# Patient Record
Sex: Female | Born: 1960 | Race: Black or African American | Hispanic: No | Smoking: Former smoker
Health system: Southern US, Community
[De-identification: ages and names within clinical notes are randomized; demographics above are authoritative.]

## PROBLEM LIST (undated history)

## (undated) DIAGNOSIS — I10 Essential (primary) hypertension: Secondary | ICD-10-CM

## (undated) DIAGNOSIS — E785 Hyperlipidemia, unspecified: Secondary | ICD-10-CM

## (undated) DIAGNOSIS — IMO0002 Reserved for concepts with insufficient information to code with codable children: Secondary | ICD-10-CM

## (undated) DIAGNOSIS — E041 Nontoxic single thyroid nodule: Secondary | ICD-10-CM

## (undated) DIAGNOSIS — E119 Type 2 diabetes mellitus without complications: Secondary | ICD-10-CM

## (undated) DIAGNOSIS — M329 Systemic lupus erythematosus, unspecified: Secondary | ICD-10-CM

## (undated) HISTORY — DX: Reserved for concepts with insufficient information to code with codable children: IMO0002

## (undated) HISTORY — DX: Systemic lupus erythematosus, unspecified: M32.9

## (undated) HISTORY — DX: Nontoxic single thyroid nodule: E04.1

## (undated) HISTORY — DX: Essential (primary) hypertension: I10

## (undated) HISTORY — DX: Type 2 diabetes mellitus without complications: E11.9

## (undated) HISTORY — DX: Hyperlipidemia, unspecified: E78.5

## (undated) HISTORY — PX: ABDOMINAL HYSTERECTOMY: SHX81

---

## 1997-11-03 ENCOUNTER — Other Ambulatory Visit: Admission: RE | Admit: 1997-11-03 | Discharge: 1997-11-03 | Payer: Self-pay | Admitting: Obstetrics and Gynecology

## 1998-09-01 ENCOUNTER — Ambulatory Visit (HOSPITAL_COMMUNITY): Admission: RE | Admit: 1998-09-01 | Discharge: 1998-09-01 | Payer: Self-pay | Admitting: Gastroenterology

## 1999-12-10 ENCOUNTER — Other Ambulatory Visit: Admission: RE | Admit: 1999-12-10 | Discharge: 1999-12-10 | Payer: Self-pay | Admitting: Obstetrics and Gynecology

## 1999-12-15 ENCOUNTER — Other Ambulatory Visit: Admission: RE | Admit: 1999-12-15 | Discharge: 1999-12-15 | Payer: Self-pay | Admitting: Obstetrics and Gynecology

## 1999-12-15 ENCOUNTER — Encounter (INDEPENDENT_AMBULATORY_CARE_PROVIDER_SITE_OTHER): Payer: Self-pay

## 2000-01-05 ENCOUNTER — Encounter (INDEPENDENT_AMBULATORY_CARE_PROVIDER_SITE_OTHER): Payer: Self-pay

## 2000-01-05 ENCOUNTER — Ambulatory Visit (HOSPITAL_COMMUNITY): Admission: RE | Admit: 2000-01-05 | Discharge: 2000-01-05 | Payer: Self-pay | Admitting: Obstetrics and Gynecology

## 2000-07-03 ENCOUNTER — Inpatient Hospital Stay (HOSPITAL_COMMUNITY): Admission: RE | Admit: 2000-07-03 | Discharge: 2000-07-06 | Payer: Self-pay | Admitting: Obstetrics and Gynecology

## 2000-07-03 ENCOUNTER — Encounter (INDEPENDENT_AMBULATORY_CARE_PROVIDER_SITE_OTHER): Payer: Self-pay | Admitting: Specialist

## 2000-07-05 ENCOUNTER — Encounter: Payer: Self-pay | Admitting: Obstetrics and Gynecology

## 2009-07-25 HISTORY — PX: SHOULDER SURGERY: SHX246

## 2010-03-25 DIAGNOSIS — E041 Nontoxic single thyroid nodule: Secondary | ICD-10-CM

## 2010-03-25 HISTORY — DX: Nontoxic single thyroid nodule: E04.1

## 2010-04-13 ENCOUNTER — Encounter: Admission: RE | Admit: 2010-04-13 | Discharge: 2010-04-13 | Payer: Self-pay | Admitting: Internal Medicine

## 2010-05-25 ENCOUNTER — Ambulatory Visit (HOSPITAL_COMMUNITY): Admission: RE | Admit: 2010-05-25 | Discharge: 2010-05-25 | Payer: Self-pay | Admitting: General Surgery

## 2010-10-06 LAB — DIFFERENTIAL
Basophils Absolute: 0 10*3/uL (ref 0.0–0.1)
Basophils Relative: 1 % (ref 0–1)
Eosinophils Absolute: 0.1 10*3/uL (ref 0.0–0.7)
Eosinophils Relative: 2 % (ref 0–5)
Lymphocytes Relative: 37 % (ref 12–46)
Lymphs Abs: 2.9 10*3/uL (ref 0.7–4.0)
Monocytes Absolute: 0.7 10*3/uL (ref 0.1–1.0)
Monocytes Relative: 9 % (ref 3–12)
Neutro Abs: 3.9 10*3/uL (ref 1.7–7.7)
Neutrophils Relative %: 51 % (ref 43–77)

## 2010-10-06 LAB — SURGICAL PCR SCREEN
MRSA, PCR: NEGATIVE
Staphylococcus aureus: NEGATIVE

## 2010-10-06 LAB — BASIC METABOLIC PANEL
BUN: 17 mg/dL (ref 6–23)
CO2: 33 mEq/L — ABNORMAL HIGH (ref 19–32)
Calcium: 9.6 mg/dL (ref 8.4–10.5)
Chloride: 97 mEq/L (ref 96–112)
Creatinine, Ser: 0.67 mg/dL (ref 0.4–1.2)
GFR calc Af Amer: >60 mL/min (ref >60)
GFR calc non Af Amer: >60 mL/min (ref >60)
Glucose, Bld: 109 mg/dL — ABNORMAL HIGH (ref 70–99)
Potassium: 3.4 mEq/L — ABNORMAL LOW (ref 3.5–5.1)
Sodium: 138 mEq/L (ref 135–145)

## 2010-10-06 LAB — CBC
HCT: 43.2 % (ref 36.0–46.0)
Hemoglobin: 15.1 g/dL — ABNORMAL HIGH (ref 12.0–15.0)
MCH: 35 pg — ABNORMAL HIGH (ref 26.0–34.0)
MCHC: 35 g/dL (ref 30.0–36.0)
MCV: 99.8 fL (ref 78.0–100.0)
Platelets: 331 10*3/uL (ref 150–400)
RBC: 4.32 MIL/uL (ref 3.87–5.11)
RDW: 13.1 % (ref 11.5–15.5)
WBC: 7.7 10*3/uL (ref 4.0–10.5)

## 2010-10-21 ENCOUNTER — Other Ambulatory Visit: Payer: Self-pay | Admitting: Internal Medicine

## 2010-10-21 DIAGNOSIS — E042 Nontoxic multinodular goiter: Secondary | ICD-10-CM

## 2010-12-10 NOTE — Op Note (Signed)
[facility]  Patient:    Alyssa Rhodes, Alyssa Rhodes                         MRN: [id number] Proc. Date: 01/05/00 Adm. Date:  [id number] Disc. Date: [id number] Attending:  Wandalee Ferdinand                           Operative Report  PREOPERATIVE DIAGNOSIS:       Uterine polyp versus fibroid.  Fibroid uterus. History of endometriosis.  Dysmenorrhea and menorrhagia.  POSTOPERATIVE DIAGNOSIS:      Multiple submucosal uterine fibroids.  OPERATION:                    Diagnostic/operative hysteroscopy with multiple myomectomy.  Dilatation and curettage.  SURGEON:                      Rudy Jew. Ashley Royalty, M.D.  ASSISTANT:  ANESTHESIA:                   General anesthesia.  ESTIMATED BLOOD LOSS:         50 cc.  COMPLICATIONS:                None.  PACKS AND DRAINS:             None.  DEFICIT:                      200 cc.  DESCRIPTION OF PROCEDURE:     The patient was taken to the operating room and placed in the dorsal supine position.  After adequate general endotracheal anesthesia was administered, she was placed in the lithotomy position and prepped and draped in the usual fashion for vaginal surgery.  A posterior weighted retractor was placed per vagina.  The anterior lip of the cervix was grasped with a single tooth tenaculum.  The uterus was gently sounded to approximately 8 cm. he cervix was then serially dilated to a size 29 [place] using [employer] dilators.  The  resectoscope was placed using Sorbitol as a distention medium.  The uterine cavity was thoroughly surveyed.  The tubal ostia was visualized.  The upper aspect of he uterus and fundus were without evidence of any submucosal fibroids or polyps. However, inferiorly, there were at least three fibroids noted.  One was on the posterior aspect of the lower uterine segment.  The other two were on the left nd right aspects of the lower uterine segment respectively.  Using the resectoscope at 110  watts cutting, 80 watts coagulation, waveform and the double right angle loupe, the fibroids were removed to a level flush with the remainder of the uterine cavity.  The specimens were submitted separately to pathology for histologic studies.  Hemostasis was obtained with the coagulation  waveform.  Attention was then turned to the dilatation and curettage.  Using a medium size  curet, the uterine cavity was successfully curetted.  First a four quadrant technique was used.  Then a therapeutic technique was used.  The resectoscope was once again placed.  Final attempts to maximize hemostasis ere conducted with the coagulation waveform.  Some technical difficulty was encountered with the instrument where the instrument had to be cleansed several times from water getting on the camera and on the operators side of the hysteroscope. However, these obstacles were fairly successfully overcome.  Hemostasis  was obtained and the procedure terminated.  It should also be mentioned during the course of the procedure that a lot of fluid was noted on the floor and it was finally established that approximately 200 cc fluid deficit was probably a best  estimate.  The patient was taken to the recovery room in excellent condition. DD:  01/05/00 TD:  01/08/00 Job: [id number]

## 2010-12-10 NOTE — Discharge Summary (Signed)
[facility] [place]  Patient:    Alyssa Rhodes, Alyssa Rhodes                         MRN: [id number] Adm. Date:  [id number] Disc. Date: [id number] Attending:  Wandalee Ferdinand                           Discharge Summary  DISCHARGE DIAGNOSES:          1. Fibroid uterus.                               2. Abnormal uterine bleeding refractory to                                  medical and surgical conservative management.  OPERATIONS/PROCEDURES:        Total abdominal hysterectomy.  CONSULTATIONS:                None.  DISCHARGE MEDICATIONS:        1. Tylox #20 one p.o. q.3-4h. p.r.n. pain.                               2. Ampicillin 500 mg p.o. q.i.d. x 4 days.  HISTORY AND PHYSICAL:         This is a 50 year old gravida 2, para 2 with a known fibroid uterus and refractory abnormal uterine bleeding dating back to May of 2001. The patient was admitted for hysterectomy.  For the remainder of the history and physical, please see the chart.  HOSPITAL COURSE:              The patient was admitted to [facility] [place].  Admission laboratory studies were drawn.  On July 03, 2000, she was taken to the operating room and underwent total abdominal hysterectomy.  The procedure was uncomplicated.  Postoperative course was similarly uncomplicated except for a low grade temperature of approximately 101.2 degrees F. on July 05, 2000.  Work-up was negative and the patient felt to have perhaps ___________ cellulitis.  On July 06, 2000, she was afebrile and felt stable for discharge home on p.o. ampicillin.  She was discharged home on that date afebrile and in satisfactory condition.  ACCESSORY CLINICAL FINDINGS:  Hemoglobin and hematocrit on admission were 13.2 and 38.4, respectively.  Repeat values obtained July 04, 2000, were 10.5 and 30.3, respectively.  White blood cell count on that date was 15,000. Repeat white blood cell count one day later revealed  a value of only 10,200. Serum pregnancy test was negative.  Urinalysis was essentially unremarkable. Type and Rh revealed B positive blood.  DISPOSITION:                  The patient was to return to [facility] in four to six weeks for postoperative evaluation. DD:  08/15/00 TD:  08/15/00 Job: [id number]

## 2010-12-10 NOTE — H&P (Signed)
[facility]  Patient:    Alyssa Rhodes, Alyssa Rhodes                           MRN: [id number] Adm. Date:  01/05/00 Attending:  Fayrene Fearing A. Ashley Royalty, M.D.                         History and Physical  HISTORY OF PRESENT ILLNESS:       This is a 50 year old, gravida 2, para 2, who presented Dec 10, 1999, complaining of dysmenorrhea and menorrhagia for three to four months duration.  Also, the patient complained of a six-month history of intermittent left lower quadrant discomfort.  She has a know fibroid uterus and history of endometriosis diagnosed a the time of laparoscopy December 1998.  A large adhesion was lysed at that time as well.                                    The patient returned to the office Dec 14, 1999, at which time she underwent endometrial biopsy as well as sonohistogram.  The sonohistogram revealed a large polyp arising in the lower uterine segment measuring approximately 2.8 cm in greatest diameter.  Secondly, there was a fibroid measuring less than 2 cm in greatest diameter arising from the left fundal aspect of the uterus.  The endometrial biopsy was negative.  The patient is admitted for diagnostic/operative hysteroscopy and D&C.  MEDICATIONS:                      None.  PAST MEDICAL HISTORY:             Medical: Negative.                                   Surgical:                                   1. Diagnostic/operative laparoscopy.                                      for endometriosis December 1998.                                   2. ______.  ALLERGIES:                        None.  FAMILY HISTORY:                   Noncontributory.  SOCIAL HISTORY:                   The patient smokes approximately one-half pack of cigarettes per day.  No significant alcohol usage.  REVIEW OF SYSTEMS:                Noncontributory.  PHYSICAL EXAMINATION:  GENERAL:                          Well-developed, well-nourished, pleasant black female  in no acute distress.  VITAL SIGNS:                      Stable.  SKIN:                             Warm and dry without lesions.  LYMPH:                            There are no supraclavicular, cervical or inguinal adenopathy.  HEENT:                            Normocephalic.  NECK:                             Supple without thyromegaly.  CHEST:                            Lungs are clear.  CARDIAC:                          Regular rate and rhythm without murmurs, gallops, or rubs.  BREASTS:                          Exam deferred.  ABDOMEN:                          Soft, nontender, without masses or organomegaly.  Bowel sounds are active.  MUSCULOSKELETAL:                  Full range of motion without edema, cyanosis, or CVA tenderness.  PELVIC:                           Examination deferred until examination under anesthesia.  IMPRESSION:                       1. Fibroid uterus.                                   2. History of endometriosis.                                   3. Dysmenorrhea and menorrhagia, possibly                                      secondary to #1.                                   4. Intrauterine polyp and possibly                                      separate fibroid at sonohistogram.  5. Intermittent left lower quadrant                                      discomfort, possibly secondary to #1                                      or #4.  PLAN:                             Diagnostic/operative hysteroscopy, dilatation and curettage.  Risks, benefits, alternatives were fully discussed with the patient.  The patient states she understands and accepts.  Questions invited and answered.DD:  01/05/00 TD:  01/05/00 Job: [id number]

## 2010-12-10 NOTE — H&P (Signed)
[facility]  Patient:    Alyssa Rhodes, Alyssa Rhodes                           MRN: [id number] Adm. Date:  07/03/00 Attending:  Fayrene Fearing A. Ashley Royalty, M.D.                         History and Physical  HISTORY OF PRESENT ILLNESS:   This is a 50 year old, gravida 2, para 2 with a known fibroid uterus and refractory abnormal uterine bleeding dating back to May 2001. The patient has been treated with sonohysterogram, endometrial biopsy, diagnostic/operative hysteroscopy, dilatation and curettage along with Megace therapy without relief. She desires no additional children and would like definitive therapy in the form of abdominal hysterectomy. Ultrasound was obtained Dec 14, 1999 and revealed the uterus to be approximately 9 x 7 x 6 cm with multiple small uterine fibroids.  MEDICATIONS:                  Megace.  PAST MEDICAL HISTORY:         Medical negative.  PAST SURGICAL HISTORY:        1. Diagnostic laparoscopy, 1998 with                                  endometriosis.                               2. Bilateral tubal sterilization.                               3. Cesarean section.  ALLERGIES:                    None.  FAMILY HISTORY:               Noncontributory.  SOCIAL HISTORY:               The patient smokes 10 cigarettes per day. The patient denies significant alcohol.  REVIEW OF SYSTEMS:            Noncontributory.  PHYSICAL EXAMINATION:  GENERAL:                      Well-developed, well-nourished, pleasant, black female in no acute distress.  VITAL SIGNS:                  Afebrile, Vital signs stable.  SKIN:                         Warm and dry without lesions.  ______:                      There is supraclavicular, cervical or inguinal adenopathy.  HEENT:                        Normocephalic.  NECK:                         Supple without thyromegaly.  CHEST:  Lungs are clear.  CARDIAC:                      Regular  rate and rhythm without murmur, gallop or rub.  BREASTS:                      Deferred.  ABDOMEN:                      Soft, nontender without masses or organomegaly. Bowel sounds are active.  MUSCULOSKELETAL:              Revealed ______ space without edema, cyanosis or CVA tenderness.  PELVIC:                       Deferred under examination under anesthesia.  IMPRESSION:                   1. Fibroid uterus.                               2. Abnormal uterine bleeding refractory to                                  medical and conservative surgical management.                               3. Desire for no additional children.  PLAN:                         Total abdominal hysterectomy. The risks, benefits, complications and alternatives were discussed with the patient. Possible need for unilateral or bilateral salpingo-oophorectomy was discussed and accepted. In the latter event, the patient understands she would need to take hormone replacement therapy potentially for the indefinite future and agrees to same. Questions were answered. DD:  07/03/00 TD:  07/03/00 Job: [id number]

## 2010-12-10 NOTE — Op Note (Signed)
[facility]  Patient:    Alyssa Rhodes, Alyssa Rhodes                           MRN: [id number] Proc. Date: 07/03/00 Attending:  Fayrene Fearing A. Ashley Royalty, M.D.                           Operative Report  PREOPERATIVE DIAGNOSES:       1. Fibroid uterus.                               2. Endometriosis.                               3. Abnormal uterine bleeding - refractory to                                  medical and conservative surgical management.  POSTOPERATIVE DIAGNOSES:      1. Fibroid uterus.                               2. Endometriosis.                               3. Abnormal uterine bleeding - refractory to                                  medical and conservative surgical management.                               4. Pathology pending.  OPERATION:                    Total abdominal hysterectomy.  SURGEON:                      Rudy Jew. Ashley Royalty, M.D.  ASSISTANT:                    Georgina Peer, M.D.  ANESTHESIA:                   General anesthesia.  ESTIMATED BLOOD LOSS:         100 cc.  COMPLICATIONS:                None.  PACKS AND DRAINS:             Foley catheter.  COUNTS:                       Sponge, needle and instrument counts were                               reported as correct x 2.  DESCRIPTION OF PROCEDURE:     The patient was taken to the operating room and placed in the dorsal supine position.  After adequate general anesthesia was administered, she was prepped and draped in the usual manner for abdominal  and vaginal surgery.  A Pfannenstiel incision was made through the patients old skin incision.  The subcutaneous tissues were sharply dissected down to the fascia. The fascia was incised with the knife and extended laterally with Mayo scissors.  The peritoneum was elevated and entered atraumatically with Metzenbaum scissors.  The incision was extended longitudinally.  There were some omental adhesions to the anterior abdominal  wall.  These were isolated, clamped, cut and secured with #1 chromic catgut with free ties.  The upper abdomen was explored.  The kidneys were normal bilaterally.  The liver surface was smooth.  There was no para-aortic adenopathy.  Balfour retractor was then placed.  The uterus was identified.  It was multinodular, numerous fibroids noted subserosally.  The fallopian tubes and ovaries were normal bilaterally. There was a small focus of endometriosis on the posterior left uterosacral ligament area.  The upper abdomen was packed off.  The round ligaments were then clamped, cut and secured with #1 chromic catgut.  A bladder flap was created anteriorly by incising the anterior uterine serosa and sharply and bluntly dissecting the bladder inferiorly.  The fallopian tubes, utero-ovarian ligament and utero-ovarian anastomosis was identified bilaterally, clamped, cut and secured with #1 chromic catgut, leaving the ovaries and fallopian tubes in situ.  The uterine vessels were then skeletonized.  The uterine vessels were then clamped, cut and doubly secured with #1 chromic catgut bilaterally.  Straight Heaney clamps were used to clamp on either side of the cervix paying careful attention to avoid injury to the urinary tract.  Each pedicle was clamped, cut and secured with #1 chromic catgut.  The uterosacral ligaments were isolated bilaterally, clamped, cut and secured with #1 chromic catgut. The pedicles were held.  The vagina was entered and the cervix excised with Satinsky scissors. The entire specimen of uterus and cervix was submitted in toto to pathology for histologic studies.  Next, a running locking circumferential suture of 2-0 Vicryl was placed on the vagina to provide hemostasis to the cuff.  Richardson angle sutures were placed with 0 chromic on either angle.  Hemostasis was noted. One additional suture was placed in the midline to obliterate any possibility of herniation.  Hemostasis  was noted.  The ureters were noted bilaterally to be well below the plane of dissection. Copious irrigation was accomplished.  Hemostasis was noted. The packs were removed.  Terressa Koyanagi was removed.  The fascia was then closed with 0 Vicryl in a running fashion.  The skin was closed with staples.  The patient tolerated the procedure extremely well.  At the conclusion of the procedure the urine was clear and copious. DD:  07/03/00 TD:  07/03/00 Job: [id number]

## 2011-04-21 ENCOUNTER — Other Ambulatory Visit: Payer: Self-pay | Admitting: Internal Medicine

## 2011-04-21 DIAGNOSIS — E041 Nontoxic single thyroid nodule: Secondary | ICD-10-CM

## 2011-04-27 ENCOUNTER — Other Ambulatory Visit: Payer: Self-pay

## 2011-05-04 ENCOUNTER — Other Ambulatory Visit: Payer: Self-pay

## 2011-05-04 ENCOUNTER — Ambulatory Visit
Admission: RE | Admit: 2011-05-04 | Discharge: 2011-05-04 | Disposition: A | Discharge status: Home / Self Care | Payer: BC Managed Care – PPO | Source: Ambulatory Visit | Attending: Internal Medicine | Admitting: Internal Medicine

## 2011-05-04 DIAGNOSIS — E041 Nontoxic single thyroid nodule: Secondary | ICD-10-CM

## 2011-05-09 ENCOUNTER — Other Ambulatory Visit: Payer: Self-pay | Admitting: Internal Medicine

## 2011-05-09 DIAGNOSIS — E041 Nontoxic single thyroid nodule: Secondary | ICD-10-CM

## 2011-05-18 ENCOUNTER — Other Ambulatory Visit (HOSPITAL_COMMUNITY)
Admission: RE | Admit: 2011-05-18 | Discharge: 2011-05-18 | Disposition: A | Discharge status: Home / Self Care | Payer: BC Managed Care – PPO | Source: Ambulatory Visit | Attending: Interventional Radiology | Admitting: Interventional Radiology

## 2011-05-18 ENCOUNTER — Ambulatory Visit
Admission: RE | Admit: 2011-05-18 | Discharge: 2011-05-18 | Disposition: A | Discharge status: Home / Self Care | Payer: BC Managed Care – PPO | Source: Ambulatory Visit | Attending: Internal Medicine | Admitting: Internal Medicine

## 2011-05-18 DIAGNOSIS — E041 Nontoxic single thyroid nodule: Secondary | ICD-10-CM

## 2011-05-18 DIAGNOSIS — E049 Nontoxic goiter, unspecified: Secondary | ICD-10-CM | POA: Insufficient documentation

## 2011-08-08 ENCOUNTER — Encounter: Payer: Self-pay | Admitting: Internal Medicine

## 2011-09-12 ENCOUNTER — Other Ambulatory Visit: Payer: Self-pay | Admitting: Family Medicine

## 2011-09-12 MED ORDER — BISOPROLOL-HYDROCHLOROTHIAZIDE 5-6.25 MG PO TABS: 1.0000 | ORAL_TABLET | Freq: Every day | ORAL | Status: DC

## 2011-10-05 ENCOUNTER — Ambulatory Visit: Payer: Self-pay | Admitting: Internal Medicine

## 2011-10-06 ENCOUNTER — Other Ambulatory Visit: Payer: Self-pay | Admitting: *Deleted

## 2011-10-06 MED ORDER — CLONIDINE HCL 0.1 MG PO TABS: 0.1000 mg | ORAL_TABLET | Freq: Two times a day (BID) | ORAL | Status: DC

## 2011-10-11 ENCOUNTER — Ambulatory Visit: Payer: Self-pay | Admitting: Family Medicine

## 2011-10-12 ENCOUNTER — Ambulatory Visit: Payer: Self-pay | Admitting: Family Medicine

## 2011-10-18 ENCOUNTER — Encounter: Payer: Self-pay | Admitting: Family Medicine

## 2011-10-18 ENCOUNTER — Ambulatory Visit (INDEPENDENT_AMBULATORY_CARE_PROVIDER_SITE_OTHER): Payer: BC Managed Care – PPO | Admitting: Family Medicine

## 2011-10-18 VITALS — BP 138/84 | HR 73 | Temp 97.3°F | Resp 16 | Ht 61.5 in | Wt 118.6 lb

## 2011-10-18 DIAGNOSIS — Z720 Tobacco use: Secondary | ICD-10-CM

## 2011-10-18 DIAGNOSIS — Z13220 Encounter for screening for lipoid disorders: Secondary | ICD-10-CM

## 2011-10-18 DIAGNOSIS — F17200 Nicotine dependence, unspecified, uncomplicated: Secondary | ICD-10-CM

## 2011-10-18 DIAGNOSIS — I10 Essential (primary) hypertension: Secondary | ICD-10-CM | POA: Insufficient documentation

## 2011-10-18 MED ORDER — LISINOPRIL 20 MG PO TABS: 20.0000 mg | ORAL_TABLET | Freq: Every day | ORAL | Status: DC

## 2011-10-18 MED ORDER — BISOPROLOL-HYDROCHLOROTHIAZIDE 5-6.25 MG PO TABS: 1.0000 | ORAL_TABLET | Freq: Every day | ORAL | Status: DC

## 2011-10-18 MED ORDER — CLONIDINE HCL 0.1 MG PO TABS: 0.1000 mg | ORAL_TABLET | Freq: Two times a day (BID) | ORAL | Status: DC

## 2011-10-18 NOTE — Patient Instructions (Addendum)

## 2011-10-19 ENCOUNTER — Encounter: Payer: Self-pay | Admitting: Family Medicine

## 2011-10-19 NOTE — Progress Notes (Signed)
  Subjective:    Patient ID: Alyssa Rhodes, female    DOB: July 30, 1960, 51 y.o.   MRN: [id number]  HPI This 51 y.o.AA female has well-controlled HTN and is here for follow-up and medication refills.  She denies any medication side effects, chest pain, palpitations, dizziness or lightheadedness.  She was prescribed Clonidine in November 2012 for treatment of peri-menopausal symptoms and  she notes less problems with hot flashes. She continues to smoke despite having a history of emphysema.  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 Labs done in   September 2012 (BMET, TFTs) were normal.                                                                                                                                                                                 .Review of Systems  Constitutional: Negative.   Eyes: Negative for visual disturbance.  Respiratory: Positive for shortness of breath. Negative for cough, chest tightness and wheezing.   Cardiovascular: Negative for chest pain and palpitations.  Neurological: Negative for dizziness, weakness, light-headedness and headaches.  All other systems reviewed and are negative.          Objective:   Physical Exam  Vitals reviewed. Constitutional: She is oriented to person, place, and time. She appears well-developed and well-nourished. No distress.  HENT:  Head: Normocephalic and atraumatic.  Eyes: EOM are normal. No scleral icterus.  Cardiovascular: Normal rate and regular rhythm.   Pulmonary/Chest: Effort normal. No respiratory distress.  Neurological: She is alert and oriented to person, place, and time. No cranial nerve deficit.  Psychiatric: She has a normal mood and affect. Her behavior is normal. Judgment and thought content normal.          Assessment & Plan:   1. HTN (hypertension)  Comprehensive metabolic panel Continue current medications- RFs for Generic Ziac 5-6.25 mg,  Clonidine 0.1 mg and lisinopril 20 mg (all 90- Day supply with RFs)  2. Need for lipid screening  Lipid panel  3. Tobacco user  Pt not interested in cessation at this time

## 2011-10-30 IMAGING — US US THYROID BIOPSY
1 series · 6 of 6 positions shown · non-contrast
Comparison: Thyroid ultrasound dated 05/04/2011

CLINICAL DATA: Solid right thyroid nodule measuring 1.2 cm in
diameter and containing microcalcifications.

ULTRASOUND GUIDED NEEDLE ASPIRATE BIOPSY OF THE THYROID GLAND

[Series 1: us thyroid biopsy · 0.05mm/px · 6 acquisitions, 6 frames shown]
[im 1/6]
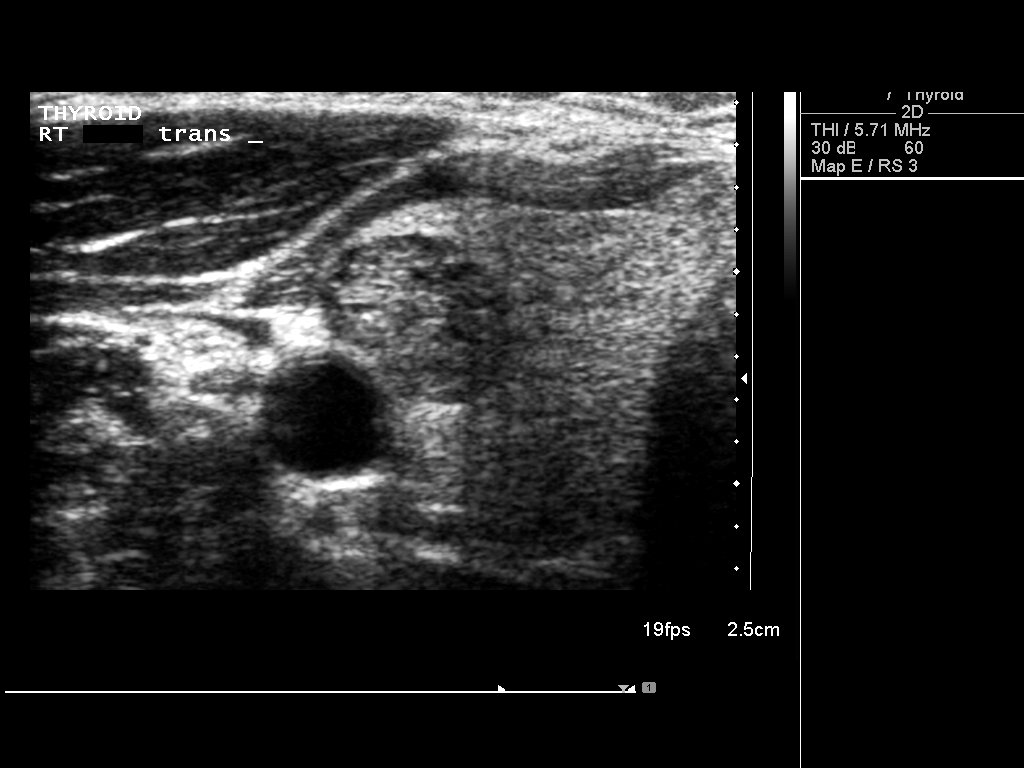
[im 2/6]
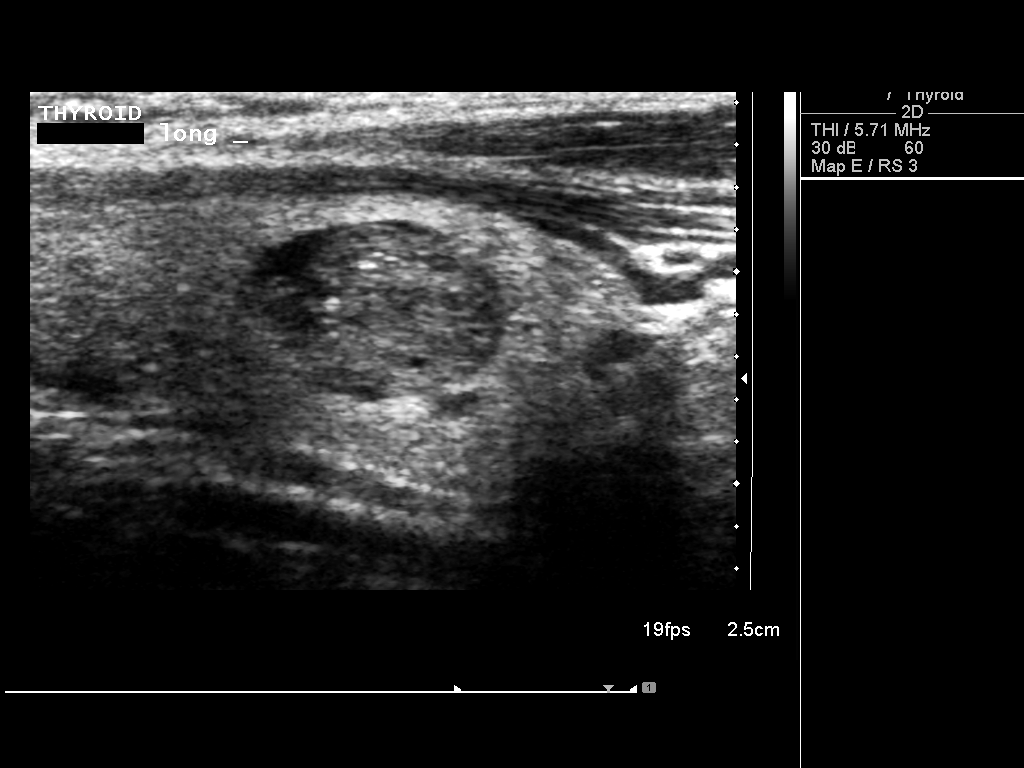
[im 3/6]
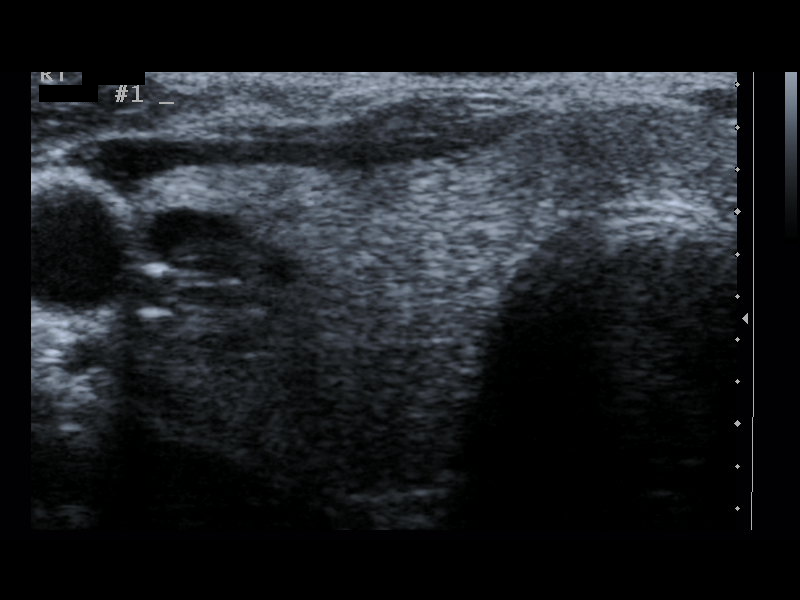
[im 4/6]
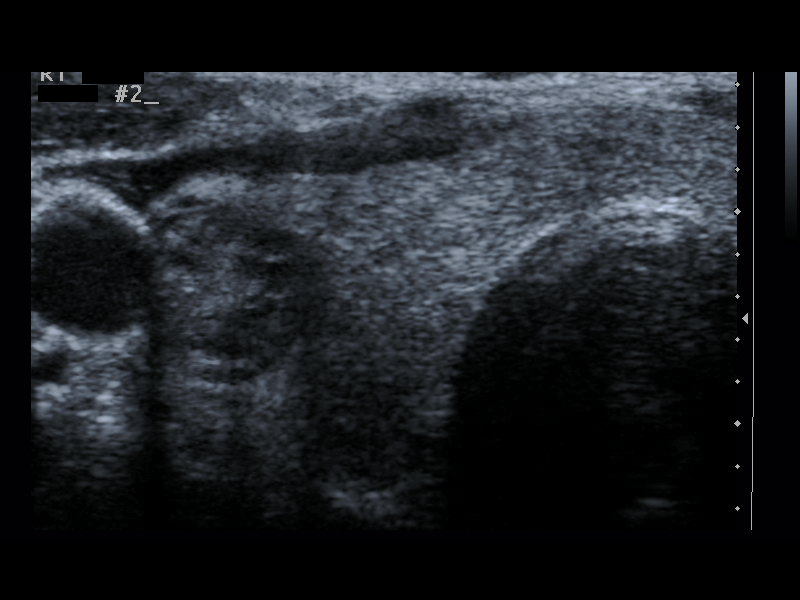
[im 5/6]
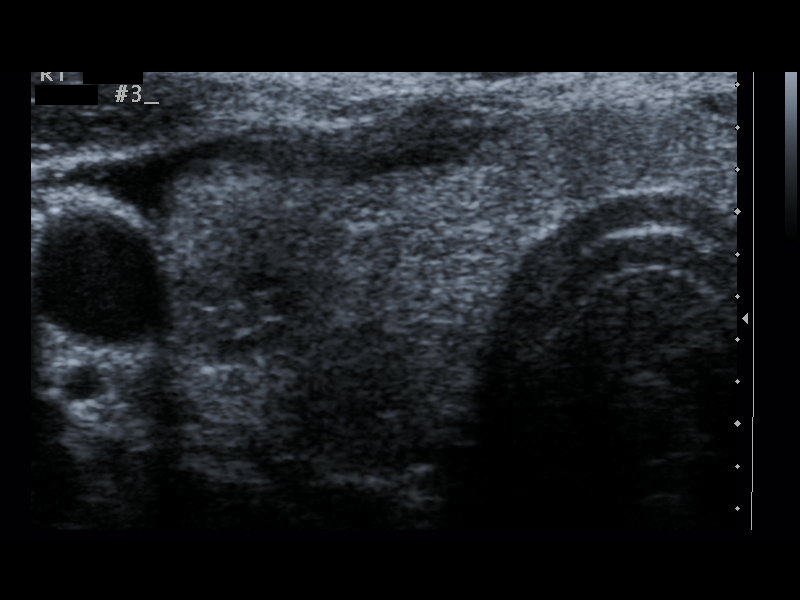
[im 6/6]
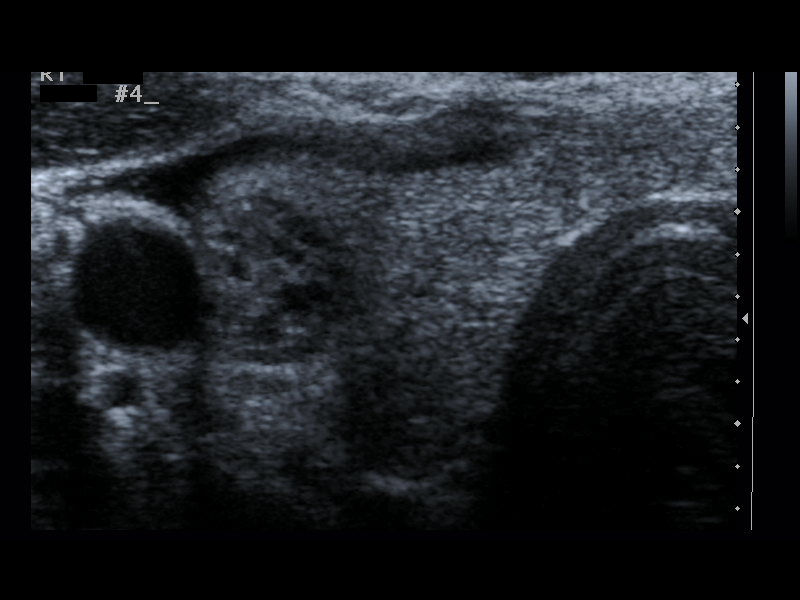

[6 of 6 positions shown; findings below may reference images not displayed]

Thyroid biopsy was thoroughly discussed with the patient and
questions were answered.  The benefits, risks, alternatives, and
complications were also discussed.  The patient understands and
wishes to proceed with the procedure.  Written consent was
obtained.

Ultrasound was performed to localize and mark an adequate site for
the biopsy.  The patient was then prepped and draped in a normal
sterile fashion.  Local anesthesia was provided with 1% lidocaine.
Using direct ultrasound guidance, 4 passes were made using 25 gauge
needles into the nodule within the right lobe of the thyroid.
Ultrasound was used to confirm needle placements on all occasions.
Specimens were sent to Pathology for analysis.

Complications:  None
FINDINGS: Needle samples were obtained directly in the focal right
thyroid nodule.
IMPRESSION: Ultrasound guided needle aspirate biopsy performed of the right
thyroid nodule.

## 2012-06-05 ENCOUNTER — Other Ambulatory Visit: Payer: Self-pay | Admitting: *Deleted

## 2012-06-05 MED ORDER — LISINOPRIL 20 MG PO TABS: 20.0000 mg | ORAL_TABLET | Freq: Every day | ORAL | Status: DC

## 2012-06-05 MED ORDER — BISOPROLOL-HYDROCHLOROTHIAZIDE 5-6.25 MG PO TABS: 1.0000 | ORAL_TABLET | Freq: Every day | ORAL | Status: DC

## 2012-09-25 ENCOUNTER — Other Ambulatory Visit: Payer: Self-pay | Admitting: Physician Assistant

## 2012-09-25 NOTE — Telephone Encounter (Signed)
Last seen March 2013, needs ov labs

## 2012-10-01 ENCOUNTER — Other Ambulatory Visit: Payer: Self-pay | Admitting: Physician Assistant

## 2012-10-31 ENCOUNTER — Other Ambulatory Visit: Payer: Self-pay | Admitting: Physician Assistant

## 2013-01-22 ENCOUNTER — Ambulatory Visit: Payer: BC Managed Care – PPO | Admitting: Family Medicine

## 2013-10-05 ENCOUNTER — Ambulatory Visit (INDEPENDENT_AMBULATORY_CARE_PROVIDER_SITE_OTHER): Payer: BC Managed Care – PPO | Admitting: Physician Assistant

## 2013-10-05 VITALS — BP 140/78 | HR 91 | Temp 98.5°F | Resp 17 | Ht 62.25 in | Wt 125.5 lb

## 2013-10-05 DIAGNOSIS — T485X5A Adverse effect of other anti-common-cold drugs, initial encounter: Secondary | ICD-10-CM

## 2013-10-05 DIAGNOSIS — J310 Chronic rhinitis: Secondary | ICD-10-CM

## 2013-10-05 DIAGNOSIS — J329 Chronic sinusitis, unspecified: Secondary | ICD-10-CM

## 2013-10-05 MED ORDER — AMOXICILLIN 875 MG PO TABS: 1750.0000 mg | ORAL_TABLET | Freq: Two times a day (BID) | ORAL | Status: DC

## 2013-10-05 MED ORDER — IPRATROPIUM BROMIDE 0.03 % NA SOLN: 2.0000 | Freq: Two times a day (BID) | NASAL | Status: DC

## 2013-10-05 NOTE — Patient Instructions (Signed)
STOP using afrin.  Use claritin or zyrtec daily.  Take mucinex 12 hour per box directions.  Sinusitis Sinusitis is redness, soreness, and swelling (inflammation) of the paranasal sinuses. Paranasal sinuses are air pockets within the bones of your face (beneath the eyes, the middle of the forehead, or above the eyes). In healthy paranasal sinuses, mucus is able to drain out, and air is able to circulate through them by way of your nose. However, when your paranasal sinuses are inflamed, mucus and air can become trapped. This can allow bacteria and other germs to grow and cause infection. Sinusitis can develop quickly and last only a short time (acute) or continue over a long period (chronic). Sinusitis that lasts for more than 12 weeks is considered chronic.  CAUSES  Causes of sinusitis include:  Allergies.  Structural abnormalities, such as displacement of the cartilage that separates your nostrils (deviated septum), which can decrease the air flow through your nose and sinuses and affect sinus drainage.  Functional abnormalities, such as when the small hairs (cilia) that line your sinuses and help remove mucus do not work properly or are not present. SYMPTOMS  Symptoms of acute and chronic sinusitis are the same. The primary symptoms are pain and pressure around the affected sinuses. Other symptoms include:  Upper toothache.  Earache.  Headache.  Bad breath.  Decreased sense of smell and taste.  A cough, which worsens when you are lying flat.  Fatigue.  Fever.  Thick drainage from your nose, which often is green and may contain pus (purulent).  Swelling and warmth over the affected sinuses. DIAGNOSIS  Your caregiver will perform a physical exam. During the exam, your caregiver may:  Look in your nose for signs of abnormal growths in your nostrils (nasal polyps).  Tap over the affected sinus to check for signs of infection.  View the inside of your sinuses (endoscopy)  with a special imaging device with a light attached (endoscope), which is inserted into your sinuses. If your caregiver suspects that you have chronic sinusitis, one or more of the following tests may be recommended:  Allergy tests.  Nasal culture A sample of mucus is taken from your nose and sent to a lab and screened for bacteria.  Nasal cytology A sample of mucus is taken from your nose and examined by your caregiver to determine if your sinusitis is related to an allergy. TREATMENT  Most cases of acute sinusitis are related to a viral infection and will resolve on their own within 10 days. Sometimes medicines are prescribed to help relieve symptoms (pain medicine, decongestants, nasal steroid sprays, or saline sprays).  However, for sinusitis related to a bacterial infection, your caregiver will prescribe antibiotic medicines. These are medicines that will help kill the bacteria causing the infection.  Rarely, sinusitis is caused by a fungal infection. In theses cases, your caregiver will prescribe antifungal medicine. For some cases of chronic sinusitis, surgery is needed. Generally, these are cases in which sinusitis recurs more than 3 times per year, despite other treatments. HOME CARE INSTRUCTIONS   Drink plenty of water. Water helps thin the mucus so your sinuses can drain more easily.  Use a humidifier.  Inhale steam 3 to 4 times a day (for example, sit in the bathroom with the shower running).  Apply a warm, moist washcloth to your face 3 to 4 times a day, or as directed by your caregiver.  Use saline nasal sprays to help moisten and clean your sinuses.  Take  over-the-counter or prescription medicines for pain, discomfort, or fever only as directed by your caregiver. SEEK IMMEDIATE MEDICAL CARE IF:  You have increasing pain or severe headaches.  You have nausea, vomiting, or drowsiness.  You have swelling around your face.  You have vision problems.  You have a stiff  neck.  You have difficulty breathing. MAKE SURE YOU:   Understand these instructions.  Will watch your condition.  Will get help right away if you are not doing well or get worse. Document Released: 07/11/2005 Document Revised: 10/03/2011 Document Reviewed: 07/26/2011 [facility] Patient Information 2014 [place], [place].

## 2013-10-05 NOTE — Progress Notes (Signed)
   Subjective:    Patient ID: Alyssa Rhodes, female    DOB: 1960-08-25, 53 y.o.   MRN: [id number]  HPI Patient presents with a 2 week history of thin and clear runny nose, nasal congestion interfering with sleeping and eating, ear pressure, eyes slightly matted in the morning with excessive tearing during the day, and sneezing.  She denies fever, chills, sweats, fatigue, eye itching, eye redness, vision change, ear pain, sinus pain, post nasal drip, cough, sore throat, shortness of breath or wheezing.  She has taken allegra or claritin, tylenol night-time, saline nose spray, and 12 hr afrin.  The Afrin provided some temporary relief.  She has been taking the afrin for about 2 weeks. Review of Systems  Constitutional: Negative for fever, chills, diaphoresis and fatigue.  HENT: Positive for congestion, rhinorrhea, sinus pressure and sneezing. Negative for ear pain, postnasal drip and sore throat.   Eyes: Positive for discharge (slight matting in the mornings with excessive tearing during the day). Negative for photophobia, pain, redness, itching and visual disturbance.  Respiratory: Negative for cough, shortness of breath and wheezing.   Musculoskeletal: Negative for myalgias.      Objective:   Physical Exam  Constitutional: She is oriented to person, place, and time. She appears well-developed and well-nourished. No distress.  HENT:  Head: Normocephalic and atraumatic.  Right Ear: Tympanic membrane normal.  Left Ear: Tympanic membrane normal.  Nose: Mucosal edema and rhinorrhea present. Right sinus exhibits no maxillary sinus tenderness and no frontal sinus tenderness. Left sinus exhibits no maxillary sinus tenderness and no frontal sinus tenderness.  Mouth/Throat: Posterior oropharyngeal erythema present. No oropharyngeal exudate.  Eyes: Conjunctivae and EOM are normal. Pupils are equal, round, and reactive to light. Right eye exhibits discharge (clear). Left eye exhibits discharge (clear). No  scleral icterus.  Clear tears  Neck: Normal range of motion. Neck supple. No thyromegaly present.  Cardiovascular: Normal rate, regular rhythm and normal heart sounds.  Exam reveals no gallop and no friction rub.   No murmur heard. Pulmonary/Chest: Effort normal and breath sounds normal. No respiratory distress (mouth breathing due to nasal congestion). She has no wheezes. She has no rhonchi. She has no rales.  Lymphadenopathy:       Head (right side): No submandibular, no tonsillar, no preauricular, no posterior auricular and no occipital adenopathy present.       Head (left side): No submandibular, no tonsillar, no preauricular, no posterior auricular and no occipital adenopathy present.    She has no cervical adenopathy.  Neurological: She is alert and oriented to person, place, and time.  Skin: Skin is warm and dry.  Psychiatric: She has a normal mood and affect. Her behavior is normal. Judgment and thought content normal.      Assessment & Plan:   1. Sinusitis Likely due to length of illness and smoking.  Will treat symptoms.  Stop nasal decongestant spray.  Counseled on smoking cessation.   - ipratropium (ATROVENT) 0.03 % nasal spray; Place 2 sprays into both nostrils 2 (two) times daily.  Dispense: 30 mL; Refill: 0 - amoxicillin (AMOXIL) 875 MG tablet; Take 2 tablets (1,750 mg total) by mouth 2 (two) times daily.  Dispense: 20 tablet; Refill: 0  2. Rhinitis medicamentosa Due to overuse of nasal decongestant spray.

## 2013-10-05 NOTE — Progress Notes (Signed)
I have examined this patient along with the student and agree.  

## 2015-08-11 ENCOUNTER — Ambulatory Visit (INDEPENDENT_AMBULATORY_CARE_PROVIDER_SITE_OTHER): Payer: BLUE CROSS/BLUE SHIELD | Admitting: Family Medicine

## 2015-08-11 ENCOUNTER — Encounter: Payer: Self-pay | Admitting: Family Medicine

## 2015-08-11 VITALS — BP 210/118 | HR 101 | Temp 98.5°F | Resp 18 | Ht 62.0 in | Wt 118.0 lb

## 2015-08-11 DIAGNOSIS — Z1211 Encounter for screening for malignant neoplasm of colon: Secondary | ICD-10-CM

## 2015-08-11 DIAGNOSIS — I10 Essential (primary) hypertension: Secondary | ICD-10-CM | POA: Diagnosis not present

## 2015-08-11 DIAGNOSIS — M25439 Effusion, unspecified wrist: Secondary | ICD-10-CM

## 2015-08-11 DIAGNOSIS — Z1231 Encounter for screening mammogram for malignant neoplasm of breast: Secondary | ICD-10-CM

## 2015-08-11 DIAGNOSIS — M25532 Pain in left wrist: Secondary | ICD-10-CM

## 2015-08-11 DIAGNOSIS — M25531 Pain in right wrist: Secondary | ICD-10-CM

## 2015-08-11 MED ORDER — LOSARTAN POTASSIUM-HCTZ 50-12.5 MG PO TABS: 1.0000 | ORAL_TABLET | Freq: Every day | ORAL | Status: DC

## 2015-08-11 MED ORDER — AMLODIPINE BESYLATE 5 MG PO TABS: 5.0000 mg | ORAL_TABLET | Freq: Every day | ORAL | Status: DC

## 2015-08-11 NOTE — Patient Instructions (Addendum)
Please stop Alleve and ibuprofen- I am concerned it will damage your kidneys and raise your blood pressure Try slip on braces for your wrists- you can get these over the counter Please check your blood pressure a couple of times a week and keep a log to bring in to your next appointment I will call you about your blood tests and we can decide on further treatment and work up of your wrist pain.   How to Take Your Blood Pressure HOW DO I GET A BLOOD PRESSURE MACHINE?  You can buy an electronic home blood pressure machine at your local pharmacy. Insurance will sometimes cover the cost if you have a prescription.  Ask your doctor what type of machine is best for you. There are different machines for your arm and your wrist.  If you decide to buy a machine to check your blood pressure on your arm, first check the size of your arm so you can buy the right size cuff. To check the size of your arm:   Use a measuring tape that shows both inches and centimeters.   Wrap the measuring tape around the upper-middle part of your arm. You may need someone to help you measure.   Write down your arm measurement in both inches and centimeters.   To measure your blood pressure correctly, it is important to have the right size cuff.   If your arm is up to 13 inches (up to 34 centimeters), get an adult cuff size.  If your arm is 13 to 17 inches (35 to 44 centimeters), get a large adult cuff size.    If your arm is 17 to 20 inches (45 to 52 centimeters), get an adult thigh cuff.  WHAT DO THE NUMBERS MEAN?   There are two numbers that make up your blood pressure. For example: 120/80.  The first number (120 in our example) is called the "systolic pressure." It is a measure of the pressure in your blood vessels when your heart is pumping blood.  The second number (80 in our example) is called the "diastolic pressure." It is a measure of the pressure in your blood vessels when your heart is resting  between beats.  Your doctor will tell you what your blood pressure should be. WHAT SHOULD I DO BEFORE I CHECK MY BLOOD PRESSURE?   Try to rest or relax for at least 30 minutes before you check your blood pressure.  Do not smoke.  Do not have any drinks with caffeine, such as:  Soda.  Coffee.  Tea.  Check your blood pressure in a quiet room.  Sit down and stretch out your arm on a table. Keep your arm at about the level of your heart. Let your arm relax.  Make sure that your legs are not crossed. HOW DO I CHECK MY BLOOD PRESSURE?  Follow the directions that came with your machine.  Make sure you remove any tight-fitting clothing from your arm or wrist. Wrap the cuff around your upper arm or wrist. You should be able to fit a finger between the cuff and your arm. If you cannot fit a finger between the cuff and your arm, it is too tight and should be removed and rewrapped.  Some units require you to manually pump up the arm cuff.  Automatic units inflate the cuff when you press a button.  Cuff deflation is automatic in both models.  After the cuff is inflated, the unit measures your blood pressure  and pulse. The readings are shown on a monitor. Hold still and breathe normally while the cuff is inflated.  Getting a reading takes less than a minute.  Some models store readings in a memory. Some provide a printout of readings. If your machine does not store your readings, keep a written record.  Take readings with you to your next visit with your doctor.   This information is not intended to replace advice given to you by your health care provider. Make sure you discuss any questions you have with your health care provider.   Document Released: 06/23/2008 Document Revised: 08/01/2014 Document Reviewed: 09/05/2013 Elsevier Interactive Patient Education [employer].

## 2015-08-11 NOTE — Progress Notes (Signed)
Subjective:    Patient ID: Alyssa Rhodes, female    DOB: 1960/11/13, 55 y.o.   MRN: [id number]  HPI This is a pleasant 55 yo female who presents today for follow up of HTN. She  Is a widow. Her husband passed away Mar 18, 2014.   She has not had any medical care since March 2015. She was seen previously by Dr. Leward Quan and was on multiple medications to control her blood pressure. She took care of her husband prior to his death and admits to neglecting her own health. She checks her blood pressure at home and has been getting readings 180-200s/100s. She denies any headaches, blurred vision, dizziness, lightheadedness, chest pain, shortness of breath or palpitations. The patient has smoked for many years and is not interested in quitting.   She has noticed swelling and pain in both of her hands for about a month. Swelling somewhat improved today. She took Alleve 2 tablets every 8 hours until she ran out of a 50 tablet bottle then she started taking ibuprofen 2 tablets every 6-8 hours. Warm water helps her with ROM and stiffness in the morning. She uses a keyboard for her work. Occasional numbness and tingling in hands.   Past Medical History  Diagnosis Date  . Thyroid nodule 03/2010    aspiration  . Hypertension    Past Surgical History  Procedure Laterality Date  . Cesarean section    . Abdominal hysterectomy    . Shoulder surgery  2011    LEFT   Family History  Problem Relation Age of Onset  . Asthma Mother     doe not know mother well, sister advised her  . Diabetes Mother   . Heart disease Father     heart attack  . Hypertension Father    Social History  Substance Use Topics  . Smoking status: Current Every Day Smoker -- 1.00 packs/day    Types: Cigarettes  . Smokeless tobacco: None  . Alcohol Use: 0.0 oz/week    0 Standard drinks or equivalent per week     Comment: daily   Review of Systems No chest pain, no SOB, no headaches, no dizziness, no swelling. No abdominal pain, no  diarrhea, no constipation.     Objective:   Physical Exam  Constitutional: She is oriented to person, place, and time. She appears well-developed and well-nourished.  HENT:  Head: Normocephalic and atraumatic.  Eyes: Conjunctivae are normal.  Cardiovascular: Normal rate, regular rhythm and normal heart sounds.   Pulmonary/Chest: Effort normal and breath sounds normal.  Musculoskeletal:  Bilateral hands with full ROM, some generalized tenderness over dorsal surfaces bilaterally. No erythema, no edema.   Neurological: She is alert and oriented to person, place, and time.  Skin: Skin is warm and dry.  Psychiatric: She has a normal mood and affect. Her behavior is normal. Judgment and thought content normal.  Vitals reviewed.  BP 210/118 mmHg  Pulse 101  Temp(Src) 98.5 F (36.9 C)  Resp 18  Ht 5\' 2"  (1.575 m)  Wt 118 lb (53.524 kg)  BMI 21.58 kg/m2 Wt Readings from Last 3 Encounters:  08/11/15 118 lb (53.524 kg)  10/05/13 125 lb 8 oz (56.926 kg)  10/18/11 118 lb 9.6 oz (53.797 kg)       Assessment & Plan:  Discussed with Dr. Everlene Farrier 1. Visit for screening mammogram - MM Digital Screening; Future  2. Special screening for malignant neoplasms, colon - patient not interested in screening at this time  3.  Essential hypertension - discussed effect of NSAIDs on her blood pressure and encouraged her to stop NSAIDs and use acetaminophen instead.  - discussed importance of getting her blood pressure under control and having regular follow up medications and good medication compliance - encouraged smoking cessation - CBC - Lipid panel - TSH - COMPLETE METABOLIC PANEL WITH GFR - amLODipine (NORVASC) 5 MG tablet; Take 1 tablet (5 mg total) by mouth daily.  Dispense: 30 tablet; Refill: 1 - losartan-hydrochlorothiazide (HYZAAR) 50-12.5 MG tablet; Take 1 tablet by mouth daily.  Dispense: 30 tablet; Refill: 1 - consider future cardiology referral to evaluate EF  4. Pain in both wrists -  Ice/heat for comfort, can use some slip on supports while working for comfort - Arthritis Panel  5. Wrist swelling, unspecified laterality - Arthritis Panel  - follow up in 2 weeks  Clarene Reamer, FNP-BC  [facility], [facility]  08/13/2015 6:24 AM

## 2015-08-12 LAB — TSH: TSH: 0.796 u[IU]/mL (ref 0.350–4.500)

## 2015-08-12 LAB — CBC
HCT: 43.9 % (ref 36.0–46.0)
Hemoglobin: 15.3 g/dL — ABNORMAL HIGH (ref 12.0–15.0)
MCH: 33.1 pg (ref 26.0–34.0)
MCHC: 34.9 g/dL (ref 30.0–36.0)
MCV: 95 fL (ref 78.0–100.0)
MPV: 9.3 fL (ref 8.6–12.4)
Platelets: 381 10*3/uL (ref 150–400)
RBC: 4.62 MIL/uL (ref 3.87–5.11)
RDW: 14.2 % (ref 11.5–15.5)
WBC: 6.1 10*3/uL (ref 4.0–10.5)

## 2015-08-13 ENCOUNTER — Encounter: Payer: Self-pay | Admitting: Family Medicine

## 2015-08-13 LAB — COMPLETE METABOLIC PANEL WITH GFR
ALT: 30 U/L — ABNORMAL HIGH (ref 6–29)
AST: 30 U/L (ref 10–35)
Albumin: 4.3 g/dL (ref 3.6–5.1)
Alkaline Phosphatase: 62 U/L (ref 33–130)
BUN: 13 mg/dL (ref 7–25)
CO2: 27 mmol/L (ref 20–31)
Calcium: 10.2 mg/dL (ref 8.6–10.4)
Chloride: 104 mmol/L (ref 98–110)
Creat: 0.45 mg/dL — ABNORMAL LOW (ref 0.50–1.05)
GFR, Est African American: >89 mL/min (ref >=60)
GFR, Est Non African American: >89 mL/min (ref >=60)
Glucose, Bld: 94 mg/dL (ref 65–99)
Potassium: 3.7 mmol/L (ref 3.5–5.3)
Sodium: 138 mmol/L (ref 135–146)
Total Bilirubin: 0.4 mg/dL (ref 0.2–1.2)
Total Protein: 7.4 g/dL (ref 6.1–8.1)

## 2015-08-13 LAB — LIPID PANEL
Cholesterol: 185 mg/dL (ref 125–200)
HDL: 39 mg/dL — ABNORMAL LOW (ref >=46)
LDL Cholesterol: 74 mg/dL (ref <130)
Total CHOL/HDL Ratio: 4.7 Ratio (ref <=5.0)
Triglycerides: 361 mg/dL — ABNORMAL HIGH (ref <150)
VLDL: 72 mg/dL — ABNORMAL HIGH (ref <30)

## 2015-08-13 LAB — ANA: Anti Nuclear Antibody(ANA): POSITIVE — AB

## 2015-08-13 LAB — RHEUMATOID FACTOR: Rhuematoid fact SerPl-aCnc: 46 IU/mL — ABNORMAL HIGH (ref <=14)

## 2015-08-13 LAB — SEDIMENTATION RATE: Sed Rate: 1 mm/hr (ref 0–30)

## 2015-08-13 LAB — URIC ACID: Uric Acid, Serum: 3.9 mg/dL (ref 2.4–7.0)

## 2015-08-13 LAB — ANTI-NUCLEAR AB-TITER (ANA TITER): ANA Titer 1: >=1:1280 {titer} — ABNORMAL HIGH

## 2015-08-14 ENCOUNTER — Other Ambulatory Visit: Payer: Self-pay | Admitting: Family Medicine

## 2015-08-14 DIAGNOSIS — M25531 Pain in right wrist: Secondary | ICD-10-CM

## 2015-08-14 DIAGNOSIS — R768 Other specified abnormal immunological findings in serum: Secondary | ICD-10-CM

## 2015-08-14 DIAGNOSIS — M25532 Pain in left wrist: Principal | ICD-10-CM

## 2015-08-25 ENCOUNTER — Encounter: Payer: Self-pay | Admitting: Family Medicine

## 2015-08-25 ENCOUNTER — Ambulatory Visit (INDEPENDENT_AMBULATORY_CARE_PROVIDER_SITE_OTHER): Payer: BLUE CROSS/BLUE SHIELD | Admitting: Family Medicine

## 2015-08-25 VITALS — BP 128/74 | HR 105 | Temp 98.3°F | Resp 16 | Ht 62.0 in | Wt 117.2 lb

## 2015-08-25 DIAGNOSIS — Z1231 Encounter for screening mammogram for malignant neoplasm of breast: Secondary | ICD-10-CM

## 2015-08-25 DIAGNOSIS — F17200 Nicotine dependence, unspecified, uncomplicated: Secondary | ICD-10-CM

## 2015-08-25 DIAGNOSIS — I10 Essential (primary) hypertension: Secondary | ICD-10-CM

## 2015-08-25 DIAGNOSIS — Z23 Encounter for immunization: Secondary | ICD-10-CM

## 2015-08-25 NOTE — Progress Notes (Signed)
   Subjective:    Patient ID: Alyssa Rhodes, female    DOB: [birth year], 55 y.o.   MRN: [id number]  HPI This is a pleasant 55 yo female who presents today for follow up of HTN and hand/wrist pain.  She has been checking her blood pressure at home with readings running 150-170s/90-100s. She has tolerated going back on her blood pressure medication without side effects. She was seen at Dr. Melissa Noon office today and reports her blood pressure was normal.   She was diagnosed with rheumatoid arthritis and was given a course of prednisone today. She will start this tomorrow. She continues to have severe hand pain and difficulty with daily activities. She will follow up with Dr. Amil Amen after finishing prednisone course.   Past Medical History  Diagnosis Date  . Thyroid nodule 03/2010    aspiration  . Hypertension    Past Surgical History  Procedure Laterality Date  . Cesarean section    . Abdominal hysterectomy    . Shoulder surgery  2011    LEFT   Family History  Problem Relation Age of Onset  . Asthma Mother     doe not know mother well, sister advised her  . Diabetes Mother   . Heart disease Father     heart attack  . Hypertension Father    Social History  Substance Use Topics  . Smoking status: Current Every Day Smoker -- 1.00 packs/day    Types: Cigarettes  . Smokeless tobacco: None  . Alcohol Use: 0.0 oz/week    0 Standard drinks or equivalent per week     Comment: daily     Review of Systems No chest pain, no SOB, no headache, no dizziness/vertigo    Objective:   Physical Exam Physical Exam  Constitutional: Oriented to person, place, and time. She appears well-developed and well-nourished.  HENT:  Head: Normocephalic and atraumatic.  Eyes: Conjunctivae are normal.  Neck: Normal range of motion. Neck supple.  Cardiovascular: Normal rate, regular rhythm and normal heart sounds.   Pulmonary/Chest: Effort normal and breath sounds normal.  Musculoskeletal: Normal  range of motion.  Neurological: Alert and oriented to person, place, and time.  Skin: Skin is warm and dry.  Psychiatric: Normal mood and affect. Behavior is normal. Judgment and thought content normal.  Vitals reviewed.   BP 128/74 mmHg  Pulse 105  Temp(Src) 98.3 F (36.8 C) (Oral)  Resp 16  Ht 5\' 2"  (1.575 m)  Wt 117 lb 3.2 oz (53.162 kg)  BMI 21.43 kg/m2  SpO2 96%     Assessment & Plan:  1. Essential hypertension - compared patient's machine with automatic and manual cuffs in the office. Patient's cuff reading consistently higher- suggested she replace  - continue current meds - recheck in 3 months  2. Visit for screening mammogram - MM DIGITAL SCREENING BILATERAL; Future  3. Need for Tdap vaccination - Tdap vaccine greater than or equal to 7yo IM  4. Tobacco use disorder - encouraged smoking cessation   Clarene Reamer, FNP-BC  [facility], [place]  08/26/2015 9:01 PM

## 2015-08-25 NOTE — Patient Instructions (Addendum)
We recommend that you schedule a mammogram for breast cancer screening. Typically, you do not need a referral to do this. Please contact a local imaging center to schedule your mammogram.  [facility] ([place]) - [phone] or [phone]    Please call and make an appointment for your mammogram if you have not heard from the [place] by Friday.   Please take your blood pressure a couple of times a week and if it is more than 150 for the top number or 100 for the bottom number, please call and let me know.  Tomorrow morning, start taking your amlodipine in the morning.

## 2015-09-03 DIAGNOSIS — Z23 Encounter for immunization: Secondary | ICD-10-CM | POA: Diagnosis not present

## 2015-10-06 ENCOUNTER — Other Ambulatory Visit: Payer: Self-pay | Admitting: Family Medicine

## 2015-10-07 ENCOUNTER — Other Ambulatory Visit: Payer: Self-pay | Admitting: Family Medicine

## 2015-10-08 ENCOUNTER — Other Ambulatory Visit: Payer: Self-pay | Admitting: Family Medicine

## 2015-11-17 ENCOUNTER — Ambulatory Visit (INDEPENDENT_AMBULATORY_CARE_PROVIDER_SITE_OTHER): Payer: BLUE CROSS/BLUE SHIELD | Admitting: Family Medicine

## 2015-11-17 ENCOUNTER — Ambulatory Visit: Payer: BLUE CROSS/BLUE SHIELD | Admitting: Family Medicine

## 2015-11-17 ENCOUNTER — Encounter: Payer: Self-pay | Admitting: Family Medicine

## 2015-11-17 VITALS — BP 147/76 | HR 85 | Temp 99.1°F | Resp 16 | Ht 62.0 in | Wt 121.8 lb

## 2015-11-17 DIAGNOSIS — F17200 Nicotine dependence, unspecified, uncomplicated: Secondary | ICD-10-CM

## 2015-11-17 DIAGNOSIS — I10 Essential (primary) hypertension: Secondary | ICD-10-CM

## 2015-11-17 MED ORDER — AMLODIPINE BESYLATE 5 MG PO TABS: 5.0000 mg | ORAL_TABLET | Freq: Every day | ORAL | Status: DC

## 2015-11-17 MED ORDER — LOSARTAN POTASSIUM-HCTZ 50-12.5 MG PO TABS: 1.0000 | ORAL_TABLET | Freq: Every day | ORAL | Status: DC

## 2015-11-17 NOTE — Progress Notes (Signed)
   Subjective:    Patient ID: Alyssa Rhodes, female    DOB: [birth year], 55 y.o.   MRN: [id number]  HPI This is a pleasant 55 yo female who presents today for follow up of HTN. She has been doing well. She is checking her blood pressure at home and it has been running normal. No medication side effects. Smokes about 10 cigarettes a day.   She is taking plaquenil and prednisone (5mg ) for her rheumatoid arthritis and has had significant improvement.   Past Medical History  Diagnosis Date  . Thyroid nodule 03/2010    aspiration  . Hypertension    Past Surgical History  Procedure Laterality Date  . Cesarean section    . Abdominal hysterectomy    . Shoulder surgery  2011    LEFT   Family History  Problem Relation Age of Onset  . Asthma Mother     doe not know mother well, sister advised her  . Diabetes Mother   . Heart disease Father     heart attack  . Hypertension Father    Social History  Substance Use Topics  . Smoking status: Current Every Day Smoker -- 1.00 packs/day    Types: Cigarettes  . Smokeless tobacco: None  . Alcohol Use: 0.0 oz/week    0 Standard drinks or equivalent per week     Comment: daily      Review of Systems No chest pain, no SOB, no edema    Objective:   Physical Exam Physical Exam  Constitutional: Oriented to person, place, and time. She appears well-developed and well-nourished.  HENT:  Head: Normocephalic and atraumatic.  Eyes: Conjunctivae are normal.  Neck: Normal range of motion. Neck supple.  Cardiovascular: Normal rate, regular rhythm and normal heart sounds.   Pulmonary/Chest: Effort normal and breath sounds normal.  Musculoskeletal: Normal range of motion.  Neurological: Alert and oriented to person, place, and time.  Skin: Skin is warm and dry.  Psychiatric: Normal mood and affect. Behavior is normal. Judgment and thought content normal.  Vitals reviewed.     BP 147/76 mmHg  Pulse 85  Temp(Src) 99.1 F (37.3 C) (Oral)   Resp 16  Ht 5\' 2"  (1.575 m)  Wt 121 lb 12.8 oz (55.248 kg)  BMI 22.27 kg/m2 Wt Readings from Last 3 Encounters:  11/17/15 121 lb 12.8 oz (55.248 kg)  08/25/15 117 lb 3.2 oz (53.162 kg)  08/11/15 118 lb (53.524 kg)       Assessment & Plan:  1. Essential hypertension - greatly improved on current meds, she will continue to monitor at home - amLODipine (NORVASC) 5 MG tablet; Take 1 tablet (5 mg total) by mouth daily.  Dispense: 90 tablet; Refill: 1 - losartan-hydrochlorothiazide (HYZAAR) 50-12.5 MG tablet; Take 1 tablet by mouth daily.  Dispense: 90 tablet; Refill: 1  2. Tobacco use disorder - she has cut down on her cigarette consumption and I encouraged her to continue to reduce and consider quitting  - she has not had mammo, provided information for her to schedule  - follow up in 6 months  Clarene Reamer, FNP-BC  [facility], [place] [facility]  11/21/2015 6:50 AM

## 2015-11-17 NOTE — Patient Instructions (Addendum)
  We recommend that you schedule a mammogram for breast cancer screening.  [facility] ([place]) - [phone] or [phone]   Please call in September for an appointment in October for a follow up visit.  IF you received an x-ray today, you will receive an invoice from [facility]. Please contact [facility] at [phone] with questions or concerns regarding your invoice.   IF you received labwork today, you will receive an invoice from [employer]. Please contact [facility] at [phone] with questions or concerns regarding your invoice.   Our billing staff will not be able to assist you with questions regarding bills from these companies.  You will be contacted with the lab results as soon as they are available. The fastest way to get your results is to activate your My Chart account. Instructions are located on the last page of this paperwork. If you have not heard from [place] regarding the results in 2 weeks, please contact this office.

## 2015-12-10 DIAGNOSIS — M1990 Unspecified osteoarthritis, unspecified site: Secondary | ICD-10-CM | POA: Diagnosis not present

## 2015-12-10 DIAGNOSIS — R768 Other specified abnormal immunological findings in serum: Secondary | ICD-10-CM | POA: Diagnosis not present

## 2015-12-10 DIAGNOSIS — Z720 Tobacco use: Secondary | ICD-10-CM | POA: Diagnosis not present

## 2016-03-11 DIAGNOSIS — M1990 Unspecified osteoarthritis, unspecified site: Secondary | ICD-10-CM | POA: Diagnosis not present

## 2016-03-11 DIAGNOSIS — R768 Other specified abnormal immunological findings in serum: Secondary | ICD-10-CM | POA: Diagnosis not present

## 2016-03-11 DIAGNOSIS — Z720 Tobacco use: Secondary | ICD-10-CM | POA: Diagnosis not present

## 2016-04-27 NOTE — Progress Notes (Signed)
Subjective:    Patient ID: Alyssa Rhodes, female    DOB: 24-Jun-1961, 55 y.o.   MRN: [id number] Chief Complaint  Patient presents with  . Medication Refill    amlodipine, PLAQUENIL,and HYZAAR    HPI  Alyssa Rhodes is a 55 yo woman who is here for a 6 mo follow-up on her chronic medical conditions. This is my first time meeting this pt.  HTN:  Checks BP at home.  On amlodipine 5mg  and losartan-hctz 50-12.5 qd.  Usually runs 130s.   Tobacco abuse: Smoking 10 cigs/d. Sig cut down from prior but she has definitely increased.  RA: On plaquenil. Seeing rheumatology - on meloxicam.  HPL: Elev trig prior - not fasting. She is doing fish oil and asa.  Thyroid nodule: s/p FNA bx 04/2011 of right solid 1.2 cm nodule with microcalcifications showed benign goiter  Past Medical History:  Diagnosis Date  . Hypertension   . Thyroid nodule 03/2010   aspiration   Past Surgical History:  Procedure Laterality Date  . ABDOMINAL HYSTERECTOMY    . CESAREAN SECTION    . SHOULDER SURGERY  2011   LEFT   Current Outpatient Prescriptions on File Prior to Visit  Medication Sig Dispense Refill  . hydroxychloroquine (PLAQUENIL) 200 MG tablet   2  . Multiple Vitamin (MULTIVITAMIN) tablet Take 1 tablet by mouth daily.     No current facility-administered medications on file prior to visit.    Allergies  Allergen Reactions  . Codeine Nausea And Vomiting   Family History  Problem Relation Age of Onset  . Asthma Mother     doe not know mother well, sister advised her  . Diabetes Mother   . Heart disease Father     heart attack  . Hypertension Father    Social History   Social History  . Marital status: Married    Spouse name: N/A  . Number of children: N/A  . Years of education: N/A   Social History Main Topics  . Smoking status: Current Every Day Smoker    Packs/day: 1.00    Types: Cigarettes  . Smokeless tobacco: None  . Alcohol use 0.0 oz/week     Comment: daily  . Drug use: No  .  Sexual activity: Not Asked   Other Topics Concern  . None   Social History Narrative  . None    Review of Systems See hpi    Objective:   Physical Exam  Constitutional: She is oriented to person, place, and time. She appears well-developed and well-nourished. No distress.  HENT:  Head: Normocephalic and atraumatic.  Right Ear: External ear normal.  Left Ear: External ear normal.  Eyes: Conjunctivae are normal. No scleral icterus.  Neck: Normal range of motion. Neck supple. No thyromegaly present.  Cardiovascular: Normal rate, regular rhythm, normal heart sounds and intact distal pulses.   Pulmonary/Chest: Effort normal and breath sounds normal. No respiratory distress.  Musculoskeletal: She exhibits no edema.  Lymphadenopathy:    She has no cervical adenopathy.  Neurological: She is alert and oriented to person, place, and time.  Skin: Skin is warm and dry. She is not diaphoretic. No erythema.  Psychiatric: She has a normal mood and affect. Her behavior is normal.      BP 124/66 (BP Location: Left Arm, Patient Position: Sitting, Cuff Size: Normal)   Pulse (!) 102   Temp 98.4 F (36.9 C) (Oral)   Resp 16   Ht 5\' 2"  (1.575 m)  Wt 115 lb (52.2 kg)   SpO2 99%   BMI 21.03 kg/m      Assessment & Plan:  Make appt for CPE 1. Essential hypertension   2. Tobacco user   3. Rheumatoid arthritis with positive rheumatoid factor, involving unspecified site ([place])   4. Hypertriglyceridemia   5. Need for prophylactic vaccination and inoculation against influenza     Orders Placed This Encounter  Procedures  . Flu Vaccine QUAD 36+ mos IM  . Comprehensive metabolic panel    Order Specific Question:   Has the patient fasted?    Answer:   Yes    Meds ordered this encounter  Medications  . aspirin EC 81 MG tablet    Sig: Take 81 mg by mouth daily.  . vitamin C (ASCORBIC ACID) 500 MG tablet    Sig: Take 500 mg by mouth daily.  . Omega-3 Fatty Acids (FISH OIL PO)    Sig:  Take by mouth daily.  . meloxicam (MOBIC) 7.5 MG tablet    Refill:  1  . amLODipine (NORVASC) 5 MG tablet    Sig: Take 1 tablet (5 mg total) by mouth daily.    Dispense:  90 tablet    Refill:  3  . losartan-hydrochlorothiazide (HYZAAR) 50-12.5 MG tablet    Sig: Take 1 tablet by mouth daily.    Dispense:  90 tablet    Refill:  3    Office visit needed for refills     Delman Cheadle, M.D.  [facility] [address] [place], [place] [address] [phone] phone [phone] fax  05/24/16 11:47 PM  Results for orders placed or performed in visit on 04/28/16  Comprehensive metabolic panel  Result Value Ref Range   Sodium 139 135 - 146 mmol/L   Potassium 3.7 3.5 - 5.3 mmol/L   Chloride 99 98 - 110 mmol/L   CO2 28 20 - 31 mmol/L   Glucose, Bld 90 65 - 99 mg/dL   BUN 20 7 - 25 mg/dL   Creat 0.87 0.50 - 1.05 mg/dL   Total Bilirubin 0.4 0.2 - 1.2 mg/dL   Alkaline Phosphatase 61 33 - 130 U/L   AST 22 10 - 35 U/L   ALT 22 6 - 29 U/L   Total Protein 6.9 6.1 - 8.1 g/dL   Albumin 4.3 3.6 - 5.1 g/dL   Calcium 9.7 8.6 - 10.4 mg/dL

## 2016-04-28 ENCOUNTER — Encounter: Payer: Self-pay | Admitting: Family Medicine

## 2016-04-28 ENCOUNTER — Ambulatory Visit (INDEPENDENT_AMBULATORY_CARE_PROVIDER_SITE_OTHER): Payer: BLUE CROSS/BLUE SHIELD | Admitting: Family Medicine

## 2016-04-28 VITALS — BP 124/66 | HR 102 | Temp 98.4°F | Resp 16 | Ht 62.0 in | Wt 115.0 lb

## 2016-04-28 DIAGNOSIS — I10 Essential (primary) hypertension: Secondary | ICD-10-CM

## 2016-04-28 DIAGNOSIS — Z720 Tobacco use: Secondary | ICD-10-CM | POA: Diagnosis not present

## 2016-04-28 DIAGNOSIS — M059 Rheumatoid arthritis with rheumatoid factor, unspecified: Secondary | ICD-10-CM | POA: Diagnosis not present

## 2016-04-28 DIAGNOSIS — Z23 Encounter for immunization: Secondary | ICD-10-CM

## 2016-04-28 DIAGNOSIS — E781 Pure hyperglyceridemia: Secondary | ICD-10-CM | POA: Diagnosis not present

## 2016-04-28 MED ORDER — AMLODIPINE BESYLATE 5 MG PO TABS: 5.0000 mg | ORAL_TABLET | Freq: Every day | ORAL | 3 refills | Status: DC

## 2016-04-28 MED ORDER — LOSARTAN POTASSIUM-HCTZ 50-12.5 MG PO TABS: 1.0000 | ORAL_TABLET | Freq: Every day | ORAL | 3 refills | Status: DC

## 2016-04-28 NOTE — Patient Instructions (Addendum)
[place] is now offering annual lung cancer screening by low-dose CT scan.  This is covered for qualifying patients and your insurance will be checked before the procedure.  Call the lung cancer screening nurse navigators at [phone] to learn more about this and get scheduled.  Consider trying circumin (the active ingredient in tumeric) to help decrease joint stiffness in inflammation. Sometimes really high doses of glucosamine-condroiton can work as well.  Next visit, come fasting.  IF you received an x-ray today, you will receive an invoice from [facility]. Please contact [facility] at [phone] with questions or concerns regarding your invoice.   IF you received labwork today, you will receive an invoice from [employer]. Please contact [facility] at [phone] with questions or concerns regarding your invoice.   Our billing staff will not be able to assist you with questions regarding bills from these companies.  You will be contacted with the lab results as soon as they are available. The fastest way to get your results is to activate your My Chart account. Instructions are located on the last page of this paperwork. If you have not heard from [place] regarding the results in 2 weeks, please contact this office.     If you notice these symptoms, call and we switch to procardia instead of the amlodipine to see if it helps the cold and discolored fingers in winter. Raynaud Phenomenon Raynaud phenomenon is a condition that affects the blood vessels (arteries) that carry blood to your fingers and toes. The arteries that supply blood to your ears or the tip of your nose might also be affected. Raynaud phenomenon causes the arteries to temporarily narrow. As a result, the flow of blood to the affected areas is temporarily decreased. This usually occurs in response to cold temperatures or stress. During an attack, the skin in the affected areas  turns white. You may also feel tingling or numbness in those areas. Attacks usually last for only a brief period, and then the blood flow to the area returns to normal. In most cases, Raynaud phenomenon does not cause serious health problems. CAUSES  For many people with this condition, the cause is not known. Raynaud phenomenon is sometimes associated with other diseases, such as scleroderma or lupus. RISK FACTORS Raynaud phenomenon can affect anyone, but it develops most often in people who are 55-55 years old. It affects more females than males. SIGNS AND SYMPTOMS Symptoms of Raynaud phenomenon may occur when you are exposed to cold temperatures or when you have emotional stress. The symptoms may last for a few minutes or up to several hours. They usually affect your fingers but may also affect your toes, ears, or the tip of your nose. Symptoms may include:  Changes in skin color. The skin in the affected areas will turn pale or white. The skin may then change from white to bluish to red as normal blood flow returns to the area.  Numbness, tingling, or pain in the affected areas. In severe cases, sores may develop in the affected areas.  DIAGNOSIS  Your health care provider will do a physical exam and take your medical history. You may be asked to put your hands in cold water to check for a reaction to cold temperature. Blood tests may be done to check for other diseases or conditions. Your health care provider may also order a test to check the movement of blood through your arteries and veins (vascular ultrasound). TREATMENT  Treatment often involves  making lifestyle changes and taking steps to control your exposure to cold temperatures. For more severe cases, medicine (calcium channel blockers) may be used to improve blood flow. Surgery is sometimes done to block the nerves that control the affected arteries, but this is rare. HOME CARE INSTRUCTIONS   Avoid exposure to cold by taking these  steps:  If possible, stay indoors during cold weather.  When you go outside during cold weather, dress in layers and wear mittens, a hat, a scarf, and warm footwear.  Wear mittens or gloves when handling ice or frozen food.  Use holders for glasses or cans containing cold drinks.  Let warm water run for a while before taking a shower or bath.  Warm up the car before driving in cold weather.  If possible, avoid stressful and emotional situations. Exercise, meditation, and yoga may help you cope with stress. Biofeedback may be useful.  Do not use any tobacco products, including cigarettes, chewing tobacco, or electronic cigarettes. If you need help quitting, ask your health care provider.  Avoid secondhand smoke.  Limit your use of caffeine. Switch to decaffeinated coffee, tea, and soda. Avoid chocolate.  Wear loose fitting socks and comfortable, roomy shoes.  Avoid vibrating tools and machinery.  Take medicines only as directed by your health care provider. SEEK MEDICAL CARE IF:   Your discomfort becomes worse despite lifestyle changes.  You develop sores on your fingers or toes that do not heal.  Your fingers or toes turn black.  You have breaks in the skin on your fingers or toes.  You have a fever.  You have pain or swelling in your joints.  You have a rash.  Your symptoms occur on only one side of your body.   This information is not intended to replace advice given to you by your health care provider. Make sure you discuss any questions you have with your health care provider.   Document Released: 07/08/2000 Document Revised: 08/01/2014 Document Reviewed: 02/13/2014 Elsevier Interactive Patient Education [employer].

## 2016-04-29 ENCOUNTER — Encounter: Payer: Self-pay | Admitting: Family Medicine

## 2016-04-29 LAB — COMPREHENSIVE METABOLIC PANEL
ALT: 22 U/L (ref 6–29)
AST: 22 U/L (ref 10–35)
Albumin: 4.3 g/dL (ref 3.6–5.1)
Alkaline Phosphatase: 61 U/L (ref 33–130)
BUN: 20 mg/dL (ref 7–25)
CO2: 28 mmol/L (ref 20–31)
Calcium: 9.7 mg/dL (ref 8.6–10.4)
Chloride: 99 mmol/L (ref 98–110)
Creat: 0.87 mg/dL (ref 0.50–1.05)
Glucose, Bld: 90 mg/dL (ref 65–99)
Potassium: 3.7 mmol/L (ref 3.5–5.3)
Sodium: 139 mmol/L (ref 135–146)
Total Bilirubin: 0.4 mg/dL (ref 0.2–1.2)
Total Protein: 6.9 g/dL (ref 6.1–8.1)

## 2016-05-25 ENCOUNTER — Encounter: Payer: Self-pay | Admitting: Family Medicine

## 2016-07-11 DIAGNOSIS — M1990 Unspecified osteoarthritis, unspecified site: Secondary | ICD-10-CM | POA: Diagnosis not present

## 2016-07-11 DIAGNOSIS — I7300 Raynaud's syndrome without gangrene: Secondary | ICD-10-CM | POA: Diagnosis not present

## 2016-07-11 DIAGNOSIS — R768 Other specified abnormal immunological findings in serum: Secondary | ICD-10-CM | POA: Diagnosis not present

## 2016-10-29 DIAGNOSIS — H5213 Myopia, bilateral: Secondary | ICD-10-CM | POA: Diagnosis not present

## 2016-11-09 DIAGNOSIS — I7300 Raynaud's syndrome without gangrene: Secondary | ICD-10-CM | POA: Diagnosis not present

## 2016-11-09 DIAGNOSIS — M1990 Unspecified osteoarthritis, unspecified site: Secondary | ICD-10-CM | POA: Diagnosis not present

## 2016-11-09 DIAGNOSIS — Z720 Tobacco use: Secondary | ICD-10-CM | POA: Diagnosis not present

## 2016-11-09 DIAGNOSIS — R768 Other specified abnormal immunological findings in serum: Secondary | ICD-10-CM | POA: Diagnosis not present

## 2016-11-24 ENCOUNTER — Encounter: Payer: Self-pay | Admitting: Physician Assistant

## 2016-11-24 ENCOUNTER — Ambulatory Visit (INDEPENDENT_AMBULATORY_CARE_PROVIDER_SITE_OTHER): Payer: BLUE CROSS/BLUE SHIELD | Admitting: Physician Assistant

## 2016-11-24 VITALS — BP 170/90 | HR 94 | Temp 98.3°F | Resp 18 | Ht 61.81 in | Wt 114.6 lb

## 2016-11-24 DIAGNOSIS — R030 Elevated blood-pressure reading, without diagnosis of hypertension: Secondary | ICD-10-CM | POA: Diagnosis not present

## 2016-11-24 DIAGNOSIS — L299 Pruritus, unspecified: Secondary | ICD-10-CM | POA: Diagnosis not present

## 2016-11-24 DIAGNOSIS — I10 Essential (primary) hypertension: Secondary | ICD-10-CM

## 2016-11-24 DIAGNOSIS — Z131 Encounter for screening for diabetes mellitus: Secondary | ICD-10-CM

## 2016-11-24 LAB — POCT URINALYSIS DIP (MANUAL ENTRY)
Bilirubin, UA: NEGATIVE
Glucose, UA: NEGATIVE mg/dL
Ketones, POC UA: NEGATIVE mg/dL
Leukocytes, UA: NEGATIVE
Nitrite, UA: NEGATIVE
Protein Ur, POC: NEGATIVE mg/dL
Spec Grav, UA: >=1.03 — AB (ref 1.010–1.025)
Urobilinogen, UA: 0.2 E.U./dL
pH, UA: 5 (ref 5.0–8.0)

## 2016-11-24 LAB — POCT GLYCOSYLATED HEMOGLOBIN (HGB A1C): Hemoglobin A1C: 5.2

## 2016-11-24 MED ORDER — AMLODIPINE BESYLATE 5 MG PO TABS: 10.0000 mg | ORAL_TABLET | Freq: Every day | ORAL | 3 refills | Status: DC

## 2016-11-24 MED ORDER — HYDROXYZINE HCL 10 MG PO TABS: 10.0000 mg | ORAL_TABLET | Freq: Three times a day (TID) | ORAL | 0 refills | Status: DC | PRN

## 2016-11-24 MED ORDER — PREDNISONE 20 MG PO TABS: ORAL_TABLET | ORAL | 0 refills | Status: AC

## 2016-11-24 NOTE — Patient Instructions (Addendum)
Take 10 mg of Norvasc daily along with all of your other medications.   I will see you back in about 10 days for follow up.  Please keep a BP diary.    We recommend that you schedule a mammogram for breast cancer screening. Typically, you do not need a referral to do this. Please contact a local imaging center to schedule your mammogram.  [facility] - [phone]  *ask for the Radiology Department [facility] ([place]) - [phone] or [phone]  [facility] - [phone] [place] [phone] [facility] - [phone]  *ask for the [facility] - [phone]  *ask for the Radiology Department [facility] - [phone]  *ask for the [facility] - [phone]    IF you received an x-ray today, you will receive an invoice from [facility]. Please contact [facility] at [phone] with questions or concerns regarding your invoice.   IF you received labwork today, you will receive an invoice from [facility]. Please contact [facility] at [phone] with questions or concerns regarding your invoice.   Our billing staff will not be able to assist you with questions regarding bills from these companies.  You will be contacted with the lab results as soon as they are available. The fastest way to get your results is to activate your My Chart account. Instructions are located on the last page of this paperwork. If you have not heard from [place] regarding the results in 2 weeks, please contact this office.

## 2016-11-24 NOTE — Progress Notes (Signed)
11/24/2016 5:21 PM   DOB: 1960-08-01 / MRN: [id number]  SUBJECTIVE:  Alyssa Rhodes is a 55 y.o. female presenting for itching about her right arm that started today. Has tried bendy and this did not help and this was two hours ago.  She denies any pain today and denies rash. She has had this in the past, roughly 6 months ago, and the problem eventually remised withh benadryl.   She has a history of positive ANA with a very high titer and is taking Hydroxychlorquine daily without missing doses.  She is managed by rheumatology.   She is allergic to codeine.   She  has a past medical history of Hypertension and Thyroid nodule (03/2010).    She  reports that she has been smoking Cigarettes.  She has been smoking about 1.00 pack per day. She has never used smokeless tobacco. She reports that she drinks alcohol. She reports that she does not use drugs. She  has no sexual activity history on file. The patient  has a past surgical history that includes Cesarean section; Abdominal hysterectomy; and Shoulder surgery (2011).  Her family history includes Asthma in her mother; Diabetes in her mother; Heart disease in her father; Hypertension in her father.  Review of Systems  Constitutional: Negative for fever.  Skin: Positive for itching. Negative for rash.    The problem list and medications were reviewed and updated by myself where necessary and exist elsewhere in the encounter.   OBJECTIVE:  BP (!) 170/90   Pulse 94   Temp 98.3 F (36.8 C) (Oral)   Resp 18   Ht 5' 1.81" (1.57 m)   Wt 114 lb 9.6 oz (52 kg)   SpO2 97%   BMI 21.09 kg/m   BP Readings from Last 3 Encounters:  11/24/16 (!) 170/90  04/28/16 124/66  11/17/15 (!) 147/76     Physical Exam  Constitutional: She is active.  Non-toxic appearance.  HENT:  Right Ear: Hearing, tympanic membrane, external ear and ear canal normal.  Left Ear: Hearing, tympanic membrane, external ear and ear canal normal.  Nose: Nose normal. Right sinus  exhibits no maxillary sinus tenderness and no frontal sinus tenderness. Left sinus exhibits no maxillary sinus tenderness and no frontal sinus tenderness.  Mouth/Throat: Uvula is midline, oropharynx is clear and moist and mucous membranes are normal. Mucous membranes are not dry. No oropharyngeal exudate, posterior oropharyngeal edema or tonsillar abscesses.    Cardiovascular: Normal rate, regular rhythm, S1 normal, S2 normal, normal heart sounds and intact distal pulses.  Exam reveals no gallop, no friction rub and no decreased pulses.   No murmur heard. Pulmonary/Chest: Effort normal. No stridor. No tachypnea. No respiratory distress. She has no wheezes. She has no rales.  Abdominal: She exhibits no distension.  Musculoskeletal: She exhibits no edema.  Lymphadenopathy:       Head (right side): No submandibular and no tonsillar adenopathy present.       Head (left side): No submandibular and no tonsillar adenopathy present.    She has no cervical adenopathy.  Neurological: She is alert.  Skin: Skin is warm and dry. She is not diaphoretic. No pallor.    Results for orders placed or performed in visit on 11/24/16 (from the past 72 hour(s))  POCT urinalysis dipstick     Status: Abnormal   Collection Time: 11/24/16  4:37 PM  Result Value Ref Range   Color, UA yellow yellow   Clarity, UA clear clear   Glucose, UA  negative negative mg/dL   Bilirubin, UA negative negative   Ketones, POC UA negative negative mg/dL   Spec Grav, UA >=1.030 (A) 1.010 - 1.025   Blood, UA trace-intact (A) negative   pH, UA 5.0 5.0 - 8.0   Protein Ur, POC negative negative mg/dL   Urobilinogen, UA 0.2 0.2 or 1.0 E.U./dL   Nitrite, UA Negative Negative   Leukocytes, UA Negative Negative  POCT glycosylated hemoglobin (Hb A1C)     Status: None   Collection Time: 11/24/16  5:15 PM  Result Value Ref Range   Hemoglobin A1C 5.2    Lab Results  Component Value Date   HGBA1C 5.2 11/24/2016    ASSESSMENT AND  PLAN:  Kiesha was seen today for itching.  Diagnoses and all orders for this visit:  Elevated blood pressure reading: BP did not improve on recheck.  Please see the final problem.  -     Recheck vitals -     POCT urinalysis dipstick  Screening for diabetes mellitus -     POCT glycosylated hemoglobin (Hb A1C)  Itching: there is no rash.  She has a history of very high ANA titer and is taking hydroxychloroquine. Her last rheum appointment was one month ago. Pred will        -     CMP14+EGFR -     TSH -     CBC  Essential hypertension: Poorly controlled and it looks like she has had problems with control in the past.  Increasing her CCB to 10 mg daily. She will come back in about 10 days.   -     amLODipine (NORVASC) 5 MG tablet; Take 2 tablets (10 mg total) by mouth daily.    The patient is advised to call or return to clinic if she does not see an improvement in symptoms, or to seek the care of the closest emergency department if she worsens with the above plan.   Philis Fendt, MHS, PA-C [facility] 11/24/2016 5:21 PM

## 2016-11-25 LAB — CMP14+EGFR
ALT: 20 IU/L (ref 0–32)
AST: 27 IU/L (ref 0–40)
Albumin/Globulin Ratio: 1.8 (ref 1.2–2.2)
Albumin: 4.6 g/dL (ref 3.5–5.5)
Alkaline Phosphatase: 75 IU/L (ref 39–117)
BUN/Creatinine Ratio: 33 — ABNORMAL HIGH (ref 9–23)
BUN: 23 mg/dL (ref 6–24)
Bilirubin Total: 0.3 mg/dL (ref 0.0–1.2)
CO2: 26 mmol/L (ref 18–29)
Calcium: 10.1 mg/dL (ref 8.7–10.2)
Chloride: 94 mmol/L — ABNORMAL LOW (ref 96–106)
Creatinine, Ser: 0.69 mg/dL (ref 0.57–1.00)
GFR calc Af Amer: 113 mL/min/{1.73_m2} (ref >59)
GFR calc non Af Amer: 98 mL/min/{1.73_m2} (ref >59)
Globulin, Total: 2.6 g/dL (ref 1.5–4.5)
Glucose: 97 mg/dL (ref 65–99)
Potassium: 3.9 mmol/L (ref 3.5–5.2)
Sodium: 139 mmol/L (ref 134–144)
Total Protein: 7.2 g/dL (ref 6.0–8.5)

## 2016-11-25 LAB — CBC
Hematocrit: 39.9 % (ref 34.0–46.6)
Hemoglobin: 13.9 g/dL (ref 11.1–15.9)
MCH: 34.2 pg — ABNORMAL HIGH (ref 26.6–33.0)
MCHC: 34.8 g/dL (ref 31.5–35.7)
MCV: 98 fL — ABNORMAL HIGH (ref 79–97)
Platelets: 391 10*3/uL — ABNORMAL HIGH (ref 150–379)
RBC: 4.07 x10E6/uL (ref 3.77–5.28)
RDW: 14.1 % (ref 12.3–15.4)
WBC: 5.8 10*3/uL (ref 3.4–10.8)

## 2016-11-25 LAB — TSH: TSH: 0.718 u[IU]/mL (ref 0.450–4.500)

## 2016-12-02 ENCOUNTER — Ambulatory Visit (INDEPENDENT_AMBULATORY_CARE_PROVIDER_SITE_OTHER): Payer: BLUE CROSS/BLUE SHIELD | Admitting: Physician Assistant

## 2016-12-02 ENCOUNTER — Encounter: Payer: Self-pay | Admitting: Physician Assistant

## 2016-12-02 VITALS — BP 128/74 | HR 90 | Temp 98.4°F | Resp 18 | Ht 61.81 in | Wt 116.2 lb

## 2016-12-02 DIAGNOSIS — I10 Essential (primary) hypertension: Secondary | ICD-10-CM | POA: Diagnosis not present

## 2016-12-02 DIAGNOSIS — Z720 Tobacco use: Secondary | ICD-10-CM

## 2016-12-02 DIAGNOSIS — L299 Pruritus, unspecified: Secondary | ICD-10-CM | POA: Diagnosis not present

## 2016-12-02 MED ORDER — AMLODIPINE BESYLATE 10 MG PO TABS: 10.0000 mg | ORAL_TABLET | Freq: Every day | ORAL | 3 refills | Status: DC

## 2016-12-02 NOTE — Patient Instructions (Signed)
     IF you received an x-ray today, you will receive an invoice from [facility]. Please contact [facility] at [phone] with questions or concerns regarding your invoice.   IF you received labwork today, you will receive an invoice from [facility]. Please contact [facility] at [phone] with questions or concerns regarding your invoice.   Our billing staff will not be able to assist you with questions regarding bills from these companies.  You will be contacted with the lab results as soon as they are available. The fastest way to get your results is to activate your My Chart account. Instructions are located on the last page of this paperwork. If you have not heard from us regarding the results in 2 weeks, please contact this office.     

## 2016-12-02 NOTE — Progress Notes (Signed)
12/02/2016 8:25 AM   DOB: 1961/01/25 / MRN: [id number]  SUBJECTIVE:  Alyssa Rhodes is a 56 y.o. female presenting for itching.  Tells me that this resolved within three days.    She continues to have elevated BP and denies chest, leg swelling, sudden vision change, HA, new shortness of breath.   She is allergic to codeine.   She  has a past medical history of Hypertension and Thyroid nodule (03/2010).    She  reports that she has been smoking Cigarettes.  She has been smoking about 1.00 pack per day. She has never used smokeless tobacco. She reports that she drinks alcohol. She reports that she does not use drugs. She  has no sexual activity history on file. The patient  has a past surgical history that includes Cesarean section; Abdominal hysterectomy; and Shoulder surgery (2011).  Her family history includes Asthma in her mother; Diabetes in her mother; Heart disease in her father; Hypertension in her father.  Review of Systems  Constitutional: Negative for chills, diaphoresis and fever.  Eyes: Negative.   Respiratory: Negative for cough, hemoptysis, sputum production, shortness of breath and wheezing.   Cardiovascular: Negative for chest pain, orthopnea and leg swelling.  Gastrointestinal: Negative for nausea.  Skin: Negative for rash.  Neurological: Negative for dizziness, sensory change, speech change, focal weakness and headaches.    The problem list and medications were reviewed and updated by myself where necessary and exist elsewhere in the encounter.   OBJECTIVE:  BP 128/74   Pulse 90   Temp 98.4 F (36.9 C) (Oral)   Resp 18   Ht 5' 1.81" (1.57 m)   Wt 116 lb 3.2 oz (52.7 kg)   SpO2 99%   BMI 21.38 kg/m   BP Readings from Last 3 Encounters:  12/02/16 128/74  11/24/16 (!) 170/90  04/28/16 124/66     Physical Exam  Constitutional: She is active.  Non-toxic appearance.  Cardiovascular: Normal rate, regular rhythm, S1 normal, S2 normal, normal heart sounds and  intact distal pulses.  Exam reveals no gallop, no friction rub and no decreased pulses.   No murmur heard. Pulmonary/Chest: Effort normal. No stridor. No tachypnea. No respiratory distress. She has no wheezes. She has no rales.  Abdominal: She exhibits no distension.  Musculoskeletal: She exhibits no edema.  Neurological: She is alert.  Skin: Skin is warm and dry. She is not diaphoretic. No pallor.    Lab Results  Component Value Date   WBC 5.8 11/24/2016   HGB 15.3 (H) 08/11/2015   HCT 39.9 11/24/2016   MCV 98 (H) 11/24/2016   PLT 391 (H) 11/24/2016    Lab Results  Component Value Date   NA 139 11/24/2016   K 3.9 11/24/2016   CL 94 (L) 11/24/2016   CO2 26 11/24/2016    Lab Results  Component Value Date   CREATININE 0.69 11/24/2016    Lab Results  Component Value Date   ALT 20 11/24/2016   AST 27 11/24/2016   ALKPHOS 75 11/24/2016   BILITOT 0.3 11/24/2016    Lab Results  Component Value Date   TSH 0.718 11/24/2016    Lab Results  Component Value Date   HGBA1C 5.2 11/24/2016      No results found for this or any previous visit (from the past 72 hour(s)).  No results found. Tells  ASSESSMENT AND PLAN:  Alyssa Rhodes was seen today for follow-up.  Diagnoses and all orders for this visit:  Essential hypertension Comments:  Controlled on Norvasc 10 mg.  Recheck in 6 months.   Tobacco user Comments: She is not ready to quit today. Will revisit in the future.   Itching Comments: resolved.   Other orders -     amLODipine (NORVASC) 10 MG tablet; Take 1 tablet (10 mg total) by mouth daily.    The patient is advised to call or return to clinic if she does not see an improvement in symptoms, or to seek the care of the closest emergency department if she worsens with the above plan.   Philis Fendt, MHS, PA-C [facility] 12/02/2016 8:25 AM

## 2017-03-21 DIAGNOSIS — I7300 Raynaud's syndrome without gangrene: Secondary | ICD-10-CM | POA: Diagnosis not present

## 2017-03-21 DIAGNOSIS — R768 Other specified abnormal immunological findings in serum: Secondary | ICD-10-CM | POA: Diagnosis not present

## 2017-03-21 DIAGNOSIS — M1990 Unspecified osteoarthritis, unspecified site: Secondary | ICD-10-CM | POA: Diagnosis not present

## 2017-06-01 ENCOUNTER — Other Ambulatory Visit: Payer: Self-pay | Admitting: Family Medicine

## 2017-06-01 DIAGNOSIS — I10 Essential (primary) hypertension: Secondary | ICD-10-CM

## 2018-02-09 ENCOUNTER — Ambulatory Visit: Payer: BLUE CROSS/BLUE SHIELD | Admitting: Physician Assistant

## 2018-02-09 ENCOUNTER — Encounter: Payer: Self-pay | Admitting: Physician Assistant

## 2018-02-09 ENCOUNTER — Other Ambulatory Visit: Payer: Self-pay

## 2018-02-09 VITALS — BP 164/86 | HR 91 | Temp 99.1°F | Resp 16 | Ht 62.0 in | Wt 117.6 lb

## 2018-02-09 DIAGNOSIS — F17210 Nicotine dependence, cigarettes, uncomplicated: Secondary | ICD-10-CM | POA: Diagnosis not present

## 2018-02-09 DIAGNOSIS — Z720 Tobacco use: Secondary | ICD-10-CM

## 2018-02-09 DIAGNOSIS — I10 Essential (primary) hypertension: Secondary | ICD-10-CM

## 2018-02-09 DIAGNOSIS — Z9189 Other specified personal risk factors, not elsewhere classified: Secondary | ICD-10-CM | POA: Diagnosis not present

## 2018-02-09 MED ORDER — LOSARTAN POTASSIUM-HCTZ 50-12.5 MG PO TABS: 1.0000 | ORAL_TABLET | Freq: Every day | ORAL | 1 refills | Status: DC

## 2018-02-09 MED ORDER — AMLODIPINE BESYLATE 5 MG PO TABS: 5.0000 mg | ORAL_TABLET | Freq: Every day | ORAL | 1 refills | Status: DC

## 2018-02-09 MED ORDER — ATORVASTATIN CALCIUM 40 MG PO TABS: 40.0000 mg | ORAL_TABLET | Freq: Every day | ORAL | 3 refills | Status: DC

## 2018-02-09 NOTE — Progress Notes (Signed)
02/09/2018 10:43 AM   DOB: 1961/02/13 / MRN: [id number]  SUBJECTIVE:  Alyssa Rhodes is a 57 y.o. female presenting for HTN. Out of meds for about 1/2 year. Feels well and denies CP, SOB, DOE, orthopnea, leg swelling. ASCVD risk elevated.  Taking ASA. No diabetes. Okay with starting a statin today.  40 pack year history of smoking.  No interest in quitting today. Does not want lung cancer screen at this time.   She is allergic to codeine.   She  has a past medical history of Hypertension and Thyroid nodule (03/2010).    She  reports that she has been smoking cigarettes.  She has been smoking about 1.00 pack per day. She has never used smokeless tobacco. She reports that she drinks about 1.8 oz of alcohol per week. She reports that she does not use drugs. She  has no sexual activity history on file. The patient  has a past surgical history that includes Cesarean section; Abdominal hysterectomy; and Shoulder surgery (2011).  Her family history includes Asthma in her mother; Diabetes in her mother; Heart disease in her father; Hypertension in her father.  Review of Systems  Constitutional: Negative for chills, diaphoresis and fever.  Eyes: Negative.   Respiratory: Negative for cough, hemoptysis, sputum production, shortness of breath and wheezing.   Cardiovascular: Negative for chest pain, orthopnea and leg swelling.  Gastrointestinal: Negative for nausea.  Skin: Negative for rash.  Neurological: Negative for dizziness, sensory change, speech change, focal weakness and headaches.    The problem list and medications were reviewed and updated by myself where necessary and exist elsewhere in the encounter.   OBJECTIVE:  BP (!) 164/86 (BP Location: Left Arm, Patient Position: Sitting, Cuff Size: Normal)   Pulse 91   Temp 99.1 F (37.3 C) (Oral)   Resp 16   Ht 5\' 2"  (1.575 m)   Wt 117 lb 9.6 oz (53.3 kg)   SpO2 95%   BMI 21.51 kg/m   Wt Readings from Last 3 Encounters:  02/09/18 117  lb 9.6 oz (53.3 kg)  12/02/16 116 lb 3.2 oz (52.7 kg)  11/24/16 114 lb 9.6 oz (52 kg)   Temp Readings from Last 3 Encounters:  02/09/18 99.1 F (37.3 C) (Oral)  12/02/16 98.4 F (36.9 C) (Oral)  11/24/16 98.3 F (36.8 C) (Oral)   BP Readings from Last 3 Encounters:  02/09/18 (!) 164/86  12/02/16 128/74  11/24/16 (!) 170/90   Pulse Readings from Last 3 Encounters:  02/09/18 91  12/02/16 90  11/24/16 94    Physical Exam  Constitutional: She is oriented to person, place, and time. She appears well-nourished. No distress.  Eyes: Pupils are equal, round, and reactive to light. EOM are normal.  Cardiovascular: Normal rate, regular rhythm, S1 normal, S2 normal, normal heart sounds and intact distal pulses. Exam reveals no gallop, no friction rub and no decreased pulses.  No murmur heard. Pulmonary/Chest: Effort normal. No stridor. No respiratory distress. She has no wheezes. She has no rales.  Abdominal: She exhibits no distension.  Musculoskeletal: She exhibits no edema.  Neurological: She is alert and oriented to person, place, and time. No cranial nerve deficit. Gait normal.  Skin: Skin is dry. She is not diaphoretic.  Psychiatric: She has a normal mood and affect.  Vitals reviewed.   Lab Results  Component Value Date   HGBA1C 5.2 11/24/2016    Lab Results  Component Value Date   WBC 5.8 11/24/2016  HGB 13.9 11/24/2016   HCT 39.9 11/24/2016   MCV 98 (H) 11/24/2016   PLT 391 (H) 11/24/2016    Lab Results  Component Value Date   CREATININE 0.69 11/24/2016   BUN 23 11/24/2016   NA 139 11/24/2016   K 3.9 11/24/2016   CL 94 (L) 11/24/2016   CO2 26 11/24/2016    Lab Results  Component Value Date   ALT 20 11/24/2016   AST 27 11/24/2016   ALKPHOS 75 11/24/2016   BILITOT 0.3 11/24/2016    Lab Results  Component Value Date   TSH 0.718 11/24/2016    Lab Results  Component Value Date   CHOL 185 08/11/2015   HDL 39 (L) 08/11/2015   LDLCALC 74 08/11/2015     TRIG 361 (H) 08/11/2015   CHOLHDL 4.7 08/11/2015     ASSESSMENT AND PLAN:  Kalandra was seen today for hypertension.  Diagnoses and all orders for this visit:  Essential hypertension -     amLODipine (NORVASC) 5 MG tablet; Take 1 tablet (5 mg total) by mouth daily. -     losartan-hydrochlorothiazide (HYZAAR) 50-12.5 MG tablet; Take 1 tablet by mouth daily.  Tobacco user Comments: No interest in quitting. Refuses CT chest.   High risk of cardiac event Comments: Starting statin.  She is taking ASA.  Orders: -     atorvastatin (LIPITOR) 40 MG tablet; Take 1 tablet (40 mg total) by mouth daily.    The patient is advised to call or return to clinic if she does not see an improvement in symptoms, or to seek the care of the closest emergency department if she worsens with the above plan.   Philis Fendt, MHS, PA-C [facility] 02/09/2018 10:43 AM

## 2018-02-09 NOTE — Patient Instructions (Signed)
     IF you received an x-ray today, you will receive an invoice from [facility]. Please contact [facility] at [phone] with questions or concerns regarding your invoice.   IF you received labwork today, you will receive an invoice from [facility]. Please contact [facility] at [phone] with questions or concerns regarding your invoice.   Our billing staff will not be able to assist you with questions regarding bills from these companies.  You will be contacted with the lab results as soon as they are available. The fastest way to get your results is to activate your My Chart account. Instructions are located on the last page of this paperwork. If you have not heard from us regarding the results in 2 weeks, please contact this office.     

## 2018-02-10 LAB — RENAL FUNCTION PANEL
Albumin: 4.5 g/dL (ref 3.5–5.5)
BUN/Creatinine Ratio: 20 (ref 9–23)
BUN: 10 mg/dL (ref 6–24)
CO2: 22 mmol/L (ref 20–29)
Calcium: 10.7 mg/dL — ABNORMAL HIGH (ref 8.7–10.2)
Chloride: 100 mmol/L (ref 96–106)
Creatinine, Ser: 0.49 mg/dL — ABNORMAL LOW (ref 0.57–1.00)
GFR calc Af Amer: 125 mL/min/{1.73_m2} (ref >59)
GFR calc non Af Amer: 109 mL/min/{1.73_m2} (ref >59)
Glucose: 114 mg/dL — ABNORMAL HIGH (ref 65–99)
Phosphorus: 3.8 mg/dL (ref 2.5–4.5)
Potassium: 3.4 mmol/L — ABNORMAL LOW (ref 3.5–5.2)
Sodium: 144 mmol/L (ref 134–144)

## 2018-02-10 LAB — LIPID PANEL
Chol/HDL Ratio: 4 ratio (ref 0.0–4.4)
Cholesterol, Total: 166 mg/dL (ref 100–199)
HDL: 42 mg/dL (ref >39)
LDL Calculated: 85 mg/dL (ref 0–99)
Triglycerides: 195 mg/dL — ABNORMAL HIGH (ref 0–149)
VLDL Cholesterol Cal: 39 mg/dL (ref 5–40)

## 2018-02-12 ENCOUNTER — Telehealth: Payer: Self-pay | Admitting: Physician Assistant

## 2018-02-12 NOTE — Telephone Encounter (Signed)
Copied from [place] [id number]. Topic: Quick Communication - See Telephone Encounter >> Feb 12, 2018 11:38 AM Hewitt Shorts wrote: Pt is calling because the pharmacy [facility] [address] is telling the pt that the losartan is on back order and not sure when it will be coming and the pt would like a  Substitute called in    Best number for pt is [phone]

## 2018-02-13 NOTE — Telephone Encounter (Signed)
LOV  02/09/18 Philis Fendt

## 2018-02-19 NOTE — Telephone Encounter (Signed)
She can take 2 norvasc (10 mg total) until her pills come in.  On the day that she receives her pills please make sure that she knows to go back to just one tab norvasc plus the losartan-HCTZ, not 2 norvasc and the combo pill.

## 2018-02-20 NOTE — Telephone Encounter (Signed)
Spoke to patient with directions on how to take medication until back order of Norvasc comes in. Patient verbalized understood.

## 2018-04-04 ENCOUNTER — Telehealth: Payer: Self-pay | Admitting: Physician Assistant

## 2018-04-04 NOTE — Telephone Encounter (Signed)
Copied from [place] [phone]. Topic: Quick Communication - Rx Refill/Question >> Apr 04, 2018  1:00 PM Bea Graff, NT wrote: Medication: amLODipine (NORVASC) 5 MG tablet     Patient states pharmacy unaware she is to take 2 tablets instead of 1 due to pharmacy being out of potassium.  Has the patient contacted their pharmacy? Yes.   (Agent: If no, request that the patient contact the pharmacy for the refill.) (Agent: If yes, when and what did the pharmacy advise?)  Preferred Pharmacy (with phone number or street name): [facility], [place] [phone] (Phone) [phone] (Fax)    Agent: Please be advised that RX refills may take up to 3 business days. We ask that you follow-up with your pharmacy.

## 2018-04-04 NOTE — Telephone Encounter (Signed)
Amlodipine 5 mg refill Last Refill:02/09/18 # 90 with 1 refill Last OV: 02/09/18 PCP: Philis Fendt Pharmacy:[facility] [phone]  Returned because former pt of Philis Fendt.   Do not see follow up with new provider.  This refill should be good until 12/19.   The dose was reduced from 10 mg to 5 mg during the 02/09/18 OV.  Not sure what the pharmacy being out of potassium is about??  Request came in this way.  Pt states pharmacy unaware she is to take 2 tablets instead of 1.  This must be related to the dose reduction referenced above.  Thanks.

## 2018-04-05 ENCOUNTER — Telehealth: Payer: Self-pay

## 2018-04-05 DIAGNOSIS — I10 Essential (primary) hypertension: Secondary | ICD-10-CM

## 2018-04-05 MED ORDER — AMLODIPINE BESYLATE 10 MG PO TABS: 10.0000 mg | ORAL_TABLET | Freq: Every day | ORAL | 1 refills | Status: DC

## 2018-04-05 NOTE — Telephone Encounter (Signed)
Pt is now taking 10mg  of Norvasc. Needs new rs sent to pharmacy. The increase was issued 12/02/16. The encounter from 02/09/18 shows that 5mg  was given. Pt states that she is taking 10mg 

## 2018-04-05 NOTE — Telephone Encounter (Signed)
    Pt is now taking 10mg  of Norvasc. Needs new rs sent to pharmacy. The increase was issued 12/02/16. The encounter from 02/09/18 shows that 5mg  was given. Pt states that she is taking 10mg

## 2018-04-05 NOTE — Telephone Encounter (Signed)
done

## 2018-10-15 ENCOUNTER — Other Ambulatory Visit: Payer: Self-pay | Admitting: Family Medicine

## 2018-10-15 DIAGNOSIS — I10 Essential (primary) hypertension: Secondary | ICD-10-CM

## 2018-11-14 ENCOUNTER — Other Ambulatory Visit: Payer: Self-pay | Admitting: Family Medicine

## 2018-11-14 DIAGNOSIS — I10 Essential (primary) hypertension: Secondary | ICD-10-CM

## 2018-11-14 NOTE — Telephone Encounter (Signed)
Requested Prescriptions  Pending Prescriptions Disp Refills  . amLODipine (NORVASC) 10 MG tablet [Pharmacy Med Name: amLODIPine Besylate 10 MG Oral Tablet] 7 tablet 0    Sig: TAKE 1 TABLET BY MOUTH ONCE DAILY **DUE  FOR  FOLLOW  UP  APPOINTMENT     Cardiovascular:  Calcium Channel Blockers Failed - 11/14/2018  7:48 AM      Failed - Last BP in normal range    BP Readings from Last 1 Encounters:  02/09/18 (!) 164/86         Failed - Valid encounter within last 6 months    Recent Outpatient Visits          9 months ago Essential hypertension   [facility], PA-C   1 year ago Essential hypertension   [facility], PA-C   1 year ago Elevated blood pressure reading   [facility], PA-C   2 years ago Essential hypertension   [facility], Laurey Arrow, MD   2 years ago Essential hypertension   [facility], [place], [place]           courtesy refill given for 7 days; appt. scheduled 11/19/18

## 2018-11-19 ENCOUNTER — Encounter: Payer: Self-pay | Admitting: Registered Nurse

## 2018-11-19 ENCOUNTER — Other Ambulatory Visit: Payer: Self-pay

## 2018-11-19 ENCOUNTER — Telehealth (INDEPENDENT_AMBULATORY_CARE_PROVIDER_SITE_OTHER): Payer: Self-pay | Admitting: Registered Nurse

## 2018-11-19 DIAGNOSIS — I10 Essential (primary) hypertension: Secondary | ICD-10-CM

## 2018-11-19 DIAGNOSIS — Z7689 Persons encountering health services in other specified circumstances: Secondary | ICD-10-CM

## 2018-11-19 DIAGNOSIS — E785 Hyperlipidemia, unspecified: Secondary | ICD-10-CM

## 2018-11-19 MED ORDER — AMLODIPINE BESYLATE 10 MG PO TABS: 10.0000 mg | ORAL_TABLET | Freq: Every day | ORAL | 0 refills | Status: DC

## 2018-11-19 NOTE — Progress Notes (Signed)
Spoke with pt and she states she needs to establish with a new PCP to mange her HTN medication and chronic conditions. She states she has no other concerns at this time. Need refills and possible labs.

## 2018-11-19 NOTE — Progress Notes (Signed)
Telemedicine Encounter- SOAP NOTE Established Patient  This telephone encounter was conducted with the patient's (or proxy's) verbal consent via audio telecommunications: yes  Patient was instructed to have this encounter in a suitably private space; and to only have persons present to whom they give permission to participate. In addition, patient identity was confirmed by use of name plus two identifiers (DOB and address).  I discussed the limitations, risks, security and privacy concerns of performing an evaluation and management service by telephone and the availability of in person appointments. I also discussed with the patient that there may be a patient responsible charge related to this service. The patient expressed understanding and agreed to proceed.  I spent a total of 18 minutes talking with the patient or their proxy.  CC: Establish care, HTN, HLD  Subjective   Alyssa Rhodes is a 58 y.o. established patient. Telephone visit today for  Pt has hx significant for HTN, elevated lipids, 20 pack year smoking history, and thyroid nodule.  Denies SOB, chest pain, palpitations, leg swelling, N/V, diarrhea, constipation, fever, headache, changes to vision, cough.  Has taken amlodipine 10mg  PO daily with good effect. Will continue on that dose. Will plan to check her BP when she presents to office for labs.  Currently taking atorvastatin 40mg  PO daily for HLD. Will continue to monitor.  Qualifies for chest CT according to USPSTF guidelines for smoking history. Denies interest in quitting, denies screening at this time.       Patient Active Problem List   Diagnosis Date Noted  . HTN (hypertension) 10/18/2011  . Tobacco user 10/18/2011    Past Medical History:  Diagnosis Date  . Hyperlipidemia   . Hypertension   . Lupus ([place])   . Thyroid nodule 03/2010   aspiration    Current Outpatient Medications  Medication Sig Dispense Refill  . amLODipine (NORVASC) 10 MG tablet  TAKE 1 TABLET BY MOUTH ONCE DAILY **DUE  FOR  FOLLOW  UP  APPOINTMENT 7 tablet 0  . aspirin EC 81 MG tablet Take 81 mg by mouth daily.    Marland Kitchen atorvastatin (LIPITOR) 40 MG tablet Take 1 tablet (40 mg total) by mouth daily. 90 tablet 3  . Multiple Vitamin (MULTIVITAMIN) tablet Take 1 tablet by mouth daily.    . Omega-3 Fatty Acids (FISH OIL PO) Take by mouth daily.    Marland Kitchen amLODipine (NORVASC) 10 MG tablet Take 1 tablet (10 mg total) by mouth daily. 120 tablet 0   No current facility-administered medications for this visit.     Allergies  Allergen Reactions  . Codeine Nausea And Vomiting    Social History   Socioeconomic History  . Marital status: Widowed    Spouse name: Not on file  . Number of children: 2  . Years of education: Not on file  . Highest education level: Not on file  Occupational History  . Not on file  Social Needs  . Financial resource strain: Not hard at all  . Food insecurity:    Worry: Never true    Inability: Never true  . Transportation needs:    Medical: No    Non-medical: No  Tobacco Use  . Smoking status: Current Every Day Smoker    Packs/day: 0.50    Years: 41.00    Pack years: 20.50    Types: Cigarettes  . Smokeless tobacco: Never Used  Substance and Sexual Activity  . Alcohol use: Yes    Alcohol/week: 3.0 standard drinks  Types: 3 Standard drinks or equivalent per week    Comment: 3/week  . Drug use: No  . Sexual activity: Not Currently  Lifestyle  . Physical activity:    Days per week: 0 days    Minutes per session: 0 min  . Stress: Only a little  Relationships  . Social connections:    Talks on phone: Never    Gets together: Never    Attends religious service: More than 4 times per year    Active member of club or organization: No    Attends meetings of clubs or organizations: Never    Relationship status: Widowed  . Intimate partner violence:    Fear of current or ex partner: No    Emotionally abused: No    Physically abused: No     Forced sexual activity: No  Other Topics Concern  . Not on file  Social History Narrative  . Not on file    Review of Systems  Constitutional: Negative for fever and malaise/fatigue.  HENT: Negative for congestion.   Eyes: Negative for blurred vision.  Respiratory: Negative for cough and shortness of breath.   Cardiovascular: Negative for chest pain, palpitations and leg swelling.  Gastrointestinal: Negative for constipation, diarrhea, nausea and vomiting.    Objective   Vitals as reported by the patient: There were no vitals filed for this visit.  Diagnoses and all orders for this visit:  Encounter to establish care  Essential hypertension -     amLODipine (NORVASC) 10 MG tablet; Take 1 tablet (10 mg total) by mouth daily. -     Hemoglobin A1c; Future -     CBC with Differential; Future  Hyperlipidemia, unspecified hyperlipidemia type -     Lipid Panel; Future -     Hemoglobin A1c; Future -     CBC with Differential; Future -     Basic Metabolic Panel; Future  PLAN  Pt to report to clinic for lab draw: Lipid Panel, Hgb A1c, CBC, and BMP due to previous abnormal results and routine monitoring.  Refill ordered for amlodipine, patient reports she has a good supply of atorvastatin at this time.   Pt to be seen within 4 months for complete physical exam.  Discussed further preventative care with patient, and while she is not interested today, we will revisit in the future. This included but was not limited to smoking cessation and chest CT.  Encouraged patient to call the clinic with any questions or concerns.   I discussed the assessment and treatment plan with the patient. The patient was provided an opportunity to ask questions and all were answered. The patient agreed with the plan and demonstrated an understanding of the instructions.   The patient was advised to call back or seek an in-person evaluation if the symptoms worsen or if the condition fails to  improve as anticipated.  I provided 25 minutes of non-face-to-face time during this encounter.  Maximiano Coss, NP  [facility] at [facility]

## 2018-11-23 ENCOUNTER — Other Ambulatory Visit: Payer: Self-pay

## 2018-11-23 ENCOUNTER — Ambulatory Visit (INDEPENDENT_AMBULATORY_CARE_PROVIDER_SITE_OTHER): Payer: BLUE CROSS/BLUE SHIELD | Admitting: Family Medicine

## 2018-11-23 DIAGNOSIS — E785 Hyperlipidemia, unspecified: Secondary | ICD-10-CM

## 2018-11-23 DIAGNOSIS — I10 Essential (primary) hypertension: Secondary | ICD-10-CM | POA: Diagnosis not present

## 2018-11-23 LAB — CBC WITH DIFFERENTIAL/PLATELET
Basophils Absolute: 0 10*3/uL (ref 0.0–0.2)
Basos: 1 %
EOS (ABSOLUTE): 0.2 10*3/uL (ref 0.0–0.4)
Eos: 3 %
Hematocrit: 40.8 % (ref 34.0–46.6)
Hemoglobin: 14.1 g/dL (ref 11.1–15.9)
Immature Grans (Abs): 0 10*3/uL (ref 0.0–0.1)
Immature Granulocytes: 0 %
Lymphocytes Absolute: 1.8 10*3/uL (ref 0.7–3.1)
Lymphs: 23 %
MCH: 32 pg (ref 26.6–33.0)
MCHC: 34.6 g/dL (ref 31.5–35.7)
MCV: 93 fL (ref 79–97)
Monocytes Absolute: 0.7 10*3/uL (ref 0.1–0.9)
Monocytes: 9 %
Neutrophils Absolute: 5 10*3/uL (ref 1.4–7.0)
Neutrophils: 64 %
Platelets: 441 10*3/uL (ref 150–450)
RBC: 4.41 x10E6/uL (ref 3.77–5.28)
RDW: 14.6 % (ref 11.7–15.4)
WBC: 7.8 10*3/uL (ref 3.4–10.8)

## 2018-11-23 LAB — HEMOGLOBIN A1C
Est. average glucose Bld gHb Est-mCnc: 128 mg/dL
Hgb A1c MFr Bld: 6.1 % — ABNORMAL HIGH (ref 4.8–5.6)

## 2018-11-23 LAB — BASIC METABOLIC PANEL
BUN/Creatinine Ratio: 25 — ABNORMAL HIGH (ref 9–23)
BUN: 14 mg/dL (ref 6–24)
CO2: 25 mmol/L (ref 20–29)
Calcium: 9.6 mg/dL (ref 8.7–10.2)
Chloride: 98 mmol/L (ref 96–106)
Creatinine, Ser: 0.57 mg/dL (ref 0.57–1.00)
GFR calc Af Amer: 119 mL/min/{1.73_m2} (ref >59)
GFR calc non Af Amer: 103 mL/min/{1.73_m2} (ref >59)
Glucose: 131 mg/dL — ABNORMAL HIGH (ref 65–99)
Potassium: 3.7 mmol/L (ref 3.5–5.2)
Sodium: 137 mmol/L (ref 134–144)

## 2018-11-23 LAB — LIPID PANEL
Chol/HDL Ratio: 2.4 ratio (ref 0.0–4.4)
Cholesterol, Total: 112 mg/dL (ref 100–199)
HDL: 47 mg/dL (ref >39)
LDL Calculated: 44 mg/dL (ref 0–99)
Triglycerides: 106 mg/dL (ref 0–149)
VLDL Cholesterol Cal: 21 mg/dL (ref 5–40)

## 2019-01-11 DIAGNOSIS — I7300 Raynaud's syndrome without gangrene: Secondary | ICD-10-CM | POA: Diagnosis not present

## 2019-01-11 DIAGNOSIS — M7989 Other specified soft tissue disorders: Secondary | ICD-10-CM | POA: Diagnosis not present

## 2019-01-11 DIAGNOSIS — M1990 Unspecified osteoarthritis, unspecified site: Secondary | ICD-10-CM | POA: Diagnosis not present

## 2019-01-11 DIAGNOSIS — R21 Rash and other nonspecific skin eruption: Secondary | ICD-10-CM | POA: Diagnosis not present

## 2019-01-28 DIAGNOSIS — R21 Rash and other nonspecific skin eruption: Secondary | ICD-10-CM | POA: Diagnosis not present

## 2019-01-28 DIAGNOSIS — M1990 Unspecified osteoarthritis, unspecified site: Secondary | ICD-10-CM | POA: Diagnosis not present

## 2019-01-28 DIAGNOSIS — I7300 Raynaud's syndrome without gangrene: Secondary | ICD-10-CM | POA: Diagnosis not present

## 2019-01-28 DIAGNOSIS — M7989 Other specified soft tissue disorders: Secondary | ICD-10-CM | POA: Diagnosis not present

## 2019-03-20 ENCOUNTER — Other Ambulatory Visit: Payer: Self-pay | Admitting: Registered Nurse

## 2019-03-20 DIAGNOSIS — I10 Essential (primary) hypertension: Secondary | ICD-10-CM

## 2019-03-20 NOTE — Telephone Encounter (Signed)
Requested medication (s) are due for refill today: yes  Requested medication (s) are on the active medication list: yes  Last refill:  11/19/2018  Future visit scheduled: no  Notes to clinic: Left a vm for patient to call and schedule a office visit.   Requested Prescriptions  Pending Prescriptions Disp Refills   amLODipine (NORVASC) 10 MG tablet [Pharmacy Med Name: AMLODIPINE BESYLATE 10MG  TABLETS] 120 tablet 0    Sig: TAKE 1 TABLET BY MOUTH DAILY     Cardiovascular:  Calcium Channel Blockers Failed - 03/20/2019  7:47 AM      Failed - Last BP in normal range    BP Readings from Last 1 Encounters:  02/09/18 (!) 164/86         Failed - Valid encounter within last 6 months    Recent Outpatient Visits          3 months ago Hyperlipidemia, unspecified hyperlipidemia type   [facility], MD   1 year ago Essential hypertension   [facility], PA-C   2 years ago Essential hypertension   [facility], PA-C   2 years ago Elevated blood pressure reading   [facility], PA-C   2 years ago Essential hypertension   [facility], Laurey Arrow, MD

## 2019-03-22 ENCOUNTER — Ambulatory Visit: Payer: BLUE CROSS/BLUE SHIELD | Admitting: Registered Nurse

## 2019-03-22 ENCOUNTER — Other Ambulatory Visit: Payer: Self-pay

## 2019-03-22 ENCOUNTER — Encounter: Payer: Self-pay | Admitting: Registered Nurse

## 2019-03-22 VITALS — BP 142/70 | HR 77 | Temp 98.8°F | Resp 16 | Ht 62.0 in | Wt 123.0 lb

## 2019-03-22 DIAGNOSIS — Z23 Encounter for immunization: Secondary | ICD-10-CM

## 2019-03-22 DIAGNOSIS — Z9189 Other specified personal risk factors, not elsewhere classified: Secondary | ICD-10-CM | POA: Diagnosis not present

## 2019-03-22 DIAGNOSIS — I10 Essential (primary) hypertension: Secondary | ICD-10-CM

## 2019-03-22 MED ORDER — ATORVASTATIN CALCIUM 40 MG PO TABS: 40.0000 mg | ORAL_TABLET | Freq: Every day | ORAL | 1 refills | Status: DC

## 2019-03-22 MED ORDER — AMLODIPINE BESYLATE 10 MG PO TABS: 10.0000 mg | ORAL_TABLET | Freq: Every day | ORAL | 1 refills | Status: DC

## 2019-03-22 NOTE — Patient Instructions (Signed)

## 2019-03-22 NOTE — Progress Notes (Signed)
Established Patient Office Visit  Subjective:  Patient ID: Alyssa Rhodes, female    DOB: 1960-12-23  Age: 58 y.o. MRN: [id number]  CC:  Chief Complaint  Patient presents with  . Medication Management    needs new pcp to manage medications and Chronic Conditions     HPI Alyssa Rhodes presents for med check  HTN: amlodipine 10mg  PO qd. Reports good effect. Denies headache, chest pain, shob, NVD, changes in vision  HLD: Has not taken atorvastatin since July. Lipid panel in May wnl - will restart statin today.  Otherwise no complaints. Feels good overall. Happy to continue current regimen.   Past Medical History:  Diagnosis Date  . Hyperlipidemia   . Hypertension   . Lupus ([place])   . Thyroid nodule 03/2010   aspiration    Past Surgical History:  Procedure Laterality Date  . ABDOMINAL HYSTERECTOMY    . CESAREAN SECTION    . SHOULDER SURGERY  2011   LEFT    Family History  Problem Relation Age of Onset  . Asthma Mother        doe not know mother well, sister advised her  . Diabetes Mother   . Heart disease Father        heart attack  . Hypertension Father     Social History   Socioeconomic History  . Marital status: Widowed    Spouse name: Not on file  . Number of children: 2  . Years of education: Not on file  . Highest education level: Not on file  Occupational History  . Not on file  Social Needs  . Financial resource strain: Not hard at all  . Food insecurity    Worry: Never true    Inability: Never true  . Transportation needs    Medical: No    Non-medical: No  Tobacco Use  . Smoking status: Current Every Day Smoker    Packs/day: 0.50    Years: 41.00    Pack years: 20.50    Types: Cigarettes  . Smokeless tobacco: Never Used  Substance and Sexual Activity  . Alcohol use: Yes    Alcohol/week: 3.0 standard drinks    Types: 3 Standard drinks or equivalent per week    Comment: 3/week  . Drug use: No  . Sexual activity: Not Currently  Lifestyle   . Physical activity    Days per week: 0 days    Minutes per session: 0 min  . Stress: Only a little  Relationships  . Social Herbalist on phone: Never    Gets together: Never    Attends religious service: More than 4 times per year    Active member of club or organization: No    Attends meetings of clubs or organizations: Never    Relationship status: Widowed  . Intimate partner violence    Fear of current or ex partner: No    Emotionally abused: No    Physically abused: No    Forced sexual activity: No  Other Topics Concern  . Not on file  Social History Narrative  . Not on file    Outpatient Medications Prior to Visit  Medication Sig Dispense Refill  . aspirin EC 81 MG tablet Take 81 mg by mouth daily.    . folic acid (FOLVITE) 1 MG tablet TAKE 1 TABLET BY MOUTH ONCE DAILY FOR 30 DAYS    . methotrexate 2.5 MG tablet TAKE 4 TABLETS BY MOUTH ONCE A WEEK    .  Multiple Vitamin (MULTIVITAMIN) tablet Take 1 tablet by mouth daily.    . Omega-3 Fatty Acids (FISH OIL PO) Take by mouth daily.    Marland Kitchen amLODipine (NORVASC) 10 MG tablet TAKE 1 TABLET BY MOUTH ONCE DAILY **DUE  FOR  FOLLOW  UP  APPOINTMENT 7 tablet 0  . amLODipine (NORVASC) 10 MG tablet Take 1 tablet (10 mg total) by mouth daily. 120 tablet 0  . atorvastatin (LIPITOR) 40 MG tablet Take 1 tablet (40 mg total) by mouth daily. 90 tablet 3   No facility-administered medications prior to visit.     Allergies  Allergen Reactions  . Codeine Nausea And Vomiting    ROS Review of Systems  Constitutional: Negative.  Negative for unexpected weight change.  HENT: Negative.   Eyes: Negative.  Negative for visual disturbance.  Respiratory: Negative.   Cardiovascular: Negative.   Gastrointestinal: Negative.  Negative for diarrhea, nausea and vomiting.  Endocrine: Negative.   Genitourinary: Negative.   Musculoskeletal: Negative.   Skin: Negative.   Allergic/Immunologic: Negative.   Neurological: Negative.  Negative  for dizziness and headaches.  Hematological: Negative.   Psychiatric/Behavioral: Negative.   All other systems reviewed and are negative.     Objective:    Physical Exam  Constitutional: She is oriented to person, place, and time. She appears well-developed and well-nourished. No distress.  Cardiovascular: Normal rate and regular rhythm.  Pulmonary/Chest: Effort normal. No respiratory distress.  Neurological: She is alert and oriented to person, place, and time.  Skin: Skin is warm and dry. No rash noted. She is not diaphoretic. No erythema. No pallor.  Psychiatric: She has a normal mood and affect. Her behavior is normal. Judgment and thought content normal.  Nursing note and vitals reviewed.   BP (!) 142/70   Pulse 77   Temp 98.8 F (37.1 C) (Oral)   Resp 16   Ht 5\' 2"  (1.575 m)   Wt 123 lb (55.8 kg)   SpO2 98%   BMI 22.50 kg/m  Wt Readings from Last 3 Encounters:  03/22/19 123 lb (55.8 kg)  02/09/18 117 lb 9.6 oz (53.3 kg)  12/02/16 116 lb 3.2 oz (52.7 kg)     Health Maintenance Due  Topic Date Due  . Hepatitis C Screening  1961-06-16  . HIV Screening  01/04/1976  . PAP SMEAR-Modifier  01/03/1982  . MAMMOGRAM  01/04/2011  . COLONOSCOPY  01/04/2011  . INFLUENZA VACCINE  02/23/2019    There are no preventive care reminders to display for this patient.  Lab Results  Component Value Date   TSH 0.718 11/24/2016   Lab Results  Component Value Date   WBC 7.8 11/23/2018   HGB 14.1 11/23/2018   HCT 40.8 11/23/2018   MCV 93 11/23/2018   PLT 441 11/23/2018   Lab Results  Component Value Date   NA 137 11/23/2018   K 3.7 11/23/2018   CO2 25 11/23/2018   GLUCOSE 131 (H) 11/23/2018   BUN 14 11/23/2018   CREATININE 0.57 11/23/2018   BILITOT 0.3 11/24/2016   ALKPHOS 75 11/24/2016   AST 27 11/24/2016   ALT 20 11/24/2016   PROT 7.2 11/24/2016   ALBUMIN 4.5 02/09/2018   CALCIUM 9.6 11/23/2018   Lab Results  Component Value Date   CHOL 112 11/23/2018    Lab Results  Component Value Date   HDL 47 11/23/2018   Lab Results  Component Value Date   LDLCALC 44 11/23/2018   Lab Results  Component Value Date  TRIG 106 11/23/2018   Lab Results  Component Value Date   CHOLHDL 2.4 11/23/2018   Lab Results  Component Value Date   HGBA1C 6.1 (H) 11/23/2018      Assessment & Plan:   Problem List Items Addressed This Visit      Cardiovascular and Mediastinum   HTN (hypertension) (Chronic)   Relevant Medications   amLODipine (NORVASC) 10 MG tablet   atorvastatin (LIPITOR) 40 MG tablet    Other Visit Diagnoses    Flu vaccine need    -  Primary   Relevant Orders   Flu Vaccine QUAD 36+ mos IM (Completed)   High risk of cardiac event       Starting statin.  She is taking ASA.    Relevant Medications   atorvastatin (LIPITOR) 40 MG tablet      Meds ordered this encounter  Medications  . amLODipine (NORVASC) 10 MG tablet    Sig: Take 1 tablet (10 mg total) by mouth daily.    Dispense:  90 tablet    Refill:  1    Order Specific Question:   Supervising Provider    Answer:   Delia Chimes A [id number]  . atorvastatin (LIPITOR) 40 MG tablet    Sig: Take 1 tablet (40 mg total) by mouth daily.    Dispense:  90 tablet    Refill:  1    Order Specific Question:   Supervising Provider    Answer:   Forrest Moron [id number]    Follow-up: Return in about 6 months (around 09/22/2019) for CPE, labs.   PLAN  Meds refilled x 6 mos  Return for CPE and labs  Flu vaccine given today  Patient encouraged to call clinic with any questions, comments, or concerns.   Maximiano Coss, NP

## 2019-04-09 DIAGNOSIS — M1990 Unspecified osteoarthritis, unspecified site: Secondary | ICD-10-CM | POA: Diagnosis not present

## 2019-04-09 DIAGNOSIS — I7300 Raynaud's syndrome without gangrene: Secondary | ICD-10-CM | POA: Diagnosis not present

## 2019-04-09 DIAGNOSIS — M7989 Other specified soft tissue disorders: Secondary | ICD-10-CM | POA: Diagnosis not present

## 2019-06-11 DIAGNOSIS — R21 Rash and other nonspecific skin eruption: Secondary | ICD-10-CM | POA: Diagnosis not present

## 2019-06-11 DIAGNOSIS — I7300 Raynaud's syndrome without gangrene: Secondary | ICD-10-CM | POA: Diagnosis not present

## 2019-06-11 DIAGNOSIS — M7989 Other specified soft tissue disorders: Secondary | ICD-10-CM | POA: Diagnosis not present

## 2019-06-11 DIAGNOSIS — M1990 Unspecified osteoarthritis, unspecified site: Secondary | ICD-10-CM | POA: Diagnosis not present

## 2019-07-19 ENCOUNTER — Other Ambulatory Visit: Payer: Self-pay | Admitting: Registered Nurse

## 2019-07-19 DIAGNOSIS — I10 Essential (primary) hypertension: Secondary | ICD-10-CM

## 2019-07-31 ENCOUNTER — Other Ambulatory Visit: Payer: Self-pay | Admitting: Registered Nurse

## 2019-07-31 DIAGNOSIS — I10 Essential (primary) hypertension: Secondary | ICD-10-CM

## 2019-09-11 DIAGNOSIS — L299 Pruritus, unspecified: Secondary | ICD-10-CM | POA: Diagnosis not present

## 2019-09-11 DIAGNOSIS — M1990 Unspecified osteoarthritis, unspecified site: Secondary | ICD-10-CM | POA: Diagnosis not present

## 2019-09-11 DIAGNOSIS — I7300 Raynaud's syndrome without gangrene: Secondary | ICD-10-CM | POA: Diagnosis not present

## 2019-09-11 DIAGNOSIS — R21 Rash and other nonspecific skin eruption: Secondary | ICD-10-CM | POA: Diagnosis not present

## 2019-09-23 ENCOUNTER — Encounter: Payer: Self-pay | Admitting: Registered Nurse

## 2019-09-23 ENCOUNTER — Ambulatory Visit (INDEPENDENT_AMBULATORY_CARE_PROVIDER_SITE_OTHER): Payer: BC Managed Care – PPO | Admitting: Registered Nurse

## 2019-09-23 ENCOUNTER — Other Ambulatory Visit: Payer: Self-pay

## 2019-09-23 VITALS — BP 136/88 | HR 106 | Temp 98.0°F | Ht 62.0 in | Wt 119.2 lb

## 2019-09-23 DIAGNOSIS — Z13228 Encounter for screening for other metabolic disorders: Secondary | ICD-10-CM | POA: Diagnosis not present

## 2019-09-23 DIAGNOSIS — Z1329 Encounter for screening for other suspected endocrine disorder: Secondary | ICD-10-CM | POA: Diagnosis not present

## 2019-09-23 DIAGNOSIS — Z130 Encounter for screening for diseases of the blood and blood-forming organs and certain disorders involving the immune mechanism: Secondary | ICD-10-CM

## 2019-09-23 DIAGNOSIS — E785 Hyperlipidemia, unspecified: Secondary | ICD-10-CM

## 2019-09-23 DIAGNOSIS — Z9189 Other specified personal risk factors, not elsewhere classified: Secondary | ICD-10-CM | POA: Diagnosis not present

## 2019-09-23 DIAGNOSIS — Z0000 Encounter for general adult medical examination without abnormal findings: Secondary | ICD-10-CM

## 2019-09-23 DIAGNOSIS — I10 Essential (primary) hypertension: Secondary | ICD-10-CM

## 2019-09-23 DIAGNOSIS — Z0001 Encounter for general adult medical examination with abnormal findings: Secondary | ICD-10-CM

## 2019-09-23 MED ORDER — ATORVASTATIN CALCIUM 40 MG PO TABS: 40.0000 mg | ORAL_TABLET | Freq: Every day | ORAL | 1 refills | Status: DC

## 2019-09-23 MED ORDER — AMLODIPINE BESYLATE 10 MG PO TABS: 10.0000 mg | ORAL_TABLET | Freq: Every day | ORAL | 1 refills | Status: DC

## 2019-09-23 NOTE — Patient Instructions (Addendum)
   If you have lab work done today you will be contacted with your lab results within the next 2 weeks.  If you have not heard from us then please contact us. The fastest way to get your results is to register for My Chart.   IF you received an x-ray today, you will receive an invoice from [facility]. Please contact [facility] at [phone] with questions or concerns regarding your invoice.   IF you received labwork today, you will receive an invoice from [facility]. Please contact [facility] at [phone] with questions or concerns regarding your invoice.   Our billing staff will not be able to assist you with questions regarding bills from these companies.  You will be contacted with the lab results as soon as they are available. The fastest way to get your results is to activate your My Chart account. Instructions are located on the last page of this paperwork. If you have not heard from us regarding the results in 2 weeks, please contact this office.      Health Maintenance, Female Adopting a healthy lifestyle and getting preventive care are important in promoting health and wellness. Ask your health care provider about:  The right schedule for you to have regular tests and exams.  Things you can do on your own to prevent diseases and keep yourself healthy. What should I know about diet, weight, and exercise? Eat a healthy diet   Eat a diet that includes plenty of vegetables, fruits, low-fat dairy products, and lean protein.  Do not eat a lot of foods that are high in solid fats, added sugars, or sodium. Maintain a healthy weight Body mass index (BMI) is used to identify weight problems. It estimates body fat based on height and weight. Your health care provider can help determine your BMI and help you achieve or maintain a healthy weight. Get regular exercise Get regular exercise. This is one of the most important things you can do for your health. Most  adults should:  Exercise for at least 150 minutes each week. The exercise should increase your heart rate and make you sweat (moderate-intensity exercise).  Do strengthening exercises at least twice a week. This is in addition to the moderate-intensity exercise.  Spend less time sitting. Even light physical activity can be beneficial. Watch cholesterol and blood lipids Have your blood tested for lipids and cholesterol at 59 years of age, then have this test every 5 years. Have your cholesterol levels checked more often if:  Your lipid or cholesterol levels are high.  You are older than 59 years of age.  You are at high risk for heart disease. What should I know about cancer screening? Depending on your health history and family history, you may need to have cancer screening at various ages. This may include screening for:  Breast cancer.  Cervical cancer.  Colorectal cancer.  Skin cancer.  Lung cancer. What should I know about heart disease, diabetes, and high blood pressure? Blood pressure and heart disease  High blood pressure causes heart disease and increases the risk of stroke. This is more likely to develop in people who have high blood pressure readings, are of African descent, or are overweight.  Have your blood pressure checked: ? Every 3-5 years if you are 18-39 years of age. ? Every year if you are 40 years old or older. Diabetes Have regular diabetes screenings. This checks your fasting blood sugar level. Have the screening done:  Once every   three years after age 40 if you are at a normal weight and have a low risk for diabetes.  More often and at a younger age if you are overweight or have a high risk for diabetes. What should I know about preventing infection? Hepatitis B If you have a higher risk for hepatitis B, you should be screened for this virus. Talk with your health care provider to find out if you are at risk for hepatitis B infection. Hepatitis  C Testing is recommended for:  Everyone born from 1945 through 1965.  Anyone with known risk factors for hepatitis C. Sexually transmitted infections (STIs)  Get screened for STIs, including gonorrhea and chlamydia, if: ? You are sexually active and are younger than 59 years of age. ? You are older than 59 years of age and your health care provider tells you that you are at risk for this type of infection. ? Your sexual activity has changed since you were last screened, and you are at increased risk for chlamydia or gonorrhea. Ask your health care provider if you are at risk.  Ask your health care provider about whether you are at high risk for HIV. Your health care provider may recommend a prescription medicine to help prevent HIV infection. If you choose to take medicine to prevent HIV, you should first get tested for HIV. You should then be tested every 3 months for as long as you are taking the medicine. Pregnancy  If you are about to stop having your period (premenopausal) and you may become pregnant, seek counseling before you get pregnant.  Take 400 to 800 micrograms (mcg) of folic acid every day if you become pregnant.  Ask for birth control (contraception) if you want to prevent pregnancy. Osteoporosis and menopause Osteoporosis is a disease in which the bones lose minerals and strength with aging. This can result in bone fractures. If you are 65 years old or older, or if you are at risk for osteoporosis and fractures, ask your health care provider if you should:  Be screened for bone loss.  Take a calcium or vitamin D supplement to lower your risk of fractures.  Be given hormone replacement therapy (HRT) to treat symptoms of menopause. Follow these instructions at home: Lifestyle  Do not use any products that contain nicotine or tobacco, such as cigarettes, e-cigarettes, and chewing tobacco. If you need help quitting, ask your health care provider.  Do not use street  drugs.  Do not share needles.  Ask your health care provider for help if you need support or information about quitting drugs. Alcohol use  Do not drink alcohol if: ? Your health care provider tells you not to drink. ? You are pregnant, may be pregnant, or are planning to become pregnant.  If you drink alcohol: ? Limit how much you use to 0-1 drink a day. ? Limit intake if you are breastfeeding.  Be aware of how much alcohol is in your drink. In the [place] one drink equals one 12 oz bottle of beer (355 mL), one 5 oz glass of wine (148 mL), or one 1 oz glass of hard liquor (44 mL). General instructions  Schedule regular health, dental, and eye exams.  Stay current with your vaccines.  Tell your health care provider if: ? You often feel depressed. ? You have ever been abused or do not feel safe at home. Summary  Adopting a healthy lifestyle and getting preventive care are important in promoting health and wellness.    wellness.  Follow your health care provider's instructions about healthy diet, exercising, and getting tested or screened for diseases.  Follow your health care provider's instructions on monitoring your cholesterol and blood pressure. This information is not intended to replace advice given to you by your health care provider. Make sure you discuss any questions you have with your health care provider. Document Revised: 07/04/2018 Document Reviewed: 07/04/2018 Elsevier Patient Education  2020 [employer] Inc.     Why follow it? Research shows. . Those who follow the Mediterranean diet have a reduced risk of heart disease  . The diet is associated with a reduced incidence of Parkinson's and Alzheimer's diseases . People following the diet may have longer life expectancies and lower rates of chronic diseases  . The Dietary Guidelines for Americans recommends the Mediterranean diet as an eating plan to promote health and prevent disease  What Is the Mediterranean Diet?   . Healthy eating plan based on typical foods and recipes of Mediterranean-style cooking . The diet is primarily a plant based diet; these foods should make up a majority of meals   Starches - Plant based foods should make up a majority of meals - They are an important sources of vitamins, minerals, energy, antioxidants, and fiber - Choose whole grains, foods high in fiber and minimally processed items  - Typical grain sources include wheat, oats, barley, corn, brown rice, bulgar, farro, millet, polenta, couscous  - Various types of beans include chickpeas, lentils, fava beans, black beans, white beans   Fruits  Veggies - Large quantities of antioxidant rich fruits & veggies; 6 or more servings  - Vegetables can be eaten raw or lightly drizzled with oil and cooked  - Vegetables common to the traditional Mediterranean Diet include: artichokes, arugula, beets, broccoli, brussel sprouts, cabbage, carrots, celery, collard greens, cucumbers, eggplant, kale, leeks, lemons, lettuce, mushrooms, okra, onions, peas, peppers, potatoes, pumpkin, radishes, rutabaga, shallots, spinach, sweet potatoes, turnips, zucchini - Fruits common to the Mediterranean Diet include: apples, apricots, avocados, cherries, clementines, dates, figs, grapefruits, grapes, melons, nectarines, oranges, peaches, pears, pomegranates, strawberries, tangerines  Fats - Replace butter and margarine with healthy oils, such as olive oil, canola oil, and tahini  - Limit nuts to no more than a handful a day  - Nuts include walnuts, almonds, pecans, pistachios, pine nuts  - Limit or avoid candied, honey roasted or heavily salted nuts - Olives are central to the Mediterranean diet - can be eaten whole or used in a variety of dishes   Meats Protein - Limiting red meat: no more than a few times a month - When eating red meat: choose lean cuts and keep the portion to the size of deck of cards - Eggs: approx. 0 to 4 times a week  - Fish and lean  poultry: at least 2 a week  - Healthy protein sources include, chicken, turkey, lean beef, lamb - Increase intake of seafood such as tuna, salmon, trout, mackerel, shrimp, scallops - Avoid or limit high fat processed meats such as sausage and bacon  Dairy - Include moderate amounts of low fat dairy products  - Focus on healthy dairy such as fat free yogurt, skim milk, low or reduced fat cheese - Limit dairy products higher in fat such as whole or 2% milk, cheese, ice cream  Alcohol - Moderate amounts of red wine is ok  - No more than 5 oz daily for women (all ages) and men older than age 65  - No   more than 10 oz of wine daily for men younger than 65  Other - Limit sweets and other desserts  - Use herbs and spices instead of salt to flavor foods  - Herbs and spices common to the traditional Mediterranean Diet include: basil, bay leaves, chives, cloves, cumin, fennel, garlic, lavender, marjoram, mint, oregano, parsley, pepper, rosemary, sage, savory, sumac, tarragon, thyme   It's not just a diet, it's a lifestyle:  . The Mediterranean diet includes lifestyle factors typical of those in the region  . Foods, drinks and meals are best eaten with others and savored . Daily physical activity is important for overall good health . This could be strenuous exercise like running and aerobics . This could also be more leisurely activities such as walking, housework, yard-work, or taking the stairs . Moderation is the key; a balanced and healthy diet accommodates most foods and drinks . Consider portion sizes and frequency of consumption of certain foods   Meal Ideas & Options:  . Breakfast:  o Whole wheat toast or whole wheat English muffins with peanut butter & hard boiled egg o Steel cut oats topped with apples & cinnamon and skim milk  o Fresh fruit: banana, strawberries, melon, berries, peaches  o Smoothies: strawberries, bananas, greek yogurt, peanut butter o Low fat greek yogurt with  blueberries and granola  o Egg white omelet with spinach and mushrooms o Breakfast couscous: whole wheat couscous, apricots, skim milk, cranberries  . Sandwiches:  o Hummus and grilled vegetables (peppers, zucchini, squash) on whole wheat bread   o Grilled chicken on whole wheat pita with lettuce, tomatoes, cucumbers or tzatziki  o Tuna salad on whole wheat bread: tuna salad made with greek yogurt, olives, red peppers, capers, green onions o Garlic rosemary lamb pita: lamb sauted with garlic, rosemary, salt & pepper; add lettuce, cucumber, greek yogurt to pita - flavor with lemon juice and black pepper  . Seafood:  o Mediterranean grilled salmon, seasoned with garlic, basil, parsley, lemon juice and black pepper o Shrimp, lemon, and spinach whole-grain pasta salad made with low fat greek yogurt  o Seared scallops with lemon orzo  o Seared tuna steaks seasoned salt, pepper, coriander topped with tomato mixture of olives, tomatoes, olive oil, minced garlic, parsley, green onions and cappers  . Meats:  o Herbed greek chicken salad with kalamata olives, cucumber, feta  o Red bell peppers stuffed with spinach, bulgur, lean ground beef (or lentils) & topped with feta   o Kebabs: skewers of chicken, tomatoes, onions, zucchini, squash  o Turkey burgers: made with red onions, mint, dill, lemon juice, feta cheese topped with roasted red peppers . Vegetarian o Cucumber salad: cucumbers, artichoke hearts, celery, red onion, feta cheese, tossed in olive oil & lemon juice  o Hummus and whole grain pita points with a greek salad (lettuce, tomato, feta, olives, cucumbers, red onion) o Lentil soup with celery, carrots made with vegetable broth, garlic, salt and pepper  o Tabouli salad: parsley, bulgur, mint, scallions, cucumbers, tomato, radishes, lemon juice, olive oil, salt and pepper.       Fat and Cholesterol Restricted Eating Plan Eating a diet that limits fat and cholesterol may help lower your risk  for heart disease and other conditions. Your body needs fat and cholesterol for basic functions, but eating too much of these things can be harmful to your health. Your health care provider may order lab tests to check your blood fat (lipid) and cholesterol levels. This helps your health   care provider understand your risk for certain conditions and whether you need to make diet changes. Work with your health care provider or dietitian to make an eating plan that is right for you. Your plan includes:  Limit your fat intake to ______% or less of your total calories a day.  Limit your saturated fat intake to ______% or less of your total calories a day.  Limit the amount of cholesterol in your diet to less than _________mg a day.  Eat ___________ g of fiber a day. What are tips for following this plan? General guidelines   If you are overweight, work with your health care provider to lose weight safely. Losing just 5-10% of your body weight can improve your overall health and help prevent diseases such as diabetes and heart disease.  Avoid: ? Foods with added sugar. ? Fried foods. ? Foods that contain partially hydrogenated oils, including stick margarine, some tub margarines, cookies, crackers, and other baked goods.  Limit alcohol intake to no more than 1 drink a day for nonpregnant women and 2 drinks a day for men. One drink equals 12 oz of beer, 5 oz of wine, or 1 oz of hard liquor. Reading food labels  Check food labels for: ? Trans fats, partially hydrogenated oils, or high amounts of saturated fat. Avoid foods that contain saturated fat and trans fat. ? The amount of cholesterol in each serving. Try to eat no more than 200 mg of cholesterol each day. ? The amount of fiber in each serving. Try to eat at least 20-30 g of fiber each day.  Choose foods with healthy fats, such as: ? Monounsaturated and polyunsaturated fats. These include olive and canola oil, flaxseeds, walnuts,  almonds, and seeds. ? Omega-3 fats. These are found in foods such as salmon, mackerel, sardines, tuna, flaxseed oil, and ground flaxseeds.  Choose grain products that have whole grains. Look for the word "whole" as the first word in the ingredient list. Cooking  Cook foods using methods other than frying. Baking, boiling, grilling, and broiling are some healthy options.  Eat more home-cooked food and less restaurant, buffet, and fast food.  Avoid cooking using saturated fats. ? Animal sources of saturated fats include meats, butter, and cream. ? Plant sources of saturated fats include palm oil, palm kernel oil, and coconut oil. Meal planning   At meals, imagine dividing your plate into fourths: ? Fill one-half of your plate with vegetables and green salads. ? Fill one-fourth of your plate with whole grains. ? Fill one-fourth of your plate with lean protein foods.  Eat fish that is high in omega-3 fats at least two times a week.  Eat more foods that contain fiber, such as whole grains, beans, apples, broccoli, carrots, peas, and barley. These foods help promote healthy cholesterol levels in the blood. Recommended foods Grains  Whole grains, such as whole wheat or whole grain breads, crackers, cereals, and pasta. Unsweetened oatmeal, bulgur, barley, quinoa, or brown rice. Corn or whole wheat flour tortillas. Vegetables  Fresh or frozen vegetables (raw, steamed, roasted, or grilled). Green salads. Fruits  All fresh, canned (in natural juice), or frozen fruits. Meats and other protein foods  Ground beef (85% or leaner), grass-fed beef, or beef trimmed of fat. Skinless chicken or turkey. Ground chicken or turkey. Pork trimmed of fat. All fish and seafood. Egg whites. Dried beans, peas, or lentils. Unsalted nuts or seeds. Unsalted canned beans. Natural nut butters without added sugar and oil. Dairy    Low-fat or nonfat dairy products, such as skim or 1% milk, 2% or reduced-fat cheeses,  low-fat and fat-free ricotta or cottage cheese, or plain low-fat and nonfat yogurt. Fats and oils  Tub margarine without trans fats. Light or reduced-fat mayonnaise and salad dressings. Avocado. Olive, canola, sesame, or safflower oils. The items listed above may not be a complete list of recommended foods or beverages. Contact your dietitian for more options. Foods to avoid Grains  White bread. White pasta. White rice. Cornbread. Bagels, pastries, and croissants. Crackers and snack foods that contain trans fat and hydrogenated oils. Vegetables  Vegetables cooked in cheese, cream, or butter sauce. Fried vegetables. Fruits  Canned fruit in heavy syrup. Fruit in cream or butter sauce. Fried fruit. Meats and other protein foods  Fatty cuts of meat. Ribs, chicken wings, bacon, sausage, bologna, salami, chitterlings, fatback, hot dogs, bratwurst, and packaged lunch meats. Liver and organ meats. Whole eggs and egg yolks. Chicken and turkey with skin. Fried meat. Dairy  Whole or 2% milk, cream, half-and-half, and cream cheese. Whole milk cheeses. Whole-fat or sweetened yogurt. Full-fat cheeses. Nondairy creamers and whipped toppings. Processed cheese, cheese spreads, and cheese curds. Beverages  Alcohol. Sugar-sweetened drinks such as sodas, lemonade, and fruit drinks. Fats and oils  Butter, stick margarine, lard, shortening, ghee, or bacon fat. Coconut, palm kernel, and palm oils. Sweets and desserts  Corn syrup, sugars, honey, and molasses. Candy. Jam and jelly. Syrup. Sweetened cereals. Cookies, pies, cakes, donuts, muffins, and ice cream. The items listed above may not be a complete list of foods and beverages to avoid. Contact your dietitian for more information. Summary  Your body needs fat and cholesterol for basic functions. However, eating too much of these things can be harmful to your health.  Work with your health care provider and dietitian to follow a diet low in fat and  cholesterol. Doing this may help lower your risk for heart disease and other conditions.  Choose healthy fats, such as monounsaturated and polyunsaturated fats, and foods high in omega-3 fatty acids.  Eat fiber-rich foods, such as whole grains, beans, peas, fruits, and vegetables.  Limit or avoid alcohol, fried foods, and foods high in saturated fats, partially hydrogenated oils, and sugar. This information is not intended to replace advice given to you by your health care provider. Make sure you discuss any questions you have with your health care provider. Document Revised: 06/23/2017 Document Reviewed: 03/28/2017 Elsevier Patient Education  2020 [employer] Inc.  American Heart Association (AHA) Exercise Recommendation  Being physically active is important to prevent heart disease and stroke, the nation's No. 1and No. 5killers. To improve overall cardiovascular health, we suggest at least 150 minutes per week of moderate exercise or 75 minutes per week of vigorous exercise (or a combination of moderate and vigorous activity). Thirty minutes a day, five times a week is an easy goal to remember. You will also experience benefits even if you divide your time into two or three segments of 10 to 15 minutes per day.  For people who would benefit from lowering their blood pressure or cholesterol, we recommend 40 minutes of aerobic exercise of moderate to vigorous intensity three to four times a week to lower the risk for heart attack and stroke.  Physical activity is anything that makes you move your body and burn calories.  This includes things like climbing stairs or playing sports. Aerobic exercises benefit your heart, and include walking, jogging, swimming or biking. Strength and stretching exercises are   best for overall stamina and flexibility.  The simplest, positive change you can make to effectively improve your heart health is to start walking. It's enjoyable, free, easy, social and great  exercise. A walking program is flexible and boasts high success rates because people can stick with it. It's easy for walking to become a regular and satisfying part of life.   For Overall Cardiovascular Health:  At least 30 minutes of moderate-intensity aerobic activity at least 5 days per week for a total of 150  OR   At least 25 minutes of vigorous aerobic activity at least 3 days per week for a total of 75 minutes; or a combination of moderate- and vigorous-intensity aerobic activity  AND   Moderate- to high-intensity muscle-strengthening activity at least 2 days per week for additional health benefits.  For Lowering Blood Pressure and Cholesterol  An average 40 minutes of moderate- to vigorous-intensity aerobic activity 3 or 4 times per week  What if I can't make it to the time goal? Something is always better than nothing! And everyone has to start somewhere. Even if you've been sedentary for years, today is the day you can begin to make healthy changes in your life. If you don't think you'll make it for 30 or 40 minutes, set a reachable goal for today. You can work up toward your overall goal by increasing your time as you get stronger. Don't let all-or-nothing thinking rob you of doing what you can every day.  Source:http://www.heart.org    

## 2019-09-23 NOTE — Progress Notes (Signed)
Established Patient Office Visit  Subjective:  Patient ID: Alyssa Rhodes, female    DOB: 29-Jun-1961  Age: 59 y.o. MRN: [id number]  CC:  Chief Complaint  Patient presents with  . Annual Exam    physical  . Medication Refill    amlodipine and atorvastatin    HPI Alyssa Rhodes presents for CPE, labs, and med refill on atorvastatin and amlodipine.  HTN: Treated with amlodipine 10mg  PO qd. Denies CV concerns at this time. BP mildly elevated on arrival, returned to normal limits rapidly.   HLD: Endorses heart healthy diet. Feels she is tolerating atorvastatin 40mg  PO qd well. Took her last pill yesterday, has now run out.  Otherwise no concerns. She is beginning to consider quitting smoking due to the high cost of cigarettes. She has smoked between 0.5-1 ppd for a number of decades. We discussed that should she definitively decide to quit, there are a number of pharmacological supports available.   Past Medical History:  Diagnosis Date  . Hyperlipidemia   . Hypertension   . Lupus ([place])   . Thyroid nodule 03/2010   aspiration    Past Surgical History:  Procedure Laterality Date  . ABDOMINAL HYSTERECTOMY    . CESAREAN SECTION    . SHOULDER SURGERY  2011   LEFT    Family History  Problem Relation Age of Onset  . Asthma Mother        doe not know mother well, sister advised her  . Diabetes Mother   . Heart disease Father        heart attack  . Hypertension Father     Social History   Socioeconomic History  . Marital status: Widowed    Spouse name: Not on file  . Number of children: 2  . Years of education: Not on file  . Highest education level: Not on file  Occupational History  . Not on file  Tobacco Use  . Smoking status: Current Every Day Smoker    Packs/day: 0.50    Years: 41.00    Pack years: 20.50    Types: Cigarettes  . Smokeless tobacco: Never Used  Substance and Sexual Activity  . Alcohol use: Yes    Alcohol/week: 3.0 standard drinks    Types: 3  Standard drinks or equivalent per week    Comment: 3/week  . Drug use: No  . Sexual activity: Not Currently  Other Topics Concern  . Not on file  Social History Narrative  . Not on file   Social Determinants of Health   Financial Resource Strain: Low Risk   . Difficulty of Paying Living Expenses: Not hard at all  Food Insecurity: No Food Insecurity  . Worried About Charity fundraiser in the Last Year: Never true  . Ran Out of Food in the Last Year: Never true  Transportation Needs: No Transportation Needs  . Lack of Transportation (Medical): No  . Lack of Transportation (Non-Medical): No  Physical Activity: Inactive  . Days of Exercise per Week: 0 days  . Minutes of Exercise per Session: 0 min  Stress: No Stress Concern Present  . Feeling of Stress : Only a little  Social Connections: Moderately Isolated  . Frequency of Communication with Friends and Family: Never  . Frequency of Social Gatherings with Friends and Family: Never  . Attends Religious Services: More than 4 times per year  . Active Member of Clubs or Organizations: No  . Attends Archivist Meetings: Never  .  Marital Status: Widowed  Intimate Partner Violence: Not At Risk  . Fear of Current or Ex-Partner: No  . Emotionally Abused: No  . Physically Abused: No  . Sexually Abused: No    Outpatient Medications Prior to Visit  Medication Sig Dispense Refill  . aspirin EC 81 MG tablet Take 81 mg by mouth daily.    . folic acid (FOLVITE) 1 MG tablet TAKE 1 TABLET BY MOUTH ONCE DAILY FOR 30 DAYS    . methotrexate 2.5 MG tablet TAKE 4 TABLETS BY MOUTH ONCE A WEEK    . Multiple Vitamin (MULTIVITAMIN) tablet Take 1 tablet by mouth daily.    . Omega-3 Fatty Acids (FISH OIL PO) Take by mouth daily.    Marland Kitchen amLODipine (NORVASC) 10 MG tablet TAKE 1 TABLET BY MOUTH DAILY 120 tablet 0  . atorvastatin (LIPITOR) 40 MG tablet Take 1 tablet (40 mg total) by mouth daily. 90 tablet 1   No facility-administered medications  prior to visit.    Allergies  Allergen Reactions  . Codeine Nausea And Vomiting    ROS Review of Systems  Constitutional: Negative.   HENT: Negative.   Eyes: Negative.   Respiratory: Negative.   Cardiovascular: Negative.   Gastrointestinal: Negative.   Endocrine: Negative.   Genitourinary: Negative.   Musculoskeletal: Negative.   Skin: Negative.   Allergic/Immunologic: Negative.   Neurological: Negative.   Hematological: Negative.   Psychiatric/Behavioral: Negative.   All other systems reviewed and are negative.     Objective:    Physical Exam  Constitutional: She is oriented to person, place, and time. She appears well-developed and well-nourished. No distress.  HENT:  Head: Normocephalic and atraumatic.  Right Ear: External ear normal.  Left Ear: External ear normal.  Nose: Nose normal.  Mouth/Throat: Oropharynx is clear and moist. No oropharyngeal exudate.  Eyes: Pupils are equal, round, and reactive to light. Conjunctivae and EOM are normal. Right eye exhibits no discharge. Left eye exhibits no discharge. No scleral icterus.  Neck: No tracheal deviation present. No thyromegaly present.  Cardiovascular: Normal rate, regular rhythm, normal heart sounds and intact distal pulses. Exam reveals no gallop and no friction rub.  No murmur heard. Pulmonary/Chest: Effort normal and breath sounds normal. No respiratory distress. She has no wheezes. She has no rales. She exhibits no tenderness.  Abdominal: Soft. Bowel sounds are normal. She exhibits no distension and no mass. There is no abdominal tenderness. There is no rebound and no guarding.  Musculoskeletal:        General: No tenderness, deformity or edema. Normal range of motion.     Cervical back: Normal range of motion and neck supple.  Lymphadenopathy:    She has no cervical adenopathy.  Neurological: She is alert and oriented to person, place, and time. No cranial nerve deficit. She exhibits normal muscle tone.  Coordination normal.  Skin: Skin is warm and dry. No rash noted. She is not diaphoretic. No erythema. No pallor.  Psychiatric: She has a normal mood and affect. Her behavior is normal. Judgment and thought content normal.  Nursing note and vitals reviewed.   BP 136/88   Pulse (!) 106   Temp 98 F (36.7 C) (Temporal)   Ht 5\' 2"  (1.575 m)   Wt 119 lb 3.2 oz (54.1 kg)   SpO2 97%   BMI 21.80 kg/m  Wt Readings from Last 3 Encounters:  09/23/19 119 lb 3.2 oz (54.1 kg)  03/22/19 123 lb (55.8 kg)  02/09/18 117 lb 9.6 oz (  53.3 kg)     Health Maintenance Due  Topic Date Due  . Hepatitis C Screening  08/[birth year]/1960  . MAMMOGRAM  01/04/2011    There are no preventive care reminders to display for this patient.  Lab Results  Component Value Date   TSH 0.718 11/24/2016   Lab Results  Component Value Date   WBC 7.8 11/23/2018   HGB 14.1 11/23/2018   HCT 40.8 11/23/2018   MCV 93 11/23/2018   PLT 441 11/23/2018   Lab Results  Component Value Date   NA 137 11/23/2018   K 3.7 11/23/2018   CO2 25 11/23/2018   GLUCOSE 131 (H) 11/23/2018   BUN 14 11/23/2018   CREATININE 0.57 11/23/2018   BILITOT 0.3 11/24/2016   ALKPHOS 75 11/24/2016   AST 27 11/24/2016   ALT 20 11/24/2016   PROT 7.2 11/24/2016   ALBUMIN 4.5 02/09/2018   CALCIUM 9.6 11/23/2018   Lab Results  Component Value Date   CHOL 112 11/23/2018   Lab Results  Component Value Date   HDL 47 11/23/2018   Lab Results  Component Value Date   LDLCALC 44 11/23/2018   Lab Results  Component Value Date   TRIG 106 11/23/2018   Lab Results  Component Value Date   CHOLHDL 2.4 11/23/2018   Lab Results  Component Value Date   HGBA1C 6.1 (H) 11/23/2018      Assessment & Plan:   Problem List Items Addressed This Visit      Cardiovascular and Mediastinum   HTN (hypertension) (Chronic)   Relevant Medications   amLODipine (NORVASC) 10 MG tablet   atorvastatin (LIPITOR) 40 MG tablet   Other Relevant Orders   CBC  with Differential   Hemoglobin A1c   Comprehensive metabolic panel    Other Visit Diagnoses    Hyperlipidemia, unspecified hyperlipidemia type    -  Primary   Relevant Medications   amLODipine (NORVASC) 10 MG tablet   atorvastatin (LIPITOR) 40 MG tablet   Other Relevant Orders   Lipid Panel   High risk of cardiac event       Starting statin.  She is taking ASA.    Relevant Medications   atorvastatin (LIPITOR) 40 MG tablet   Screening for endocrine, metabolic and immunity disorder       Relevant Orders   TSH      Meds ordered this encounter  Medications  . amLODipine (NORVASC) 10 MG tablet    Sig: Take 1 tablet (10 mg total) by mouth daily.    Dispense:  90 tablet    Refill:  1    Order Specific Question:   Supervising Provider    Answer:   Delia Chimes A [id number]  . atorvastatin (LIPITOR) 40 MG tablet    Sig: Take 1 tablet (40 mg total) by mouth daily.    Dispense:  90 tablet    Refill:  1    Order Specific Question:   Supervising Provider    Answer:   Forrest Moron [id number]    Follow-up: No follow-ups on file.   PLAN  Unremarkable physical exam  Meds refilled x 6 mo  Discussed smoking cessation briefly  Brief discussion regarding COVID-19 vaccine - she plans on getting the vaccine as soon as she is able  Patient encouraged to call clinic with any questions, comments, or concerns.  Maximiano Coss, NP

## 2019-09-24 LAB — CBC WITH DIFFERENTIAL/PLATELET
Basophils Absolute: 0.1 10*3/uL (ref 0.0–0.2)
Basos: 1 %
EOS (ABSOLUTE): 0.2 10*3/uL (ref 0.0–0.4)
Eos: 2 %
Hematocrit: 30.9 % — ABNORMAL LOW (ref 34.0–46.6)
Hemoglobin: 9.8 g/dL — ABNORMAL LOW (ref 11.1–15.9)
Immature Grans (Abs): 0.1 10*3/uL (ref 0.0–0.1)
Immature Granulocytes: 1 %
Lymphocytes Absolute: 1.7 10*3/uL (ref 0.7–3.1)
Lymphs: 15 %
MCH: 25.3 pg — ABNORMAL LOW (ref 26.6–33.0)
MCHC: 31.7 g/dL (ref 31.5–35.7)
MCV: 80 fL (ref 79–97)
Monocytes Absolute: 0.9 10*3/uL (ref 0.1–0.9)
Monocytes: 8 %
Neutrophils Absolute: 8.7 10*3/uL — ABNORMAL HIGH (ref 1.4–7.0)
Neutrophils: 73 %
Platelets: 571 10*3/uL — ABNORMAL HIGH (ref 150–450)
RBC: 3.87 x10E6/uL (ref 3.77–5.28)
RDW: 16.7 % — ABNORMAL HIGH (ref 11.7–15.4)
WBC: 11.6 10*3/uL — ABNORMAL HIGH (ref 3.4–10.8)

## 2019-09-24 LAB — LIPID PANEL
Chol/HDL Ratio: 2.5 ratio (ref 0.0–4.4)
Cholesterol, Total: 131 mg/dL (ref 100–199)
HDL: 52 mg/dL (ref >39)
LDL Chol Calc (NIH): 57 mg/dL (ref 0–99)
Triglycerides: 121 mg/dL (ref 0–149)
VLDL Cholesterol Cal: 22 mg/dL (ref 5–40)

## 2019-09-24 LAB — COMPREHENSIVE METABOLIC PANEL
ALT: 31 IU/L (ref 0–32)
AST: 23 IU/L (ref 0–40)
Albumin/Globulin Ratio: 1.4 (ref 1.2–2.2)
Albumin: 4.3 g/dL (ref 3.8–4.9)
Alkaline Phosphatase: 78 IU/L (ref 39–117)
BUN/Creatinine Ratio: 29 — ABNORMAL HIGH (ref 9–23)
BUN: 17 mg/dL (ref 6–24)
Bilirubin Total: <0.2 mg/dL (ref 0.0–1.2)
CO2: 22 mmol/L (ref 20–29)
Calcium: 9.2 mg/dL (ref 8.7–10.2)
Chloride: 98 mmol/L (ref 96–106)
Creatinine, Ser: 0.58 mg/dL (ref 0.57–1.00)
GFR calc Af Amer: 117 mL/min/{1.73_m2} (ref >59)
GFR calc non Af Amer: 102 mL/min/{1.73_m2} (ref >59)
Globulin, Total: 3 g/dL (ref 1.5–4.5)
Glucose: 185 mg/dL — ABNORMAL HIGH (ref 65–99)
Potassium: 3.6 mmol/L (ref 3.5–5.2)
Sodium: 139 mmol/L (ref 134–144)
Total Protein: 7.3 g/dL (ref 6.0–8.5)

## 2019-09-24 LAB — TSH: TSH: 0.567 u[IU]/mL (ref 0.450–4.500)

## 2019-09-24 LAB — HEMOGLOBIN A1C
Est. average glucose Bld gHb Est-mCnc: 157 mg/dL
Hgb A1c MFr Bld: 7.1 % — ABNORMAL HIGH (ref 4.8–5.6)

## 2019-09-25 ENCOUNTER — Telehealth: Payer: Self-pay | Admitting: Registered Nurse

## 2019-09-25 ENCOUNTER — Other Ambulatory Visit: Payer: Self-pay | Admitting: Registered Nurse

## 2019-09-25 DIAGNOSIS — E119 Type 2 diabetes mellitus without complications: Secondary | ICD-10-CM

## 2019-09-25 MED ORDER — METFORMIN HCL 500 MG PO TABS: 500.0000 mg | ORAL_TABLET | Freq: Two times a day (BID) | ORAL | 0 refills | Status: DC

## 2019-09-25 NOTE — Telephone Encounter (Signed)
Called patient to discuss results  CBC shows drop in RBC, hgb, hct and others - she is not experiencing any symptoms of anemia, no weight loss, no blood in stool, no shob, no coughing, no hemoptysis, no constipation. Will recheck these numbers to confirm - will consider cxr or CT based on smoking history - 22 pack years, does not qualify for routine screening per CDC guidelines  A1c shows diabetic range. Will start metformin 500mg  PO bid. Discussed diabetic friendly diet. Pt verbalizes understanding  Will plan to follow up in 3 mo  Kathrin Ruddy, NP

## 2019-09-26 NOTE — Telephone Encounter (Signed)
Called pt  LVM.

## 2019-12-09 DIAGNOSIS — I7300 Raynaud's syndrome without gangrene: Secondary | ICD-10-CM | POA: Diagnosis not present

## 2019-12-09 DIAGNOSIS — M1990 Unspecified osteoarthritis, unspecified site: Secondary | ICD-10-CM | POA: Diagnosis not present

## 2019-12-09 DIAGNOSIS — R21 Rash and other nonspecific skin eruption: Secondary | ICD-10-CM | POA: Diagnosis not present

## 2019-12-09 DIAGNOSIS — L299 Pruritus, unspecified: Secondary | ICD-10-CM | POA: Diagnosis not present

## 2019-12-13 ENCOUNTER — Telehealth: Payer: Self-pay | Admitting: Hematology and Oncology

## 2019-12-13 NOTE — Telephone Encounter (Signed)
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 Alyssa Rhodes has been cld and scheduled to see Dr. Alvy Bimler on 5/25 at 1:15pm. Pt aware to arrive 15 minutes early.

## 2019-12-17 ENCOUNTER — Inpatient Hospital Stay: Payer: BC Managed Care – PPO | Attending: Hematology and Oncology | Admitting: Hematology and Oncology

## 2019-12-17 ENCOUNTER — Other Ambulatory Visit: Payer: Self-pay

## 2019-12-17 ENCOUNTER — Encounter: Payer: Self-pay | Admitting: Hematology and Oncology

## 2019-12-17 ENCOUNTER — Inpatient Hospital Stay: Payer: BC Managed Care – PPO

## 2019-12-17 DIAGNOSIS — D539 Nutritional anemia, unspecified: Secondary | ICD-10-CM | POA: Insufficient documentation

## 2019-12-17 DIAGNOSIS — M329 Systemic lupus erythematosus, unspecified: Secondary | ICD-10-CM | POA: Diagnosis not present

## 2019-12-17 DIAGNOSIS — I10 Essential (primary) hypertension: Secondary | ICD-10-CM

## 2019-12-17 DIAGNOSIS — D509 Iron deficiency anemia, unspecified: Secondary | ICD-10-CM

## 2019-12-17 DIAGNOSIS — F17210 Nicotine dependence, cigarettes, uncomplicated: Secondary | ICD-10-CM | POA: Diagnosis not present

## 2019-12-17 DIAGNOSIS — E119 Type 2 diabetes mellitus without complications: Secondary | ICD-10-CM | POA: Insufficient documentation

## 2019-12-17 DIAGNOSIS — Z720 Tobacco use: Secondary | ICD-10-CM

## 2019-12-17 DIAGNOSIS — R5383 Other fatigue: Secondary | ICD-10-CM | POA: Insufficient documentation

## 2019-12-17 LAB — COMPREHENSIVE METABOLIC PANEL
ALT: 27 U/L (ref 0–44)
AST: 21 U/L (ref 15–41)
Albumin: 4.2 g/dL (ref 3.5–5.0)
Alkaline Phosphatase: 58 U/L (ref 38–126)
Anion gap: 10 (ref 5–15)
BUN: 13 mg/dL (ref 6–20)
CO2: 27 mmol/L (ref 22–32)
Calcium: 9.4 mg/dL (ref 8.9–10.3)
Chloride: 102 mmol/L (ref 98–111)
Creatinine, Ser: 0.64 mg/dL (ref 0.44–1.00)
GFR calc Af Amer: >60 mL/min (ref >60)
GFR calc non Af Amer: >60 mL/min (ref >60)
Glucose, Bld: 121 mg/dL — ABNORMAL HIGH (ref 70–99)
Potassium: 3.6 mmol/L (ref 3.5–5.1)
Sodium: 139 mmol/L (ref 135–145)
Total Bilirubin: 0.3 mg/dL (ref 0.3–1.2)
Total Protein: 7.8 g/dL (ref 6.5–8.1)

## 2019-12-17 LAB — CBC WITH DIFFERENTIAL/PLATELET
Abs Immature Granulocytes: 0.03 10*3/uL (ref 0.00–0.07)
Basophils Absolute: 0.1 10*3/uL (ref 0.0–0.1)
Basophils Relative: 1 %
Eosinophils Absolute: 0.3 10*3/uL (ref 0.0–0.5)
Eosinophils Relative: 3 %
HCT: 29.6 % — ABNORMAL LOW (ref 36.0–46.0)
Hemoglobin: 8.6 g/dL — ABNORMAL LOW (ref 12.0–15.0)
Immature Granulocytes: 0 %
Lymphocytes Relative: 20 %
Lymphs Abs: 1.9 10*3/uL (ref 0.7–4.0)
MCH: 22.2 pg — ABNORMAL LOW (ref 26.0–34.0)
MCHC: 29.1 g/dL — ABNORMAL LOW (ref 30.0–36.0)
MCV: 76.5 fL — ABNORMAL LOW (ref 80.0–100.0)
Monocytes Absolute: 0.9 10*3/uL (ref 0.1–1.0)
Monocytes Relative: 9 %
Neutro Abs: 6.2 10*3/uL (ref 1.7–7.7)
Neutrophils Relative %: 67 %
Platelets: 498 10*3/uL — ABNORMAL HIGH (ref 150–400)
RBC: 3.87 MIL/uL (ref 3.87–5.11)
RDW: 24 % — ABNORMAL HIGH (ref 11.5–15.5)
WBC: 9.3 10*3/uL (ref 4.0–10.5)
nRBC: 0 % (ref 0.0–0.2)

## 2019-12-17 LAB — SEDIMENTATION RATE: Sed Rate: 8 mm/hr (ref 0–22)

## 2019-12-17 LAB — VITAMIN B12: Vitamin B-12: 729 pg/mL (ref 180–914)

## 2019-12-17 NOTE — Assessment & Plan Note (Signed)
The cause of the microcytic anemia is multifactorial She is taking chronic methotrexate medications but that in general cause macrocytosis I suspect it could be a component of iron deficiency I will check vitamin B12, iron studies and others for further evaluation I will also check to see if the anemia could be caused by active lupus or not I will call her with test results If test results are unrevealing, she might need repeat GI evaluation to rule out malignancy, given her extensive history of smoking and drinking

## 2019-12-17 NOTE — Assessment & Plan Note (Signed)
We discussed the importance of smoking cessation

## 2019-12-17 NOTE — Progress Notes (Signed)
[facility] CONSULT NOTE  Patient Care Team: Maximiano Coss, NP as PCP - General (Adult Health Nurse Practitioner)  CHIEF COMPLAINTS/PURPOSE OF CONSULTATION:  Severe anemia  HISTORY OF PRESENTING ILLNESS:  Alyssa Rhodes 59 y.o. female is here because of recent findings of anemia The patient was diagnosed with lupus and has been on medications for at least 6 months with methotrexate as well as folic acid supplementation She was following up with rheumatologist and was noted to have microcytic anemia  I have the opportunity to review her blood counts in the electronic records At baseline, on Nov 24, 2016, white blood cell count was 5.8, hemoglobin 13.9 and platelet count of 391,000 On September 23, 2019, white blood cell count was 11.6, hemoglobin 9.8 and platelet count of 571,000 In the rheumatologist office, on 12/09/2019, white blood cell count was 8.9, hemoglobin 9.2, MCV of 75 and platelet count of 482,000 She denies recent chest pain on exertion, shortness of breath on minimal exertion, pre-syncopal episodes, or palpitations. She had not noticed any recent bleeding such as epistaxis, hematuria or hematochezia The patient denies over the counter NSAID ingestion. She is on antiplatelets agents. Her last colonoscopy was many years ago and she was told it was within normal limits She had no prior history or diagnosis of cancer. Her age appropriate screening programs are up-to-date. She denies any pica and eats a variety of diet. She never donated blood or received blood transfusion  She has significant smoking history, more than 40-pack-year of smoking She also drink hard liquor on a regular basis but cannot quantify exactly how many days per week and how much drinks each time  MEDICAL HISTORY:  Past Medical History:  Diagnosis Date  . Diabetes mellitus without complication ([place])   . Hyperlipidemia   . Hypertension   . Lupus ([place])   . Thyroid nodule 03/2010   aspiration     SURGICAL HISTORY: Past Surgical History:  Procedure Laterality Date  . ABDOMINAL HYSTERECTOMY    . CESAREAN SECTION    . SHOULDER SURGERY  2011   LEFT    SOCIAL HISTORY: Social History   Socioeconomic History  . Marital status: Widowed    Spouse name: Not on file  . Number of children: 2  . Years of education: Not on file  . Highest education level: Not on file  Occupational History  . Occupation: Optometrist  Tobacco Use  . Smoking status: Current Every Day Smoker    Packs/day: 1.00    Years: 41.00    Pack years: 41.00    Types: Cigarettes  . Smokeless tobacco: Never Used  Substance and Sexual Activity  . Alcohol use: Yes    Alcohol/week: 3.0 standard drinks    Types: 3 Standard drinks or equivalent per week    Comment: 3/week  . Drug use: No  . Sexual activity: Not Currently  Other Topics Concern  . Not on file  Social History Narrative  . Not on file   Social Determinants of Health   Financial Resource Strain:   . Difficulty of Paying Living Expenses:   Food Insecurity:   . Worried About Charity fundraiser in the Last Year:   . Arboriculturist in the Last Year:   Transportation Needs:   . Film/video editor (Medical):   Marland Kitchen Lack of Transportation (Non-Medical):   Physical Activity:   . Days of Exercise per Week:   . Minutes of Exercise per Session:   Stress:   .  Feeling of Stress :   Social Connections:   . Frequency of Communication with Friends and Family:   . Frequency of Social Gatherings with Friends and Family:   . Attends Religious Services:   . Active Member of Clubs or Organizations:   . Attends Archivist Meetings:   Marland Kitchen Marital Status:   Intimate Partner Violence:   . Fear of Current or Ex-Partner:   . Emotionally Abused:   Marland Kitchen Physically Abused:   . Sexually Abused:     FAMILY HISTORY: Family History  Problem Relation Age of Onset  . Asthma Mother        doe not know mother well, sister advised her  . Diabetes  Mother   . Heart disease Father        heart attack  . Hypertension Father     ALLERGIES:  is allergic to codeine.  MEDICATIONS:  Current Outpatient Medications  Medication Sig Dispense Refill  . amLODipine (NORVASC) 10 MG tablet Take 1 tablet (10 mg total) by mouth daily. 90 tablet 1  . aspirin EC 81 MG tablet Take 81 mg by mouth daily.    Marland Kitchen atorvastatin (LIPITOR) 40 MG tablet Take 1 tablet (40 mg total) by mouth daily. 90 tablet 1  . folic acid (FOLVITE) 1 MG tablet TAKE 1 TABLET BY MOUTH ONCE DAILY FOR 30 DAYS    . metFORMIN (GLUCOPHAGE) 500 MG tablet Take 1 tablet (500 mg total) by mouth 2 (two) times daily with a meal. 180 tablet 0  . methotrexate 2.5 MG tablet TAKE 4 TABLETS BY MOUTH ONCE A WEEK    . Multiple Vitamin (MULTIVITAMIN) tablet Take 1 tablet by mouth daily.    . Omega-3 Fatty Acids (FISH OIL PO) Take by mouth daily.     No current facility-administered medications for this visit.    REVIEW OF SYSTEMS:   Constitutional: Denies fevers, chills or abnormal night sweats Eyes: Denies blurriness of vision, double vision or watery eyes Ears, nose, mouth, throat, and face: Denies mucositis or sore throat Respiratory: Denies cough, dyspnea or wheezes Cardiovascular: Denies palpitation, chest discomfort or lower extremity swelling Gastrointestinal:  Denies nausea, heartburn or change in bowel habits Skin: Denies abnormal skin rashes Lymphatics: Denies new lymphadenopathy or easy bruising Neurological:Denies numbness, tingling or new weaknesses Behavioral/Psych: Mood is stable, no new changes  All other systems were reviewed with the patient and are negative.  PHYSICAL EXAMINATION: ECOG PERFORMANCE STATUS: 1 - Symptomatic but completely ambulatory  Vitals:   12/17/19 1313  BP: (!) 152/76  Pulse: (!) 105  Resp: 18  Temp: 99.4 F (37.4 C)  SpO2: 100%   Filed Weights   12/17/19 1313  Weight: 113 lb (51.3 kg)    GENERAL:alert, no distress and comfortable SKIN:  skin color, texture, turgor are normal, no rashes or significant lesions EYES: normal, conjunctiva are pink and non-injected, sclera clear OROPHARYNX:no exudate, no erythema and lips, buccal mucosa, and tongue normal  NECK: supple, thyroid normal size, non-tender, without nodularity LYMPH:  no palpable lymphadenopathy in the cervical, axillary or inguinal LUNGS: clear to auscultation and percussion with normal breathing effort HEART: regular rate & rhythm and no murmurs and no lower extremity edema ABDOMEN:abdomen soft, non-tender and normal bowel sounds Musculoskeletal:no cyanosis of digits and no clubbing  PSYCH: alert & oriented x 3 with fluent speech NEURO: no focal motor/sensory deficits Lab Results  Component Value Date   WBC 9.3 12/17/2019   HGB 8.6 (L) 12/17/2019   HCT 29.6 (L)  12/17/2019   MCV 76.5 (L) 12/17/2019   PLT 498 (H) 12/17/2019     ASSESSMENT & PLAN:  Deficiency anemia The cause of the microcytic anemia is multifactorial She is taking chronic methotrexate medications but that in general cause macrocytosis I suspect it could be a component of iron deficiency I will check vitamin B12, iron studies and others for further evaluation I will also check to see if the anemia could be caused by active lupus or not I will call her with test results If test results are unrevealing, she might need repeat GI evaluation to rule out malignancy, given her extensive history of smoking and drinking  Other fatigue This is likely caused by anemia and her medications I will check thyroid function test  Tobacco user We discussed the importance of smoking cessation  Orders Placed This Encounter  Procedures  . CBC with Differential/Platelet    Standing Status:   Future    Number of Occurrences:   1    Standing Expiration Date:   12/16/2020  . Comprehensive metabolic panel    Standing Status:   Future    Number of Occurrences:   1    Standing Expiration Date:   12/16/2020  .  Lactate dehydrogenase    Standing Status:   Future    Number of Occurrences:   1    Standing Expiration Date:   12/16/2020  . Vitamin B12    Standing Status:   Future    Number of Occurrences:   1    Standing Expiration Date:   12/16/2020  . Sedimentation rate    Standing Status:   Future    Number of Occurrences:   1    Standing Expiration Date:   12/16/2020  . Iron and TIBC    Standing Status:   Future    Number of Occurrences:   1    Standing Expiration Date:   12/16/2020  . Ferritin    Standing Status:   Future    Number of Occurrences:   1    Standing Expiration Date:   12/16/2020  . TSH    Standing Status:   Future    Number of Occurrences:   1    Standing Expiration Date:   12/16/2020    All questions were answered. The patient knows to call the clinic with any problems, questions or concerns.  The total time spent in the appointment was 55 minutes encounter with patients including review of chart and various tests results, discussions about plan of care and coordination of care plan  Heath Lark, MD 5/25/20212:30 PM       Heath Lark, MD 12/17/19 2:30 PM

## 2019-12-17 NOTE — Assessment & Plan Note (Signed)
This is likely caused by anemia and her medications I will check thyroid function test

## 2019-12-18 ENCOUNTER — Telehealth: Payer: Self-pay | Admitting: *Deleted

## 2019-12-18 ENCOUNTER — Other Ambulatory Visit: Payer: Self-pay | Admitting: Hematology and Oncology

## 2019-12-18 DIAGNOSIS — D509 Iron deficiency anemia, unspecified: Secondary | ICD-10-CM | POA: Insufficient documentation

## 2019-12-18 DIAGNOSIS — D508 Other iron deficiency anemias: Secondary | ICD-10-CM

## 2019-12-18 LAB — LACTATE DEHYDROGENASE: LDH: 136 U/L (ref 98–192)

## 2019-12-18 LAB — IRON AND TIBC
Iron: 14 ug/dL — ABNORMAL LOW (ref 41–142)
Saturation Ratios: 3 % — ABNORMAL LOW (ref 21–57)
TIBC: 488 ug/dL — ABNORMAL HIGH (ref 236–444)
UIBC: 473 ug/dL — ABNORMAL HIGH (ref 120–384)

## 2019-12-18 LAB — FERRITIN: Ferritin: 7 ng/mL — ABNORMAL LOW (ref 11–307)

## 2019-12-18 LAB — TSH: TSH: 0.524 u[IU]/mL (ref 0.308–3.960)

## 2019-12-18 NOTE — Telephone Encounter (Signed)
I sent schediling msg for IV iron and return visit

## 2019-12-18 NOTE — Telephone Encounter (Signed)
Notified of message below.States she is OK taking the IV iron.

## 2019-12-18 NOTE — Telephone Encounter (Signed)
-----   Message from Heath Lark, MD sent at 12/18/2019 10:26 AM EDT ----- Regarding: test resulkts Pls call her and let her know she has severe iron def anemia I recommend IV iron If she agrees, let me know and I will send scheduling msg She needs weekly IV iron x 2 and then see me back a month after for repeat testing

## 2019-12-19 ENCOUNTER — Telehealth: Payer: Self-pay | Admitting: Hematology and Oncology

## 2019-12-19 NOTE — Telephone Encounter (Signed)
Scheduled per 5/26 sch message. Pt aware of appts.

## 2019-12-23 ENCOUNTER — Other Ambulatory Visit: Payer: Self-pay | Admitting: Registered Nurse

## 2019-12-23 DIAGNOSIS — Z9189 Other specified personal risk factors, not elsewhere classified: Secondary | ICD-10-CM

## 2019-12-24 ENCOUNTER — Other Ambulatory Visit: Payer: Self-pay

## 2019-12-24 ENCOUNTER — Encounter: Payer: Self-pay | Admitting: Registered Nurse

## 2019-12-24 ENCOUNTER — Ambulatory Visit: Payer: BC Managed Care – PPO | Admitting: Registered Nurse

## 2019-12-24 VITALS — BP 151/82 | HR 92 | Temp 97.7°F | Ht 62.0 in | Wt 111.8 lb

## 2019-12-24 DIAGNOSIS — Z1159 Encounter for screening for other viral diseases: Secondary | ICD-10-CM | POA: Diagnosis not present

## 2019-12-24 DIAGNOSIS — E119 Type 2 diabetes mellitus without complications: Secondary | ICD-10-CM

## 2019-12-24 DIAGNOSIS — Z1231 Encounter for screening mammogram for malignant neoplasm of breast: Secondary | ICD-10-CM

## 2019-12-24 LAB — POCT GLYCOSYLATED HEMOGLOBIN (HGB A1C): Hemoglobin A1C: 6.2 % — AB (ref 4.0–5.6)

## 2019-12-24 NOTE — Patient Instructions (Signed)
° ° ° °  If you have lab work done today you will be contacted with your lab results within the next 2 weeks.  If you have not heard from us then please contact us. The fastest way to get your results is to register for My Chart. ° ° °IF you received an x-ray today, you will receive an invoice from [facility]. Please contact [facility] at [phone] with questions or concerns regarding your invoice.  ° °IF you received labwork today, you will receive an invoice from [facility]. Please contact [facility] at [phone] with questions or concerns regarding your invoice.  ° °Our billing staff will not be able to assist you with questions regarding bills from these companies. ° °You will be contacted with the lab results as soon as they are available. The fastest way to get your results is to activate your My Chart account. Instructions are located on the last page of this paperwork. If you have not heard from us regarding the results in 2 weeks, please contact this office. °  ° ° ° °

## 2019-12-24 NOTE — Addendum Note (Signed)
Addended by: Meredeth Ide on: 12/24/2019 10:02 AM   Modules accepted: Orders

## 2019-12-24 NOTE — Progress Notes (Signed)
Established Patient Office Visit  Subjective:  Patient ID: Alyssa Rhodes, female    DOB: [birth year]  Age: 59 y.o. MRN: [id number]  CC:  Chief Complaint  Patient presents with   Follow-up    x65mos    HPI [facility] presents for 18mo follow up on t2dm. Newly dx. A1c in March at 7.1. Taking metformin 500mg  PO bid - notes GI upset, but has taken regardless.  States she has been losing some weight d/t poor PO intake. This is due to poor dentition - something she is working on at this time. Encouraged boost and ensure shakes if unable to tolerate solid foods.   BP mildly elevated today. Reports good med compliance. Denies CV symptoms. Reports much stress lately. Does not want to change her current regimen. Smoked a cigarette before arriving to appt today.   No further concerns at this time.   Past Medical History:  Diagnosis Date   Diabetes mellitus without complication ([place])    Hyperlipidemia    Hypertension    Lupus ([place])    Thyroid nodule 03/2010   aspiration    Past Surgical History:  Procedure Laterality Date   ABDOMINAL HYSTERECTOMY     CESAREAN SECTION     SHOULDER SURGERY  2011   LEFT    Family History  Problem Relation Age of Onset   Asthma Mother        doe not know mother well, sister advised her   Diabetes Mother    Heart disease Father        heart attack   Hypertension Father     Social History   Socioeconomic History   Marital status: Widowed    Spouse name: Not on file   Number of children: 2   Years of education: Not on file   Highest education level: Not on file  Occupational History   Occupation: accountant  Tobacco Use   Smoking status: Current Every Day Smoker    Packs/day: 1.00    Years: 41.00    Pack years: 41.00    Types: Cigarettes   Smokeless tobacco: Never Used  Substance and Sexual Activity   Alcohol use: Yes    Alcohol/week: 3.0 standard drinks    Types: 3 Standard drinks or equivalent per week   Comment: 3/week   Drug use: No   Sexual activity: Not Currently  Other Topics Concern   Not on file  Social History Narrative   Not on file   Social Determinants of Health   Financial Resource Strain:    Difficulty of Paying Living Expenses:   Food Insecurity:    Worried About Charity fundraiser in the Last Year:    Arboriculturist in the Last Year:   Transportation Needs:    Film/video editor (Medical):    Lack of Transportation (Non-Medical):   Physical Activity:    Days of Exercise per Week:    Minutes of Exercise per Session:   Stress:    Feeling of Stress :   Social Connections:    Frequency of Communication with Friends and Family:    Frequency of Social Gatherings with Friends and Family:    Attends Religious Services:    Active Member of Clubs or Organizations:    Attends Archivist Meetings:    Marital Status:   Intimate Partner Violence:    Fear of Current or Ex-Partner:    Emotionally Abused:    Physically Abused:  Sexually Abused:     Outpatient Medications Prior to Visit  Medication Sig Dispense Refill   amLODipine (NORVASC) 10 MG tablet Take 1 tablet (10 mg total) by mouth daily. 90 tablet 1   aspirin EC 81 MG tablet Take 81 mg by mouth daily.     atorvastatin (LIPITOR) 40 MG tablet Take 1 tablet (40 mg total) by mouth daily. 90 tablet 1   folic acid (FOLVITE) 1 MG tablet TAKE 1 TABLET BY MOUTH ONCE DAILY FOR 30 DAYS     metFORMIN (GLUCOPHAGE) 500 MG tablet Take 1 tablet (500 mg total) by mouth 2 (two) times daily with a meal. 180 tablet 0   methotrexate 2.5 MG tablet TAKE 4 TABLETS BY MOUTH ONCE A WEEK     Multiple Vitamin (MULTIVITAMIN) tablet Take 1 tablet by mouth daily.     Omega-3 Fatty Acids (FISH OIL PO) Take by mouth daily.     No facility-administered medications prior to visit.    Allergies  Allergen Reactions   Codeine Nausea And Vomiting    ROS Review of Systems  Constitutional:  Negative.   HENT: Negative.   Eyes: Negative.   Respiratory: Negative.   Cardiovascular: Negative.   Gastrointestinal: Negative.   Endocrine: Negative.   Genitourinary: Negative.   Musculoskeletal: Negative.   Skin: Negative.   Allergic/Immunologic: Negative.   Neurological: Negative.   Hematological: Negative.   Psychiatric/Behavioral: Negative.   All other systems reviewed and are negative.     Objective:    Physical Exam  Constitutional: She is oriented to person, place, and time. She appears well-developed and well-nourished. No distress.  Cardiovascular: Normal rate and regular rhythm.  Pulmonary/Chest: Effort normal. No respiratory distress.  Neurological: She is alert and oriented to person, place, and time.  Skin: Skin is warm and dry. No rash noted. She is not diaphoretic. No erythema. No pallor.  Psychiatric: She has a normal mood and affect. Her behavior is normal. Judgment and thought content normal.  Nursing note and vitals reviewed.   BP (!) 152/77 (BP Location: Left Arm, Patient Position: Sitting, Cuff Size: Normal)    Pulse 92    Temp 97.7 F (36.5 C) (Temporal)    Ht 5\' 2"  (1.575 m)    Wt 111 lb 12.8 oz (50.7 kg)    SpO2 99%    BMI 20.45 kg/m  Wt Readings from Last 3 Encounters:  12/24/19 111 lb 12.8 oz (50.7 kg)  12/17/19 113 lb (51.3 kg)  09/23/19 119 lb 3.2 oz (54.1 kg)     Health Maintenance Due  Topic Date Due   Hepatitis C Screening  Never done   URINE MICROALBUMIN  Never done   COVID-19 Vaccine (1) Never done   PAP SMEAR-Modifier  Never done   MAMMOGRAM  Never done    There are no preventive care reminders to display for this patient.  Lab Results  Component Value Date   TSH 0.524 12/17/2019   Lab Results  Component Value Date   WBC 9.3 12/17/2019   HGB 8.6 (L) 12/17/2019   HCT 29.6 (L) 12/17/2019   MCV 76.5 (L) 12/17/2019   PLT 498 (H) 12/17/2019   Lab Results  Component Value Date   NA 139 12/17/2019   K 3.6 12/17/2019     CO2 27 12/17/2019   GLUCOSE 121 (H) 12/17/2019   BUN 13 12/17/2019   CREATININE 0.64 12/17/2019   BILITOT 0.3 12/17/2019   ALKPHOS 58 12/17/2019   AST 21 12/17/2019  ALT 27 12/17/2019   PROT 7.8 12/17/2019   ALBUMIN 4.2 12/17/2019   CALCIUM 9.4 12/17/2019   ANIONGAP 10 12/17/2019   Lab Results  Component Value Date   CHOL 131 09/23/2019   Lab Results  Component Value Date   HDL 52 09/23/2019   Lab Results  Component Value Date   LDLCALC 57 09/23/2019   Lab Results  Component Value Date   TRIG 121 09/23/2019   Lab Results  Component Value Date   CHOLHDL 2.5 09/23/2019   Lab Results  Component Value Date   HGBA1C 7.1 (H) 09/23/2019      Assessment & Plan:   Problem List Items Addressed This Visit    None    Visit Diagnoses    Screening mammogram, encounter for    -  Primary   Relevant Orders   MM Digital Screening   Need for hepatitis C screening test       Relevant Orders   Hepatitis C antibody   Type 2 diabetes mellitus without complication, without long-term current use of insulin (HCC)       Relevant Orders   Microalbumin, urine   POCT glycosylated hemoglobin (Hb A1C)      No orders of the defined types were placed in this encounter.   Follow-up: No follow-ups on file.   PLAN  a1c improved well to 6.2. We can stop metformin and pursue diet and lifestyle control for T2dM.  Return in 6 mo for med check  Patient encouraged to call clinic with any questions, comments, or concerns.  Maximiano Coss, NP

## 2019-12-25 LAB — HEPATITIS C ANTIBODY: Hep C Virus Ab: <0.1 s/co ratio (ref 0.0–0.9)

## 2019-12-30 ENCOUNTER — Other Ambulatory Visit: Payer: Self-pay

## 2019-12-30 ENCOUNTER — Inpatient Hospital Stay: Payer: BC Managed Care – PPO | Attending: Hematology and Oncology

## 2019-12-30 VITALS — BP 156/75 | HR 84 | Temp 99.0°F | Resp 17

## 2019-12-30 DIAGNOSIS — D509 Iron deficiency anemia, unspecified: Secondary | ICD-10-CM | POA: Insufficient documentation

## 2019-12-30 DIAGNOSIS — D508 Other iron deficiency anemias: Secondary | ICD-10-CM

## 2019-12-30 MED ORDER — SODIUM CHLORIDE 0.9 % IV SOLN
510.0000 mg | Freq: Once | INTRAVENOUS | Status: AC
  Administered 2019-12-30: 510 mg via INTRAVENOUS
  Filled 2019-12-30: qty 510

## 2019-12-30 MED ORDER — SODIUM CHLORIDE 0.9 % IV SOLN
Freq: Once | INTRAVENOUS | Status: AC
  Filled 2019-12-30: qty 250

## 2019-12-30 NOTE — Patient Instructions (Signed)

## 2020-01-06 ENCOUNTER — Other Ambulatory Visit: Payer: Self-pay

## 2020-01-06 ENCOUNTER — Inpatient Hospital Stay: Payer: BC Managed Care – PPO

## 2020-01-06 VITALS — BP 140/72 | HR 78 | Temp 99.3°F | Resp 17

## 2020-01-06 DIAGNOSIS — D509 Iron deficiency anemia, unspecified: Secondary | ICD-10-CM | POA: Diagnosis not present

## 2020-01-06 DIAGNOSIS — D508 Other iron deficiency anemias: Secondary | ICD-10-CM

## 2020-01-06 MED ORDER — SODIUM CHLORIDE 0.9 % IV SOLN
510.0000 mg | Freq: Once | INTRAVENOUS | Status: AC
  Administered 2020-01-06: 510 mg via INTRAVENOUS
  Filled 2020-01-06: qty 510

## 2020-01-06 MED ORDER — SODIUM CHLORIDE 0.9 % IV SOLN
Freq: Once | INTRAVENOUS | Status: AC
  Filled 2020-01-06: qty 250

## 2020-01-06 NOTE — Patient Instructions (Signed)

## 2020-02-17 ENCOUNTER — Inpatient Hospital Stay: Payer: BC Managed Care – PPO | Attending: Hematology and Oncology | Admitting: Hematology and Oncology

## 2020-02-17 ENCOUNTER — Other Ambulatory Visit: Payer: Self-pay

## 2020-02-17 ENCOUNTER — Inpatient Hospital Stay: Payer: BC Managed Care – PPO

## 2020-02-17 ENCOUNTER — Encounter: Payer: Self-pay | Admitting: Hematology and Oncology

## 2020-02-17 ENCOUNTER — Telehealth: Payer: Self-pay | Admitting: Hematology and Oncology

## 2020-02-17 DIAGNOSIS — F17210 Nicotine dependence, cigarettes, uncomplicated: Secondary | ICD-10-CM | POA: Insufficient documentation

## 2020-02-17 DIAGNOSIS — Z7982 Long term (current) use of aspirin: Secondary | ICD-10-CM | POA: Diagnosis not present

## 2020-02-17 DIAGNOSIS — D509 Iron deficiency anemia, unspecified: Secondary | ICD-10-CM | POA: Insufficient documentation

## 2020-02-17 DIAGNOSIS — D539 Nutritional anemia, unspecified: Secondary | ICD-10-CM

## 2020-02-17 DIAGNOSIS — D508 Other iron deficiency anemias: Secondary | ICD-10-CM

## 2020-02-17 DIAGNOSIS — Z79899 Other long term (current) drug therapy: Secondary | ICD-10-CM | POA: Diagnosis not present

## 2020-02-17 DIAGNOSIS — Z720 Tobacco use: Secondary | ICD-10-CM

## 2020-02-17 LAB — FERRITIN: Ferritin: 59 ng/mL (ref 11–307)

## 2020-02-17 LAB — IRON AND TIBC
Iron: 111 ug/dL (ref 41–142)
Saturation Ratios: 25 % (ref 21–57)
TIBC: 439 ug/dL (ref 236–444)
UIBC: 328 ug/dL (ref 120–384)

## 2020-02-17 LAB — CBC WITH DIFFERENTIAL/PLATELET
Abs Immature Granulocytes: 0.05 10*3/uL (ref 0.00–0.07)
Basophils Absolute: 0.1 10*3/uL (ref 0.0–0.1)
Basophils Relative: 1 %
Eosinophils Absolute: 0.2 10*3/uL (ref 0.0–0.5)
Eosinophils Relative: 2 %
HCT: 48.2 % — ABNORMAL HIGH (ref 36.0–46.0)
Hemoglobin: 16.3 g/dL — ABNORMAL HIGH (ref 12.0–15.0)
Immature Granulocytes: 1 %
Lymphocytes Relative: 18 %
Lymphs Abs: 1.3 10*3/uL (ref 0.7–4.0)
MCH: 31.4 pg (ref 26.0–34.0)
MCHC: 33.8 g/dL (ref 30.0–36.0)
MCV: 92.9 fL (ref 80.0–100.0)
Monocytes Absolute: 0.7 10*3/uL (ref 0.1–1.0)
Monocytes Relative: 9 %
Neutro Abs: 5.1 10*3/uL (ref 1.7–7.7)
Neutrophils Relative %: 69 %
Platelets: 382 10*3/uL (ref 150–400)
RBC: 5.19 MIL/uL — ABNORMAL HIGH (ref 3.87–5.11)
WBC: 7.3 10*3/uL (ref 4.0–10.5)
nRBC: 0 % (ref 0.0–0.2)

## 2020-02-17 NOTE — Assessment & Plan Note (Signed)
After receiving 2 doses of intravenous iron replacement therapy, her hemoglobin corrected to normal In fact, she has mild erythrocytosis, likely secondary to smoking She has frequent follow-up and monitoring with her rheumatologist For now, I plan to see her back once a year She is in agreement

## 2020-02-17 NOTE — Assessment & Plan Note (Signed)
We discussed the importance of nicotine cessation I warned her about risk of blood clot in the setting of secondary erythrocytosis She states she will try to quit on her own

## 2020-02-17 NOTE — Progress Notes (Signed)
[facility] OFFICE PROGRESS NOTE  Maximiano Coss, NP  ASSESSMENT & PLAN:  Deficiency anemia After receiving 2 doses of intravenous iron replacement therapy, her hemoglobin corrected to normal In fact, she has mild erythrocytosis, likely secondary to smoking She has frequent follow-up and monitoring with her rheumatologist For now, I plan to see her back once a year She is in agreement  Tobacco user We discussed the importance of nicotine cessation I warned her about risk of blood clot in the setting of secondary erythrocytosis She states she will try to quit on her own   No orders of the defined types were placed in this encounter.   The total time spent in the appointment was 10 minutes encounter with patients including review of chart and various tests results, discussions about plan of care and coordination of care plan   All questions were answered. The patient knows to call the clinic with any problems, questions or concerns. No barriers to learning was detected.    Heath Lark, MD 7/26/20218:57 AM  INTERVAL HISTORY: Alyssa Rhodes 59 y.o. female returns for further follow-up on iron deficiency anemia She received 2 doses of intravenous iron recently She feels better The patient denies any recent signs or symptoms of bleeding such as spontaneous epistaxis, hematuria or hematochezia.    SUMMARY OF HEMATOLOGIC HISTORY: Alyssa Rhodes is seen here because of recent findings of anemia The patient was diagnosed with lupus and has been on medications for at least 6 months with methotrexate as well as folic acid supplementation She was following up with rheumatologist and was noted to have microcytic anemia  I have the opportunity to review her blood counts in the electronic records At baseline, on Nov 24, 2016, white blood cell count was 5.8, hemoglobin 13.9 and platelet count of 391,000 On September 23, 2019, white blood cell count was 11.6, hemoglobin 9.8 and  platelet count of 571,000 In the rheumatologist office, on 12/09/2019, white blood cell count was 8.9, hemoglobin 9.2, MCV of 75 and platelet count of 482,000 She denies recent chest pain on exertion, shortness of breath on minimal exertion, pre-syncopal episodes, or palpitations. She had not noticed any recent bleeding such as epistaxis, hematuria or hematochezia The patient denies over the counter NSAID ingestion. She is on antiplatelets agents. Her last colonoscopy was many years ago and she was told it was within normal limits She had no prior history or diagnosis of cancer. Her age appropriate screening programs are up-to-date. She denies any pica and eats a variety of diet. She never donated blood or received blood transfusion  She has significant smoking history, more than 40-pack-year of smoking She also drink hard liquor on a regular basis but cannot quantify exactly how many days per week and how much drinks each time In May to June, she received 2 doses of intravenous iron for severe iron deficiency anemia  I have reviewed the past medical history, past surgical history, social history and family history with the patient and they are unchanged from previous note.  ALLERGIES:  is allergic to codeine.  MEDICATIONS:  Current Outpatient Medications  Medication Sig Dispense Refill  . amLODipine (NORVASC) 10 MG tablet Take 1 tablet (10 mg total) by mouth daily. 90 tablet 1  . aspirin EC 81 MG tablet Take 81 mg by mouth daily.    . folic acid (FOLVITE) 1 MG tablet TAKE 1 TABLET BY MOUTH ONCE DAILY FOR 30 DAYS    . methotrexate 2.5 MG  tablet TAKE 4 TABLETS BY MOUTH ONCE A WEEK    . Multiple Vitamin (MULTIVITAMIN) tablet Take 1 tablet by mouth daily.    . Omega-3 Fatty Acids (FISH OIL PO) Take by mouth daily.     No current facility-administered medications for this visit.     REVIEW OF SYSTEMS:   Constitutional: Denies fevers, chills or night sweats Eyes: Denies blurriness of  vision Ears, nose, mouth, throat, and face: Denies mucositis or sore throat Respiratory: Denies cough, dyspnea or wheezes Cardiovascular: Denies palpitation, chest discomfort or lower extremity swelling Gastrointestinal:  Denies nausea, heartburn or change in bowel habits Skin: Denies abnormal skin rashes Lymphatics: Denies new lymphadenopathy or easy bruising Neurological:Denies numbness, tingling or new weaknesses Behavioral/Psych: Mood is stable, no new changes  All other systems were reviewed with the patient and are negative.  PHYSICAL EXAMINATION: ECOG PERFORMANCE STATUS: 0 - Asymptomatic  Vitals:   02/17/20 0804  BP: (!) 165/86  Pulse: 88  Resp: 17  Temp: 98.5 F (36.9 C)  SpO2: 99%   Filed Weights   02/17/20 0804  Weight: 113 lb 8 oz (51.5 kg)    GENERAL:alert, no distress and comfortable NEURO: alert & oriented x 3 with fluent speech, no focal motor/sensory deficits  LABORATORY DATA:  I have reviewed the data as listed     Component Value Date/Time   NA 139 12/17/2019 1336   NA 139 09/23/2019 1002   K 3.6 12/17/2019 1336   CL 102 12/17/2019 1336   CO2 27 12/17/2019 1336   GLUCOSE 121 (H) 12/17/2019 1336   BUN 13 12/17/2019 1336   BUN 17 09/23/2019 1002   CREATININE 0.64 12/17/2019 1336   CREATININE 0.87 04/28/2016 1604   CALCIUM 9.4 12/17/2019 1336   PROT 7.8 12/17/2019 1336   PROT 7.3 09/23/2019 1002   ALBUMIN 4.2 12/17/2019 1336   ALBUMIN 4.3 09/23/2019 1002   AST 21 12/17/2019 1336   ALT 27 12/17/2019 1336   ALKPHOS 58 12/17/2019 1336   BILITOT 0.3 12/17/2019 1336   BILITOT <0.2 09/23/2019 1002   GFRNONAA >60 12/17/2019 1336   GFRNONAA >89 08/11/2015 1343   GFRAA >60 12/17/2019 1336   GFRAA >89 08/11/2015 1343    No results found for: SPEP, UPEP  Lab Results  Component Value Date   WBC 7.3 02/17/2020   NEUTROABS 5.1 02/17/2020   HGB 16.3 (H) 02/17/2020   HCT 48.2 (H) 02/17/2020   MCV 92.9 02/17/2020   PLT 382 02/17/2020       Chemistry      Component Value Date/Time   NA 139 12/17/2019 1336   NA 139 09/23/2019 1002   K 3.6 12/17/2019 1336   CL 102 12/17/2019 1336   CO2 27 12/17/2019 1336   BUN 13 12/17/2019 1336   BUN 17 09/23/2019 1002   CREATININE 0.64 12/17/2019 1336   CREATININE 0.87 04/28/2016 1604      Component Value Date/Time   CALCIUM 9.4 12/17/2019 1336   ALKPHOS 58 12/17/2019 1336   AST 21 12/17/2019 1336   ALT 27 12/17/2019 1336   BILITOT 0.3 12/17/2019 1336   BILITOT <0.2 09/23/2019 1002

## 2020-02-17 NOTE — Telephone Encounter (Signed)
Scheduled appointments per 7/26 provider message. Left message on patient voicemail with updated appointment date and time.

## 2020-03-11 DIAGNOSIS — L299 Pruritus, unspecified: Secondary | ICD-10-CM | POA: Diagnosis not present

## 2020-03-11 DIAGNOSIS — I7300 Raynaud's syndrome without gangrene: Secondary | ICD-10-CM | POA: Diagnosis not present

## 2020-03-11 DIAGNOSIS — M1990 Unspecified osteoarthritis, unspecified site: Secondary | ICD-10-CM | POA: Diagnosis not present

## 2020-03-11 DIAGNOSIS — R21 Rash and other nonspecific skin eruption: Secondary | ICD-10-CM | POA: Diagnosis not present

## 2020-04-02 ENCOUNTER — Other Ambulatory Visit: Payer: Self-pay | Admitting: Registered Nurse

## 2020-04-02 DIAGNOSIS — I10 Essential (primary) hypertension: Secondary | ICD-10-CM

## 2020-04-15 ENCOUNTER — Telehealth: Payer: Self-pay | Admitting: *Deleted

## 2020-04-15 NOTE — Telephone Encounter (Signed)
Schedule mobile mammo 9-27 @ [facility]

## 2020-04-16 NOTE — Telephone Encounter (Signed)
Pt called back and stated she can not come get her mammo that day. Please advise.

## 2020-06-11 DIAGNOSIS — R21 Rash and other nonspecific skin eruption: Secondary | ICD-10-CM | POA: Diagnosis not present

## 2020-06-11 DIAGNOSIS — Z23 Encounter for immunization: Secondary | ICD-10-CM | POA: Diagnosis not present

## 2020-06-11 DIAGNOSIS — L299 Pruritus, unspecified: Secondary | ICD-10-CM | POA: Diagnosis not present

## 2020-06-11 DIAGNOSIS — I7300 Raynaud's syndrome without gangrene: Secondary | ICD-10-CM | POA: Diagnosis not present

## 2020-06-11 DIAGNOSIS — M1990 Unspecified osteoarthritis, unspecified site: Secondary | ICD-10-CM | POA: Diagnosis not present

## 2020-06-24 ENCOUNTER — Ambulatory Visit: Payer: BC Managed Care – PPO | Admitting: Registered Nurse

## 2020-06-24 ENCOUNTER — Other Ambulatory Visit: Payer: Self-pay

## 2020-06-24 ENCOUNTER — Encounter: Payer: Self-pay | Admitting: Registered Nurse

## 2020-06-24 VITALS — BP 159/89 | HR 102 | Temp 97.6°F | Resp 18 | Ht 62.0 in | Wt 118.0 lb

## 2020-06-24 DIAGNOSIS — E119 Type 2 diabetes mellitus without complications: Secondary | ICD-10-CM

## 2020-06-24 DIAGNOSIS — I10 Essential (primary) hypertension: Secondary | ICD-10-CM | POA: Diagnosis not present

## 2020-06-24 LAB — POCT GLYCOSYLATED HEMOGLOBIN (HGB A1C): Hemoglobin A1C: 6.4 % — AB (ref 4.0–5.6)

## 2020-06-24 MED ORDER — AMLODIPINE BESY-BENAZEPRIL HCL 10-20 MG PO CAPS: 1.0000 | ORAL_CAPSULE | Freq: Every day | ORAL | 1 refills | Status: DC

## 2020-06-24 NOTE — Progress Notes (Signed)
Established Patient Office Visit  Subjective:  Patient ID: Alyssa Rhodes, female    DOB: Sep 29, 1960  Age: 59 y.o. MRN: [id number]  CC:  Chief Complaint  Patient presents with  . Follow-up    Patient is here for her 6 month follow up on Hypertension and Diabetes. Patient has no other concerns.    HPI Alyssa Rhodes presents for recheck on t2dm and htn  Hypertension: Patient Currently taking: amlodipine 10mg  PO qd Good effect. No AEs. Denies CV symptoms including: chest pain, shob, doe, headache, visual changes, fatigue, claudication, and dependent edema.   Previous readings and labs: BP Readings from Last 3 Encounters:  06/24/20 (!) 159/89  02/17/20 (!) 165/86  01/06/20 140/72   Lab Results  Component Value Date   CREATININE 0.64 12/17/2019    T2dm:  Compliant with medications and lifestyle changes No new concerns No signs of complications  Otherwise patient feeling well overall.  Past Medical History:  Diagnosis Date  . Diabetes mellitus without complication ([place])   . Hyperlipidemia   . Hypertension   . Lupus ([place])   . Thyroid nodule 03/2010   aspiration    Past Surgical History:  Procedure Laterality Date  . ABDOMINAL HYSTERECTOMY    . CESAREAN SECTION    . SHOULDER SURGERY  2011   LEFT    Family History  Problem Relation Age of Onset  . Asthma Mother        doe not know mother well, sister advised her  . Diabetes Mother   . Heart disease Father        heart attack  . Hypertension Father     Social History   Socioeconomic History  . Marital status: Widowed    Spouse name: Not on file  . Number of children: 2  . Years of education: Not on file  . Highest education level: Not on file  Occupational History  . Occupation: Optometrist  Tobacco Use  . Smoking status: Current Every Day Smoker    Packs/day: 1.00    Years: 41.00    Pack years: 41.00    Types: Cigarettes  . Smokeless tobacco: Never Used  Vaping Use  . Vaping Use: Never used   Substance and Sexual Activity  . Alcohol use: Yes    Alcohol/week: 3.0 standard drinks    Types: 3 Standard drinks or equivalent per week    Comment: 3/week  . Drug use: No  . Sexual activity: Not Currently  Other Topics Concern  . Not on file  Social History Narrative  . Not on file   Social Determinants of Health   Financial Resource Strain:   . Difficulty of Paying Living Expenses: Not on file  Food Insecurity:   . Worried About Charity fundraiser in the Last Year: Not on file  . Ran Out of Food in the Last Year: Not on file  Transportation Needs:   . Lack of Transportation (Medical): Not on file  . Lack of Transportation (Non-Medical): Not on file  Physical Activity:   . Days of Exercise per Week: Not on file  . Minutes of Exercise per Session: Not on file  Stress:   . Feeling of Stress : Not on file  Social Connections:   . Frequency of Communication with Friends and Family: Not on file  . Frequency of Social Gatherings with Friends and Family: Not on file  . Attends Religious Services: Not on file  . Active Member of Clubs or Organizations:  Not on file  . Attends Archivist Meetings: Not on file  . Marital Status: Not on file  Intimate Partner Violence:   . Fear of Current or Ex-Partner: Not on file  . Emotionally Abused: Not on file  . Physically Abused: Not on file  . Sexually Abused: Not on file    Outpatient Medications Prior to Visit  Medication Sig Dispense Refill  . folic acid (FOLVITE) 1 MG tablet TAKE 1 TABLET BY MOUTH ONCE DAILY FOR 30 DAYS    . methotrexate 2.5 MG tablet TAKE 4 TABLETS BY MOUTH ONCE A WEEK    . Multiple Vitamin (MULTIVITAMIN) tablet Take 1 tablet by mouth daily.    . Omega-3 Fatty Acids (FISH OIL PO) Take by mouth daily.    Marland Kitchen amLODipine (NORVASC) 10 MG tablet Take 1 tablet by mouth once daily 90 tablet 0  . aspirin EC 81 MG tablet Take 81 mg by mouth daily. (Patient not taking: Reported on 06/24/2020)     No  facility-administered medications prior to visit.    Allergies  Allergen Reactions  . Codeine Nausea And Vomiting    ROS Review of Systems  Constitutional: Negative.   HENT: Negative.   Eyes: Negative.   Respiratory: Negative.   Cardiovascular: Negative.   Gastrointestinal: Negative.   Genitourinary: Negative.   Musculoskeletal: Negative.   Skin: Negative.   Neurological: Negative.   Psychiatric/Behavioral: Negative.       Objective:    Physical Exam Vitals and nursing note reviewed.  Constitutional:      General: She is not in acute distress.    Appearance: Normal appearance. She is normal weight. She is not ill-appearing, toxic-appearing or diaphoretic.  Cardiovascular:     Rate and Rhythm: Normal rate and regular rhythm.     Heart sounds: Normal heart sounds. No murmur heard.  No friction rub. No gallop.   Pulmonary:     Effort: Pulmonary effort is normal. No respiratory distress.     Breath sounds: Normal breath sounds. No stridor. No wheezing, rhonchi or rales.  Chest:     Chest wall: No tenderness.  Skin:    General: Skin is warm and dry.  Neurological:     General: No focal deficit present.     Mental Status: She is alert and oriented to person, place, and time. Mental status is at baseline.  Psychiatric:        Mood and Affect: Mood normal.        Behavior: Behavior normal.        Thought Content: Thought content normal.        Judgment: Judgment normal.     BP (!) 159/89   Pulse (!) 102   Temp 97.6 F (36.4 C) (Temporal)   Resp 18   Ht 5\' 2"  (1.575 m)   Wt 118 lb (53.5 kg)   SpO2 99%   BMI 21.58 kg/m  Wt Readings from Last 3 Encounters:  06/24/20 118 lb (53.5 kg)  02/17/20 113 lb 8 oz (51.5 kg)  12/24/19 111 lb 12.8 oz (50.7 kg)     Health Maintenance Due  Topic Date Due  . MAMMOGRAM  Never done    There are no preventive care reminders to display for this patient.  Lab Results  Component Value Date   TSH 0.524 12/17/2019   Lab  Results  Component Value Date   WBC 7.3 02/17/2020   HGB 16.3 (H) 02/17/2020   HCT 48.2 (H) 02/17/2020  MCV 92.9 02/17/2020   PLT 382 02/17/2020   Lab Results  Component Value Date   NA 139 12/17/2019   K 3.6 12/17/2019   CO2 27 12/17/2019   GLUCOSE 121 (H) 12/17/2019   BUN 13 12/17/2019   CREATININE 0.64 12/17/2019   BILITOT 0.3 12/17/2019   ALKPHOS 58 12/17/2019   AST 21 12/17/2019   ALT 27 12/17/2019   PROT 7.8 12/17/2019   ALBUMIN 4.2 12/17/2019   CALCIUM 9.4 12/17/2019   ANIONGAP 10 12/17/2019   Lab Results  Component Value Date   CHOL 131 09/23/2019   Lab Results  Component Value Date   HDL 52 09/23/2019   Lab Results  Component Value Date   LDLCALC 57 09/23/2019   Lab Results  Component Value Date   TRIG 121 09/23/2019   Lab Results  Component Value Date   CHOLHDL 2.5 09/23/2019   Lab Results  Component Value Date   HGBA1C 6.4 (A) 06/24/2020      Assessment & Plan:   Problem List Items Addressed This Visit    None    Visit Diagnoses    Type 2 diabetes mellitus without complication, without long-term current use of insulin (HCC)    -  Primary   Relevant Medications   amLODipine-benazepril (LOTREL) 10-20 MG capsule   Other Relevant Orders   POCT glycosylated hemoglobin (Hb A1C) (Completed)   Essential hypertension       Relevant Medications   amLODipine-benazepril (LOTREL) 10-20 MG capsule      Meds ordered this encounter  Medications  . amLODipine-benazepril (LOTREL) 10-20 MG capsule    Sig: Take 1 capsule by mouth daily.    Dispense:  90 capsule    Refill:  1    Order Specific Question:   Supervising Provider    Answer:   Carlota Raspberry, JEFFREY R [[id number]]    Follow-up: Return in about 6 months (around 12/23/2020) for t2dm and htn.   PLAN  A1c up to 6.4. buckle down on lifestyle modifications. No acute concerns  bp wnl. Continue plan  Return in 6 mo   Patient encouraged to call clinic with any questions, comments, or concerns.   Maximiano Coss, NP

## 2020-06-24 NOTE — Patient Instructions (Signed)
° ° ° °  If you have lab work done today you will be contacted with your lab results within the next 2 weeks.  If you have not heard from us then please contact us. The fastest way to get your results is to register for My Chart. ° ° °IF you received an x-ray today, you will receive an invoice from [facility]. Please contact [facility] at [phone] with questions or concerns regarding your invoice.  ° °IF you received labwork today, you will receive an invoice from [facility]. Please contact [facility] at [phone] with questions or concerns regarding your invoice.  ° °Our billing staff will not be able to assist you with questions regarding bills from these companies. ° °You will be contacted with the lab results as soon as they are available. The fastest way to get your results is to activate your My Chart account. Instructions are located on the last page of this paperwork. If you have not heard from us regarding the results in 2 weeks, please contact this office. °  ° ° ° °

## 2020-06-29 ENCOUNTER — Encounter: Payer: Self-pay | Admitting: Registered Nurse

## 2020-09-10 DIAGNOSIS — Z720 Tobacco use: Secondary | ICD-10-CM | POA: Diagnosis not present

## 2020-09-10 DIAGNOSIS — M1990 Unspecified osteoarthritis, unspecified site: Secondary | ICD-10-CM | POA: Diagnosis not present

## 2020-09-10 DIAGNOSIS — I7300 Raynaud's syndrome without gangrene: Secondary | ICD-10-CM | POA: Diagnosis not present

## 2020-09-10 DIAGNOSIS — R768 Other specified abnormal immunological findings in serum: Secondary | ICD-10-CM | POA: Diagnosis not present

## 2020-12-10 DIAGNOSIS — M1990 Unspecified osteoarthritis, unspecified site: Secondary | ICD-10-CM | POA: Diagnosis not present

## 2020-12-10 DIAGNOSIS — I7300 Raynaud's syndrome without gangrene: Secondary | ICD-10-CM | POA: Diagnosis not present

## 2020-12-10 DIAGNOSIS — R768 Other specified abnormal immunological findings in serum: Secondary | ICD-10-CM | POA: Diagnosis not present

## 2020-12-10 DIAGNOSIS — Z720 Tobacco use: Secondary | ICD-10-CM | POA: Diagnosis not present

## 2020-12-23 ENCOUNTER — Other Ambulatory Visit: Payer: Self-pay

## 2020-12-23 ENCOUNTER — Encounter: Payer: Self-pay | Admitting: Registered Nurse

## 2020-12-23 ENCOUNTER — Ambulatory Visit: Payer: BC Managed Care – PPO | Admitting: Registered Nurse

## 2020-12-23 VITALS — BP 139/65 | HR 71 | Temp 98.3°F | Resp 18 | Ht 62.0 in | Wt 113.8 lb

## 2020-12-23 DIAGNOSIS — Z1329 Encounter for screening for other suspected endocrine disorder: Secondary | ICD-10-CM | POA: Diagnosis not present

## 2020-12-23 DIAGNOSIS — E785 Hyperlipidemia, unspecified: Secondary | ICD-10-CM

## 2020-12-23 DIAGNOSIS — E119 Type 2 diabetes mellitus without complications: Secondary | ICD-10-CM

## 2020-12-23 DIAGNOSIS — I10 Essential (primary) hypertension: Secondary | ICD-10-CM | POA: Diagnosis not present

## 2020-12-23 DIAGNOSIS — Z1231 Encounter for screening mammogram for malignant neoplasm of breast: Secondary | ICD-10-CM

## 2020-12-23 DIAGNOSIS — Z716 Tobacco abuse counseling: Secondary | ICD-10-CM

## 2020-12-23 LAB — CBC WITH DIFFERENTIAL/PLATELET
Basophils Absolute: 0.1 10*3/uL (ref 0.0–0.1)
Basophils Relative: 0.7 % (ref 0.0–3.0)
Eosinophils Absolute: 0.1 10*3/uL (ref 0.0–0.7)
Eosinophils Relative: 1.4 % (ref 0.0–5.0)
HCT: 47.8 % — ABNORMAL HIGH (ref 36.0–46.0)
Hemoglobin: 16.8 g/dL — ABNORMAL HIGH (ref 12.0–15.0)
Lymphocytes Relative: 11.3 % — ABNORMAL LOW (ref 12.0–46.0)
Lymphs Abs: 1.1 10*3/uL (ref 0.7–4.0)
MCHC: 35.2 g/dL (ref 30.0–36.0)
MCV: 103.8 fl — ABNORMAL HIGH (ref 78.0–100.0)
Monocytes Absolute: 0.9 10*3/uL (ref 0.1–1.0)
Monocytes Relative: 8.7 % (ref 3.0–12.0)
Neutro Abs: 7.8 10*3/uL — ABNORMAL HIGH (ref 1.4–7.7)
Neutrophils Relative %: 77.9 % — ABNORMAL HIGH (ref 43.0–77.0)
Platelets: 437 10*3/uL — ABNORMAL HIGH (ref 150.0–400.0)
RBC: 4.6 Mil/uL (ref 3.87–5.11)
RDW: 15.1 % (ref 11.5–15.5)
WBC: 10 10*3/uL (ref 4.0–10.5)

## 2020-12-23 LAB — COMPREHENSIVE METABOLIC PANEL
ALT: 26 U/L (ref 0–35)
AST: 24 U/L (ref 0–37)
Albumin: 4.3 g/dL (ref 3.5–5.2)
Alkaline Phosphatase: 82 U/L (ref 39–117)
BUN: 10 mg/dL (ref 6–23)
CO2: 30 mEq/L (ref 19–32)
Calcium: 9.7 mg/dL (ref 8.4–10.5)
Chloride: 95 mEq/L — ABNORMAL LOW (ref 96–112)
Creatinine, Ser: 0.48 mg/dL (ref 0.40–1.20)
GFR: 103.28 mL/min (ref >60.00)
Glucose, Bld: 136 mg/dL — ABNORMAL HIGH (ref 70–99)
Potassium: 4 mEq/L (ref 3.5–5.1)
Sodium: 138 mEq/L (ref 135–145)
Total Bilirubin: 0.6 mg/dL (ref 0.2–1.2)
Total Protein: 7.1 g/dL (ref 6.0–8.3)

## 2020-12-23 LAB — TSH: TSH: 0.29 u[IU]/mL — ABNORMAL LOW (ref 0.35–4.50)

## 2020-12-23 LAB — LIPID PANEL
Cholesterol: 147 mg/dL (ref 0–200)
HDL: 35.7 mg/dL — ABNORMAL LOW (ref >39.00)
LDL Cholesterol: 81 mg/dL (ref 0–99)
NonHDL: 111.5
Total CHOL/HDL Ratio: 4
Triglycerides: 155 mg/dL — ABNORMAL HIGH (ref 0.0–149.0)
VLDL: 31 mg/dL (ref 0.0–40.0)

## 2020-12-23 LAB — POCT GLYCOSYLATED HEMOGLOBIN (HGB A1C): Hemoglobin A1C: 6 % — AB (ref 4.0–5.6)

## 2020-12-23 MED ORDER — BUPROPION HCL ER (SR) 150 MG PO TB12: 150.0000 mg | ORAL_TABLET | Freq: Two times a day (BID) | ORAL | 1 refills | Status: DC

## 2020-12-23 MED ORDER — AMLODIPINE BESY-BENAZEPRIL HCL 10-20 MG PO CAPS: 1.0000 | ORAL_CAPSULE | Freq: Every day | ORAL | 1 refills | Status: DC

## 2020-12-23 NOTE — Patient Instructions (Addendum)
Ms Friese -   As always, good to see you.   Blood pressure today is borderline at 139/65. No changes are warranted at this time, just keep a close eye on diet and exercise to ensure this doesn't get much higher. Many recommendations suggest keeping blood pressure below 140/90, some stricter guidelines suggest below 130/80. In any case, we're still much improved from previous readings.  Today, your A1c is at , compared to last time at 6.4. This number will wax and wane, that's fine. Again, just keep a close eye on diet and exercise. For you, I would consider well controlled diabetes to be an A1c of below 7.0 - ideally we maintain that without use of medication, though it's there if we need it.  Quitting smoking can be tough - using Wellbutrin can help. Start with 150mg  once daily, increase that to twice daily if tolerating well. This works best if used every day. Ok to take at the same time as BP medication. Not many side effects but if you have any, let me know.   I'll reach out if I have concerns on today's lab work.  See you again in 6 months,  Rich  If you have lab work done today you will be contacted with your lab results within the next 2 weeks.  If you have not heard from [place] then please contact us. The fastest way to get your results is to register for My Chart.   IF you received an x-ray today, you will receive an invoice from [facility]. Please contact [facility] at [phone] with questions or concerns regarding your invoice.   IF you received labwork today, you will receive an invoice from [facility]. Please contact [facility] at [phone] with questions or concerns regarding your invoice.   Our billing staff will not be able to assist you with questions regarding bills from these companies.  You will be contacted with the lab results as soon as they are available. The fastest way to get your results is to activate your My Chart account. Instructions are  located on the last page of this paperwork. If you have not heard from [place] regarding the results in 2 weeks, please contact this office.     Hypertension, Adult High blood pressure (hypertension) is when the force of blood pumping through the arteries is too strong. The arteries are the blood vessels that carry blood from the heart throughout the body. Hypertension forces the heart to work harder to pump blood and may cause arteries to become narrow or stiff. Untreated or uncontrolled hypertension can cause a heart attack, heart failure, a stroke, kidney disease, and other problems. A blood pressure reading consists of a higher number over a lower number. Ideally, your blood pressure should be below 120/80. The first ("top") number is called the systolic pressure. It is a measure of the pressure in your arteries as your heart beats. The second ("bottom") number is called the diastolic pressure. It is a measure of the pressure in your arteries as the heart relaxes. What are the causes? The exact cause of this condition is not known. There are some conditions that result in or are related to high blood pressure. What increases the risk? Some risk factors for high blood pressure are under your control. The following factors may make you more likely to develop this condition:  Smoking.  Having type 2 diabetes mellitus, high cholesterol, or both.  Not getting enough exercise or physical activity.  Being overweight.  Having too much fat, sugar, calories, or salt (sodium) in your diet.  Drinking too much alcohol. Some risk factors for high blood pressure may be difficult or impossible to change. Some of these factors include:  Having chronic kidney disease.  Having a family history of high blood pressure.  Age. Risk increases with age.  Race. You may be at higher risk if you are African American.  Gender. Men are at higher risk than women before age 19. After age 49, women are at higher risk  than men.  Having obstructive sleep apnea.  Stress. What are the signs or symptoms? High blood pressure may not cause symptoms. Very high blood pressure (hypertensive crisis) may cause:  Headache.  Anxiety.  Shortness of breath.  Nosebleed.  Nausea and vomiting.  Vision changes.  Severe chest pain.  Seizures. How is this diagnosed? This condition is diagnosed by measuring your blood pressure while you are seated, with your arm resting on a flat surface, your legs uncrossed, and your feet flat on the floor. The cuff of the blood pressure monitor will be placed directly against the skin of your upper arm at the level of your heart. It should be measured at least twice using the same arm. Certain conditions can cause a difference in blood pressure between your right and left arms. Certain factors can cause blood pressure readings to be lower or higher than normal for a short period of time:  When your blood pressure is higher when you are in a health care provider's office than when you are at home, this is called white coat hypertension. Most people with this condition do not need medicines.  When your blood pressure is higher at home than when you are in a health care provider's office, this is called masked hypertension. Most people with this condition may need medicines to control blood pressure. If you have a high blood pressure reading during one visit or you have normal blood pressure with other risk factors, you may be asked to:  Return on a different day to have your blood pressure checked again.  Monitor your blood pressure at home for 1 week or longer. If you are diagnosed with hypertension, you may have other blood or imaging tests to help your health care provider understand your overall risk for other conditions. How is this treated? This condition is treated by making healthy lifestyle changes, such as eating healthy foods, exercising more, and reducing your alcohol  intake. Your health care provider may prescribe medicine if lifestyle changes are not enough to get your blood pressure under control, and if:  Your systolic blood pressure is above 130.  Your diastolic blood pressure is above 80. Your personal target blood pressure may vary depending on your medical conditions, your age, and other factors. Follow these instructions at home: Eating and drinking  Eat a diet that is high in fiber and potassium, and low in sodium, added sugar, and fat. An example eating plan is called the DASH (Dietary Approaches to Stop Hypertension) diet. To eat this way: ? Eat plenty of fresh fruits and vegetables. Try to fill one half of your plate at each meal with fruits and vegetables. ? Eat whole grains, such as whole-wheat pasta, brown rice, or whole-grain bread. Fill about one fourth of your plate with whole grains. ? Eat or drink low-fat dairy products, such as skim milk or low-fat yogurt. ? Avoid fatty cuts of meat, processed or cured meats, and poultry with skin. Fill  about one fourth of your plate with lean proteins, such as fish, chicken without skin, beans, eggs, or tofu. ? Avoid pre-made and processed foods. These tend to be higher in sodium, added sugar, and fat.  Reduce your daily sodium intake. Most people with hypertension should eat less than 1,500 mg of sodium a day.  Do not drink alcohol if: ? Your health care provider tells you not to drink. ? You are pregnant, may be pregnant, or are planning to become pregnant.  If you drink alcohol: ? Limit how much you use to:  0-1 drink a day for women.  0-2 drinks a day for men. ? Be aware of how much alcohol is in your drink. In the [place] one drink equals one 12 oz bottle of beer (355 mL), one 5 oz glass of wine (148 mL), or one 1 oz glass of hard liquor (44 mL).   Lifestyle  Work with your health care provider to maintain a healthy body weight or to lose weight. Ask what an ideal weight is for you.  Get  at least 30 minutes of exercise most days of the week. Activities may include walking, swimming, or biking.  Include exercise to strengthen your muscles (resistance exercise), such as Pilates or lifting weights, as part of your weekly exercise routine. Try to do these types of exercises for 30 minutes at least 3 days a week.  Do not use any products that contain nicotine or tobacco, such as cigarettes, e-cigarettes, and chewing tobacco. If you need help quitting, ask your health care provider.  Monitor your blood pressure at home as told by your health care provider.  Keep all follow-up visits as told by your health care provider. This is important.   Medicines  Take over-the-counter and prescription medicines only as told by your health care provider. Follow directions carefully. Blood pressure medicines must be taken as prescribed.  Do not skip doses of blood pressure medicine. Doing this puts you at risk for problems and can make the medicine less effective.  Ask your health care provider about side effects or reactions to medicines that you should watch for. Contact a health care provider if you:  Think you are having a reaction to a medicine you are taking.  Have headaches that keep coming back (recurring).  Feel dizzy.  Have swelling in your ankles.  Have trouble with your vision. Get help right away if you:  Develop a severe headache or confusion.  Have unusual weakness or numbness.  Feel faint.  Have severe pain in your chest or abdomen.  Vomit repeatedly.  Have trouble breathing. Summary  Hypertension is when the force of blood pumping through your arteries is too strong. If this condition is not controlled, it may put you at risk for serious complications.  Your personal target blood pressure may vary depending on your medical conditions, your age, and other factors. For most people, a normal blood pressure is less than 120/80.  Hypertension is treated with  lifestyle changes, medicines, or a combination of both. Lifestyle changes include losing weight, eating a healthy, low-sodium diet, exercising more, and limiting alcohol. This information is not intended to replace advice given to you by your health care provider. Make sure you discuss any questions you have with your health care provider. Document Revised: 03/21/2018 Document Reviewed: 03/21/2018 Elsevier Patient Education  2021 [place] Your Hypertension Hypertension, also called high blood pressure, is when the force of the blood pressing against the  walls of the arteries is too strong. Arteries are blood vessels that carry blood from your heart throughout your body. Hypertension forces the heart to work harder to pump blood and may cause the arteries to become narrow or stiff. Understanding blood pressure readings Your personal target blood pressure may vary depending on your medical conditions, your age, and other factors. A blood pressure reading includes a higher number over a lower number. Ideally, your blood pressure should be below 120/80. You should know that:  The first, or top, number is called the systolic pressure. It is a measure of the pressure in your arteries as your heart beats.  The second, or bottom number, is called the diastolic pressure. It is a measure of the pressure in your arteries as the heart relaxes. Blood pressure is classified into four stages. Based on your blood pressure reading, your health care provider may use the following stages to determine what type of treatment you need, if any. Systolic pressure and diastolic pressure are measured in a unit called mmHg. Normal  Systolic pressure: below 440.  Diastolic pressure: below 80. Elevated  Systolic pressure: 347-425.  Diastolic pressure: below 80. Hypertension stage 1  Systolic pressure: 956-387.  Diastolic pressure: 56-43. Hypertension stage 2  Systolic pressure: 329 or  above.  Diastolic pressure: 90 or above. How can this condition affect me? Managing your hypertension is an important responsibility. Over time, hypertension can damage the arteries and decrease blood flow to important parts of the body, including the brain, heart, and kidneys. Having untreated or uncontrolled hypertension can lead to:  A heart attack.  A stroke.  A weakened blood vessel (aneurysm).  Heart failure.  Kidney damage.  Eye damage.  Metabolic syndrome.  Memory and concentration problems.  Vascular dementia. What actions can I take to manage this condition? Hypertension can be managed by making lifestyle changes and possibly by taking medicines. Your health care provider will help you make a plan to bring your blood pressure within a normal range. Nutrition  Eat a diet that is high in fiber and potassium, and low in salt (sodium), added sugar, and fat. An example eating plan is called the Dietary Approaches to Stop Hypertension (DASH) diet. To eat this way: ? Eat plenty of fresh fruits and vegetables. Try to fill one-half of your plate at each meal with fruits and vegetables. ? Eat whole grains, such as whole-wheat pasta, brown rice, or whole-grain bread. Fill about one-fourth of your plate with whole grains. ? Eat low-fat dairy products. ? Avoid fatty cuts of meat, processed or cured meats, and poultry with skin. Fill about one-fourth of your plate with lean proteins such as fish, chicken without skin, beans, eggs, and tofu. ? Avoid pre-made and processed foods. These tend to be higher in sodium, added sugar, and fat.  Reduce your daily sodium intake. Most people with hypertension should eat less than 1,500 mg of sodium a day.   Lifestyle  Work with your health care provider to maintain a healthy body weight or to lose weight. Ask what an ideal weight is for you.  Get at least 30 minutes of exercise that causes your heart to beat faster (aerobic exercise) most days  of the week. Activities may include walking, swimming, or biking.  Include exercise to strengthen your muscles (resistance exercise), such as weight lifting, as part of your weekly exercise routine. Try to do these types of exercises for 30 minutes at least 3 days a week.  Do not  use any products that contain nicotine or tobacco, such as cigarettes, e-cigarettes, and chewing tobacco. If you need help quitting, ask your health care provider.  Control any long-term (chronic) conditions you have, such as high cholesterol or diabetes.  Identify your sources of stress and find ways to manage stress. This may include meditation, deep breathing, or making time for fun activities.   Alcohol use  Do not drink alcohol if: ? Your health care provider tells you not to drink. ? You are pregnant, may be pregnant, or are planning to become pregnant.  If you drink alcohol: ? Limit how much you use to:  0-1 drink a day for women.  0-2 drinks a day for men. ? Be aware of how much alcohol is in your drink. In the [place] one drink equals one 12 oz bottle of beer (355 mL), one 5 oz glass of wine (148 mL), or one 1 oz glass of hard liquor (44 mL). Medicines Your health care provider may prescribe medicine if lifestyle changes are not enough to get your blood pressure under control and if:  Your systolic blood pressure is 130 or higher.  Your diastolic blood pressure is 80 or higher. Take medicines only as told by your health care provider. Follow the directions carefully. Blood pressure medicines must be taken as told by your health care provider. The medicine does not work as well when you skip doses. Skipping doses also puts you at risk for problems. Monitoring Before you monitor your blood pressure:  Do not smoke, drink caffeinated beverages, or exercise within 30 minutes before taking a measurement.  Use the bathroom and empty your bladder (urinate).  Sit quietly for at least 5 minutes before  taking measurements. Monitor your blood pressure at home as told by your health care provider. To do this:  Sit with your back straight and supported.  Place your feet flat on the floor. Do not cross your legs.  Support your arm on a flat surface, such as a table. Make sure your upper arm is at heart level.  Each time you measure, take two or three readings one minute apart and record the results. You may also need to have your blood pressure checked regularly by your health care provider.   General information  Talk with your health care provider about your diet, exercise habits, and other lifestyle factors that may be contributing to hypertension.  Review all the medicines you take with your health care provider because there may be side effects or interactions.  Keep all visits as told by your health care provider. Your health care provider can help you create and adjust your plan for managing your high blood pressure. Where to find more information  National Heart, Lung, and Blood Institute: https://wilson-eaton.com/  American Heart Association: www.heart.org Contact a health care provider if:  You think you are having a reaction to medicines you have taken.  You have repeated (recurrent) headaches.  You feel dizzy.  You have swelling in your ankles.  You have trouble with your vision. Get help right away if:  You develop a severe headache or confusion.  You have unusual weakness or numbness, or you feel faint.  You have severe pain in your chest or abdomen.  You vomit repeatedly.  You have trouble breathing. These symptoms may represent a serious problem that is an emergency. Do not wait to see if the symptoms will go away. Get medical help right away. Call your local emergency services (911  in the [place] Do not drive yourself to the hospital. Summary  Hypertension is when the force of blood pumping through your arteries is too strong. If this condition is not controlled,  it may put you at risk for serious complications.  Your personal target blood pressure may vary depending on your medical conditions, your age, and other factors. For most people, a normal blood pressure is less than 120/80.  Hypertension is managed by lifestyle changes, medicines, or both.  Lifestyle changes to help manage hypertension include losing weight, eating a healthy, low-sodium diet, exercising more, stopping smoking, and limiting alcohol. This information is not intended to replace advice given to you by your health care provider. Make sure you discuss any questions you have with your health care provider. Document Revised: 08/16/2019 Document Reviewed: 06/11/2019 Elsevier Patient Education  2021 [place].  Preventing Hypertension Hypertension, also called high blood pressure, is when the force of blood pumping through the arteries is too strong. Arteries are blood vessels that carry blood from the heart throughout the body. Often, hypertension does not cause symptoms until blood pressure is very high. It is important to have your blood pressure checked regularly. Diet and lifestyle changes can help you prevent hypertension, and they may make you feel better overall and improve your quality of life. If you already have hypertension, you may control it with diet and lifestyle changes, as well as with medicine. How can this condition affect me? Over time, hypertension can damage the arteries and decrease blood flow to important parts of the body, including the brain, heart, and kidneys. By keeping your blood pressure in a healthy range, you can help prevent complications like heart attack, heart failure, stroke, kidney failure, and vascular dementia. What can increase my risk?  Being an older adult. Older people are more often affected.  Having family members who have had high blood pressure.  Being obese.  Being female. Males are more likely to have high blood  pressure.  Drinking too much alcohol or caffeine.  Smoking or using illegal drugs.  Taking certain medicines, such as antidepressants, decongestants, birth control pills, and NSAIDs, such as ibuprofen.  Having thyroid problems.  Having certain tumors. What actions can I take to prevent or manage this condition? Work with your health care provider to make a hypertension prevention plan that works for you. Follow your plan and keep all follow-up visits as told by your health care provider. Diet changes Maintain a healthy diet. This includes:  Eating less salt (sodium). Ask your health care provider how much sodium is safe for you to have. The general recommendation is to have less than 1 tsp (2,300 mg) of sodium a day. ? Do not add salt to your food. ? Choose low-sodium options when grocery shopping and eating out.  Limiting fats in your diet. You can do this by eating low-fat or fat-free dairy products and by eating less red meat.  Eating more fruits, vegetables, and whole grains. Make a goal to eat: ? 1-2 cups of fresh fruits and vegetables each day. ? 3-4 servings of whole grains each day.  Avoiding foods and beverages that have added sugars.  Eating fish that contain healthy fats (omega-3 fatty acids), such as mackerel or salmon. If you need help putting together a healthy eating plan, try the DASH diet. This diet is high in fruits, vegetables, and whole grains. It is low in sodium, red meat, and added sugars. DASH stands for Dietary Approaches to Stop Hypertension.  Lifestyle changes Lose weight if you are overweight. Losing just 3?5% of your body weight can help prevent or control hypertension. For example, if your present weight is 200 lb (91 kg), a loss of 3-5% of your weight means losing 6-10 lb (2.7-4.5 kg). Ask your health care provider to help you with a diet and exercise plan to safely lose weight. Other recommendations usually include:  Get enough exercise. Do at least  150 minutes of moderate-intensity exercise each week. You could do this in short exercise sessions several times a day, or you could do longer exercise sessions a few times a week. For example, you could take a brisk 10-minute walk or bike ride, 3 times a day, for 5 days a week.  Find ways to reduce stress, such as exercising, meditating, listening to music, or taking a yoga class. If you need help reducing stress, ask your health care provider.  Do not use any products that contain nicotine or tobacco, such as cigarettes, e-cigarettes, and chewing tobacco. If you need help quitting, ask your health care provider. Chemicals in tobacco and nicotine products raise your blood pressure each time you use them. If you need help quitting, ask your health care provider.  Learn how to check your blood pressure at home. Make sure that you know your personal target blood pressure, as told by your health care provider.  Try to sleep 7-9 hours per night.   Alcohol use  Do not drink alcohol if: ? Your health care provider tells you not to drink. ? You are pregnant, may be pregnant, or are planning to become pregnant.  If you drink alcohol: ? Limit how much you use to:  0-1 drink a day for women.  0-2 drinks a day for men. ? Be aware of how much alcohol is in your drink. In the [place] one drink equals one 12 oz bottle of beer (355 mL), one 5 oz glass of wine (148 mL), or one 1 oz glass of hard liquor (44 mL). Medicines In addition to diet and lifestyle changes, your health care provider may recommend medicines to help lower your blood pressure. In general:  You may need to try a few different medicines to find what works best for you.  You may need to take more than one medicine.  Take over-the-counter and prescription medicines only as told by your health care provider. Questions to ask your health care provider  What is my blood pressure goal?  How can I lower my risk for high blood  pressure?  How should I monitor my blood pressure at home? Where to find support Your health care provider can help you prevent hypertension and help you keep your blood pressure at a healthy level. Your local hospital or your community may also provide support services and prevention programs. The American Heart Association offers an online support network at supportnetwork.heart.org Where to find more information Learn more about hypertension from:  [place], Lung, and [place]: https://wilson-eaton.com/  Centers for Disease Control and Prevention: http://www.wolf.info/  American Academy of Family Physicians: familydoctor.org Learn more about the DASH diet from:  [place], Lung, and [place]: https://wilson-eaton.com/ Contact a health care provider if:  You think you are having a reaction to medicines you have taken.  You have recurrent headaches or feel dizzy.  You have swelling in your ankles.  You have trouble with your vision. Get help right away if:  You have sudden, severe chest, back, or abdominal pain or  discomfort.  You have shortness of breath.  You have a sudden, severe headache. These symptoms may represent a serious problem that is an emergency. Do not wait to see if the symptoms will go away. Get medical help right away. Call your local emergency services (911 in the [place] Do not drive yourself to the hospital.  Summary  Hypertension often does not cause any symptoms until blood pressure is very high. It is important to get your blood pressure checked regularly.  Diet and lifestyle changes are important steps in preventing hypertension.  By keeping your blood pressure in a healthy range, you may prevent complications like heart attack, heart failure, stroke, and kidney failure.  Work with your health care provider to make a hypertension prevention plan that works for you. This information is not intended to replace advice given to you by your health care  provider. Make sure you discuss any questions you have with your health care provider. Document Revised: 06/11/2019 Document Reviewed: 06/11/2019 Elsevier Patient Education  2021 [place].  Steps to Quit Smoking Smoking tobacco is the leading cause of preventable death. It can affect almost every organ in the body. Smoking puts you and those around you at risk for developing many serious chronic diseases. Quitting smoking can be difficult, but it is one of the best things that you can do for your health. It is never too late to quit. How do I get ready to quit? When you decide to quit smoking, create a plan to help you succeed. Before you quit:  Pick a date to quit. Set a date within the next 2 weeks to give you time to prepare.  Write down the reasons why you are quitting. Keep this list in places where you will see it often.  Tell your family, friends, and co-workers that you are quitting. Support from your loved ones can make quitting easier.  Talk with your health care provider about your options for quitting smoking.  Find out what treatment options are covered by your health insurance.  Identify people, places, things, and activities that make you want to smoke (triggers). Avoid them. What first steps can I take to quit smoking?  Throw away all cigarettes at home, at work, and in your car.  Throw away smoking accessories, such as Scientist, research (medical).  Clean your car. Make sure to empty the ashtray.  Clean your home, including curtains and carpets. What strategies can I use to quit smoking? Talk with your health care provider about combining strategies, such as taking medicines while you are also receiving in-person counseling. Using these two strategies together makes you more likely to succeed in quitting than if you used either strategy on its own.  If you are pregnant or breastfeeding, talk with your health care provider about finding counseling or other support  strategies to quit smoking. Do not take medicine to help you quit smoking unless your health care provider tells you to do so. To quit smoking: Quit right away  Quit smoking completely, instead of gradually reducing how much you smoke over a period of time. Research shows that stopping smoking right away is more successful than gradually quitting.  Attend in-person counseling to help you build problem-solving skills. You are more likely to succeed in quitting if you attend counseling sessions regularly. Even short sessions of 10 minutes can be effective. Take medicine You may take medicines to help you quit smoking. Some medicines require a prescription and some you can purchase over-the-counter.  Medicines may have nicotine in them to replace the nicotine in cigarettes. Medicines may:  Help to stop cravings.  Help to relieve withdrawal symptoms. Your health care provider may recommend:  Nicotine patches, gum, or lozenges.  Nicotine inhalers or sprays.  Non-nicotine medicine that is taken by mouth. Find resources Find resources and support systems that can help you to quit smoking and remain smoke-free after you quit. These resources are most helpful when you use them often. They include:  Online chats with a Social worker.  Telephone quitlines.  Printed Furniture conservator/restorer.  Support groups or group counseling.  Text messaging programs.  Mobile phone apps or applications. Use apps that can help you stick to your quit plan by providing reminders, tips, and encouragement. There are many free apps for mobile devices as well as websites. Examples include Quit Guide from the [employer] and smokefree.gov   What things can I do to make it easier to quit?  Reach out to your family and friends for support and encouragement. Call telephone quitlines (1-800-QUIT-NOW), reach out to support groups, or work with a counselor for support.  Ask people who smoke to avoid smoking around you.  Avoid places  that trigger you to smoke, such as bars, parties, or smoke-break areas at work.  Spend time with people who do not smoke.  Lessen the stress in your life. Stress can be a smoking trigger for some people. To lessen stress, try: ? Exercising regularly. ? Doing deep-breathing exercises. ? Doing yoga. ? Meditating. ? Performing a body scan. This involves closing your eyes, scanning your body from head to toe, and noticing which parts of your body are particularly tense. Try to relax the muscles in those areas.   How will I feel when I quit smoking? Day 1 to 3 weeks Within the first 24 hours of quitting smoking, you may start to feel withdrawal symptoms. These symptoms are usually most noticeable 2-3 days after quitting, but they usually do not last for more than 2-3 weeks. You may experience these symptoms:  Mood swings.  Restlessness, anxiety, or irritability.  Trouble concentrating.  Dizziness.  Strong cravings for sugary foods and nicotine.  Mild weight gain.  Constipation.  Nausea.  Coughing or a sore throat.  Changes in how the medicines that you take for unrelated issues work in your body.  Depression.  Trouble sleeping (insomnia). Week 3 and afterward After the first 2-3 weeks of quitting, you may start to notice more positive results, such as:  Improved sense of smell and taste.  Decreased coughing and sore throat.  Slower heart rate.  Lower blood pressure.  Clearer skin.  The ability to breathe more easily.  Fewer sick days. Quitting smoking can be very challenging. Do not get discouraged if you are not successful the first time. Some people need to make many attempts to quit before they achieve long-term success. Do your best to stick to your quit plan, and talk with your health care provider if you have any questions or concerns. Summary  Smoking tobacco is the leading cause of preventable death. Quitting smoking is one of the best things that you can do  for your health.  When you decide to quit smoking, create a plan to help you succeed.  Quit smoking right away, not slowly over a period of time.  When you start quitting, seek help from your health care provider, family, or friends. This information is not intended to replace advice given to you by your health  care provider. Make sure you discuss any questions you have with your health care provider. Document Revised: 04/05/2019 Document Reviewed: 09/29/2018 Elsevier Patient Education  2021 [place] of Quitting Smoking Quitting smoking is a physical and mental challenge. You will face cravings, withdrawal symptoms, and temptation. Before quitting, work with your health care provider to make a plan that can help you manage quitting. Preparation can help you quit and keep you from giving in. How to manage lifestyle changes Managing stress Stress can make you want to smoke, and wanting to smoke may cause stress. It is important to find ways to manage your stress. You might try some of the following:  Practice relaxation techniques. ? Breathe slowly and deeply, in through your nose and out through your mouth. ? Listen to music. ? Soak in a bath or take a shower. ? Imagine a peaceful place or vacation.  Get some support. ? Talk with family or friends about your stress. ? Join a support group. ? Talk with a counselor or therapist.  Get some physical activity. ? Go for a walk, run, or bike ride. ? Play a favorite sport. ? Practice yoga.   Medicines Talk with your health care provider about medicines that might help you deal with cravings and make quitting easier for you. Relationships Social situations can be difficult when you are quitting smoking. To manage this, you can:  Avoid parties and other social situations where people might be smoking.  Avoid alcohol.  Leave right away if you have the urge to smoke.  Explain to your family and friends that  you are quitting smoking. Ask for support and let them know you might be a bit grumpy.  Plan activities where smoking is not an option. General instructions Be aware that many people gain weight after they quit smoking. However, not everyone does. To keep from gaining weight, have a plan in place before you quit and stick to the plan after you quit. Your plan should include:  Having healthy snacks. When you have a craving, it may help to: ? Eat popcorn, carrots, celery, or other cut vegetables. ? Chew sugar-free gum.  Changing how you eat. ? Eat small portion sizes at meals. ? Eat 4-6 small meals throughout the day instead of 1-2 large meals a day. ? Be mindful when you eat. Do not watch television or do other things that might distract you as you eat.  Exercising regularly. ? Make time to exercise each day. If you do not have time for a long workout, do short bouts of exercise for 5-10 minutes several times a day. ? Do some form of strengthening exercise, such as weight lifting. ? Do some exercise that gets your heart beating and causes you to breathe deeply, such as walking fast, running, swimming, or biking. This is very important.  Drinking plenty of water or other low-calorie or no-calorie drinks. Drink 6-8 glasses of water daily.   How to recognize withdrawal symptoms Your body and mind may experience discomfort as you try to get used to not having nicotine in your system. These effects are called withdrawal symptoms. They may include:  Feeling hungrier than normal.  Having trouble concentrating.  Feeling irritable or restless.  Having trouble sleeping.  Feeling depressed.  Craving a cigarette. To manage withdrawal symptoms:  Avoid places, people, and activities that trigger your cravings.  Remember why you want to quit.  Get plenty of sleep.  Avoid coffee and other caffeinated  drinks. These may worsen some of your symptoms. These symptoms may surprise you. But be  assured that they are normal to have when quitting smoking. How to manage cravings Come up with a plan for how to deal with your cravings. The plan should include the following:  A definition of the specific situation you want to deal with.  An alternative action you will take.  A clear idea for how this action will help.  The name of someone who might help you with this. Cravings usually last for 5-10 minutes. Consider taking the following actions to help you with your plan to deal with cravings:  Keep your mouth busy. ? Chew sugar-free gum. ? Suck on hard candies or a straw. ? Brush your teeth.  Keep your hands and body busy. ? Change to a different activity right away. ? Squeeze or play with a ball. ? Do an activity or a hobby, such as making bead jewelry, practicing needlepoint, or working with wood. ? Mix up your normal routine. ? Take a short exercise break. Go for a quick walk or run up and down stairs.  Focus on doing something kind or helpful for someone else.  Call a friend or family member to talk during a craving.  Join a support group.  Contact a quitline. Where to find support To get help or find a support group:  Call the [facility]'s Smoking Quitline: 1-800-QUIT NOW (484) 082-7433)  Visit the website of the Substance Abuse and [place]: ktimeonline.com  Text QUIT to SmokefreeTXT: 287681 Where to find more information Visit these websites to find more information on quitting smoking:  [place]: www.smokefree.gov  American Lung Association: www.lung.org  American Cancer Society: www.cancer.org  Centers for Disease Control and Prevention: http://www.wolf.info/  American Heart Association: www.heart.org Contact a health care provider if:  You want to change your plan for quitting.  The medicines you are taking are not helping.  Your eating feels out of control or you cannot sleep. Get help right away  if:  You feel depressed or become very anxious. Summary  Quitting smoking is a physical and mental challenge. You will face cravings, withdrawal symptoms, and temptation to smoke again. Preparation can help you as you go through these challenges.  Try different techniques to manage stress, handle social situations, and prevent weight gain.  You can deal with cravings by keeping your mouth busy (such as by chewing gum), keeping your hands and body busy, calling family or friends, or contacting a quitline for people who want to quit smoking.  You can deal with withdrawal symptoms by avoiding places where people smoke, getting plenty of rest, and avoiding drinks with caffeine. This information is not intended to replace advice given to you by your health care provider. Make sure you discuss any questions you have with your health care provider. Document Revised: 04/30/2019 Document Reviewed: 04/30/2019 Elsevier Patient Education  [place].

## 2020-12-23 NOTE — Progress Notes (Signed)
Established Patient Office Visit  Subjective:  Patient ID: Alyssa Rhodes, female    DOB: February 05, 1961  Age: 60 y.o. MRN: [id number]  CC:  Chief Complaint  Patient presents with  . Follow-up    Patient states she is here for follow up for diabetes and HTN.    HPI Alyssa Rhodes presents for follow up   Hypertension: Patient Currently taking: amlodipine-benazepril 10-20mg  PO qd Subtherapeutic effect. No AEs. Denies CV symptoms including: chest pain, shob, doe, headache, visual changes, fatigue, claudication, and dependent edema.   Previous readings and labs: BP Readings from Last 3 Encounters:  06/24/20 (!) 159/89  02/17/20 (!) 165/86  01/06/20 140/72   Lab Results  Component Value Date   CREATININE 0.64 12/17/2019   Diabetes Last A1c:  Lab Results  Component Value Date   HGBA1C 6.4 (A) 06/24/2020    Currently taking: lifestyle control, no medications No new complications Reports good compliance with lifestyle modifications Diet has been steady, no changes Exercise habits have been steady   Screening: Due for mammography. Order to be placed.  Past Medical History:  Diagnosis Date  . Diabetes mellitus without complication ([place])   . Hyperlipidemia   . Hypertension   . Lupus ([place])   . Thyroid nodule 03/2010   aspiration    Past Surgical History:  Procedure Laterality Date  . ABDOMINAL HYSTERECTOMY    . CESAREAN SECTION    . SHOULDER SURGERY  2011   LEFT    Family History  Problem Relation Age of Onset  . Asthma Mother        doe not know mother well, sister advised her  . Diabetes Mother   . Heart disease Father        heart attack  . Hypertension Father     Social History   Socioeconomic History  . Marital status: Widowed    Spouse name: Not on file  . Number of children: 2  . Years of education: Not on file  . Highest education level: Not on file  Occupational History  . Occupation: Optometrist  Tobacco Use  . Smoking status: Current Every  Day Smoker    Packs/day: 1.00    Years: 41.00    Pack years: 41.00    Types: Cigarettes  . Smokeless tobacco: Never Used  Vaping Use  . Vaping Use: Never used  Substance and Sexual Activity  . Alcohol use: Yes    Alcohol/week: 3.0 standard drinks    Types: 3 Standard drinks or equivalent per week    Comment: 3/week  . Drug use: No  . Sexual activity: Not Currently  Other Topics Concern  . Not on file  Social History Narrative  . Not on file   Social Determinants of Health   Financial Resource Strain: Not on file  Food Insecurity: Not on file  Transportation Needs: Not on file  Physical Activity: Not on file  Stress: Not on file  Social Connections: Not on file  Intimate Partner Violence: Not on file    Outpatient Medications Prior to Visit  Medication Sig Dispense Refill  . amLODipine-benazepril (LOTREL) 10-20 MG capsule Take 1 capsule by mouth daily. 90 capsule 1  . folic acid (FOLVITE) 1 MG tablet TAKE 1 TABLET BY MOUTH ONCE DAILY FOR 30 DAYS    . methotrexate 2.5 MG tablet TAKE 4 TABLETS BY MOUTH ONCE A WEEK    . Multiple Vitamin (MULTIVITAMIN) tablet Take 1 tablet by mouth daily.    . Omega-3  Fatty Acids (FISH OIL PO) Take by mouth daily.    Marland Kitchen aspirin EC 81 MG tablet Take 81 mg by mouth daily. (Patient not taking: No sig reported)     No facility-administered medications prior to visit.    Allergies  Allergen Reactions  . Codeine Nausea And Vomiting    ROS Review of Systems  Constitutional: Negative.   HENT: Negative.   Eyes: Negative.   Respiratory: Negative.   Cardiovascular: Negative.   Gastrointestinal: Negative.   Genitourinary: Negative.   Musculoskeletal: Negative.   Skin: Negative.   Neurological: Negative.   Psychiatric/Behavioral: Negative.   All other systems reviewed and are negative.     Objective:    Physical Exam Vitals and nursing note reviewed.  Constitutional:      General: She is not in acute distress.    Appearance: Normal  appearance. She is normal weight. She is not ill-appearing, toxic-appearing or diaphoretic.  Cardiovascular:     Rate and Rhythm: Normal rate and regular rhythm.     Heart sounds: Normal heart sounds. No murmur heard. No friction rub. No gallop.   Pulmonary:     Effort: Pulmonary effort is normal. No respiratory distress.     Breath sounds: Normal breath sounds. No stridor. No wheezing, rhonchi or rales.  Chest:     Chest wall: No tenderness.  Skin:    General: Skin is warm and dry.  Neurological:     General: No focal deficit present.     Mental Status: She is alert and oriented to person, place, and time. Mental status is at baseline.  Psychiatric:        Mood and Affect: Mood normal.        Behavior: Behavior normal.        Thought Content: Thought content normal.        Judgment: Judgment normal.     There were no vitals taken for this visit. Wt Readings from Last 3 Encounters:  06/24/20 118 lb (53.5 kg)  02/17/20 113 lb 8 oz (51.5 kg)  12/24/19 111 lb 12.8 oz (50.7 kg)     Health Maintenance Due  Topic Date Due  . MAMMOGRAM  Never done    There are no preventive care reminders to display for this patient.  Lab Results  Component Value Date   TSH 0.524 12/17/2019   Lab Results  Component Value Date   WBC 7.3 02/17/2020   HGB 16.3 (H) 02/17/2020   HCT 48.2 (H) 02/17/2020   MCV 92.9 02/17/2020   PLT 382 02/17/2020   Lab Results  Component Value Date   NA 139 12/17/2019   K 3.6 12/17/2019   CO2 27 12/17/2019   GLUCOSE 121 (H) 12/17/2019   BUN 13 12/17/2019   CREATININE 0.64 12/17/2019   BILITOT 0.3 12/17/2019   ALKPHOS 58 12/17/2019   AST 21 12/17/2019   ALT 27 12/17/2019   PROT 7.8 12/17/2019   ALBUMIN 4.2 12/17/2019   CALCIUM 9.4 12/17/2019   ANIONGAP 10 12/17/2019   Lab Results  Component Value Date   CHOL 131 09/23/2019   Lab Results  Component Value Date   HDL 52 09/23/2019   Lab Results  Component Value Date   LDLCALC 57 09/23/2019    Lab Results  Component Value Date   TRIG 121 09/23/2019   Lab Results  Component Value Date   CHOLHDL 2.5 09/23/2019   Lab Results  Component Value Date   HGBA1C 6.4 (A) 06/24/2020  Assessment & Plan:   Problem List Items Addressed This Visit   None   Visit Diagnoses    Type 2 diabetes mellitus without complication, without long-term current use of insulin ([place])    -  Primary   Relevant Orders   POCT HgB A1C   Screening mammogram, encounter for       Essential hypertension       Relevant Orders   CBC with Differential/Platelet   Comprehensive metabolic panel   Hyperlipidemia, unspecified hyperlipidemia type       Relevant Orders   Lipid panel   Screening for thyroid disorder       Relevant Orders   TSH      No orders of the defined types were placed in this encounter.   Follow-up: No follow-ups on file.   PLAN  BP today borderline. Ok to continue current plan. Reviewed heart healthy diet.  A1c today 6.0, no changes warranted, encouraged patient  Will start wellbutrin for smoking cessation  Labs collected. Will follow up with the patient as warranted.  Order sent for mammogram screening  Patient encouraged to call clinic with any questions, comments, or concerns.  Maximiano Coss, NP

## 2021-02-15 ENCOUNTER — Other Ambulatory Visit: Payer: Self-pay | Admitting: Hematology and Oncology

## 2021-02-15 ENCOUNTER — Telehealth: Payer: Self-pay

## 2021-02-15 DIAGNOSIS — D539 Nutritional anemia, unspecified: Secondary | ICD-10-CM

## 2021-02-15 NOTE — Telephone Encounter (Signed)
Called and given below message. She verbalized understanding. She would like to move appts out 3 months Appts moved to 1025. She is aware of appt times.

## 2021-02-15 NOTE — Telephone Encounter (Signed)
-----   Message from Heath Lark, MD sent at 02/15/2021  7:57 AM EDT ----- Looks like she just had labs in June, looks good, unlikely to change We can cancel her labs and switch her to virtual visit OR move everything 3 months further Please call and ask

## 2021-02-16 ENCOUNTER — Inpatient Hospital Stay: Payer: BC Managed Care – PPO

## 2021-02-16 ENCOUNTER — Inpatient Hospital Stay: Payer: BC Managed Care – PPO | Admitting: Hematology and Oncology

## 2021-03-11 DIAGNOSIS — M19041 Primary osteoarthritis, right hand: Secondary | ICD-10-CM | POA: Diagnosis not present

## 2021-03-11 DIAGNOSIS — I7300 Raynaud's syndrome without gangrene: Secondary | ICD-10-CM | POA: Diagnosis not present

## 2021-03-11 DIAGNOSIS — R768 Other specified abnormal immunological findings in serum: Secondary | ICD-10-CM | POA: Diagnosis not present

## 2021-03-11 DIAGNOSIS — M1990 Unspecified osteoarthritis, unspecified site: Secondary | ICD-10-CM | POA: Diagnosis not present

## 2021-03-27 DIAGNOSIS — Z720 Tobacco use: Secondary | ICD-10-CM | POA: Diagnosis not present

## 2021-05-18 ENCOUNTER — Inpatient Hospital Stay: Payer: BC Managed Care – PPO | Admitting: Hematology and Oncology

## 2021-05-18 ENCOUNTER — Inpatient Hospital Stay: Payer: BC Managed Care – PPO

## 2021-06-14 DIAGNOSIS — M1990 Unspecified osteoarthritis, unspecified site: Secondary | ICD-10-CM | POA: Diagnosis not present

## 2021-06-18 ENCOUNTER — Other Ambulatory Visit: Payer: Self-pay | Admitting: Registered Nurse

## 2021-06-18 DIAGNOSIS — I10 Essential (primary) hypertension: Secondary | ICD-10-CM

## 2021-06-18 DIAGNOSIS — Z716 Tobacco abuse counseling: Secondary | ICD-10-CM

## 2021-06-23 ENCOUNTER — Ambulatory Visit: Payer: BC Managed Care – PPO | Admitting: Registered Nurse

## 2021-06-30 ENCOUNTER — Ambulatory Visit: Payer: BC Managed Care – PPO | Admitting: Registered Nurse

## 2021-06-30 ENCOUNTER — Encounter: Payer: Self-pay | Admitting: Registered Nurse

## 2021-06-30 VITALS — BP 136/68 | HR 90 | Temp 98.1°F | Resp 17 | Ht 62.0 in | Wt 118.6 lb

## 2021-06-30 DIAGNOSIS — Z23 Encounter for immunization: Secondary | ICD-10-CM | POA: Diagnosis not present

## 2021-06-30 DIAGNOSIS — E119 Type 2 diabetes mellitus without complications: Secondary | ICD-10-CM | POA: Diagnosis not present

## 2021-06-30 DIAGNOSIS — R7989 Other specified abnormal findings of blood chemistry: Secondary | ICD-10-CM | POA: Diagnosis not present

## 2021-06-30 DIAGNOSIS — I10 Essential (primary) hypertension: Secondary | ICD-10-CM

## 2021-06-30 DIAGNOSIS — Z716 Tobacco abuse counseling: Secondary | ICD-10-CM

## 2021-06-30 DIAGNOSIS — Z1231 Encounter for screening mammogram for malignant neoplasm of breast: Secondary | ICD-10-CM

## 2021-06-30 MED ORDER — BUPROPION HCL ER (SR) 150 MG PO TB12: 150.0000 mg | ORAL_TABLET | Freq: Two times a day (BID) | ORAL | 1 refills | Status: DC

## 2021-06-30 MED ORDER — AMLODIPINE BESY-BENAZEPRIL HCL 10-20 MG PO CAPS
1.0000 | ORAL_CAPSULE | Freq: Every day | ORAL | 1 refills | Status: DC
Start: 2021-06-30 — End: 2022-01-06

## 2021-06-30 NOTE — Progress Notes (Signed)
Established Patient Office Visit  Subjective:  Patient ID: Alyssa Rhodes, female    DOB: 22-Nov-1960  Age: 60 y.o. MRN: [id number]  CC:  Chief Complaint  Patient presents with   Nicotine Dependence    Pt reports had trouble for a little while but is now down to ~7 cigarettes daily    Diabetes    Pt reports doing okay no concerns    Hypertension    Pt due for recheck no concerns     HPI Alyssa Rhodes presents for smoking cessation, t2dm, htn  Hypertension: Patient Currently taking: amlodipine-benazepril 10-20mg  po qd Good effect. No AEs. Denies CV symptoms including: chest pain, shob, doe, headache, visual changes, fatigue, claudication, and dependent edema.   Previous readings and labs: BP Readings from Last 3 Encounters:  06/30/21 140/74  12/23/20 139/65  06/24/20 (!) 159/89   Lab Results  Component Value Date   CREATININE 0.48 12/23/2020   T2dm Last A1c:  Lab Results  Component Value Date   HGBA1C 6.0 (A) 12/23/2020    Currently taking: no medications, managed by lifestyle No new complications Reports good compliance with medications Diet has been steady, good Exercise habits have been steady  Smoking cessation Down to around 7 cigarettes per day Tolerating wellbutrin well Continues to work to quit.  Abnormal labs TSH low to 0.29 - asymptomatic Wants to recheck.  Past Medical History:  Diagnosis Date   Diabetes mellitus without complication ([place])    Hyperlipidemia    Hypertension    Lupus ([place])    Thyroid nodule 03/2010   aspiration    Past Surgical History:  Procedure Laterality Date   ABDOMINAL HYSTERECTOMY     CESAREAN SECTION     SHOULDER SURGERY  2011   LEFT    Family History  Problem Relation Age of Onset   Asthma Mother        doe not know mother well, sister advised her   Diabetes Mother    Heart disease Father        heart attack   Hypertension Father     Social History   Socioeconomic History   Marital status: Widowed     Spouse name: Not on file   Number of children: 2   Years of education: Not on file   Highest education level: Not on file  Occupational History   Occupation: accountant  Tobacco Use   Smoking status: Every Day    Packs/day: 1.00    Years: 41.00    Pack years: 41.00    Types: Cigarettes   Smokeless tobacco: Never  Vaping Use   Vaping Use: Never used  Substance and Sexual Activity   Alcohol use: Yes    Alcohol/week: 3.0 standard drinks    Types: 3 Standard drinks or equivalent per week    Comment: 3/week   Drug use: No   Sexual activity: Not Currently  Other Topics Concern   Not on file  Social History Narrative   Not on file   Social Determinants of Health   Financial Resource Strain: Not on file  Food Insecurity: Not on file  Transportation Needs: Not on file  Physical Activity: Not on file  Stress: Not on file  Social Connections: Not on file  Intimate Partner Violence: Not on file    Outpatient Medications Prior to Visit  Medication Sig Dispense Refill   amLODipine (NORVASC) 5 MG tablet Take 5 mg by mouth daily.     folic acid (FOLVITE) 1  MG tablet TAKE 1 TABLET BY MOUTH ONCE DAILY FOR 30 DAYS     methotrexate 2.5 MG tablet 6 tabs a week reported 06/30/21     Multiple Vitamin (MULTIVITAMIN) tablet Take 1 tablet by mouth daily.     Omega-3 Fatty Acids (FISH OIL PO) Take by mouth daily.     amLODipine-benazepril (LOTREL) 10-20 MG capsule Take 1 capsule by mouth once daily 30 capsule 0   buPROPion (WELLBUTRIN SR) 150 MG 12 hr tablet Take 1 tablet by mouth twice daily 60 tablet 0   aspirin EC 81 MG tablet Take 81 mg by mouth daily. (Patient not taking: Reported on 06/24/2020)     No facility-administered medications prior to visit.    Allergies  Allergen Reactions   Codeine Nausea And Vomiting    ROS Review of Systems  Constitutional: Negative.   HENT: Negative.    Eyes: Negative.   Respiratory: Negative.    Cardiovascular: Negative.   Gastrointestinal:  Negative.   Genitourinary: Negative.   Musculoskeletal: Negative.   Skin: Negative.   Neurological: Negative.   Psychiatric/Behavioral: Negative.    All other systems reviewed and are negative.    Objective:    Physical Exam Vitals and nursing note reviewed.  Constitutional:      General: She is not in acute distress.    Appearance: Normal appearance. She is normal weight. She is not ill-appearing, toxic-appearing or diaphoretic.  Cardiovascular:     Rate and Rhythm: Normal rate and regular rhythm.     Heart sounds: Normal heart sounds. No murmur heard.   No friction rub. No gallop.  Pulmonary:     Effort: Pulmonary effort is normal. No respiratory distress.     Breath sounds: Normal breath sounds. No stridor. No wheezing, rhonchi or rales.  Chest:     Chest wall: No tenderness.  Skin:    General: Skin is warm and dry.  Neurological:     General: No focal deficit present.     Mental Status: She is alert and oriented to person, place, and time. Mental status is at baseline.  Psychiatric:        Mood and Affect: Mood normal.        Behavior: Behavior normal.        Thought Content: Thought content normal.        Judgment: Judgment normal.    BP 140/74   Pulse 90   Temp 98.1 F (36.7 C) (Temporal)   Resp 17   Ht 5\' 2"  (1.575 m)   Wt 118 lb 9.6 oz (53.8 kg)   SpO2 95%   BMI 21.69 kg/m  Wt Readings from Last 3 Encounters:  06/30/21 118 lb 9.6 oz (53.8 kg)  12/23/20 113 lb 12.8 oz (51.6 kg)  06/24/20 118 lb (53.5 kg)     Health Maintenance Due  Topic Date Due   Pneumococcal Vaccine 6-37 Years old (1 - PCV) Never done   PAP SMEAR-Modifier  Never done   MAMMOGRAM  Never done   Zoster Vaccines- Shingrix (1 of 2) Never done   COVID-19 Vaccine (4 - Booster for Pfizer series) 08/29/2020   INFLUENZA VACCINE  02/22/2021    There are no preventive care reminders to display for this patient.  Lab Results  Component Value Date   TSH 0.29 (L) 12/23/2020   Lab  Results  Component Value Date   WBC 10.0 12/23/2020   HGB 16.8 (H) 12/23/2020   HCT 47.8 (H) 12/23/2020   MCV 103.8 (  H) 12/23/2020   PLT 437.0 (H) 12/23/2020   Lab Results  Component Value Date   NA 138 12/23/2020   K 4.0 12/23/2020   CO2 30 12/23/2020   GLUCOSE 136 (H) 12/23/2020   BUN 10 12/23/2020   CREATININE 0.48 12/23/2020   BILITOT 0.6 12/23/2020   ALKPHOS 82 12/23/2020   AST 24 12/23/2020   ALT 26 12/23/2020   PROT 7.1 12/23/2020   ALBUMIN 4.3 12/23/2020   CALCIUM 9.7 12/23/2020   ANIONGAP 10 12/17/2019   GFR 103.28 12/23/2020   Lab Results  Component Value Date   CHOL 147 12/23/2020   Lab Results  Component Value Date   HDL 35.70 (L) 12/23/2020   Lab Results  Component Value Date   LDLCALC 81 12/23/2020   Lab Results  Component Value Date   TRIG 155.0 (H) 12/23/2020   Lab Results  Component Value Date   CHOLHDL 4 12/23/2020   Lab Results  Component Value Date   HGBA1C 6.0 (A) 12/23/2020      Assessment & Plan:   Problem List Items Addressed This Visit   None Visit Diagnoses     Need for influenza vaccination    -  Primary   Relevant Orders   Flu Vaccine QUAD 6+ mos PF IM (Fluarix Quad PF)   Screening mammogram for breast cancer       Relevant Orders   MM Digital Screening   Essential hypertension       Relevant Medications   amLODipine (NORVASC) 5 MG tablet   amLODipine-benazepril (LOTREL) 10-20 MG capsule   Other Relevant Orders   CBC with Differential/Platelet   Comprehensive metabolic panel   Lipid panel   Encounter for smoking cessation counseling       Relevant Medications   buPROPion (WELLBUTRIN SR) 150 MG 12 hr tablet   Low TSH level       Relevant Orders   TSH   T4, free   Well controlled type 2 diabetes mellitus ([place])       Relevant Medications   amLODipine-benazepril (LOTREL) 10-20 MG capsule   Other Relevant Orders   Hemoglobin A1c   Lipid panel       Meds ordered this encounter  Medications   buPROPion  (WELLBUTRIN SR) 150 MG 12 hr tablet    Sig: Take 1 tablet (150 mg total) by mouth 2 (two) times daily.    Dispense:  180 tablet    Refill:  1    Order Specific Question:   Supervising Provider    Answer:   Carlota Raspberry, JEFFREY R [[id number]]   amLODipine-benazepril (LOTREL) 10-20 MG capsule    Sig: Take 1 capsule by mouth daily.    Dispense:  90 capsule    Refill:  1    Order Specific Question:   Supervising Provider    Answer:   Carlota Raspberry, JEFFREY R [[id number]]    Follow-up: Return in about 6 months (around 12/29/2021) for CPE .   PLAN Htn - well controlled - continue current regimen T2dm - well controlled - continue lifestyle management Smoking cessation - good progress - continue current regimen Discussed risks of low TSH / hyperthyroidisim. No symptoms. Will recheck TSH and add t4. Follow up as warranted. Refill antihypertensives and wellbutrin x 6 mo Patient encouraged to call clinic with any questions, comments, or concerns.  Maximiano Coss, NP

## 2021-06-30 NOTE — Patient Instructions (Signed)
Alyssa Rhodes -   Always a pleasure to see you  Let's see how labs look. I'll reach out with any concerns.  See you in 6 months, sooner if you need anything  Thank you  Denice Paradise

## 2021-07-02 ENCOUNTER — Other Ambulatory Visit: Payer: BC Managed Care – PPO

## 2021-07-02 LAB — CBC WITH DIFFERENTIAL/PLATELET
Basophils Absolute: 0.1 10*3/uL (ref 0.0–0.1)
Basophils Relative: 1.2 % (ref 0.0–3.0)
Eosinophils Absolute: 0.3 10*3/uL (ref 0.0–0.7)
Eosinophils Relative: 4.9 % (ref 0.0–5.0)
HCT: 47.6 % — ABNORMAL HIGH (ref 36.0–46.0)
Hemoglobin: 16.1 g/dL — ABNORMAL HIGH (ref 12.0–15.0)
Lymphocytes Relative: 17.4 % (ref 12.0–46.0)
Lymphs Abs: 1 10*3/uL (ref 0.7–4.0)
MCHC: 33.8 g/dL (ref 30.0–36.0)
MCV: 100.7 fl — ABNORMAL HIGH (ref 78.0–100.0)
Monocytes Absolute: 0.6 10*3/uL (ref 0.1–1.0)
Monocytes Relative: 9.9 % (ref 3.0–12.0)
Neutro Abs: 3.8 10*3/uL (ref 1.4–7.7)
Neutrophils Relative %: 66.6 % (ref 43.0–77.0)
Platelets: 438 10*3/uL — ABNORMAL HIGH (ref 150.0–400.0)
RBC: 4.72 Mil/uL (ref 3.87–5.11)
RDW: 15.9 % — ABNORMAL HIGH (ref 11.5–15.5)
WBC: 5.7 10*3/uL (ref 4.0–10.5)

## 2021-07-02 LAB — TSH: TSH: 0.63 u[IU]/mL (ref 0.35–5.50)

## 2021-07-02 LAB — COMPREHENSIVE METABOLIC PANEL
ALT: 20 U/L (ref 0–35)
AST: 17 U/L (ref 0–37)
Albumin: 4.4 g/dL (ref 3.5–5.2)
Alkaline Phosphatase: 67 U/L (ref 39–117)
BUN: 17 mg/dL (ref 6–23)
CO2: 26 mEq/L (ref 19–32)
Calcium: 9.5 mg/dL (ref 8.4–10.5)
Chloride: 102 mEq/L (ref 96–112)
Creatinine, Ser: 0.66 mg/dL (ref 0.40–1.20)
GFR: 95.3 mL/min (ref >60.00)
Glucose, Bld: 141 mg/dL — ABNORMAL HIGH (ref 70–99)
Potassium: 3.8 mEq/L (ref 3.5–5.1)
Sodium: 138 mEq/L (ref 135–145)
Total Bilirubin: 0.6 mg/dL (ref 0.2–1.2)
Total Protein: 7.5 g/dL (ref 6.0–8.3)

## 2021-07-02 LAB — LIPID PANEL
Cholesterol: 152 mg/dL (ref 0–200)
HDL: 47.2 mg/dL (ref >39.00)
LDL Cholesterol: 71 mg/dL (ref 0–99)
NonHDL: 104.76
Total CHOL/HDL Ratio: 3
Triglycerides: 170 mg/dL — ABNORMAL HIGH (ref 0.0–149.0)
VLDL: 34 mg/dL (ref 0.0–40.0)

## 2021-07-02 LAB — HEMOGLOBIN A1C: Hgb A1c MFr Bld: 6.4 % (ref 4.6–6.5)

## 2021-07-02 LAB — T4, FREE: Free T4: 0.83 ng/dL (ref 0.60–1.60)

## 2021-08-18 ENCOUNTER — Ambulatory Visit
Admission: RE | Admit: 2021-08-18 | Discharge: 2021-08-18 | Disposition: A | Discharge status: Home / Self Care | Payer: BC Managed Care – PPO | Source: Ambulatory Visit | Attending: Registered Nurse | Admitting: Registered Nurse

## 2021-08-18 DIAGNOSIS — Z1231 Encounter for screening mammogram for malignant neoplasm of breast: Secondary | ICD-10-CM | POA: Diagnosis not present

## 2021-09-11 DIAGNOSIS — Z720 Tobacco use: Secondary | ICD-10-CM | POA: Diagnosis not present

## 2021-09-14 DIAGNOSIS — Z720 Tobacco use: Secondary | ICD-10-CM | POA: Diagnosis not present

## 2021-09-15 DIAGNOSIS — M19041 Primary osteoarthritis, right hand: Secondary | ICD-10-CM | POA: Diagnosis not present

## 2021-09-15 DIAGNOSIS — R768 Other specified abnormal immunological findings in serum: Secondary | ICD-10-CM | POA: Diagnosis not present

## 2021-09-15 DIAGNOSIS — I7300 Raynaud's syndrome without gangrene: Secondary | ICD-10-CM | POA: Diagnosis not present

## 2021-09-15 DIAGNOSIS — M0579 Rheumatoid arthritis with rheumatoid factor of multiple sites without organ or systems involvement: Secondary | ICD-10-CM | POA: Diagnosis not present

## 2021-12-13 DIAGNOSIS — M0579 Rheumatoid arthritis with rheumatoid factor of multiple sites without organ or systems involvement: Secondary | ICD-10-CM | POA: Diagnosis not present

## 2021-12-13 DIAGNOSIS — Z79899 Other long term (current) drug therapy: Secondary | ICD-10-CM | POA: Diagnosis not present

## 2021-12-14 ENCOUNTER — Emergency Department (HOSPITAL_COMMUNITY)
Admission: EM | Admit: 2021-12-14 | Discharge: 2021-12-14 | Disposition: A | Discharge status: Home / Self Care | Payer: BC Managed Care – PPO | Attending: Emergency Medicine | Admitting: Emergency Medicine

## 2021-12-14 ENCOUNTER — Other Ambulatory Visit: Payer: Self-pay

## 2021-12-14 DIAGNOSIS — I10 Essential (primary) hypertension: Secondary | ICD-10-CM | POA: Diagnosis not present

## 2021-12-14 DIAGNOSIS — R5383 Other fatigue: Secondary | ICD-10-CM | POA: Insufficient documentation

## 2021-12-14 DIAGNOSIS — Z7984 Long term (current) use of oral hypoglycemic drugs: Secondary | ICD-10-CM | POA: Diagnosis not present

## 2021-12-14 DIAGNOSIS — R739 Hyperglycemia, unspecified: Secondary | ICD-10-CM | POA: Diagnosis not present

## 2021-12-14 DIAGNOSIS — E1165 Type 2 diabetes mellitus with hyperglycemia: Secondary | ICD-10-CM | POA: Diagnosis not present

## 2021-12-14 DIAGNOSIS — Z79899 Other long term (current) drug therapy: Secondary | ICD-10-CM | POA: Diagnosis not present

## 2021-12-14 LAB — URINALYSIS, ROUTINE W REFLEX MICROSCOPIC
Bilirubin Urine: NEGATIVE
Glucose, UA: >=500 mg/dL — AB
Hgb urine dipstick: NEGATIVE
Ketones, ur: NEGATIVE mg/dL
Nitrite: NEGATIVE
Protein, ur: NEGATIVE mg/dL
Specific Gravity, Urine: 1.024 (ref 1.005–1.030)
pH: 5 (ref 5.0–8.0)

## 2021-12-14 LAB — CBC
HCT: 44.7 % (ref 36.0–46.0)
Hemoglobin: 15.6 g/dL — ABNORMAL HIGH (ref 12.0–15.0)
MCH: 31.6 pg (ref 26.0–34.0)
MCHC: 34.9 g/dL (ref 30.0–36.0)
MCV: 90.7 fL (ref 80.0–100.0)
Platelets: 461 10*3/uL — ABNORMAL HIGH (ref 150–400)
RBC: 4.93 MIL/uL (ref 3.87–5.11)
RDW: 12.8 % (ref 11.5–15.5)
WBC: 5.9 10*3/uL (ref 4.0–10.5)
nRBC: 0 % (ref 0.0–0.2)

## 2021-12-14 LAB — I-STAT VENOUS BLOOD GAS, ED
Acid-Base Excess: 3 mmol/L — ABNORMAL HIGH (ref 0.0–2.0)
Bicarbonate: 28.4 mmol/L — ABNORMAL HIGH (ref 20.0–28.0)
Calcium, Ion: 1.19 mmol/L (ref 1.15–1.40)
HCT: 48 % — ABNORMAL HIGH (ref 36.0–46.0)
Hemoglobin: 16.3 g/dL — ABNORMAL HIGH (ref 12.0–15.0)
O2 Saturation: 84 %
Potassium: 3.8 mmol/L (ref 3.5–5.1)
Sodium: 131 mmol/L — ABNORMAL LOW (ref 135–145)
TCO2: 30 mmol/L (ref 22–32)
pCO2, Ven: 47 mmHg (ref 44–60)
pH, Ven: 7.39 (ref 7.25–7.43)
pO2, Ven: 49 mmHg — ABNORMAL HIGH (ref 32–45)

## 2021-12-14 LAB — COMPREHENSIVE METABOLIC PANEL
ALT: 38 U/L (ref 0–44)
AST: 31 U/L (ref 15–41)
Albumin: 4.1 g/dL (ref 3.5–5.0)
Alkaline Phosphatase: 99 U/L (ref 38–126)
Anion gap: 12 (ref 5–15)
BUN: 8 mg/dL (ref 6–20)
CO2: 25 mmol/L (ref 22–32)
Calcium: 9.7 mg/dL (ref 8.9–10.3)
Chloride: 93 mmol/L — ABNORMAL LOW (ref 98–111)
Creatinine, Ser: 0.8 mg/dL (ref 0.44–1.00)
GFR, Estimated: >60 mL/min (ref >60)
Glucose, Bld: 563 mg/dL (ref 70–99)
Potassium: 3.9 mmol/L (ref 3.5–5.1)
Sodium: 130 mmol/L — ABNORMAL LOW (ref 135–145)
Total Bilirubin: 0.5 mg/dL (ref 0.3–1.2)
Total Protein: 7.4 g/dL (ref 6.5–8.1)

## 2021-12-14 LAB — CBG MONITORING, ED
Glucose-Capillary: 594 mg/dL (ref 70–99)
Glucose-Capillary: 254 mg/dL — ABNORMAL HIGH (ref 70–99)

## 2021-12-14 MED ORDER — METFORMIN HCL 500 MG PO TABS: 500.0000 mg | ORAL_TABLET | Freq: Two times a day (BID) | ORAL | 0 refills | Status: DC

## 2021-12-14 MED ORDER — SODIUM CHLORIDE 0.9 % IV BOLUS
1000.0000 mL | Freq: Once | INTRAVENOUS | Status: AC
  Administered 2021-12-14: 1000 mL via INTRAVENOUS

## 2021-12-14 MED ORDER — LIVING WELL WITH DIABETES BOOK
Freq: Once | Status: AC
  Filled 2021-12-14: qty 1

## 2021-12-14 MED ORDER — INSULIN ASPART 100 UNIT/ML IJ SOLN
10.0000 [IU] | Freq: Once | INTRAMUSCULAR | Status: AC
  Administered 2021-12-14: 10 [IU] via INTRAVENOUS

## 2021-12-14 NOTE — ED Provider Triage Note (Signed)
Emergency Medicine Provider Triage Evaluation Note  Alyssa Rhodes , a 61 y.o. female  was evaluated in triage.  Pt complains of hyperglycemia.  Was hyperglycemic outpatient yesterday in the 500s and again today.  Does not check her sugar at home.  Denies being on any medications.  Says she has no history of diabetes also had gestational diabetes.  Endorsing polyuria, polydipsia, nausea, lightheadedness  Review of Systems  Positive: As above Negative: Vomiting, syncope  Physical Exam  BP (!) 183/89 (BP Location: Left Arm)   Pulse (!) 105   Temp 98.6 F (37 C) (Oral)   Resp 17   Ht '5\' 2"'$  (1.575 m)   Wt 56.7 kg   SpO2 96%   BMI 22.86 kg/m  Gen:   Awake, no distress   Resp:  Normal effort  MSK:   Moves extremities without difficulty  Other:  Well-appearing, not diaphoretic  Medical Decision Making  Medically screening exam initiated at 9:43 AM.  Appropriate orders placed.  Alyssa Rhodes was informed that the remainder of the evaluation will be completed by another provider, this initial triage assessment does not replace that evaluation, and the importance of remaining in the ED until their evaluation is complete.    Patient says she does not have diabetes however it is listed in the chart.   Alyssa Rhodes, [place] 12/14/21 [phone]

## 2021-12-14 NOTE — ED Notes (Signed)
Got patient into a gown on the monitor patient is resting with call bell in reach 

## 2021-12-14 NOTE — Progress Notes (Addendum)
Inpatient Diabetes Program Recommendations  AACE/ADA: New Consensus Statement on Inpatient Glycemic Control (2015)  Target Ranges:  Prepandial:   less than 140 mg/dL      Peak postprandial:   less than 180 mg/dL (1-2 hours)      Critically ill patients:  140 - 180 mg/dL   Lab Results  Component Value Date   GLUCAP 594 (HH) 12/14/2021   HGBA1C 6.4 06/30/2021    Review of Glycemic Control  Latest Reference Range & Units 12/14/21 09:29  Glucose-Capillary 70 - 99 mg/dL 594 (HH)   Diabetes history: DM 2 Outpatient Diabetes medications: None Pt. States that she takes Prednisone PRN  Inpatient Diabetes Program Recommendations:    Spoke with pt about diabetes and elevated blood sugar.  Explained what an A1C is, basic pathophysiology of DM Type 2, basic home care, importance of checking CBGs and maintaining good CBG control to prevent long-term and short-term complications.  Reviewed signs and symptoms of hyperglycemia and hypoglycemia.  Have ordered educational booklet and added further instructions to "D/C education".  Patient states that she does not have a meter and will need one ordered.  She has been on metformin in the past however this was stopped due to improving blood sugars.  Last A1C was 6.4% in December of 2022.  -Please consider re-ordering A1C for 2-3 month average of blood sugars and please order Glucose meter for home use.   She recently had teeth removed and has been eating mostly potatoes.  We discussed other soft food options such as cooked non-starchy veggies, eggs and yogurts.  We also discussed the importance of sugar free beverages.  Encouraged her to f/u with PCP this week if possible for further management of DM.  Also discussed normal glucose values and monitoring at home (at least bid).  She had a dog with DM in the past, so is familiar with insulin administration with her pet.  Patient verbalized understanding.   Thanks,  Adah Perl, RN, BC-ADM Inpatient Diabetes  Coordinator Pager [phone]   (8a-5p)

## 2021-12-14 NOTE — Discharge Instructions (Signed)
As discussed, with today's episode of hyperglycemia or elevated blood sugar it is very importantly follow-up with your physician as previously scheduled in 2 weeks.  Please obtain and take medication as prescribed.  Return here for concerning changes in your condition.

## 2021-12-14 NOTE — ED Triage Notes (Signed)
Pt. Stated, I ve been more tired than usual and I had blood drawn from my rheumatologist and they said my glucose was high.said it was 597

## 2021-12-14 NOTE — ED Notes (Signed)
RN reviewed discharge instructions w/ pt. Follow up and prescriptions reviewed. Pt had no further questions.

## 2021-12-14 NOTE — ED Provider Notes (Signed)
[facility] EMERGENCY DEPARTMENT Provider Note   CSN: [id number] Arrival date & time: 12/14/21  8295     History  Chief Complaint  Patient presents with   Hyperglycemia   Fatigue    Alyssa Rhodes is a 61 y.o. female.  HPI Patient with a history of non-insulin-dependent diabetes, not currently medication presents with concern for hyperglycemia, generalized fatigue and being" swimmy headed". No notable precipitant, and no obvious onset for her illness.  She notes that a long time ago she was on medication for diabetes, but was reportedly taken off of this by her physician.  She also has a history of rheumatoid or disease, though is not currently on steroids.  No specific pain, nausea, vomiting, diarrhea.  Today, blood sugar was checked, found to be greater than 500 and she was sent here for evaluation.    Home Medications Prior to Admission medications   Medication Sig Start Date End Date Taking? Authorizing Provider  metFORMIN (GLUCOPHAGE) 500 MG tablet Take 1 tablet (500 mg total) by mouth 2 (two) times daily with a meal. 12/14/21  Yes Carmin Muskrat, MD  amLODipine (NORVASC) 5 MG tablet Take 5 mg by mouth daily. 06/25/21   [provider]  amLODipine-benazepril (LOTREL) 10-20 MG capsule Take 1 capsule by mouth daily. 06/30/21   Maximiano Coss, NP  buPROPion [facility] SR) 150 MG 12 hr tablet Take 1 tablet (150 mg total) by mouth 2 (two) times daily. 06/30/21   Maximiano Coss, NP  folic acid (FOLVITE) 1 MG tablet TAKE 1 TABLET BY MOUTH ONCE DAILY FOR 30 DAYS 02/27/19   [provider]  methotrexate 2.5 MG tablet 6 tabs a week reported 06/30/21 02/27/19   [provider]  Multiple Vitamin (MULTIVITAMIN) tablet Take 1 tablet by mouth daily.    [provider]  Omega-3 Fatty Acids (FISH OIL PO) Take by mouth daily.    [provider]      Allergies    Codeine    Review of Systems   Review of Systems  All other systems  reviewed and are negative.  Physical Exam Updated Vital Signs BP (!) 156/77   Pulse 91   Temp 98.6 F (37 C) (Oral)   Resp 15   Ht '5\' 2"'$  (1.575 m)   Wt 56.7 kg   SpO2 97%   BMI 22.86 kg/m  Physical Exam Vitals and nursing note reviewed.  Constitutional:      General: She is not in acute distress.    Appearance: She is well-developed.  HENT:     Head: Normocephalic and atraumatic.  Eyes:     Conjunctiva/sclera: Conjunctivae normal.  Cardiovascular:     Rate and Rhythm: Normal rate and regular rhythm.  Pulmonary:     Effort: Pulmonary effort is normal. No respiratory distress.     Breath sounds: Normal breath sounds. No stridor.  Abdominal:     General: There is no distension.  Skin:    General: Skin is warm and dry.  Neurological:     Mental Status: She is alert and oriented to person, place, and time.     Cranial Nerves: No cranial nerve deficit.  Psychiatric:        Mood and Affect: Mood normal.    ED Results / Procedures / Treatments   Labs (all labs ordered are listed, but only abnormal results are displayed) Labs Reviewed  CBC - Abnormal; Notable for the following components:      Result Value  Hemoglobin 15.6 (*)    Platelets 461 (*)    All other components within normal limits  URINALYSIS, ROUTINE W REFLEX MICROSCOPIC - Abnormal; Notable for the following components:   Color, Urine STRAW (*)    Glucose, UA >=500 (*)    Leukocytes,Ua TRACE (*)    Bacteria, UA RARE (*)    All other components within normal limits  COMPREHENSIVE METABOLIC PANEL - Abnormal; Notable for the following components:   Sodium 130 (*)    Chloride 93 (*)    Glucose, Bld 563 (*)    All other components within normal limits  CBG MONITORING, ED - Abnormal; Notable for the following components:   Glucose-Capillary 594 (*)    All other components within normal limits  I-STAT VENOUS BLOOD GAS, ED - Abnormal; Notable for the following components:   pO2, Ven 49 (*)    Bicarbonate  28.4 (*)    Acid-Base Excess 3.0 (*)    Sodium 131 (*)    HCT 48.0 (*)    Hemoglobin 16.3 (*)    All other components within normal limits  CBG MONITORING, ED - Abnormal; Notable for the following components:   Glucose-Capillary 254 (*)    All other components within normal limits    EKG None  Radiology No results found.  Procedures Procedures    Medications Ordered in ED Medications  living well with diabetes book MISC (has no administration in time range)  sodium chloride 0.9 % bolus 1,000 mL (1,000 mLs Intravenous New Bag/Given 12/14/21 1224)  insulin aspart (novoLOG) injection 10 Units (10 Units Intravenous Given 12/14/21 1224)    ED Course/ Medical Decision Making/ A&P This patient with a Hx of rheumatoid disease, non-insulin-dependent diabetes presents to the ED for concern of fatigue, unsettled sensation, this involves an extensive number of treatment options, and is a complaint that carries with it a high risk of complications and morbidity.    The differential diagnosis includes DKA, nonketotic hyperosmolar state, infection, dehydration   Social Determinants of Health:  No limits  Additional history obtained:  Additional history and/or information obtained from chart review, notable for multiple prior evaluations for rheumatologic disease, ongoing addressing of smoking   After the initial evaluation, orders, including: Labs fluids insulin were initiated.   Patient placed on Cardiac and Pulse-Oximetry Monitors. The patient was maintained on a cardiac monitor.  The cardiac monitored showed an rhythm of 90 sinus normal  The patient was also maintained on pulse oximetry. The readings were typically 100% room air normal   On repeat evaluation of the patient improved  Lab Tests:  I personally interpreted labs.  The pertinent results include: Initial glucose greater than 500, repeat after fluids, insulin less than 300, patient has no evidence for  DKA   Dispostion / Final MDM:  After consideration of the diagnostic results and the patient's response to treatment, with improvement in hyperglycemia, no evidence for DKA, nonketotic hyperosmolar state and subjective improvement no hemodynamic instability the patient is appropriate to reinitiate antihyperglycemic regimen, follow-up with outpatient clinicians.  Final Clinical Impression(s) / ED Diagnoses Final diagnoses:  Hyperglycemia    Rx / DC Orders ED Discharge Orders          Ordered    metFORMIN (GLUCOPHAGE) 500 MG tablet  2 times daily with meals        12/14/21 1401              Carmin Muskrat, MD 12/14/21 1401

## 2021-12-29 ENCOUNTER — Other Ambulatory Visit: Payer: Self-pay

## 2021-12-29 ENCOUNTER — Ambulatory Visit (INDEPENDENT_AMBULATORY_CARE_PROVIDER_SITE_OTHER): Payer: BC Managed Care – PPO | Admitting: Registered Nurse

## 2021-12-29 ENCOUNTER — Encounter: Payer: Self-pay | Admitting: Registered Nurse

## 2021-12-29 VITALS — BP 160/90 | HR 66 | Temp 98.3°F | Resp 18 | Ht 62.0 in | Wt 129.0 lb

## 2021-12-29 DIAGNOSIS — R9431 Abnormal electrocardiogram [ECG] [EKG]: Secondary | ICD-10-CM

## 2021-12-29 DIAGNOSIS — I10 Essential (primary) hypertension: Secondary | ICD-10-CM

## 2021-12-29 DIAGNOSIS — E1165 Type 2 diabetes mellitus with hyperglycemia: Secondary | ICD-10-CM

## 2021-12-29 DIAGNOSIS — Z0000 Encounter for general adult medical examination without abnormal findings: Secondary | ICD-10-CM

## 2021-12-29 DIAGNOSIS — Z1211 Encounter for screening for malignant neoplasm of colon: Secondary | ICD-10-CM

## 2021-12-29 DIAGNOSIS — N950 Postmenopausal bleeding: Secondary | ICD-10-CM | POA: Diagnosis not present

## 2021-12-29 DIAGNOSIS — R609 Edema, unspecified: Secondary | ICD-10-CM | POA: Diagnosis not present

## 2021-12-29 LAB — CBC WITH DIFFERENTIAL/PLATELET
Basophils Absolute: 0 10*3/uL (ref 0.0–0.1)
Basophils Relative: 0.7 % (ref 0.0–3.0)
Eosinophils Absolute: 0.4 10*3/uL (ref 0.0–0.7)
Eosinophils Relative: 6.7 % — ABNORMAL HIGH (ref 0.0–5.0)
HCT: 45 % (ref 36.0–46.0)
Hemoglobin: 15.1 g/dL — ABNORMAL HIGH (ref 12.0–15.0)
Lymphocytes Relative: 14.7 % (ref 12.0–46.0)
Lymphs Abs: 1 10*3/uL (ref 0.7–4.0)
MCHC: 33.6 g/dL (ref 30.0–36.0)
MCV: 94 fl (ref 78.0–100.0)
Monocytes Absolute: 0.5 10*3/uL (ref 0.1–1.0)
Monocytes Relative: 8 % (ref 3.0–12.0)
Neutro Abs: 4.6 10*3/uL (ref 1.4–7.7)
Neutrophils Relative %: 69.9 % (ref 43.0–77.0)
Platelets: 528 10*3/uL — ABNORMAL HIGH (ref 150.0–400.0)
RBC: 4.79 Mil/uL (ref 3.87–5.11)
RDW: 13.6 % (ref 11.5–15.5)
WBC: 6.6 10*3/uL (ref 4.0–10.5)

## 2021-12-29 LAB — COMPREHENSIVE METABOLIC PANEL
ALT: 30 U/L (ref 0–35)
AST: 23 U/L (ref 0–37)
Albumin: 4.1 g/dL (ref 3.5–5.2)
Alkaline Phosphatase: 67 U/L (ref 39–117)
BUN: 10 mg/dL (ref 6–23)
CO2: 27 mEq/L (ref 19–32)
Calcium: 10.2 mg/dL (ref 8.4–10.5)
Chloride: 98 mEq/L (ref 96–112)
Creatinine, Ser: 0.68 mg/dL (ref 0.40–1.20)
GFR: 94.29 mL/min (ref >60.00)
Glucose, Bld: 227 mg/dL — ABNORMAL HIGH (ref 70–99)
Potassium: 4 mEq/L (ref 3.5–5.1)
Sodium: 134 mEq/L — ABNORMAL LOW (ref 135–145)
Total Bilirubin: 0.4 mg/dL (ref 0.2–1.2)
Total Protein: 7.3 g/dL (ref 6.0–8.3)

## 2021-12-29 LAB — MICROALBUMIN / CREATININE URINE RATIO
Creatinine,U: 45.6 mg/dL
Microalb Creat Ratio: 3.7 mg/g (ref 0.0–30.0)
Microalb, Ur: 1.7 mg/dL (ref 0.0–1.9)

## 2021-12-29 LAB — TSH: TSH: 0.92 u[IU]/mL (ref 0.35–5.50)

## 2021-12-29 LAB — LIPID PANEL
Cholesterol: 203 mg/dL — ABNORMAL HIGH (ref 0–200)
HDL: 54.4 mg/dL (ref >39.00)
LDL Cholesterol: 127 mg/dL — ABNORMAL HIGH (ref 0–99)
NonHDL: 149.01
Total CHOL/HDL Ratio: 4
Triglycerides: 108 mg/dL (ref 0.0–149.0)
VLDL: 21.6 mg/dL (ref 0.0–40.0)

## 2021-12-29 LAB — HEMOGLOBIN A1C: Hgb A1c MFr Bld: 13.5 % — ABNORMAL HIGH (ref 4.6–6.5)

## 2021-12-29 NOTE — Progress Notes (Signed)
Complete physical exam  Patient: Alyssa Rhodes   DOB: 1960/09/23   61 y.o. Female  MRN: [id number] Visit Date: 12/29/2021  Subjective:    Chief Complaint  Patient presents with   Annual Exam    Patient states she is here for a annual exam    Alyssa Rhodes is a 61 y.o. female who presents today for a complete physical exam. She reports consuming a general diet. Home exercise routine includes  . She generally feels well. She reports sleeping well. She does have additional problems to discuss today.   T2dm Last A1c:  Lab Results  Component Value Date   HGBA1C 6.4 06/30/2021    Currently taking: metformin '500mg'$  po bid ac.  No new complications Reports good compliance with medications Diet has been steady Exercise habits have been limited, active job.   Dependent Edema Noted over past few weeks/months Worse throughout day Resolves overnight No claudication, denies other CV symptoms. Has been compliant with BP medications but poorly controlled today.  Hypertension: Patient Currently taking: amlodipine-benazepril 10-'20mg'$  po qd Good effect. No AEs. Denies CV symptoms including: chest pain, shob, doe, headache, visual changes, fatigue, claudication, and dependent edema.   Previous readings and labs: BP Readings from Last 3 Encounters:  12/29/21 (!) 160/90  12/14/21 (!) 156/86  06/30/21 136/68   Lab Results  Component Value Date   CREATININE 0.80 12/14/2021   Post menopausal bleeding New as of mid April Spotting/light bleeding most days Hx of bilat oopherectomy. Per pt still has uterus, cervix. Has not had pap in a number of years.  L Arm Pain Since ER visit where she had IV fluids Extends into upper medial arm, L axilla, and L side of chest.  Reproducible. Tenderness. No CV symptoms assoc with pain. No bruising in arm. Per pt some swelling sensation.   Colon Ca screen Due. Never done. No fam hx. Opts for cologuard after mutual decision making.   Vision:Within the  last year Dental:Within Last 6 months STD Screen:No PSA:No Most recent fall risk assessment:    12/29/2021    8:13 AM  [place] in the past year? 0  Number falls in past yr: 0  Injury with Fall? 0  Risk for fall due to : No Fall Risks  Follow up Falls evaluation completed     Most recent depression screenings:    12/29/2021    8:13 AM 06/30/2021    7:53 AM  PHQ 2/9 Scores  PHQ - 2 Score 0 0  PHQ- 9 Score 0      Patient Active Problem List   Diagnosis Date Noted   Iron deficiency anemia 12/18/2019   Microcytic anemia 12/17/2019   Lupus ([place])    Other fatigue    Deficiency anemia    HTN (hypertension) 10/18/2011   Tobacco user 10/18/2011   Past Medical History:  Diagnosis Date   Diabetes mellitus without complication ([place])    Hyperlipidemia    Hypertension    Lupus ([place])    Thyroid nodule 03/2010   aspiration   Past Surgical History:  Procedure Laterality Date   ABDOMINAL HYSTERECTOMY     CESAREAN SECTION     SHOULDER SURGERY  2011   LEFT   Social History   Tobacco Use   Smoking status: Every Day    Packs/day: 1.00    Years: 41.00    Pack years: 41.00    Types: Cigarettes   Smokeless tobacco: Never  Vaping Use  Vaping Use: Never used  Substance Use Topics   Alcohol use: Yes    Alcohol/week: 3.0 standard drinks    Types: 3 Standard drinks or equivalent per week    Comment: 3/week   Drug use: No   Social History   Socioeconomic History   Marital status: Widowed    Spouse name: Not on file   Number of children: 2   Years of education: Not on file   Highest education level: Not on file  Occupational History   Occupation: accountant  Tobacco Use   Smoking status: Every Day    Packs/day: 1.00    Years: 41.00    Pack years: 41.00    Types: Cigarettes   Smokeless tobacco: Never  Vaping Use   Vaping Use: Never used  Substance and Sexual Activity   Alcohol use: Yes    Alcohol/week: 3.0 standard drinks    Types: 3 Standard drinks or  equivalent per week    Comment: 3/week   Drug use: No   Sexual activity: Not Currently  Other Topics Concern   Not on file  Social History Narrative   Not on file   Social Determinants of Health   Financial Resource Strain: Not on file  Food Insecurity: Not on file  Transportation Needs: Not on file  Physical Activity: Not on file  Stress: Not on file  Social Connections: Not on file  Intimate Partner Violence: Not on file   Family Status  Relation Name Status   Mother  Alive   Father  Deceased   Family History  Problem Relation Age of Onset   Asthma Mother        doe not know mother well, sister advised her   Diabetes Mother    Heart disease Father        heart attack   Hypertension Father    Allergies  Allergen Reactions   Codeine Nausea And Vomiting   Hydroxychloroquine Rash   Patient Care Team: Maximiano Coss, NP as PCP - General (Adult Health Nurse Practitioner)   Medications: Outpatient Medications Prior to Visit  Medication Sig   amLODipine (NORVASC) 5 MG tablet Take 5 mg by mouth daily.   amLODipine-benazepril (LOTREL) 10-20 MG capsule Take 1 capsule by mouth daily.   buPROPion (WELLBUTRIN SR) 150 MG 12 hr tablet Take 1 tablet (150 mg total) by mouth 2 (two) times daily.   folic acid (FOLVITE) 1 MG tablet TAKE 1 TABLET BY MOUTH ONCE DAILY FOR 30 DAYS   metFORMIN (GLUCOPHAGE) 500 MG tablet Take 1 tablet (500 mg total) by mouth 2 (two) times daily with a meal.   methotrexate 2.5 MG tablet 6 tabs a week reported 06/30/21   Multiple Vitamin (MULTIVITAMIN) tablet Take 1 tablet by mouth daily.   Omega-3 Fatty Acids (FISH OIL PO) Take by mouth daily.   No facility-administered medications prior to visit.    Review of Systems Per hpi   Last CBC Lab Results  Component Value Date   WBC 5.9 12/14/2021   HGB 16.3 (H) 12/14/2021   HCT 48.0 (H) 12/14/2021   MCV 90.7 12/14/2021   MCH 31.6 12/14/2021   RDW 12.8 12/14/2021   PLT 461 (H) 22/29/7989   Last  metabolic panel Lab Results  Component Value Date   GLUCOSE 563 (HH) 12/14/2021   NA 131 (L) 12/14/2021   K 3.8 12/14/2021   CL 93 (L) 12/14/2021   CO2 25 12/14/2021   BUN 8 12/14/2021   CREATININE 0.80 12/14/2021  GFRNONAA >60 12/14/2021   CALCIUM 9.7 12/14/2021   PHOS 3.8 02/09/2018   PROT 7.4 12/14/2021   ALBUMIN 4.1 12/14/2021   LABGLOB 3.0 09/23/2019   AGRATIO 1.4 09/23/2019   BILITOT 0.5 12/14/2021   ALKPHOS 99 12/14/2021   AST 31 12/14/2021   ALT 38 12/14/2021   ANIONGAP 12 12/14/2021   Last lipids Lab Results  Component Value Date   CHOL 152 06/30/2021   HDL 47.20 06/30/2021   LDLCALC 71 06/30/2021   TRIG 170.0 (H) 06/30/2021   CHOLHDL 3 06/30/2021   Last hemoglobin A1c Lab Results  Component Value Date   HGBA1C 6.4 06/30/2021   Last thyroid functions Lab Results  Component Value Date   TSH 0.63 06/30/2021   Last vitamin D No results found for: 25OHVITD2, 25OHVITD3, VD25OH Last vitamin B12 and Folate Lab Results  Component Value Date   VITAMINB12 729 12/17/2019        Objective:     BP (!) 160/90   Pulse 66   Temp 98.3 F (36.8 C) (Temporal)   Resp 18   Ht '5\' 2"'$  (1.575 m)   Wt 129 lb (58.5 kg)   SpO2 98%   BMI 23.59 kg/m   BP Readings from Last 3 Encounters:  12/29/21 (!) 160/90  12/14/21 (!) 156/86  06/30/21 136/68   Wt Readings from Last 3 Encounters:  12/29/21 129 lb (58.5 kg)  12/14/21 125 lb (56.7 kg)  06/30/21 118 lb 9.6 oz (53.8 kg)   SpO2 Readings from Last 3 Encounters:  12/29/21 98%  12/14/21 98%  06/30/21 95%      Physical Exam Vitals and nursing note reviewed.  Constitutional:      General: She is not in acute distress.    Appearance: Normal appearance. She is normal weight. She is not ill-appearing, toxic-appearing or diaphoretic.  HENT:     Head: Normocephalic and atraumatic.     Right Ear: Tympanic membrane, ear canal and external ear normal. There is no impacted cerumen.     Left Ear: Tympanic membrane,  ear canal and external ear normal. There is no impacted cerumen.     Nose: Nose normal. No congestion or rhinorrhea.     Mouth/Throat:     Mouth: Mucous membranes are moist.     Pharynx: Oropharynx is clear. No oropharyngeal exudate or posterior oropharyngeal erythema.  Eyes:     General: No scleral icterus.       Right eye: No discharge.        Left eye: No discharge.     Extraocular Movements: Extraocular movements intact.     Conjunctiva/sclera: Conjunctivae normal.     Pupils: Pupils are equal, round, and reactive to light.  Neck:     Vascular: No carotid bruit.  Cardiovascular:     Rate and Rhythm: Normal rate and regular rhythm.     Pulses: Normal pulses.     Heart sounds: Normal heart sounds. No murmur heard.   No friction rub. No gallop.  Pulmonary:     Effort: Pulmonary effort is normal. No respiratory distress.     Breath sounds: Normal breath sounds. No stridor. No wheezing, rhonchi or rales.  Chest:     Chest wall: No tenderness.  Abdominal:     General: Abdomen is flat. Bowel sounds are normal. There is no distension.     Palpations: There is no mass.     Tenderness: There is abdominal tenderness (LLQ, no distinct mass/lesion). There is no right CVA tenderness,  left CVA tenderness, guarding or rebound.     Hernia: No hernia is present.  Musculoskeletal:        General: Tenderness (L arm and axilla proximal to recent IV from ER visit.) present. No swelling, deformity or signs of injury. Normal range of motion.     Cervical back: Normal range of motion and neck supple. No rigidity or tenderness.     Right lower leg: Edema (+2 pitting) present.     Left lower leg: Edema (+2 pitting) present.  Lymphadenopathy:     Cervical: No cervical adenopathy.  Skin:    General: Skin is warm and dry.     Capillary Refill: Capillary refill takes less than 2 seconds.     Coloration: Skin is not jaundiced or pale.     Findings: No bruising, erythema, lesion or rash.  Neurological:      General: No focal deficit present.     Mental Status: She is alert and oriented to person, place, and time. Mental status is at baseline.     Cranial Nerves: No cranial nerve deficit.     Sensory: No sensory deficit.     Motor: No weakness.     Coordination: Coordination normal.     Gait: Gait normal.     Deep Tendon Reflexes: Reflexes normal.  Psychiatric:        Mood and Affect: Mood normal.        Behavior: Behavior normal.        Thought Content: Thought content normal.        Judgment: Judgment normal.     No results found for any visits on 12/29/21.    Assessment & Plan:    Routine Health Maintenance and Physical Exam  Immunization History  Administered Date(s) Administered   Influenza,inj,Quad PF,6+ Mos 04/28/2016, 03/22/2019, 06/30/2021   Influenza-Unspecified 06/11/2020   PFIZER(Purple Top)SARS-COV-2 Vaccination 12/01/2019, 12/22/2019, 07/04/2020   Tdap 09/03/2015    Health Maintenance  Topic Date Due   COLONOSCOPY (Pts 45-27yr Insurance coverage will need to be confirmed)  Never done   PAP SMEAR-Modifier  12/29/2021 (Originally 01/04/1991)   COVID-19 Vaccine (4 - Booster for [place]) 01/14/2022 (Originally 08/29/2020)   Zoster Vaccines- Shingrix (1 of 2) 03/31/2022 (Originally 01/04/1980)   HIV Screening  12/30/2022 (Originally 01/04/1976)   INFLUENZA VACCINE  02/22/2022   MAMMOGRAM  08/18/2022   TETANUS/TDAP  09/02/2025   Hepatitis C Screening  Completed   HPV VACCINES  Aged Out    Discussed health benefits of physical activity, and encouraged her to engage in regular exercise appropriate for her age and condition.  Problem List Items Addressed This Visit   None Visit Diagnoses     Post-menopausal bleeding    -  Primary   Relevant Orders   Ambulatory referral to Gynecology   [place] Complete   TSH   Screen for colon cancer       Relevant Orders   Cologuard   Uncontrolled type 2 diabetes mellitus with hyperglycemia [place]       Relevant  Orders   Amb ref to Medical Nutrition Therapy-MNT   Hemoglobin A1c   Lipid panel   TSH   EKG 12-Lead (Completed)   Ambulatory referral to Ophthalmology   Urine Microalbumin w/creat. ratio   Essential hypertension       Relevant Orders   CBC with Differential/Platelet   Comprehensive metabolic panel   TSH   EKG 12-Lead (Completed)   Dependent edema  Relevant Orders   EKG 12-Lead (Completed)   Abnormal EKG       Relevant Orders   Ambulatory referral to Cardiology   Annual physical exam          Return in about 3 months (around 03/31/2022) for Chronic Conditions.     PLAN Exam remarkable as above.  EKG obtained. No comparison available. Widespread ST depression with some upsloping in multiple leads. No acute events noted but will refer to cardiology for further work up. Labs collected. Will follow up with the patient as warranted. Concern for post-menopausal bleeding. Will obtain pelvic US, refer to GYN Check A1c. If above 7.5, can start farxiga '10mg'$  po qd. Advised on risks. Can consider glp or more likely a long acting insulin if additional agent needed. If lytes wnl, can add chlorhtalidone '25mg'$  po qd for BP. Patient encouraged to call clinic with any questions, comments, or concerns.  Maximiano Coss, NP

## 2021-12-29 NOTE — Patient Instructions (Addendum)
Ms Friscia Doristine Devoid to see you  Let's get labs done  Will order ultrasound of pelvis for the bleeding  We should get you in to see a cardiologist - they will call you  We should get you in to a gynecologist - they will call you  I will refer for a diabetic eye exam  I have sent cologuard to you - it will arrive soon  Thank you  Rich    If you have lab work done today you will be contacted with your lab results within the next 2 weeks.  If you have not heard from [place] then please contact us. The fastest way to get your results is to register for My Chart.   IF you received an x-ray today, you will receive an invoice from [facility]. Please contact [facility] at [phone] with questions or concerns regarding your invoice.   IF you received labwork today, you will receive an invoice from [facility]. Please contact [facility] at [phone] with questions or concerns regarding your invoice.   Our billing staff will not be able to assist you with questions regarding bills from these companies.  You will be contacted with the lab results as soon as they are available. The fastest way to get your results is to activate your My Chart account. Instructions are located on the last page of this paperwork. If you have not heard from [place] regarding the results in 2 weeks, please contact this office.

## 2021-12-30 ENCOUNTER — Ambulatory Visit
Admission: RE | Admit: 2021-12-30 | Discharge: 2021-12-30 | Disposition: A | Discharge status: Home / Self Care | Payer: BC Managed Care – PPO | Source: Ambulatory Visit | Attending: Registered Nurse | Admitting: Registered Nurse

## 2021-12-30 ENCOUNTER — Other Ambulatory Visit: Payer: Self-pay | Admitting: Registered Nurse

## 2021-12-30 DIAGNOSIS — N950 Postmenopausal bleeding: Secondary | ICD-10-CM | POA: Diagnosis not present

## 2021-12-30 DIAGNOSIS — Z90710 Acquired absence of both cervix and uterus: Secondary | ICD-10-CM | POA: Diagnosis not present

## 2021-12-30 DIAGNOSIS — E1169 Type 2 diabetes mellitus with other specified complication: Secondary | ICD-10-CM

## 2021-12-30 MED ORDER — ATORVASTATIN CALCIUM 40 MG PO TABS: 40.0000 mg | ORAL_TABLET | Freq: Every day | ORAL | 3 refills | Status: DC

## 2022-01-05 NOTE — Progress Notes (Signed)
Cardiology Office Note:    Date:  01/06/2022   ID:  Alyssa Rhodes, DOB 1960/11/27, MRN [id number]  PCP:  Maximiano Coss, NP   [facility] Providers Cardiologist:  Janina Mayo, MD     Referring MD: Maximiano Coss, NP   No chief complaint on file. Abnormal EKG  History of Present Illness:    Alyssa Rhodes is a 61 y.o. female with a hx of DM2, HTN, Lupus on methotrexate, quit smoking (~40 years),ECG showing sinus rhythm with poor R wave progression. She has no hx of cardiac dx. She notes LE swelling since late May. Swelling progresses during the day. In the AM it is better. No orthopnea or PND. She has normal renal function. Father had heart disease.  Past Medical History:  Diagnosis Date   Diabetes mellitus without complication ([place])    Hyperlipidemia    Hypertension    Lupus ([place])    Thyroid nodule 03/2010   aspiration    Past Surgical History:  Procedure Laterality Date   ABDOMINAL HYSTERECTOMY     CESAREAN SECTION     SHOULDER SURGERY  2011   LEFT    Current Medications: Current Meds  Medication Sig   amLODipine-olmesartan (AZOR) 10-40 MG tablet Take 1 tablet by mouth daily.   aspirin EC 81 MG tablet Take 325 mg by mouth daily. Swallow whole.   atorvastatin (LIPITOR) 40 MG tablet Take 1 tablet (40 mg total) by mouth daily.   buPROPion (WELLBUTRIN SR) 150 MG 12 hr tablet Take 1 tablet (150 mg total) by mouth 2 (two) times daily.   chlorthalidone (HYGROTON) 25 MG tablet Take 1 tablet (25 mg total) by mouth daily.   folic acid (FOLVITE) 1 MG tablet TAKE 1 TABLET BY MOUTH ONCE DAILY FOR 30 DAYS   metFORMIN (GLUCOPHAGE) 500 MG tablet Take 1 tablet (500 mg total) by mouth 2 (two) times daily with a meal.   methotrexate 2.5 MG tablet 6 tabs a week reported 06/30/21   Multiple Vitamin (MULTIVITAMIN) tablet Take 1 tablet by mouth daily.   Omega-3 Fatty Acids (FISH OIL PO) Take by mouth daily.   [DISCONTINUED] amLODipine (NORVASC) 5 MG tablet Take 5 mg by mouth daily.    [DISCONTINUED] amLODipine-benazepril (LOTREL) 10-20 MG capsule Take 1 capsule by mouth daily.     Allergies:   Codeine and Hydroxychloroquine   Social History   Socioeconomic History   Marital status: Widowed    Spouse name: Not on file   Number of children: 2   Years of education: Not on file   Highest education level: Not on file  Occupational History   Occupation: accountant  Tobacco Use   Smoking status: Every Day    Packs/day: 1.00    Years: 41.00    Total pack years: 41.00    Types: Cigarettes   Smokeless tobacco: Never  Vaping Use   Vaping Use: Never used  Substance and Sexual Activity   Alcohol use: Yes    Alcohol/week: 3.0 standard drinks of alcohol    Types: 3 Standard drinks or equivalent per week    Comment: 3/week   Drug use: No   Sexual activity: Not Currently  Other Topics Concern   Not on file  Social History Narrative   Not on file   Social Determinants of Health   Financial Resource Strain: Low Risk  (11/19/2018)   Overall Financial Resource Strain (CARDIA)    Difficulty of Paying Living Expenses: Not hard at all  Food Insecurity:  No Food Insecurity (11/19/2018)   Hunger Vital Sign    Worried About Running Out of Food in the Last Year: Never true    Ran Out of Food in the Last Year: Never true  Transportation Needs: No Transportation Needs (11/19/2018)   PRAPARE - Hydrologist (Medical): No    Lack of Transportation (Non-Medical): No  Physical Activity: Inactive (11/19/2018)   Exercise Vital Sign    Days of Exercise per Week: 0 days    Minutes of Exercise per Session: 0 min  Stress: No Stress Concern Present (11/19/2018)   [place]    Feeling of Stress : Only a little  Social Connections: Moderately Isolated (11/19/2018)   Social Connection and Isolation Panel [NHANES]    Frequency of Communication with Friends and Family: Never    Frequency of Social  Gatherings with Friends and Family: Never    Attends Religious Services: More than 4 times per year    Active Member of [employer] or Organizations: No    Attends Archivist Meetings: Never    Marital Status: Widowed     Family History: The patient's family history includes Asthma in her mother; Diabetes in her mother; Heart disease in her father; Hypertension in her father.  ROS:   Please see the history of present illness.     All other systems reviewed and are negative.  EKGs/Labs/Other Studies Reviewed:    The following studies were reviewed today:   EKG:  EKG is  ordered today.  The ekg ordered today demonstrates   Sinus tachycardia 105 bpm  Recent Labs: 12/29/2021: ALT 30; BUN 10; Creatinine, Ser 0.68; Hemoglobin 15.1; Platelets 528.0; Potassium 4.0; Sodium 134; TSH 0.92   Recent Lipid Panel    Component Value Date/Time   CHOL 203 (H) 12/29/2021 0924   CHOL 131 09/23/2019 1002   TRIG 108.0 12/29/2021 0924   HDL 54.40 12/29/2021 0924   HDL 52 09/23/2019 1002   CHOLHDL 4 12/29/2021 0924   VLDL 21.6 12/29/2021 0924   LDLCALC 127 (H) 12/29/2021 0924   LDLCALC 57 09/23/2019 1002     Risk Assessment/Calculations:           Physical Exam:    VS:  BP (!) 154/86   Pulse (!) 105     Wt Readings from Last 3 Encounters:  12/29/21 129 lb (58.5 kg)  12/14/21 125 lb (56.7 kg)  06/30/21 118 lb 9.6 oz (53.8 kg)     GEN:  Well nourished, well developed in no acute distress HEENT: Normal NECK: No JVD; No carotid bruits LYMPHATICS: No lymphadenopathy CARDIAC: RRR, no murmurs, rubs, gallops RESPIRATORY:  Clear to auscultation without rales, wheezing or rhonchi  ABDOMEN: Soft, non-tender, non-distended MUSCULOSKELETAL:  No edema; No deformity  SKIN: Warm and dry NEUROLOGIC:  Alert and oriented x 3 PSYCHIATRIC:  Normal affect   ASSESSMENT:    Abnormal EKG: no signs of ACS. She has no known hx of MI. She is asymptomatic without signs of CHF. Poor r wave  progression does not always signify MI.  LE edema: prescribed 15 mg norvasc, may contribute. She has no signs of CHF. 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 A1c goal < 7. LDL 127 mg/dL; goal < 100 mg/dL, will repeat lipids  HTN: change.amlodipine 10- benazapril 20 mg daily ( she was also prescribed amlodipine 5) to  amlodipine 10 mg- olmesartan 40 mg daily and add chlorthalidone 25 mg daily  Smoking: quit, encouraging to continue    PLAN:    In order of problems listed above:  Fasting lipid in 6 weeks Change amlodipine 10-benzapril 20/norvasc 5 to norvasc 10 mg-olmesartan 40 mg daily and chlorthalidone 25 mg daily BMET 2 weeks Compression stockings 15-20 mmHg        Medication Adjustments/Labs and Tests Ordered: Current medicines are reviewed at length with the patient today.  Concerns regarding medicines are outlined above.  Orders Placed This Encounter  Procedures   Compression stockings   Basic metabolic panel   Lipid panel   EKG 12-Lead   Meds ordered this encounter  Medications   chlorthalidone (HYGROTON) 25 MG tablet    Sig: Take 1 tablet (25 mg total) by mouth daily.    Dispense:  90 tablet    Refill:  3   amLODipine-olmesartan (AZOR) 10-40 MG tablet    Sig: Take 1 tablet by mouth daily.    Dispense:  30 tablet    Refill:  3    Patient Instructions  Medication Instructions:   STOP: ALL AMLODIPINE and AMLODIPINE/BENAZEPRIL COMBO   START: AMLODIPINE '10mg'$ /OLMESARTAN '40mg'$  once daily COMBINATION DRUG   START: CHLORTHALIDONE '25mg'$  ONCE DAILY   *If you need a refill on your cardiac medications before your next appointment, please call your pharmacy*  Lab Work: Please return for Blood Work in [place] . No appointment needed, lab here at the office is open Monday-Friday from 8AM to 4PM and closed daily for lunch from 12:45-1:45.   THEN   Please return for FASTING Blood Work  in [place]. No appointment needed, lab here at the office is open Monday-Friday from 8AM to 4PM and closed daily for lunch from 12:45-1:45.   If you have labs (blood work) drawn today and your tests are completely normal, you will receive your results only by: [place] (if you have MyChart) OR A paper copy in the mail If you have any lab test that is abnormal or we need to change your treatment, we will call you to review the results.  Testing/Procedures: None Ordered At This Time.   Follow-Up: At [facility], you and your health needs are our priority.  As part of our continuing mission to provide you with exceptional heart care, we have created designated Provider Care Teams.  These Care Teams include your primary Cardiologist (physician) and Advanced Practice Providers (APPs -  Physician Assistants and Nurse Practitioners) who all work together to provide you with the care you need, when you need it.  Your next appointment:   3 month(s)  The format for your next appointment:   In Person  Provider:   Janina Mayo, MD    Other Instructions  PLEASE PICK UP COMPRESSION STOCKINGS 15-20 mmhg  Please obtain a blood pressure cuff and your blood pressure goal is <130/80 mmHg   Signed, Janina Mayo, MD  01/06/2022 8:26 AM    [place]

## 2022-01-06 ENCOUNTER — Ambulatory Visit (INDEPENDENT_AMBULATORY_CARE_PROVIDER_SITE_OTHER): Payer: BC Managed Care – PPO | Admitting: Internal Medicine

## 2022-01-06 ENCOUNTER — Encounter: Payer: Self-pay | Admitting: Internal Medicine

## 2022-01-06 VITALS — BP 154/86 | HR 105

## 2022-01-06 DIAGNOSIS — Z79899 Other long term (current) drug therapy: Secondary | ICD-10-CM | POA: Diagnosis not present

## 2022-01-06 DIAGNOSIS — M7989 Other specified soft tissue disorders: Secondary | ICD-10-CM

## 2022-01-06 DIAGNOSIS — I10 Essential (primary) hypertension: Secondary | ICD-10-CM | POA: Diagnosis not present

## 2022-01-06 MED ORDER — AMLODIPINE-OLMESARTAN 10-40 MG PO TABS: 1.0000 | ORAL_TABLET | Freq: Every day | ORAL | 3 refills | Status: DC

## 2022-01-06 MED ORDER — CHLORTHALIDONE 25 MG PO TABS: 25.0000 mg | ORAL_TABLET | Freq: Every day | ORAL | 3 refills | Status: DC

## 2022-01-06 NOTE — Patient Instructions (Addendum)
Medication Instructions:   STOP: ALL AMLODIPINE and AMLODIPINE/BENAZEPRIL COMBO   START: AMLODIPINE '10mg'$ /OLMESARTAN '40mg'$  once daily COMBINATION DRUG   START: CHLORTHALIDONE '25mg'$  ONCE DAILY   *If you need a refill on your cardiac medications before your next appointment, please call your pharmacy*  Lab Work: Please return for Blood Work in [place] . No appointment needed, lab here at the office is open Monday-Friday from 8AM to 4PM and closed daily for lunch from 12:45-1:45.   THEN   Please return for FASTING Blood Work in [place]. No appointment needed, lab here at the office is open Monday-Friday from 8AM to 4PM and closed daily for lunch from 12:45-1:45.   If you have labs (blood work) drawn today and your tests are completely normal, you will receive your results only by: [place] (if you have MyChart) OR A paper copy in the mail If you have any lab test that is abnormal or we need to change your treatment, we will call you to review the results.  Testing/Procedures: None Ordered At This Time.   Follow-Up: At [facility], you and your health needs are our priority.  As part of our continuing mission to provide you with exceptional heart care, we have created designated Provider Care Teams.  These Care Teams include your primary Cardiologist (physician) and Advanced Practice Providers (APPs -  Physician Assistants and Nurse Practitioners) who all work together to provide you with the care you need, when you need it.  Your next appointment:   3 month(s)  The format for your next appointment:   In Person  Provider:   Janina Mayo, MD    Other Instructions  PLEASE PICK UP COMPRESSION STOCKINGS 15-20 mmhg  Please obtain a blood pressure cuff and your blood pressure goal is <130/80 mmHg

## 2022-01-11 DIAGNOSIS — Z1211 Encounter for screening for malignant neoplasm of colon: Secondary | ICD-10-CM | POA: Diagnosis not present

## 2022-01-12 ENCOUNTER — Ambulatory Visit: Payer: BC Managed Care – PPO | Admitting: Radiology

## 2022-01-12 ENCOUNTER — Encounter: Payer: Self-pay | Admitting: Radiology

## 2022-01-12 VITALS — BP 132/76 | Ht 62.0 in | Wt 128.0 lb

## 2022-01-12 DIAGNOSIS — N952 Postmenopausal atrophic vaginitis: Secondary | ICD-10-CM | POA: Diagnosis not present

## 2022-01-12 MED ORDER — ESTRADIOL 0.1 MG/GM VA CREA: 1.0000 g | TOPICAL_CREAM | VAGINAL | 12 refills | Status: DC

## 2022-01-12 NOTE — Progress Notes (Signed)
   Alyssa Rhodes 22-Dec-1960 [id number]   History:  61 y.o. G2P2 presents as a referral for post menopausal bleeding. S/p TAH 2001 for fibroids. Bleeding has been on and off the past month, light pink to red, wears a pantiliner every day. She is not sexually active. Has no pain associated.Ultrasound was ordered by PCP and completed 12/30/21  CLINICAL DATA:  Post menopausal bleeding   EXAM: TRANSABDOMINAL AND TRANSVAGINAL ULTRASOUND OF PELVIS   TECHNIQUE: Both transabdominal and transvaginal ultrasound examinations of the pelvis were performed. Transabdominal technique was performed for global imaging of the pelvis including uterus, ovaries, adnexal regions, and pelvic cul-de-sac. It was necessary to proceed with endovaginal exam following the transabdominal exam to visualize the adnexa.   COMPARISON:  None Available.   FINDINGS: Uterus   Status post hysterectomy.   Endometrium   Status post hysterectomy   Right ovary   Measurements: 1.7 x 1.1 x 1.3 cm = volume: 1.3 mL. Normal appearance/no adnexal mass.   Left ovary   Not seen   Other findings   No abnormal free fluid.   IMPRESSION: Status post hysterectomy.  Nonvisualized left ovary     Electronically Signed   By: Donavan Foil M.D.   On: 12/30/2021 21:06    Past medical history, past surgical history, family history and social history were all reviewed and documented in the EPIC chart.  ROS:  A ROS was performed and pertinent positives and negatives are included.  Exam:  Vitals:   01/12/22 0821  BP: 132/76  Weight: 128 lb (58.1 kg)  Height: '5\' 2"'$  (1.575 m)   Body mass index is 23.41 kg/m.  General appearance:  Normal  Abdominal  Soft,nontender, without masses, guarding or rebound.  Liver/spleen:  No organomegaly noted  Hernia:  None appreciated  Skin  Inspection:  Grossly normal Breasts: Examined lying and sitting.   Right: Without masses, retractions, nipple discharge or axillary  adenopathy.   Left: Without masses, retractions, nipple discharge or axillary adenopathy. Genitourinary   Inguinal/mons:  Normal without inguinal adenopathy  External genitalia:  Normal appearing vulva with no masses, tenderness, or lesions  BUS/Urethra/Skene's glands:  Normal  Vagina:  Normal appearing with normal color and discharge, no lesions. Atrophy moderate, scant blood present  Cervix:  absent  Uterus:  absent  Adnexa/parametria:     Rt: Normal in size, without masses or tenderness.   Lt: Normal in size, without masses or tenderness.  Anus and perineum: Normal  Digital rectal exam: Normal sphincter tone without palpated masses or tenderness  Chaperone offered and declined  Assessment/Plan:   1. Vaginal atrophy - estradiol (ESTRACE VAGINAL) 0.1 MG/GM vaginal cream; Place 1 g vaginally 3 (three) times a week.  Dispense: 42.5 g; Refill: 12   Follow up 3 months    Kameren Baade B WHNP-BC 8:43 AM 01/12/2022

## 2022-01-13 ENCOUNTER — Other Ambulatory Visit: Payer: Self-pay

## 2022-01-13 MED ORDER — METFORMIN HCL 500 MG PO TABS
500.0000 mg | ORAL_TABLET | Freq: Two times a day (BID) | ORAL | 0 refills | Status: DC
Start: 2022-01-13 — End: 2022-03-28

## 2022-01-17 LAB — COLOGUARD: COLOGUARD: NEGATIVE

## 2022-01-20 ENCOUNTER — Other Ambulatory Visit: Payer: Self-pay

## 2022-01-20 DIAGNOSIS — Z79899 Other long term (current) drug therapy: Secondary | ICD-10-CM

## 2022-01-30 IMAGING — MG MM DIGITAL SCREENING BILAT W/ TOMO AND CAD
8 series · 8 of 24 positions shown · non-contrast
Comparison: None.

CLINICAL DATA: Screening.

EXAM:
DIGITAL SCREENING BILATERAL MAMMOGRAM WITH TOMOSYNTHESIS AND CAD
TECHNIQUE: Bilateral screening digital craniocaudal and mediolateral oblique
mammograms were obtained. Bilateral screening digital breast
tomosynthesis was performed. The images were evaluated with
computer-aided detection.

[L MLO synth-2D]
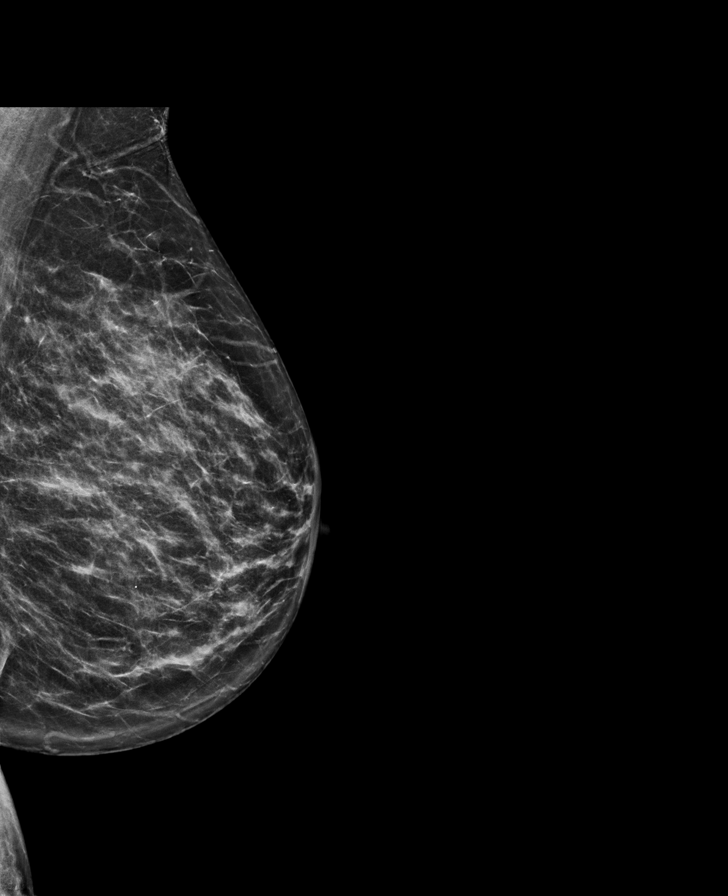

[L CC synth-2D]
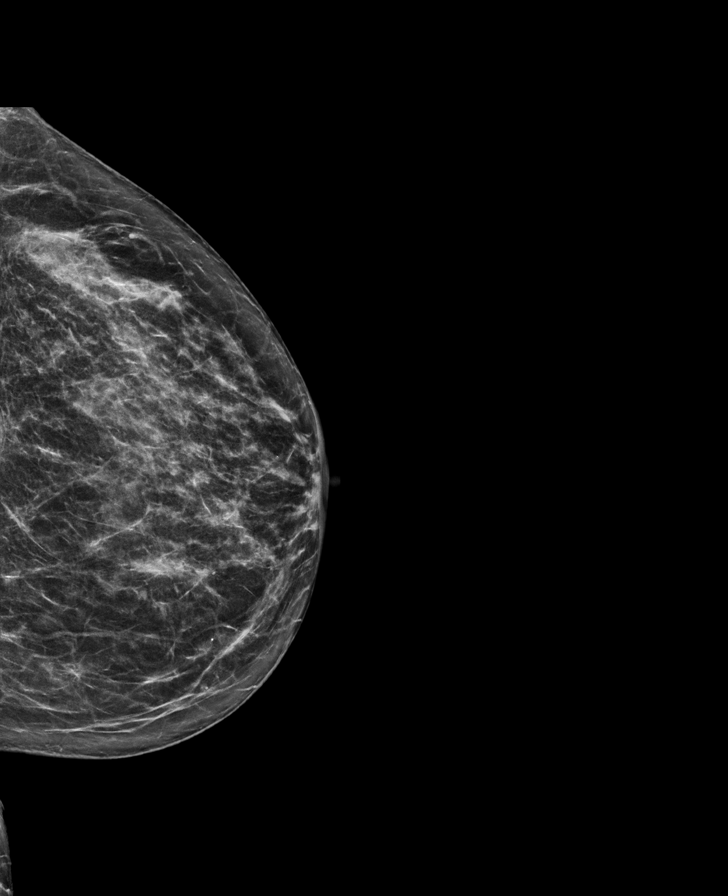

[R MLO synth-2D]
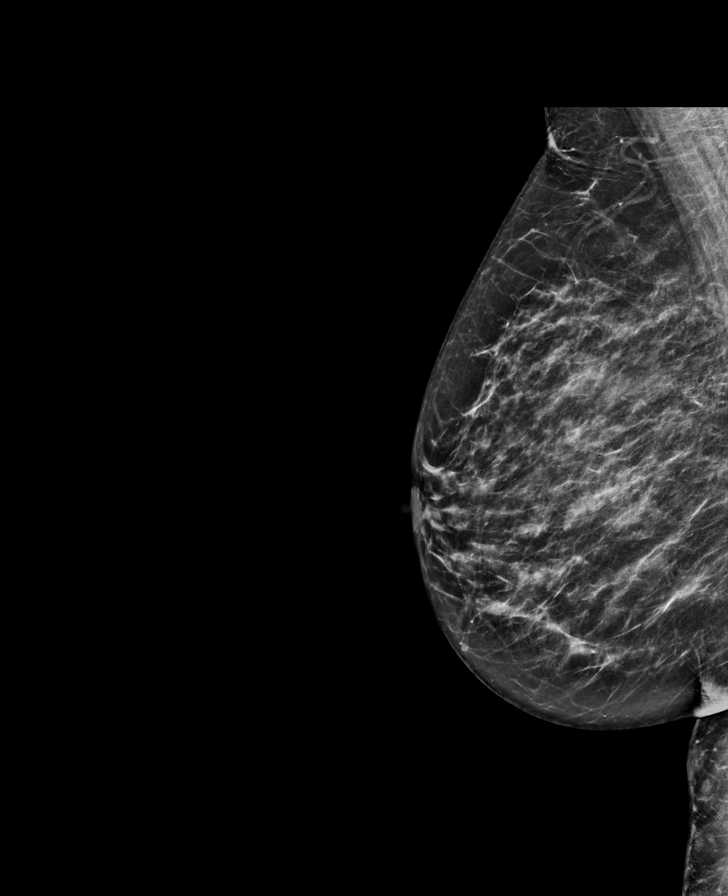

[R CC synth-2D]
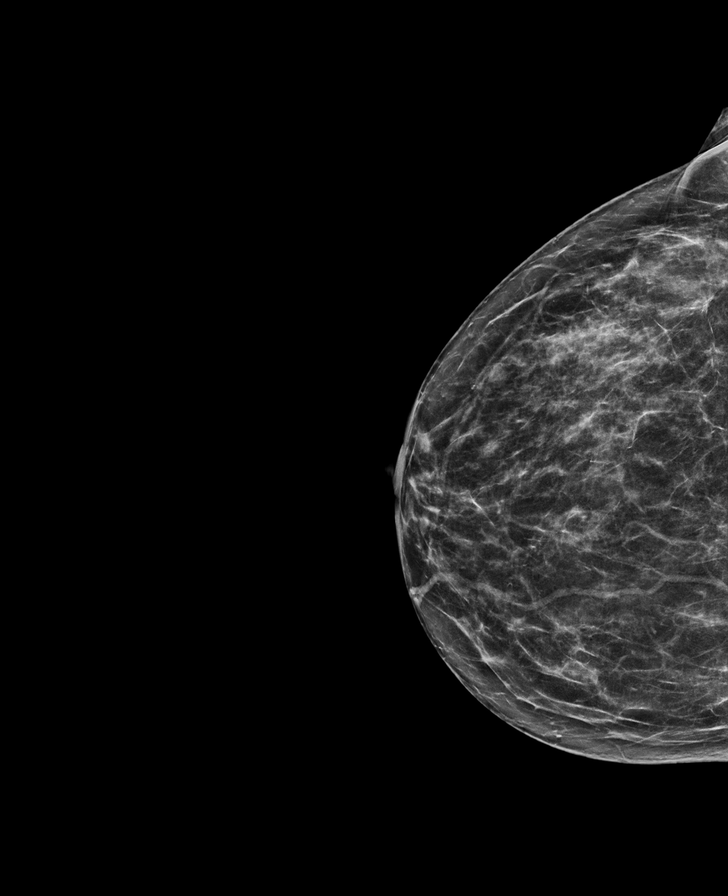

[R CC tomo · tomo slice 31/62.0]
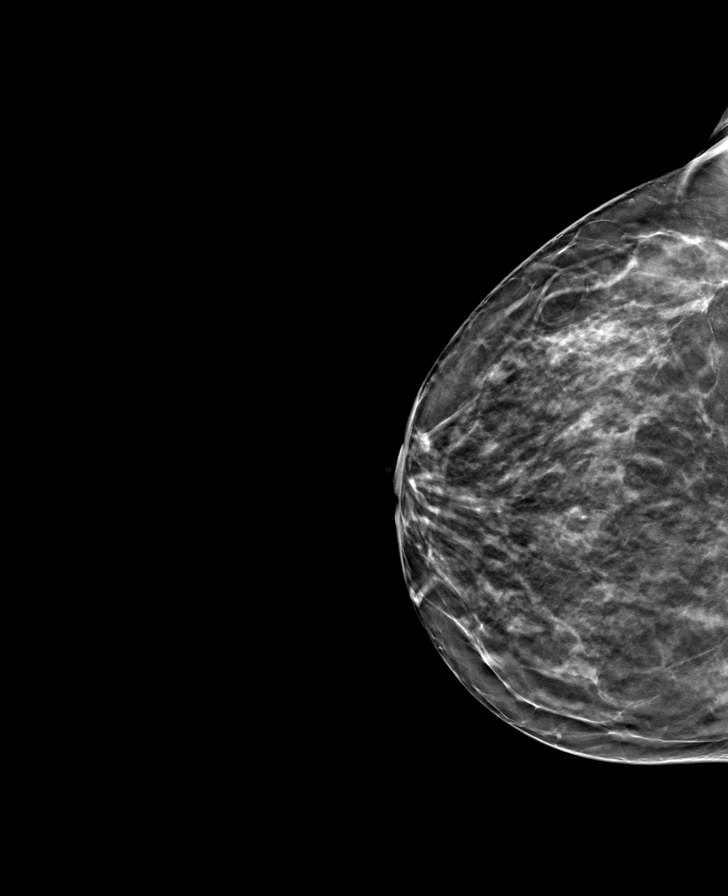

[L MLO tomo · tomo slice 31/62.0]
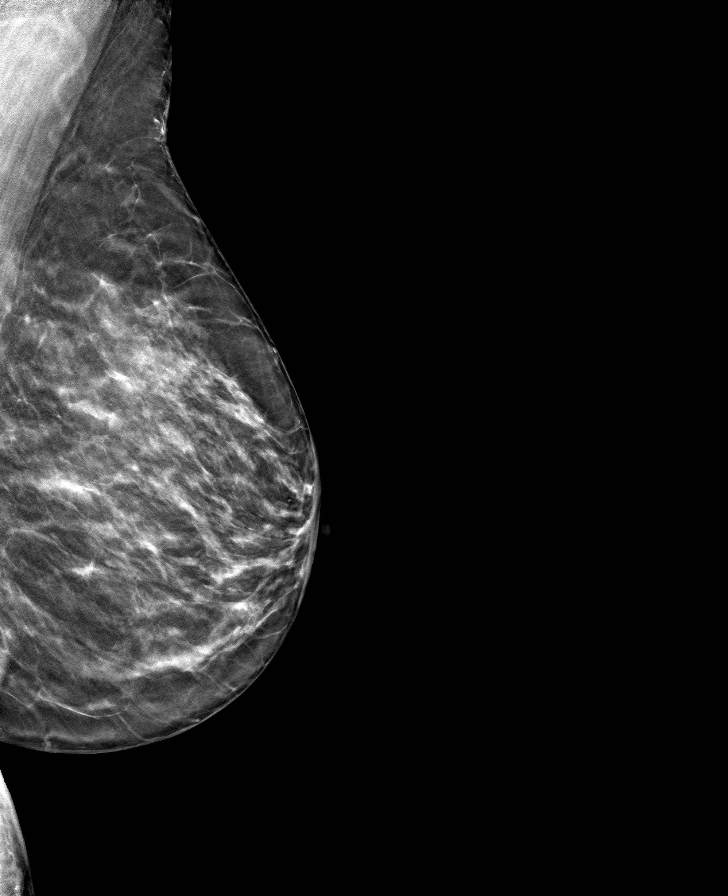

[L CC tomo · tomo slice 33/64.0]
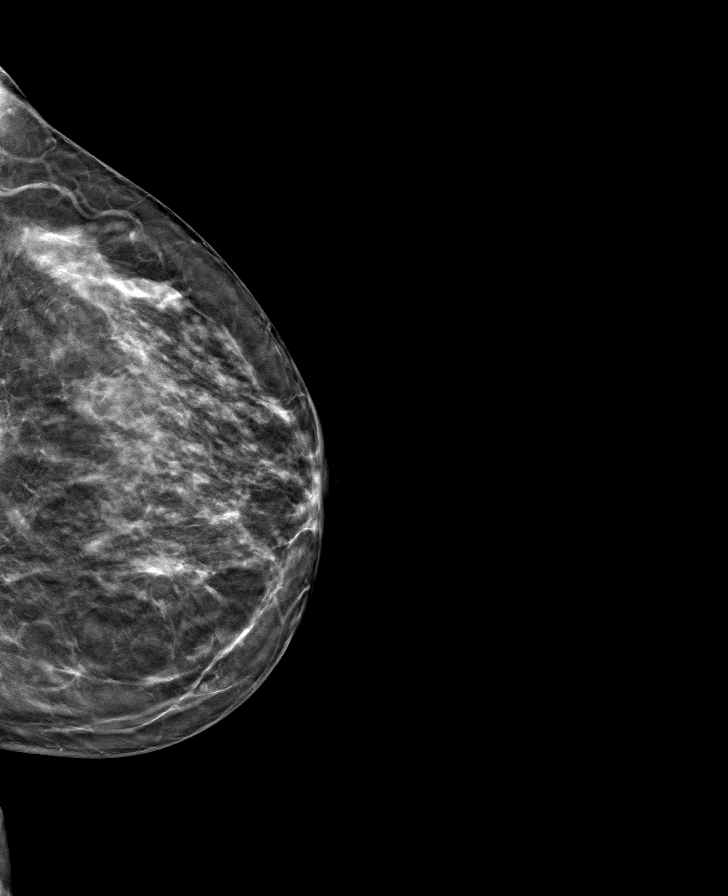

[R MLO tomo · tomo slice 33/65.0]
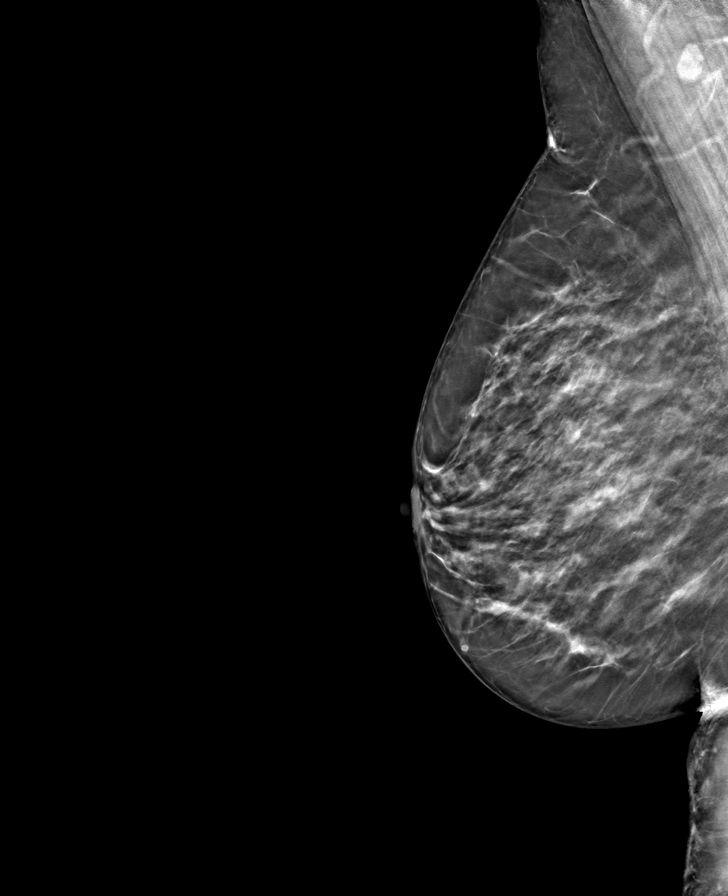

[8 of 24 positions shown; findings below may reference images not displayed]

ACR Breast Density Category c: The breast tissue is heterogeneously
dense, which may obscure small masses
FINDINGS: There are no findings suspicious for malignancy.
IMPRESSION: No mammographic evidence of malignancy. A result letter of this
screening mammogram will be mailed directly to the patient.

RECOMMENDATION:
Screening mammogram in one year. (Code:C8-T-HNK)

BI-RADS CATEGORY  1: Negative.

## 2022-02-04 DIAGNOSIS — Z79899 Other long term (current) drug therapy: Secondary | ICD-10-CM | POA: Diagnosis not present

## 2022-02-04 DIAGNOSIS — I10 Essential (primary) hypertension: Secondary | ICD-10-CM | POA: Diagnosis not present

## 2022-02-04 LAB — BASIC METABOLIC PANEL
BUN/Creatinine Ratio: 14 (ref 12–28)
BUN: 13 mg/dL (ref 8–27)
CO2: 25 mmol/L (ref 20–29)
Calcium: 9.6 mg/dL (ref 8.7–10.3)
Chloride: 98 mmol/L (ref 96–106)
Creatinine, Ser: 0.92 mg/dL (ref 0.57–1.00)
Glucose: 131 mg/dL — ABNORMAL HIGH (ref 70–99)
Potassium: 4.8 mmol/L (ref 3.5–5.2)
Sodium: 135 mmol/L (ref 134–144)
eGFR: 71 mL/min/{1.73_m2} (ref >59)

## 2022-02-04 LAB — LIPID PANEL
Chol/HDL Ratio: 3.1 ratio (ref 0.0–4.4)
Cholesterol, Total: 118 mg/dL (ref 100–199)
HDL: 38 mg/dL — ABNORMAL LOW (ref >39)
LDL Chol Calc (NIH): 66 mg/dL (ref 0–99)
Triglycerides: 69 mg/dL (ref 0–149)
VLDL Cholesterol Cal: 14 mg/dL (ref 5–40)

## 2022-02-14 ENCOUNTER — Telehealth: Payer: Self-pay | Admitting: Internal Medicine

## 2022-02-14 NOTE — Telephone Encounter (Signed)
Pt is returning call. Requesting call back on her mobile number.

## 2022-02-14 NOTE — Telephone Encounter (Signed)
Patient stated her men's large stockings are too tight and pinch her legs. Recommended that she remove them and try to find a larger size, if possible. Patient is taking chlorthalidone and is reducing sodium intake. Cancelled appointment with Vella Raring, NP and made appointment with Dr. Harl Bowie for 7/26.

## 2022-02-14 NOTE — Telephone Encounter (Signed)
LMTCB

## 2022-02-14 NOTE — Telephone Encounter (Signed)
Pt c/o swelling: STAT is pt has developed SOB within 24 hours  If swelling, where is the swelling located? feet all the way up her legs all the way up her arms and stomach.  Worse on her left side.   How much weight have you gained and in what time span? 4-5lbs over a week   Have you gained 3 pounds in a day or 5 pounds in a week? No   Do you have a log of your daily weights (if so, list)? No   Are you currently taking a fluid pill? yes  Are you currently SOB? no  Have you traveled recently? no

## 2022-02-14 NOTE — Telephone Encounter (Signed)
Patient reports edema both legs, left more than rt, that goes up the leg to the groin and abdomen. Shoes and clothing fit tighter. Also has edema in left arm and hand. Sleeps with one pillow, denies sob. Encouraged to elevate left arm to help relieve swelling.

## 2022-02-14 NOTE — Telephone Encounter (Signed)
Thank you for the update. Looks like Dr. Harl Bowie may have an available appt this Wednesday for sooner evaluation.   Ensure she is following low sodium diet, wearing compression stockings, reducing sodium intake, taking Chlorthalidone as prescribed.   Loel Dubonnet, NP

## 2022-02-15 NOTE — Progress Notes (Unsigned)
Cardiology Office Note:    Date:  02/16/2022   ID:  Alyssa Rhodes, DOB [birth year], MRN [id number]  PCP:  Maximiano Coss, NP   [facility] Providers Cardiologist:  Janina Mayo, MD     Referring MD: Maximiano Coss, NP   No chief complaint on file. Abnormal EKG  History of Present Illness:    Alyssa Rhodes is a 61 y.o. 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 She has no hx of cardiac dx. She notes LE swelling since late May. Swelling progresses during the day. In the AM it is better. No orthopnea or PND. She has normal renal function. Father had heart disease.  Interim hx: Trialed compression stockings and patient notes they are tight. She also notes abdominal and arm swelling. She has swelling in her feet, legs and L arm.  This has been going on since May.This has progressed since she was here. She had recent labs , crt is normal. Urine showed normal microalb creatinine ratio. TSH is normal.  She still denies orthopnea or PND  Past Medical History:  Diagnosis Date   Diabetes mellitus without complication ([place])    Hyperlipidemia    Hypertension    Lupus ([place])    Thyroid nodule 03/2010   aspiration    Past Surgical History:  Procedure Laterality Date   ABDOMINAL HYSTERECTOMY     CESAREAN SECTION     SHOULDER SURGERY  2011   LEFT    Current Medications: Current Meds  Medication Sig   Ascorbic Acid (VITAMIN C PO) Take by mouth.   atorvastatin (LIPITOR) 40 MG tablet Take 1 tablet (40 mg total) by mouth daily.   buPROPion (WELLBUTRIN SR) 150 MG 12 hr tablet Take 1 tablet (150 mg total) by mouth 2 (two) times daily.   chlorthalidone (HYGROTON) 25 MG tablet Take 1 tablet (25 mg total) by mouth daily.   estradiol (ESTRACE VAGINAL) 0.1 MG/GM vaginal cream Place 1 g vaginally 3 (three) times a week.   folic acid (FOLVITE) 1 MG tablet TAKE 1 TABLET BY MOUTH ONCE  DAILY FOR 30 DAYS   furosemide (LASIX) 40 MG tablet Take 1 tablet (40 mg total) by mouth daily.   metFORMIN (GLUCOPHAGE) 500 MG tablet Take 1 tablet (500 mg total) by mouth 2 (two) times daily with a meal.   methotrexate 2.5 MG tablet 6 tabs a week reported 06/30/21   Multiple Vitamin (MULTIVITAMIN) tablet Take 1 tablet by mouth daily.   Omega-3 Fatty Acids (FISH OIL PO) Take by mouth daily.   [DISCONTINUED] amLODipine-olmesartan (AZOR) 10-40 MG tablet Take 1 tablet by mouth daily.   [DISCONTINUED] aspirin EC 81 MG tablet Take 325 mg by mouth daily. Swallow whole.   [DISCONTINUED] olmesartan (BENICAR) 40 MG tablet Take 1 tablet (40 mg total) by mouth daily.     Allergies:   Codeine and Hydroxychloroquine   Social History   Socioeconomic History   Marital status: Widowed    Spouse name: Not on file   Number of children: 2   Years of education: Not on file   Highest education level: Not on file  Occupational History   Occupation: accountant  Tobacco Use   Smoking status: Former    Packs/day: 1.00    Years: 41.00    Total pack years: 41.00    Types: Cigarettes   Smokeless tobacco: Never  Vaping Use  Vaping Use: Never used  Substance and Sexual Activity   Alcohol use: Not Currently    Alcohol/week: 3.0 standard drinks of alcohol    Types: 3 Standard drinks or equivalent per week    Comment: 3/week   Drug use: No   Sexual activity: Not Currently    Partners: Male  Other Topics Concern   Not on file  Social History Narrative   Not on file   Social Determinants of Health   Financial Resource Strain: Low Risk  (11/19/2018)   Overall Financial Resource Strain (CARDIA)    Difficulty of Paying Living Expenses: Not hard at all  Food Insecurity: No Food Insecurity (11/19/2018)   Hunger Vital Sign    Worried About Running Out of Food in the Last Year: Never true    Ran Out of Food in the Last Year: Never true  Transportation Needs: No Transportation Needs (11/19/2018)   PRAPARE  - Hydrologist (Medical): No    Lack of Transportation (Non-Medical): No  Physical Activity: Inactive (11/19/2018)   Exercise Vital Sign    Days of Exercise per Week: 0 days    Minutes of Exercise per Session: 0 min  Stress: No Stress Concern Present (11/19/2018)   [place]    Feeling of Stress : Only a little  Social Connections: Moderately Isolated (11/19/2018)   Social Connection and Isolation Panel [NHANES]    Frequency of Communication with Friends and Family: Never    Frequency of Social Gatherings with Friends and Family: Never    Attends Religious Services: More than 4 times per year    Active Member of [employer] or Organizations: No    Attends Archivist Meetings: Never    Marital Status: Widowed     Family History: The patient's family history includes Asthma in her mother; Diabetes in her mother; Heart disease in her father; Hypertension in her father.  ROS:   Please see the history of present illness.     All other systems reviewed and are negative.  EKGs/Labs/Other Studies Reviewed:    The following studies were reviewed today:   EKG:  EKG is  ordered today.  The ekg ordered today demonstrates   01/06/2022-Sinus tachycardia 105 bpm  Recent Labs: 12/29/2021: ALT 30; Hemoglobin 15.1; Platelets 528.0; TSH 0.92 02/04/2022: BUN 13; Creatinine, Ser 0.92; Potassium 4.8; Sodium 135   Recent Lipid Panel    Component Value Date/Time   CHOL 118 02/04/2022 0818   TRIG 69 02/04/2022 0818   HDL 38 (L) 02/04/2022 0818   CHOLHDL 3.1 02/04/2022 0818   CHOLHDL 4 12/29/2021 0924   VLDL 21.6 12/29/2021 0924   LDLCALC 66 02/04/2022 0818     Risk Assessment/Calculations:           Physical Exam:    VS:  BP 118/64   Pulse 95   Ht '5\' 2"'$  (1.575 m)   Wt 137 lb 3.2 oz (62.2 kg)   BMI 25.09 kg/m     Wt Readings from Last 3 Encounters:  02/16/22 137 lb 3.2 oz (62.2 kg)   01/12/22 128 lb (58.1 kg)  12/29/21 129 lb (58.5 kg)     GEN:  Well nourished, well developed in no acute distress HEENT: Normal NECK: No JVD; No carotid bruits LYMPHATICS: No lymphadenopathy CARDIAC: RRR, no murmurs, rubs, gallops RESPIRATORY:  Clear to auscultation without rales, wheezing or rhonchi  ABDOMEN: Soft, non-tender, non-distended MUSCULOSKELETAL:  edema L  arm, pitting edema BL feet to thighs SKIN: Warm and dry NEUROLOGIC:  Alert and oriented x 3 PSYCHIATRIC:  Normal affect   ASSESSMENT:    Abnormal EKG: no signs of ACS. She has no known hx of MI. She is asymptomatic without signs of CHF. Poor r wave progression does not always signify MI.  Edema: Ddx includes norvasc related vs CHF vs low protein vs lymphatic obstruction. She has anasarca with LE edema and L arm swelling. She does not have signs of decompensated CHF. Will get BNP and TTE to ensure. Will stop norvasc all together since it is associated with swelling. Her albumin in June was normal, this is not related. No signs of nephrotic syndrome; unlikely Lupus nephritis. She's had prior colonoscopy 2012 that was normal. Cologaurd was negative 12/29/2021  CVD risk mitigation:   HLD: continue lipitor 40 mg daily. A1c goal < 7. LDL 127 mg/dL; goal < 100 mg/dL, LDL 66 at goal  A1c: noted to be up to 13; and she is not monitoring; will send message to her primary provider  HTN: changed amlodipine 10- benazapril 20 mg daily ( she was also prescribed amlodipine 5; stopped this) to amlodipine 10 mg- olmesartan 40 mg daily and added chlorthalidone 25 mg daily. Will stop norvasc all together. Will continue olmesartan 40 mg. Bps well controlled  Smoking: quit, encouraging to continue    PLAN:    In order of problems listed above:   Start lasix 40 mg daily BNP Stop norvasc-olmesartan TTE       Medication Adjustments/Labs and Tests Ordered: Current medicines are reviewed at length with the patient today.  Concerns  regarding medicines are outlined above.  Orders Placed This Encounter  Procedures   Brain natriuretic peptide   ECHOCARDIOGRAM COMPLETE   Meds ordered this encounter  Medications   DISCONTD: olmesartan (BENICAR) 40 MG tablet    Sig: Take 1 tablet (40 mg total) by mouth daily.    Dispense:  30 tablet    Refill:  2   olmesartan (BENICAR) 40 MG tablet    Sig: Take 1 tablet (40 mg total) by mouth daily.    Dispense:  30 tablet    Refill:  2   furosemide (LASIX) 40 MG tablet    Sig: Take 1 tablet (40 mg total) by mouth daily.    Dispense:  90 tablet    Refill:  3    Patient Instructions  Medication Instructions:  -STOP Amlodipine-Olmesartan (Azor)  -STOP aspirin  -START Olmesartan (Benicar) '40mg'$  once daily  -START Furosimide (Lasix) 40 mg once daily  *If you need a refill on your cardiac medications before your next appointment, please call your pharmacy*  Lab Work: Your physician recommends that you have labs drawn today:  BNP   If you have labs (blood work) drawn today and your tests are completely normal, you will receive your results only by: [place] (if you have MyChart) OR A paper copy in the mail If you have any lab test that is abnormal or we need to change your treatment, we will call you to review the results.   Testing/Procedures: Your physician has requested that you have an echocardiogram. Echocardiography is a painless test that uses sound waves to create images of your heart. It provides your doctor with information about the size and shape of your heart and how well your heart's chambers and valves are working. This procedure takes approximately one hour. There are no restrictions for this procedure. This procedure  will be done at [address]   Follow-Up: At [facility], you and your health needs are our priority.  As part of our continuing mission to provide you with exceptional heart care, we have created designated Provider Care  Teams.  These Care Teams include your primary Cardiologist (physician) and Advanced Practice Providers (APPs -  Physician Assistants and Nurse Practitioners) who all work together to provide you with the care you need, when you need it.  We recommend signing up for the patient portal called "MyChart".  Sign up information is provided on this After Visit Summary.  MyChart is used to connect with patients for Virtual Visits (Telemedicine).  Patients are able to view lab/test results, encounter notes, upcoming appointments, etc.  Non-urgent messages can be sent to your provider as well.   To learn more about what you can do with MyChart, go to [email or web address].    Your next appointment:   1 month(s)  The format for your next appointment:   In Person  Provider:   Janina Mayo, MD     Signed, Janina Mayo, MD  02/16/2022 10:18 AM    [place]

## 2022-02-16 ENCOUNTER — Ambulatory Visit: Payer: BC Managed Care – PPO | Admitting: Internal Medicine

## 2022-02-16 ENCOUNTER — Encounter: Payer: Self-pay | Admitting: Internal Medicine

## 2022-02-16 VITALS — BP 118/64 | HR 95 | Ht 62.0 in | Wt 137.2 lb

## 2022-02-16 DIAGNOSIS — R609 Edema, unspecified: Secondary | ICD-10-CM

## 2022-02-16 MED ORDER — OLMESARTAN MEDOXOMIL 40 MG PO TABS: 40.0000 mg | ORAL_TABLET | Freq: Every day | ORAL | 2 refills | Status: DC

## 2022-02-16 MED ORDER — FUROSEMIDE 40 MG PO TABS: 40.0000 mg | ORAL_TABLET | Freq: Every day | ORAL | 3 refills | Status: DC

## 2022-02-16 NOTE — Telephone Encounter (Signed)
Patient was seen today 02/16/22 in clinic by Dr. Harl Bowie.

## 2022-02-16 NOTE — Patient Instructions (Addendum)
Medication Instructions:  -STOP Amlodipine-Olmesartan (Azor)  -STOP aspirin  -START Olmesartan (Benicar) '40mg'$  once daily  -START Furosimide (Lasix) 40 mg once daily  *If you need a refill on your cardiac medications before your next appointment, please call your pharmacy*  Lab Work: Your physician recommends that you have labs drawn today:  BNP   If you have labs (blood work) drawn today and your tests are completely normal, you will receive your results only by: [place] (if you have MyChart) OR A paper copy in the mail If you have any lab test that is abnormal or we need to change your treatment, we will call you to review the results.   Testing/Procedures: Your physician has requested that you have an echocardiogram. Echocardiography is a painless test that uses sound waves to create images of your heart. It provides your doctor with information about the size and shape of your heart and how well your heart's chambers and valves are working. This procedure takes approximately one hour. There are no restrictions for this procedure. This procedure will be done at [address]   Follow-Up: At [facility], you and your health needs are our priority.  As part of our continuing mission to provide you with exceptional heart care, we have created designated Provider Care Teams.  These Care Teams include your primary Cardiologist (physician) and Advanced Practice Providers (APPs -  Physician Assistants and Nurse Practitioners) who all work together to provide you with the care you need, when you need it.  We recommend signing up for the patient portal called "MyChart".  Sign up information is provided on this After Visit Summary.  MyChart is used to connect with patients for Virtual Visits (Telemedicine).  Patients are able to view lab/test results, encounter notes, upcoming appointments, etc.  Non-urgent messages can be sent to your provider as well.   To learn more  about what you can do with MyChart, go to [email or web address].    Your next appointment:   1 month(s)  The format for your next appointment:   In Person  Provider:   Janina Mayo, MD

## 2022-02-17 LAB — BRAIN NATRIURETIC PEPTIDE: BNP: 14.7 pg/mL (ref 0.0–100.0)

## 2022-02-18 ENCOUNTER — Encounter (HOSPITAL_BASED_OUTPATIENT_CLINIC_OR_DEPARTMENT_OTHER): Payer: Self-pay

## 2022-02-18 ENCOUNTER — Ambulatory Visit (HOSPITAL_BASED_OUTPATIENT_CLINIC_OR_DEPARTMENT_OTHER): Payer: BC Managed Care – PPO | Admitting: Family

## 2022-02-24 DIAGNOSIS — R42 Dizziness and giddiness: Secondary | ICD-10-CM | POA: Diagnosis not present

## 2022-02-24 DIAGNOSIS — R1084 Generalized abdominal pain: Secondary | ICD-10-CM | POA: Diagnosis not present

## 2022-02-24 DIAGNOSIS — R109 Unspecified abdominal pain: Secondary | ICD-10-CM | POA: Diagnosis not present

## 2022-02-24 DIAGNOSIS — R609 Edema, unspecified: Secondary | ICD-10-CM | POA: Diagnosis not present

## 2022-02-24 DIAGNOSIS — R1111 Vomiting without nausea: Secondary | ICD-10-CM | POA: Diagnosis not present

## 2022-02-24 DIAGNOSIS — R1013 Epigastric pain: Secondary | ICD-10-CM | POA: Diagnosis not present

## 2022-02-24 DIAGNOSIS — R1010 Upper abdominal pain, unspecified: Secondary | ICD-10-CM | POA: Diagnosis not present

## 2022-02-25 DIAGNOSIS — R739 Hyperglycemia, unspecified: Secondary | ICD-10-CM | POA: Diagnosis not present

## 2022-02-25 DIAGNOSIS — M7989 Other specified soft tissue disorders: Secondary | ICD-10-CM | POA: Diagnosis not present

## 2022-02-25 DIAGNOSIS — E7800 Pure hypercholesterolemia, unspecified: Secondary | ICD-10-CM | POA: Diagnosis not present

## 2022-02-25 DIAGNOSIS — J90 Pleural effusion, not elsewhere classified: Secondary | ICD-10-CM | POA: Diagnosis not present

## 2022-02-25 DIAGNOSIS — I10 Essential (primary) hypertension: Secondary | ICD-10-CM | POA: Diagnosis not present

## 2022-02-25 DIAGNOSIS — Z6824 Body mass index (BMI) 24.0-24.9, adult: Secondary | ICD-10-CM | POA: Diagnosis not present

## 2022-03-02 DIAGNOSIS — M7989 Other specified soft tissue disorders: Secondary | ICD-10-CM | POA: Diagnosis not present

## 2022-03-03 DIAGNOSIS — M7989 Other specified soft tissue disorders: Secondary | ICD-10-CM | POA: Diagnosis not present

## 2022-03-04 DIAGNOSIS — E7800 Pure hypercholesterolemia, unspecified: Secondary | ICD-10-CM | POA: Diagnosis not present

## 2022-03-04 DIAGNOSIS — M129 Arthropathy, unspecified: Secondary | ICD-10-CM | POA: Diagnosis not present

## 2022-03-04 DIAGNOSIS — Z20822 Contact with and (suspected) exposure to covid-19: Secondary | ICD-10-CM | POA: Diagnosis not present

## 2022-03-04 DIAGNOSIS — J90 Pleural effusion, not elsewhere classified: Secondary | ICD-10-CM | POA: Diagnosis not present

## 2022-03-04 DIAGNOSIS — R1907 Generalized intra-abdominal and pelvic swelling, mass and lump: Secondary | ICD-10-CM | POA: Diagnosis not present

## 2022-03-04 DIAGNOSIS — D539 Nutritional anemia, unspecified: Secondary | ICD-10-CM | POA: Diagnosis not present

## 2022-03-04 DIAGNOSIS — Z6825 Body mass index (BMI) 25.0-25.9, adult: Secondary | ICD-10-CM | POA: Diagnosis not present

## 2022-03-04 DIAGNOSIS — I10 Essential (primary) hypertension: Secondary | ICD-10-CM | POA: Diagnosis not present

## 2022-03-04 DIAGNOSIS — E559 Vitamin D deficiency, unspecified: Secondary | ICD-10-CM | POA: Diagnosis not present

## 2022-03-04 DIAGNOSIS — M7989 Other specified soft tissue disorders: Secondary | ICD-10-CM | POA: Diagnosis not present

## 2022-03-04 DIAGNOSIS — Z131 Encounter for screening for diabetes mellitus: Secondary | ICD-10-CM | POA: Diagnosis not present

## 2022-03-04 DIAGNOSIS — Z1159 Encounter for screening for other viral diseases: Secondary | ICD-10-CM | POA: Diagnosis not present

## 2022-03-04 DIAGNOSIS — K2900 Acute gastritis without bleeding: Secondary | ICD-10-CM | POA: Diagnosis not present

## 2022-03-09 DIAGNOSIS — Z20822 Contact with and (suspected) exposure to covid-19: Secondary | ICD-10-CM | POA: Diagnosis not present

## 2022-03-09 DIAGNOSIS — E7800 Pure hypercholesterolemia, unspecified: Secondary | ICD-10-CM | POA: Diagnosis not present

## 2022-03-09 DIAGNOSIS — D599 Acquired hemolytic anemia, unspecified: Secondary | ICD-10-CM | POA: Diagnosis not present

## 2022-03-09 DIAGNOSIS — M129 Arthropathy, unspecified: Secondary | ICD-10-CM | POA: Diagnosis not present

## 2022-03-09 DIAGNOSIS — E559 Vitamin D deficiency, unspecified: Secondary | ICD-10-CM | POA: Diagnosis not present

## 2022-03-09 DIAGNOSIS — Z1159 Encounter for screening for other viral diseases: Secondary | ICD-10-CM | POA: Diagnosis not present

## 2022-03-09 DIAGNOSIS — I10 Essential (primary) hypertension: Secondary | ICD-10-CM | POA: Diagnosis not present

## 2022-03-12 DIAGNOSIS — R109 Unspecified abdominal pain: Secondary | ICD-10-CM | POA: Diagnosis not present

## 2022-03-12 DIAGNOSIS — Z6825 Body mass index (BMI) 25.0-25.9, adult: Secondary | ICD-10-CM | POA: Diagnosis not present

## 2022-03-12 DIAGNOSIS — K263 Acute duodenal ulcer without hemorrhage or perforation: Secondary | ICD-10-CM | POA: Diagnosis not present

## 2022-03-12 DIAGNOSIS — K2900 Acute gastritis without bleeding: Secondary | ICD-10-CM | POA: Diagnosis not present

## 2022-03-12 DIAGNOSIS — R1907 Generalized intra-abdominal and pelvic swelling, mass and lump: Secondary | ICD-10-CM | POA: Diagnosis not present

## 2022-03-14 DIAGNOSIS — R768 Other specified abnormal immunological findings in serum: Secondary | ICD-10-CM | POA: Diagnosis not present

## 2022-03-14 DIAGNOSIS — M0579 Rheumatoid arthritis with rheumatoid factor of multiple sites without organ or systems involvement: Secondary | ICD-10-CM | POA: Diagnosis not present

## 2022-03-14 DIAGNOSIS — M19041 Primary osteoarthritis, right hand: Secondary | ICD-10-CM | POA: Diagnosis not present

## 2022-03-14 DIAGNOSIS — I7300 Raynaud's syndrome without gangrene: Secondary | ICD-10-CM | POA: Diagnosis not present

## 2022-03-15 ENCOUNTER — Ambulatory Visit (HOSPITAL_BASED_OUTPATIENT_CLINIC_OR_DEPARTMENT_OTHER): Payer: BC Managed Care – PPO

## 2022-03-15 DIAGNOSIS — K6389 Other specified diseases of intestine: Secondary | ICD-10-CM | POA: Diagnosis not present

## 2022-03-15 DIAGNOSIS — E119 Type 2 diabetes mellitus without complications: Secondary | ICD-10-CM | POA: Diagnosis not present

## 2022-03-15 DIAGNOSIS — R42 Dizziness and giddiness: Secondary | ICD-10-CM | POA: Diagnosis not present

## 2022-03-15 DIAGNOSIS — Z79899 Other long term (current) drug therapy: Secondary | ICD-10-CM | POA: Diagnosis not present

## 2022-03-15 DIAGNOSIS — N179 Acute kidney failure, unspecified: Secondary | ICD-10-CM | POA: Diagnosis not present

## 2022-03-15 DIAGNOSIS — R1013 Epigastric pain: Secondary | ICD-10-CM | POA: Diagnosis not present

## 2022-03-15 DIAGNOSIS — I10 Essential (primary) hypertension: Secondary | ICD-10-CM | POA: Diagnosis not present

## 2022-03-15 DIAGNOSIS — Z87891 Personal history of nicotine dependence: Secondary | ICD-10-CM | POA: Diagnosis not present

## 2022-03-15 DIAGNOSIS — R109 Unspecified abdominal pain: Secondary | ICD-10-CM | POA: Diagnosis not present

## 2022-03-15 DIAGNOSIS — R609 Edema, unspecified: Secondary | ICD-10-CM | POA: Diagnosis not present

## 2022-03-15 DIAGNOSIS — K3189 Other diseases of stomach and duodenum: Secondary | ICD-10-CM | POA: Diagnosis present

## 2022-03-15 DIAGNOSIS — D75839 Thrombocytosis, unspecified: Secondary | ICD-10-CM | POA: Diagnosis not present

## 2022-03-15 DIAGNOSIS — D649 Anemia, unspecified: Secondary | ICD-10-CM | POA: Diagnosis not present

## 2022-03-15 DIAGNOSIS — R601 Generalized edema: Secondary | ICD-10-CM

## 2022-03-15 DIAGNOSIS — J90 Pleural effusion, not elsewhere classified: Secondary | ICD-10-CM | POA: Diagnosis not present

## 2022-03-15 DIAGNOSIS — Z833 Family history of diabetes mellitus: Secondary | ICD-10-CM | POA: Diagnosis not present

## 2022-03-15 DIAGNOSIS — Z7984 Long term (current) use of oral hypoglycemic drugs: Secondary | ICD-10-CM | POA: Diagnosis not present

## 2022-03-15 DIAGNOSIS — M79604 Pain in right leg: Secondary | ICD-10-CM | POA: Diagnosis not present

## 2022-03-15 DIAGNOSIS — M06072 Rheumatoid arthritis without rheumatoid factor, left ankle and foot: Secondary | ICD-10-CM | POA: Diagnosis not present

## 2022-03-15 DIAGNOSIS — E1122 Type 2 diabetes mellitus with diabetic chronic kidney disease: Secondary | ICD-10-CM | POA: Diagnosis not present

## 2022-03-15 DIAGNOSIS — M06071 Rheumatoid arthritis without rheumatoid factor, right ankle and foot: Secondary | ICD-10-CM | POA: Diagnosis not present

## 2022-03-15 DIAGNOSIS — E876 Hypokalemia: Secondary | ICD-10-CM | POA: Diagnosis not present

## 2022-03-15 DIAGNOSIS — Z885 Allergy status to narcotic agent status: Secondary | ICD-10-CM | POA: Diagnosis not present

## 2022-03-15 DIAGNOSIS — E278 Other specified disorders of adrenal gland: Secondary | ICD-10-CM | POA: Diagnosis not present

## 2022-03-15 DIAGNOSIS — Z8249 Family history of ischemic heart disease and other diseases of the circulatory system: Secondary | ICD-10-CM | POA: Diagnosis not present

## 2022-03-15 DIAGNOSIS — I3139 Other pericardial effusion (noninflammatory): Secondary | ICD-10-CM | POA: Diagnosis not present

## 2022-03-15 DIAGNOSIS — E871 Hypo-osmolality and hyponatremia: Secondary | ICD-10-CM | POA: Diagnosis not present

## 2022-03-15 DIAGNOSIS — Z90710 Acquired absence of both cervix and uterus: Secondary | ICD-10-CM | POA: Diagnosis not present

## 2022-03-15 DIAGNOSIS — M7989 Other specified soft tissue disorders: Secondary | ICD-10-CM | POA: Diagnosis not present

## 2022-03-15 DIAGNOSIS — E785 Hyperlipidemia, unspecified: Secondary | ICD-10-CM | POA: Diagnosis not present

## 2022-03-15 DIAGNOSIS — M79605 Pain in left leg: Secondary | ICD-10-CM | POA: Diagnosis not present

## 2022-03-15 DIAGNOSIS — D3500 Benign neoplasm of unspecified adrenal gland: Secondary | ICD-10-CM | POA: Diagnosis not present

## 2022-03-15 DIAGNOSIS — M069 Rheumatoid arthritis, unspecified: Secondary | ICD-10-CM | POA: Diagnosis not present

## 2022-03-15 DIAGNOSIS — Z825 Family history of asthma and other chronic lower respiratory diseases: Secondary | ICD-10-CM | POA: Diagnosis not present

## 2022-03-15 LAB — ECHOCARDIOGRAM COMPLETE
Area-P 1/2: 3.37 cm2
S' Lateral: 1.9 cm

## 2022-03-16 ENCOUNTER — Telehealth: Payer: Self-pay | Admitting: *Deleted

## 2022-03-16 DIAGNOSIS — R931 Abnormal findings on diagnostic imaging of heart and coronary circulation: Secondary | ICD-10-CM

## 2022-03-16 NOTE — Telephone Encounter (Signed)
-----   Message from Janina Mayo, MD sent at 03/16/2022  8:23 AM EDT ----- Jacqualine Mau, please let Mrs Teas know that her echo showed normal heart function. There is some fluid around the heart but not causing an issue. There is one portion that is not visualized well and I recommend a chest CT with and without contrast to get more information.

## 2022-03-17 ENCOUNTER — Inpatient Hospital Stay (HOSPITAL_BASED_OUTPATIENT_CLINIC_OR_DEPARTMENT_OTHER)
Admission: EM | Admit: 2022-03-17 | Discharge: 2022-03-28 | DRG: 683 | Disposition: A | Discharge status: Home Health | Payer: BC Managed Care – PPO | Attending: Internal Medicine | Admitting: Internal Medicine

## 2022-03-17 ENCOUNTER — Other Ambulatory Visit: Payer: Self-pay

## 2022-03-17 ENCOUNTER — Encounter (HOSPITAL_COMMUNITY): Payer: Self-pay

## 2022-03-17 ENCOUNTER — Emergency Department (HOSPITAL_BASED_OUTPATIENT_CLINIC_OR_DEPARTMENT_OTHER): Payer: BC Managed Care – PPO

## 2022-03-17 ENCOUNTER — Encounter (HOSPITAL_BASED_OUTPATIENT_CLINIC_OR_DEPARTMENT_OTHER): Payer: Self-pay

## 2022-03-17 DIAGNOSIS — Z79899 Other long term (current) drug therapy: Secondary | ICD-10-CM | POA: Diagnosis not present

## 2022-03-17 DIAGNOSIS — M06072 Rheumatoid arthritis without rheumatoid factor, left ankle and foot: Secondary | ICD-10-CM | POA: Diagnosis present

## 2022-03-17 DIAGNOSIS — D75839 Thrombocytosis, unspecified: Secondary | ICD-10-CM | POA: Diagnosis not present

## 2022-03-17 DIAGNOSIS — Z7984 Long term (current) use of oral hypoglycemic drugs: Secondary | ICD-10-CM | POA: Diagnosis not present

## 2022-03-17 DIAGNOSIS — Z885 Allergy status to narcotic agent status: Secondary | ICD-10-CM | POA: Diagnosis not present

## 2022-03-17 DIAGNOSIS — Z87891 Personal history of nicotine dependence: Secondary | ICD-10-CM | POA: Diagnosis not present

## 2022-03-17 DIAGNOSIS — I10 Essential (primary) hypertension: Secondary | ICD-10-CM | POA: Diagnosis present

## 2022-03-17 DIAGNOSIS — D649 Anemia, unspecified: Secondary | ICD-10-CM | POA: Diagnosis not present

## 2022-03-17 DIAGNOSIS — M06071 Rheumatoid arthritis without rheumatoid factor, right ankle and foot: Secondary | ICD-10-CM | POA: Diagnosis not present

## 2022-03-17 DIAGNOSIS — R109 Unspecified abdominal pain: Secondary | ICD-10-CM | POA: Diagnosis not present

## 2022-03-17 DIAGNOSIS — K3189 Other diseases of stomach and duodenum: Secondary | ICD-10-CM | POA: Diagnosis present

## 2022-03-17 DIAGNOSIS — Z825 Family history of asthma and other chronic lower respiratory diseases: Secondary | ICD-10-CM | POA: Diagnosis not present

## 2022-03-17 DIAGNOSIS — J90 Pleural effusion, not elsewhere classified: Secondary | ICD-10-CM | POA: Diagnosis not present

## 2022-03-17 DIAGNOSIS — N179 Acute kidney failure, unspecified: Secondary | ICD-10-CM | POA: Diagnosis not present

## 2022-03-17 DIAGNOSIS — I3139 Other pericardial effusion (noninflammatory): Secondary | ICD-10-CM | POA: Diagnosis present

## 2022-03-17 DIAGNOSIS — E871 Hypo-osmolality and hyponatremia: Secondary | ICD-10-CM | POA: Diagnosis not present

## 2022-03-17 DIAGNOSIS — R601 Generalized edema: Secondary | ICD-10-CM

## 2022-03-17 DIAGNOSIS — E785 Hyperlipidemia, unspecified: Secondary | ICD-10-CM | POA: Diagnosis not present

## 2022-03-17 DIAGNOSIS — Z833 Family history of diabetes mellitus: Secondary | ICD-10-CM | POA: Diagnosis not present

## 2022-03-17 DIAGNOSIS — Z90710 Acquired absence of both cervix and uterus: Secondary | ICD-10-CM

## 2022-03-17 DIAGNOSIS — M069 Rheumatoid arthritis, unspecified: Secondary | ICD-10-CM | POA: Diagnosis present

## 2022-03-17 DIAGNOSIS — E1122 Type 2 diabetes mellitus with diabetic chronic kidney disease: Secondary | ICD-10-CM | POA: Diagnosis present

## 2022-03-17 DIAGNOSIS — E876 Hypokalemia: Secondary | ICD-10-CM | POA: Diagnosis not present

## 2022-03-17 DIAGNOSIS — R42 Dizziness and giddiness: Secondary | ICD-10-CM

## 2022-03-17 DIAGNOSIS — Z8249 Family history of ischemic heart disease and other diseases of the circulatory system: Secondary | ICD-10-CM

## 2022-03-17 DIAGNOSIS — D3500 Benign neoplasm of unspecified adrenal gland: Secondary | ICD-10-CM | POA: Diagnosis not present

## 2022-03-17 DIAGNOSIS — R1013 Epigastric pain: Secondary | ICD-10-CM

## 2022-03-17 LAB — CBC WITH DIFFERENTIAL/PLATELET
Abs Immature Granulocytes: 0.03 10*3/uL (ref 0.00–0.07)
Basophils Absolute: 0 10*3/uL (ref 0.0–0.1)
Basophils Relative: 0 %
Eosinophils Absolute: 0.1 10*3/uL (ref 0.0–0.5)
Eosinophils Relative: 1 %
HCT: 37 % (ref 36.0–46.0)
Hemoglobin: 12.9 g/dL (ref 12.0–15.0)
Immature Granulocytes: 0 %
Lymphocytes Relative: 10 %
Lymphs Abs: 0.8 10*3/uL (ref 0.7–4.0)
MCH: 31.9 pg (ref 26.0–34.0)
MCHC: 34.9 g/dL (ref 30.0–36.0)
MCV: 91.4 fL (ref 80.0–100.0)
Monocytes Absolute: 0.4 10*3/uL (ref 0.1–1.0)
Monocytes Relative: 5 %
Neutro Abs: 6.5 10*3/uL (ref 1.7–7.7)
Neutrophils Relative %: 84 %
Platelets: 437 10*3/uL — ABNORMAL HIGH (ref 150–400)
RBC: 4.05 MIL/uL (ref 3.87–5.11)
RDW: 14.6 % (ref 11.5–15.5)
WBC: 7.7 10*3/uL (ref 4.0–10.5)
nRBC: 0 % (ref 0.0–0.2)

## 2022-03-17 LAB — URINALYSIS, ROUTINE W REFLEX MICROSCOPIC
Bilirubin Urine: NEGATIVE
Glucose, UA: NEGATIVE mg/dL
Hgb urine dipstick: NEGATIVE
Ketones, ur: 5 mg/dL — AB
Nitrite: NEGATIVE
Protein, ur: NEGATIVE mg/dL
Specific Gravity, Urine: 1.008 (ref 1.005–1.030)
pH: 5 (ref 5.0–8.0)

## 2022-03-17 LAB — COMPREHENSIVE METABOLIC PANEL
ALT: 17 U/L (ref 0–44)
AST: 24 U/L (ref 15–41)
Albumin: 3.6 g/dL (ref 3.5–5.0)
Alkaline Phosphatase: 52 U/L (ref 38–126)
Anion gap: 17 — ABNORMAL HIGH (ref 5–15)
BUN: 37 mg/dL — ABNORMAL HIGH (ref 8–23)
CO2: 22 mmol/L (ref 22–32)
Calcium: 9.4 mg/dL (ref 8.9–10.3)
Chloride: 93 mmol/L — ABNORMAL LOW (ref 98–111)
Creatinine, Ser: 4.05 mg/dL — ABNORMAL HIGH (ref 0.44–1.00)
GFR, Estimated: 12 mL/min — ABNORMAL LOW (ref >60)
Glucose, Bld: 126 mg/dL — ABNORMAL HIGH (ref 70–99)
Potassium: 4.3 mmol/L (ref 3.5–5.1)
Sodium: 132 mmol/L — ABNORMAL LOW (ref 135–145)
Total Bilirubin: 0.8 mg/dL (ref 0.3–1.2)
Total Protein: 6.9 g/dL (ref 6.5–8.1)

## 2022-03-17 LAB — BRAIN NATRIURETIC PEPTIDE: B Natriuretic Peptide: 25.6 pg/mL (ref 0.0–100.0)

## 2022-03-17 LAB — GLUCOSE, CAPILLARY
Glucose-Capillary: <10 mg/dL — CL (ref 70–99)
Glucose-Capillary: 73 mg/dL (ref 70–99)

## 2022-03-17 LAB — TROPONIN I (HIGH SENSITIVITY): Troponin I (High Sensitivity): 9 ng/L (ref <18)

## 2022-03-17 LAB — PROTEIN / CREATININE RATIO, URINE
Creatinine, Urine: 88 mg/dL
Protein Creatinine Ratio: 0.22 mg/mg{Cre} — ABNORMAL HIGH (ref 0.00–0.15)
Total Protein, Urine: 19 mg/dL

## 2022-03-17 LAB — LIPASE, BLOOD: Lipase: 28 U/L (ref 11–51)

## 2022-03-17 MED ORDER — BUPROPION HCL ER (SR) 150 MG PO TB12: 150.0000 mg | ORAL_TABLET | Freq: Two times a day (BID) | ORAL | Status: DC

## 2022-03-17 MED ORDER — FENTANYL CITRATE PF 50 MCG/ML IJ SOSY
25.0000 ug | PREFILLED_SYRINGE | INTRAMUSCULAR | Status: DC | PRN
  Administered 2022-03-17 – 2022-03-26 (×20): 25 ug via INTRAVENOUS
  Filled 2022-03-17 (×20): qty 1

## 2022-03-17 MED ORDER — ONDANSETRON HCL 4 MG PO TABS
4.0000 mg | ORAL_TABLET | Freq: Four times a day (QID) | ORAL | Status: DC | PRN
  Administered 2022-03-18 – 2022-03-21 (×2): 4 mg via ORAL
  Filled 2022-03-17 (×2): qty 1

## 2022-03-17 MED ORDER — ACETAMINOPHEN 325 MG PO TABS
650.0000 mg | ORAL_TABLET | Freq: Four times a day (QID) | ORAL | Status: DC | PRN
  Administered 2022-03-18 – 2022-03-22 (×6): 650 mg via ORAL
  Filled 2022-03-17 (×6): qty 2

## 2022-03-17 MED ORDER — PANTOPRAZOLE SODIUM 40 MG PO TBEC
40.0000 mg | DELAYED_RELEASE_TABLET | Freq: Every day | ORAL | Status: DC
  Administered 2022-03-17 – 2022-03-28 (×12): 40 mg via ORAL
  Filled 2022-03-17 (×12): qty 1

## 2022-03-17 MED ORDER — OXYCODONE HCL 5 MG PO TABS
5.0000 mg | ORAL_TABLET | ORAL | Status: DC | PRN
  Administered 2022-03-17 – 2022-03-28 (×28): 5 mg via ORAL
  Filled 2022-03-17 (×28): qty 1

## 2022-03-17 MED ORDER — FOLIC ACID 1 MG PO TABS
1.0000 mg | ORAL_TABLET | Freq: Every day | ORAL | Status: DC
  Administered 2022-03-17 – 2022-03-28 (×12): 1 mg via ORAL
  Filled 2022-03-17 (×12): qty 1

## 2022-03-17 MED ORDER — FENTANYL CITRATE PF 50 MCG/ML IJ SOSY
50.0000 ug | PREFILLED_SYRINGE | Freq: Once | INTRAMUSCULAR | Status: AC
  Administered 2022-03-17: 50 ug via INTRAVENOUS
  Filled 2022-03-17: qty 1

## 2022-03-17 MED ORDER — SUCRALFATE 1 GM/10ML PO SUSP
1.0000 g | Freq: Three times a day (TID) | ORAL | Status: AC
  Administered 2022-03-17 – 2022-03-20 (×13): 1 g via ORAL
  Filled 2022-03-17 (×13): qty 10

## 2022-03-17 MED ORDER — HYDROCODONE-ACETAMINOPHEN 5-325 MG PO TABS
1.0000 | ORAL_TABLET | Freq: Once | ORAL | Status: AC
  Administered 2022-03-17: 1 via ORAL
  Filled 2022-03-17: qty 1

## 2022-03-17 MED ORDER — FUROSEMIDE 10 MG/ML IJ SOLN
80.0000 mg | Freq: Two times a day (BID) | INTRAMUSCULAR | Status: AC
  Administered 2022-03-17 – 2022-03-18 (×2): 80 mg via INTRAVENOUS
  Filled 2022-03-17 (×2): qty 8

## 2022-03-17 MED ORDER — ONDANSETRON HCL 4 MG/2ML IJ SOLN
4.0000 mg | Freq: Four times a day (QID) | INTRAMUSCULAR | Status: DC | PRN
  Administered 2022-03-18 – 2022-03-20 (×3): 4 mg via INTRAVENOUS
  Filled 2022-03-17 (×3): qty 2

## 2022-03-17 MED ORDER — HYDRALAZINE HCL 20 MG/ML IJ SOLN: 10.0000 mg | Freq: Four times a day (QID) | INTRAMUSCULAR | Status: DC | PRN

## 2022-03-17 MED ORDER — ATORVASTATIN CALCIUM 40 MG PO TABS
40.0000 mg | ORAL_TABLET | Freq: Every day | ORAL | Status: DC
  Administered 2022-03-17 – 2022-03-28 (×12): 40 mg via ORAL
  Filled 2022-03-17 (×12): qty 1

## 2022-03-17 MED ORDER — ACETAMINOPHEN 650 MG RE SUPP: 650.0000 mg | Freq: Four times a day (QID) | RECTAL | Status: DC | PRN

## 2022-03-17 MED ORDER — ADULT MULTIVITAMIN W/MINERALS CH
1.0000 | ORAL_TABLET | Freq: Every day | ORAL | Status: DC
  Administered 2022-03-17 – 2022-03-28 (×12): 1 via ORAL
  Filled 2022-03-17 (×12): qty 1

## 2022-03-17 MED ORDER — HEPARIN SODIUM (PORCINE) 5000 UNIT/ML IJ SOLN
5000.0000 [IU] | Freq: Three times a day (TID) | INTRAMUSCULAR | Status: DC
  Administered 2022-03-17 – 2022-03-28 (×29): 5000 [IU] via SUBCUTANEOUS
  Filled 2022-03-17 (×29): qty 1

## 2022-03-17 NOTE — H&P (Signed)
History and Physical    Patient: Alyssa Rhodes:[id number] DOB: [birth year] DOA: 03/17/2022 DOS: the patient was seen and examined on 03/17/2022 PCP: Doretha Sou, MD  Patient coming from: Home  Chief Complaint:  Chief Complaint  Patient presents with   Abdominal Pain   Dizziness   HPI: Alyssa Rhodes is a 61 y.o. female with medical history significant for but not limited to hypertension, uncontrolled diabetes mellitus type 2 with recent hemoglobin A1c of 13.5, dyslipidemia, history of rheumatoid arthritis, history of lupus as well as other comorbidities who presented with swelling of her left upper extremity and bilateral lower extremities and abdomen with epigastric abdominal discomfort and pain that has been going on for several months now.  Patient has a normal baseline creatinine of 0.7-0.9 and states that she has had worsening swelling and pain of her extremities as well as abdomen.  States that she vomited twice and has not really been able to eat anything given her gastric distention and epigastric pain.  She has been taking ibuprofen at least 2-3 times a day due to her pain and has been having some abdominal discomfort and diarrhea with metformin which she stopped because of this and subsequently states that her blood sugars elevated.  Given her lower extremity swelling she was seen by cardiology in outpatient setting who did an echocardiogram which showed a preserved ejection fracture with mild pericardial effusion.  She was initiated on furosemide but did not take this on a consistent basis.  She has been having worsening lower extremity edema and has been following Dr. Amil Amen for rheumatology care and had another medication that she was taken for her lupus.  She notes that she has had worsening abdominal discomfort and distention as well as lower extremity swelling and edema with no appreciable rashes.  She denies any other concerns or complaints at this time.  Given her  persistence in her symptoms and lower extremity pain and abdominal pain she presented to the ED for further evaluation.  [facility] hospitalist was consulted for further evaluation and will admit this patient for AKI with bilateral lower extremity edema and anasarca.   Review of Systems: As mentioned in the history of present illness. All other systems reviewed and are negative. Past Medical History:  Diagnosis Date   Diabetes mellitus without complication ([place])    Hyperlipidemia    Hypertension    Lupus ([place])    Thyroid nodule 03/2010   aspiration   Past Surgical History:  Procedure Laterality Date   ABDOMINAL HYSTERECTOMY     CESAREAN SECTION     SHOULDER SURGERY  2011   LEFT   Social History:  reports that she has quit smoking. Her smoking use included cigarettes. She has a 41.00 pack-year smoking history. She has never used smokeless tobacco. She reports that she does not currently use alcohol after a past usage of about 3.0 standard drinks of alcohol per week. She reports that she does not use drugs.  Allergies  Allergen Reactions   Codeine Nausea And Vomiting   Hydroxychloroquine Rash    Family History  Problem Relation Age of Onset   Asthma Mother        doe not know mother well, sister advised her   Diabetes Mother    Heart disease Father        heart attack   Hypertension Father     Prior to Admission medications   Medication Sig Start Date End Date Taking? Authorizing Provider  Ascorbic Acid (  VITAMIN C PO) Take 1 tablet by mouth daily.   Yes [provider]  atorvastatin (LIPITOR) 40 MG tablet Take 1 tablet (40 mg total) by mouth daily. 12/30/21  Yes Maximiano Coss, NP  dicyclomine (BENTYL) 20 MG tablet Take 10 mg by mouth 3 (three) times daily. 03/12/22  Yes [provider]  estradiol (ESTRACE VAGINAL) 0.1 MG/GM vaginal cream Place 1 g vaginally 3 (three) times a week. 01/12/22  Yes Chrzanowski, Jami B, NP  folic acid (FOLVITE) 1 MG tablet Take 1 mg by  mouth in the morning. 02/27/19  Yes [provider]  Ibuprofen 200 MG CAPS Take 400 mg by mouth every 6 (six) hours as needed (for pain).   Yes [provider]  metFORMIN (GLUCOPHAGE) 500 MG tablet Take 1 tablet (500 mg total) by mouth 2 (two) times daily with a meal. 01/13/22  Yes Maximiano Coss, NP  methotrexate 2.5 MG tablet Take 10 mg by mouth every Wednesday. 02/27/19  Yes [provider]  Multiple Vitamin (MULTIVITAMIN) tablet Take 1 tablet by mouth daily with breakfast.   Yes [provider]  naproxen sodium (ALEVE) 220 MG tablet Take 220-440 mg by mouth 2 (two) times daily as needed (for pain).   Yes [provider]  olmesartan (BENICAR) 40 MG tablet Take 1 tablet (40 mg total) by mouth daily. 02/16/22 05/17/22 Yes BranchRoyetta Crochet, MD  Omega-3 Fatty Acids (FISH OIL PO) Take 1 capsule by mouth in the morning and at bedtime.   Yes [provider]  omeprazole (PRILOSEC) 40 MG capsule Take 40 mg by mouth daily before breakfast. 03/04/22  Yes [provider]  ondansetron (ZOFRAN) 4 MG tablet Take 4 mg by mouth 3 (three) times daily as needed for nausea or vomiting. 03/12/22  Yes [provider]  buPROPion (WELLBUTRIN SR) 150 MG 12 hr tablet Take 1 tablet (150 mg total) by mouth 2 (two) times daily. Patient not taking: Reported on 03/17/2022 06/30/21   Maximiano Coss, NP  chlorthalidone (HYGROTON) 25 MG tablet Take 1 tablet (25 mg total) by mouth daily. Patient not taking: Reported on 03/17/2022 01/06/22   Janina Mayo, MD  furosemide (LASIX) 40 MG tablet Take 1 tablet (40 mg total) by mouth daily. Patient not taking: Reported on 03/17/2022 02/16/22   Janina Mayo, MD    Physical Exam: Vitals:   03/17/22 1330 03/17/22 1500 03/17/22 1656 03/17/22 1715  BP: (!) 162/111 (!) 149/91 (!) 168/92   Pulse: (!) 51   (!) 101  Resp: '15 14 15   '$ Temp:  97.9 F (36.6 C) 97.7 F (36.5 C)   TempSrc:   Oral   SpO2: 100% 100%  100%  Weight:       Height:       Examination: Physical Exam:  Constitutional: WN/WD slightly overweight Respiratory: Diminished to auscultation bilaterally, no wheezing, rales, rhonchi or crackles. Normal respiratory effort and patient is not tachypenic. No accessory muscle use.  Unlabored breathing Cardiovascular: RRR, no murmurs / rubs / gallops. S1 and S2 auscultated.  Has 2+ to 3+ lower extremity pitting edema Abdomen: Soft, tender to palpate and distended with swelling. Bowel sounds positive.  GU: Deferred. Musculoskeletal: No clubbing / cyanosis of digits/nails. No joint deformity upper and lower extremities.  Skin: No rashes, lesions, ulcers on limited skin evaluation. No induration; Warm and dry.  Neurologic: CN 2-12 grossly intact with no focal deficits. Romberg sign and cerebellar reflexes not assessed.  Psychiatric: Normal judgment and insight. Alert  and oriented x 3. Normal mood and appropriate affect.   Data Reviewed:  I have independently reviewed and interpreted the patient's clinical laboratory data including her CBC which showed a thrombocytosis.  CMP done and shows a hyponatremia, hypochloremia, elevated glucose level, and AKI with a BUN/creatinine 37/4.05 and elevated anion gap.  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 -We will need to avoid nephrotoxic medications, contrast dyes, hypotension and dehydration to ensure adequate renal perfusion and possible and will need to renally dose medications -Check a urinalysis and urine studies -She has been taking a lot of NSAIDs along with her ARB and nephrology  has been consulted for further evaluation -They feel that this is hemodynamically mediated in the setting of her anasarca and decreased effective arterial blood volume and renal hypoperfusion with NSAID use and ongoing ARB and are going to attempt furosemide diuresis at this time -She could also have autoimmune renal disease and paraproteinemia with amyloidosis associate with RA -No protein noted so does not appear to be a nephrotic syndrome and albumin is normal -Renal ultrasound done and showed "Decreased renal volume bilaterally.  No obstruction or mass.   Right pleural effusion" -Nephrology has ordered complement levels, SPEP and free light chains to evaluate for acute kidney injury and to determine potential need for renal biopsy if indicated by clinical course and serologies -Nephrology recommends avoiding nephrotoxic medication and holding ARB and metformin and recommending preferred pain control with hydromorphone, fentanyl and methadone -We will need strict I's and O's and nephrology had recommended a Foley catheter however due to nursing resistance and floor protocol this was not placed -Repeat and trend renal function carefully  Anasarca with pleural effusions and slight pericardial effusion -Likely in the setting of AKI and NSAID and sodium retention effect with ongoing ARB -Nephrology is on attempt diuresis with furosemide will need to continue monitor -Repeat chest x-ray in a.m.; heart failure has been essentially ruled out given her echocardiogram and a BNP of 25.6 -CT Scan Chest/Abd/Pelvis done and showed "Small-to-moderate bilateral pleural effusions, more so on the right side. Small pericardial effusion. There are small linear patchy densities in right upper lung  field and both lower lung fields suggesting  atelectasis. There is no evidence of intestinal  obstruction or pneumoperitoneum. There is no hydronephrosis. Small ascites. There is diffuse edema in abdominal wall suggesting  anasarca.   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 Scattered calcifications are seen in aorta and major branches."  Adrenal adenoma -As above likely incidental finding -Follow-up in outpatient setting  Hyponatremia -In the setting of AKI and anasarca and they are obtaining serum and urine osmolarity monitoring with diary this is -Continue monitor and trend CMP  Epigastric pain -Could be related to gastritis due to NSAID use versus ulcerations -Placing on PPI and Carafate -Obtaining an FOBT to evaluate for any bloody stools -Given her NSAID use may need GI evaluation if hemoglobin continues to trend downwards  Diabetes mellitus type 2 -Globin A1c was 13.5 -We will repeat hemoglobin A1c here -Continue monitor CBGs per protocol -We will place on sensitive NovoLog/scale insulin AC if necessary  Thrombocytosis -Likely reactive -Continue monitor trend and repeat CBC in a.m.  Lupus -Carries a diagnosis of this and per the patient was recently taken off the medication and question what this was  Possible rheumatoid arthritis -We will need to obtain records from her rheumatologist -She has been on methotrexate 10 mg every Wednesday which we will be held   Advance Care Planning:   Code Status: Full Code   Consults: Nephrology  Family Communication: No family at bedside  Severity of Illness: The appropriate patient status for this patient is INPATIENT. Inpatient status is judged to be reasonable and necessary in order to provide the required intensity of service to ensure the patient's safety. The patient's presenting symptoms, physical exam findings, and initial radiographic and laboratory data in the context of their chronic comorbidities is felt to place them at high risk for further clinical deterioration. Furthermore, it is not anticipated that the patient will be medically stable for discharge from the hospital within 2 midnights of admission.   * I  certify that at the point of admission it is my clinical judgment that the patient will require inpatient hospital care spanning beyond 2 midnights from the point of admission due to high intensity of service, high risk for further deterioration and high frequency of surveillance required.*  Author: Raiford Noble, DO 03/17/2022 8:09 PM  For on call review [email or web address].

## 2022-03-17 NOTE — Consult Note (Signed)
Reason for Consult: Acute kidney injury, anasarca Referring Physician: Osborne Oman MD [facility])  HPI:  61 year old woman with past medical history significant for hypertension, type 2 diabetes mellitus (recent hemoglobin A1c 13.5%), dyslipidemia, rheumatoid arthritis (based on medications) versus lupus (based on prior history/patient recount) and normal renal function at baseline (baseline creatinine 0.7-0.9).  Presented to the emergency room with persistent swelling of the left upper and bilateral lower extremities, abdominal distention and epigastric abdominal pain.  In the emergency room found to have evidence of anasarca and acute kidney injury with creatinine having abruptly risen to 4.0.  Renal ultrasound did not show any obstruction but shows relative decrease in renal mass without increased echogenicity.  She has had some recent nausea/decreased oral intake associated with epigastric pain and had been using ibuprofen about 2-3 times a day depending on severity of pain.  She reports previously having had some diarrhea and abdominal discomfort associated with metformin that led her to stop this medication until recently restarted it on her elevated hemoglobin A1c.  He has been seen by cardiology who did a 2D echocardiogram that showed preserved ejection fraction with mild pericardial effusion for which she was started on furosemide (did not use this as directed because of lack of perceived benefit).  She reports that recently after seen by her primary care provider, was transitioned from [place] to Benicar to limit edema is a side effect of amlodipine.  She follows with Dr. Amil Amen for rheumatology care and reports that he recently discontinued "another medication that she was taking for her lupus since starting with M" (declines this being mycophenolate/Myfortic or meloxicam).  She denies any dysuria, urgency, frequency and reports decreased urine output over the past few days.  She denies any hematuria,  flank pain, fever or chills.  She reports some recent skin irritation and discomfort over the lower extremities associated with edema but denies any peculiar skin rash.  She denies any unusual joint swelling.  She denies any cough, sputum production, wheezing, hemoptysis or epistaxis.  Previous labs are significant for positive antinuclear antibody with titer of >1: 1280 as well as positive RA factor.  Hepatitis C testing negative.  Past Medical History:  Diagnosis Date   Diabetes mellitus without complication ([place])    Hyperlipidemia    Hypertension    Lupus ([place])    Thyroid nodule 03/2010   aspiration    Past Surgical History:  Procedure Laterality Date   ABDOMINAL HYSTERECTOMY     CESAREAN SECTION     SHOULDER SURGERY  2011   LEFT    Family History  Problem Relation Age of Onset   Asthma Mother        doe not know mother well, sister advised her   Diabetes Mother    Heart disease Father        heart attack   Hypertension Father     Social History:  reports that she has quit smoking. Her smoking use included cigarettes. She has a 41.00 pack-year smoking history. She has never used smokeless tobacco. She reports that she does not currently use alcohol after a past usage of about 3.0 standard drinks of alcohol per week. She reports that she does not use drugs.  She was born in [place] and lives in [place].  She is widowed after the death of her husband last year from "bleeding out while on Eliquis".  She has 2 sons who live here locally in [place].  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  She quit smoking 7 months ago and denies any alcohol use.  She denies any illicit drug use.  Allergies:  Allergies  Allergen Reactions   Codeine Nausea And Vomiting   Hydroxychloroquine Rash    Medications: I have reviewed the patient's current medications. Scheduled:  furosemide  80 mg Intravenous BID   pantoprazole  40 mg Oral Daily       Latest Ref Rng & Units 03/17/2022   10:00 AM 02/04/2022    8:22 AM 12/29/2021    9:24 AM  BMP  Glucose 70 - 99 mg/dL 126  131  227   BUN 8 - 23 mg/dL 37  13  10   Creatinine 0.44 - 1.00 mg/dL 4.05  0.92  0.68   BUN/Creat Ratio 12 - 28  14    Sodium 135 - 145 mmol/L 132  135  134   Potassium 3.5 - 5.1 mmol/L 4.3  4.8  4.0   Chloride 98 - 111 mmol/L 93  98  98   CO2 22 - 32 mmol/L '22  25  27   '$ Calcium 8.9 - 10.3 mg/dL 9.4  9.6  10.2       Latest Ref Rng & Units 03/17/2022   10:00 AM 12/29/2021    9:24 AM 12/14/2021   10:01 AM  CBC  WBC 4.0 - 10.5 K/uL 7.7  6.6    Hemoglobin 12.0 - 15.0 g/dL 12.9  15.1  16.3   Hematocrit 36.0 - 46.0 % 37.0  45.0  48.0   Platelets 150 - 400 K/uL 437  528.0       US Renal  Result Date: 03/17/2022 CLINICAL DATA:  Acute kidney injury EXAM: RENAL / URINARY TRACT ULTRASOUND COMPLETE COMPARISON:  CT abdomen pelvis 03/17/2022 FINDINGS: Right Kidney: Renal measurements: 10.6 x 3.8 x 4.8 cm = volume: 100 mL. Echogenicity within normal limits. No mass or hydronephrosis visualized. Left Kidney: Renal measurements: 9.6 x 4.6 x 4.3 cm = volume: 98 mL. Echogenicity within normal limits. No mass or hydronephrosis visualized. Bladder: Appears normal for degree of bladder distention. Other: Right pleural effusion noted. IMPRESSION: Decreased renal volume bilaterally.  No obstruction or mass. Right pleural effusion Electronically Signed   By: Franchot Gallo M.D.   On: 03/17/2022 12:21   CT CHEST ABDOMEN PELVIS WO CONTRAST  Result Date: 03/17/2022 CLINICAL DATA:  Abdominal pain, dizziness, vomiting EXAM: CT CHEST, ABDOMEN AND PELVIS WITHOUT CONTRAST TECHNIQUE: Multidetector CT imaging of the chest, abdomen and pelvis was performed following the standard protocol without IV contrast. RADIATION DOSE REDUCTION: This exam was performed according to the departmental dose-optimization program which includes automated exposure control, adjustment of the mA and/or kV according to  patient size and/or use of iterative reconstruction technique. COMPARISON:  CT abdomen and pelvis done on 02/24/2022 FINDINGS: CT CHEST FINDINGS Cardiovascular: There are scattered calcifications seen thoracic aorta and its major branches. Small pericardial effusion is seen. Mediastinum/Nodes: No significant lymphadenopathy is seen. Lungs/Pleura: Small linear densities are seen in the lateral aspect of right upper lung field and posterior aspects of both lower lung fields. Small to moderate bilateral pleural effusions are seen, more so on the right side. There is no pneumothorax. Musculoskeletal: No acute findings are seen. CT ABDOMEN PELVIS FINDINGS Hepatobiliary: No focal abnormalities are seen in liver. There is no dilation of bile ducts. Gallbladder is unremarkable. Pancreas: No focal abnormalities are seen. Spleen: Spleen appears smaller than usual in size. Adrenals/Urinary Tract: There is 1.4 cm low-density nodule  in right adrenal with no significant change suggesting possible adenoma. No follow-up is recommended. There is no hydronephrosis. There are no renal or ureteral stones. Urinary bladder is not distended. Stomach/Bowel: Stomach is unremarkable. Small bowel loops are not dilated. Appendix is difficult to visualize. In image 86 of series 2, there is a small caliber tubular structure in right lower quadrant, possibly normal appendix. There is no focal pericecal inflammation. There is no significant wall thickening in colon. There is no pericolic stranding. Vascular/Lymphatic: Scattered arterial calcifications are seen. Reproductive: Uterus is not seen. Other: Small ascites is seen in pelvis. There is no pneumoperitoneum. Umbilical hernia with possible small amount of fluid is noted. There is diffuse edema in subcutaneous plane suggesting anasarca. Musculoskeletal: No acute findings are seen. IMPRESSION: Small-to-moderate bilateral pleural effusions, more so on the right side. Small pericardial effusion.  There are small linear patchy densities in right upper lung field and both lower lung fields suggesting atelectasis. There is no evidence of intestinal obstruction or pneumoperitoneum. There is no hydronephrosis. Small ascites. There is diffuse edema in abdominal wall suggesting anasarca. 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 Scattered calcifications are seen in aorta and major branches. Electronically Signed   By: Elmer Picker M.D.   On: 03/17/2022 11:43    Review of Systems  Constitutional:  Positive for appetite change and fatigue. Negative for fever.  HENT:  Negative for congestion, nosebleeds, sore throat and trouble swallowing.   Eyes:  Positive for visual disturbance. Negative for photophobia and redness.  Respiratory:  Negative for cough, chest tightness and shortness of breath.   Cardiovascular:  Positive for leg swelling. Negative for chest pain.  Gastrointestinal:  Positive for abdominal distention, abdominal pain and nausea. Negative for blood in stool, diarrhea and vomiting.  Endocrine: Negative for polydipsia, polyphagia and polyuria.  Genitourinary:  Positive for decreased urine volume. Negative for frequency, hematuria and urgency.  Musculoskeletal:  Positive for arthralgias and myalgias. Negative for joint swelling.  Skin:  Positive for color change. Negative for rash and wound.       Discoloration of skin over legs  Neurological:  Positive for weakness and headaches. Negative for dizziness.   Blood pressure (!) 168/92, pulse (!) 101, temperature 97.7 F (36.5 C), temperature source Oral, resp. rate 15, height '5\' 2"'$  (1.575 m), weight 63.3 kg, SpO2 100 %. Physical Exam Vitals reviewed.  Constitutional:      Appearance: She is normal weight. She is ill-appearing.  HENT:     Head: Normocephalic and atraumatic.     Mouth/Throat:     Mouth: Mucous membranes are moist.     Pharynx: Oropharynx is clear.  Eyes:     Pupils: Pupils are equal,  round, and reactive to light.  Cardiovascular:     Rate and Rhythm: Normal rate and regular rhythm.     Heart sounds: Normal heart sounds. No murmur heard. Pulmonary:     Effort: Pulmonary effort is normal.     Breath sounds: Normal breath sounds. No wheezing.  Abdominal:     General: Bowel sounds are normal. There is distension.     Palpations: Abdomen is soft.     Tenderness: There is abdominal tenderness in the epigastric area.  Musculoskeletal:     Right lower leg: Edema present.     Left lower leg: Edema present.     Comments: 4+ bilateral lower extremity edema  Skin:    General: Skin is warm and dry.  Coloration: Skin is not pale.  Neurological:     Mental Status: She is alert and oriented to person, place, and time.    Assessment/Plan: 1.  Acute kidney injury: Suspect that this is hemodynamically mediated in the setting of anasarca and decreased effective arterial blood volume/renal hypoperfusion/NSAID use with ongoing ARB.  Will attempt diuresis with intravenous furosemide at this time.  Other potential differential diagnosis include autoimmune renal disease (urinalysis pending) and paraproteinemia (amyloidosis associated with RA).  She does not have clear indication of nephrotic syndrome but has a clearly downtrending albumin since her last lab 2 months ago.  I have ordered for complement levels, serum protein electrophoresis and free light chains to further evaluate the etiology of acute kidney injury and to determine potential need for renal biopsy if indicated by clinical course/serologies.  Agree with holding ARB and metformin at this time. Avoid nephrotoxic medications including NSAIDs and iodinated intravenous contrast exposure unless the latter is absolutely indicated.  Preferred narcotic agents for pain control are hydromorphone, fentanyl, and methadone. Morphine should not be used. Avoid Baclofen and avoid oral sodium phosphate and magnesium citrate based laxatives / bowel  preps. Continue strict Input and Output monitoring. Will monitor the patient closely with you and intervene or adjust therapy as indicated by changes in clinical status/labs. 2.  Anasarca: Likely associated with acute kidney injury and sodium retention effect of NSAIDs with ongoing ARB use.  Will attempt diuresis with furosemide. 3.  Hyponatremia: Likely secondary to impaired free water handling in the setting of acute kidney injury/anasarca.  Will obtain serum and urine osmolality and monitor with ongoing diuresis.  Ruling out paraproteinemia. 4.  Epigastric pain: Based on the timeline of events/presenting symptoms, suspect gastritis associated with NSAID use.  Medical management per primary service with H2 RA +/- Carafate (the latter for maximum 72 hours use in setting of acute kidney injury to limit aluminum toxicity). 5.  Hypertension: Hold ARB at this time and monitor with ongoing diuresis, may use beta-blocker such as metoprolol for short-term blood pressure control.  Maylyn Narvaiz K. 03/17/2022, 5:53 PM

## 2022-03-17 NOTE — ED Notes (Signed)
Pt and son aware of pending transfer to [facility]. Consent given

## 2022-03-17 NOTE — ED Provider Notes (Signed)
[facility] DEPART[facility] Provider Note   CSN: [id number] Arrival date & time: 03/17/22  0102     History  Chief Complaint  Patient presents with   Abdominal Pain   Dizziness    Alyssa Rhodes is a 61 y.o. female.   Abdominal Pain Dizziness   61 year old female presents emergency department with complaints of epigastric abdominal pain, dizziness, bilateral lower extremity/right upper extremity/abdominal swelling.  Patient states symptoms been present for approximately the past 3 months.  She has no acute increase in symptoms but the persistence of symptoms brought her into the emergency department today.  She describes the pain in her abdomen as sharp in nature and notes no associated exacerbating or relieving symptoms.  She notes no positional or exertional associations with dizziness states that it comes at random times.  She has been seeing cardiology, rheumatology as well as her primary care about said issues.  An echocardiogram was performed on 03/15/2022 which showed normal heart function with minimal pericardial effusion eliciting no complications.  She has been taking Advil 3-4 times a day for the past 3 months secondary to pain.  She is also been using Lasix intermittently since 02/16/2022.  She denies any known liver complications.  Denies fever, chills, night sweats, chest pain, shortness of breath, nausea, vomiting, urinary symptoms, change in bowel habits.  Past medical history significant for diabetes mellitus, hyperlipidemia, hypertension, lupus, thyroid nodule, tobacco use  Home Medications Prior to Admission medications   Medication Sig Start Date End Date Taking? Authorizing Provider  Ascorbic Acid (VITAMIN C PO) Take by mouth.    [provider]  atorvastatin (LIPITOR) 40 MG tablet Take 1 tablet (40 mg total) by mouth daily. 12/30/21   Maximiano Coss, NP  buPROPion (WELLBUTRIN SR) 150 MG 12 hr tablet Take 1 tablet (150 mg total) by mouth 2 (two)  times daily. 06/30/21   Maximiano Coss, NP  chlorthalidone (HYGROTON) 25 MG tablet Take 1 tablet (25 mg total) by mouth daily. 01/06/22   Janina Mayo, MD  estradiol (ESTRACE VAGINAL) 0.1 MG/GM vaginal cream Place 1 g vaginally 3 (three) times a week. 01/12/22   Chrzanowski, Jami B, NP  folic acid (FOLVITE) 1 MG tablet TAKE 1 TABLET BY MOUTH ONCE DAILY FOR 30 DAYS 02/27/19   [provider]  furosemide (LASIX) 40 MG tablet Take 1 tablet (40 mg total) by mouth daily. 02/16/22   Janina Mayo, MD  metFORMIN (GLUCOPHAGE) 500 MG tablet Take 1 tablet (500 mg total) by mouth 2 (two) times daily with a meal. 01/13/22   Maximiano Coss, NP  methotrexate 2.5 MG tablet 6 tabs a week reported 06/30/21 02/27/19   [provider]  Multiple Vitamin (MULTIVITAMIN) tablet Take 1 tablet by mouth daily.    [provider]  olmesartan (BENICAR) 40 MG tablet Take 1 tablet (40 mg total) by mouth daily. 02/16/22 05/17/22  Janina Mayo, MD  Omega-3 Fatty Acids (FISH OIL PO) Take by mouth daily.    [provider]      Allergies    Codeine and Hydroxychloroquine    Review of Systems   Review of Systems  Gastrointestinal:  Positive for abdominal pain.  Neurological:  Positive for dizziness.    Physical Exam Updated Vital Signs BP (!) 168/92 (BP Location: Left Arm)   Pulse (!) 101   Temp 97.7 F (36.5 C) (Oral)   Resp 15   Ht '5\' 2"'$  (1.575 m)   Wt 63.3 kg  SpO2 100%   BMI 25.51 kg/m  Physical Exam Vitals and nursing note reviewed.  Constitutional:      General: She is not in acute distress.    Appearance: She is well-developed. She is ill-appearing.  HENT:     Head: Normocephalic and atraumatic.  Eyes:     Conjunctiva/sclera: Conjunctivae normal.  Cardiovascular:     Rate and Rhythm: Normal rate and regular rhythm.  Pulmonary:     Effort: Pulmonary effort is normal. No respiratory distress.     Breath sounds: Normal breath sounds. No wheezing or rales.  Abdominal:      Palpations: Abdomen is soft.     Tenderness: There is abdominal tenderness in the epigastric area. There is no right CVA tenderness or left CVA tenderness.  Musculoskeletal:        General: No swelling.     Cervical back: Neck supple.     Right lower leg: Edema present.     Left lower leg: Edema present.     Comments: Patient has edema bilateral lower extremities as well as left upper extremity 2+.  Swelling noted on abdomen indicative of anasarca.  Skin:    General: Skin is warm and dry.     Capillary Refill: Capillary refill takes less than 2 seconds.  Neurological:     Mental Status: She is alert.  Psychiatric:        Mood and Affect: Mood normal.     ED Results / Procedures / Treatments   Labs (all labs ordered are listed, but only abnormal results are displayed) Labs Reviewed  COMPREHENSIVE METABOLIC PANEL - Abnormal; Notable for the following components:      Result Value   Sodium 132 (*)    Chloride 93 (*)    Glucose, Bld 126 (*)    BUN 37 (*)    Creatinine, Ser 4.05 (*)    GFR, Estimated 12 (*)    Anion gap 17 (*)    All other components within normal limits  CBC WITH DIFFERENTIAL/PLATELET - Abnormal; Notable for the following components:   Platelets 437 (*)    All other components within normal limits  URINALYSIS, ROUTINE W REFLEX MICROSCOPIC - Abnormal; Notable for the following components:   Ketones, ur 5 (*)    Leukocytes,Ua TRACE (*)    Bacteria, UA RARE (*)    All other components within normal limits  PROTEIN / CREATININE RATIO, URINE - Abnormal; Notable for the following components:   Protein Creatinine Ratio 0.22 (*)    All other components within normal limits  GLUCOSE, CAPILLARY - Abnormal; Notable for the following components:   Glucose-Capillary <10 (*)    All other components within normal limits  LIPASE, BLOOD  BRAIN NATRIURETIC PEPTIDE  GLUCOSE, CAPILLARY  PROTEIN ELECTROPHORESIS, SERUM  KAPPA/LAMBDA LIGHT CHAINS  C3 COMPLEMENT  C4  COMPLEMENT  RENAL FUNCTION PANEL  MAGNESIUM  OSMOLALITY, URINE  OSMOLALITY  HIV ANTIBODY (ROUTINE TESTING W REFLEX)  CBC  CREATININE, SERUM  TSH  HEMOGLOBIN A1C  COMPREHENSIVE METABOLIC PANEL  CBC  TROPONIN I (HIGH SENSITIVITY)    EKG EKG Interpretation  Date/Time:  Thursday March 17 2022 09:43:17 EDT Ventricular Rate:  92 PR Interval:  150 QRS Duration: 85 QT Interval:  363 QTC Calculation: 449 R Axis:   90 Text Interpretation: Sinus rhythm Borderline right axis deviation Borderline T wave abnormalities Confirmed by Georgina Snell [phone]) on 03/17/2022 10:04:56 AM  Radiology US Renal  Result Date: 03/17/2022 CLINICAL DATA:  Acute kidney injury  EXAM: RENAL / URINARY TRACT ULTRASOUND COMPLETE COMPARISON:  CT abdomen pelvis 03/17/2022 FINDINGS: Right Kidney: Renal measurements: 10.6 x 3.8 x 4.8 cm = volume: 100 mL. Echogenicity within normal limits. No mass or hydronephrosis visualized. Left Kidney: Renal measurements: 9.6 x 4.6 x 4.3 cm = volume: 98 mL. Echogenicity within normal limits. No mass or hydronephrosis visualized. Bladder: Appears normal for degree of bladder distention. Other: Right pleural effusion noted. IMPRESSION: Decreased renal volume bilaterally.  No obstruction or mass. Right pleural effusion Electronically Signed   By: Franchot Gallo M.D.   On: 03/17/2022 12:21   CT CHEST ABDOMEN PELVIS WO CONTRAST  Result Date: 03/17/2022 CLINICAL DATA:  Abdominal pain, dizziness, vomiting EXAM: CT CHEST, ABDOMEN AND PELVIS WITHOUT CONTRAST TECHNIQUE: Multidetector CT imaging of the chest, abdomen and pelvis was performed following the standard protocol without IV contrast. RADIATION DOSE REDUCTION: This exam was performed according to the departmental dose-optimization program which includes automated exposure control, adjustment of the mA and/or kV according to patient size and/or use of iterative reconstruction technique. COMPARISON:  CT abdomen and pelvis done on  02/24/2022 FINDINGS: CT CHEST FINDINGS Cardiovascular: There are scattered calcifications seen thoracic aorta and its major branches. Small pericardial effusion is seen. Mediastinum/Nodes: No significant lymphadenopathy is seen. Lungs/Pleura: Small linear densities are seen in the lateral aspect of right upper lung field and posterior aspects of both lower lung fields. Small to moderate bilateral pleural effusions are seen, more so on the right side. There is no pneumothorax. Musculoskeletal: No acute findings are seen. CT ABDOMEN PELVIS FINDINGS Hepatobiliary: No focal abnormalities are seen in liver. There is no dilation of bile ducts. Gallbladder is unremarkable. Pancreas: No focal abnormalities are seen. Spleen: Spleen appears smaller than usual in size. Adrenals/Urinary Tract: There is 1.4 cm low-density nodule in right adrenal with no significant change suggesting possible adenoma. No follow-up is recommended. There is no hydronephrosis. There are no renal or ureteral stones. Urinary bladder is not distended. Stomach/Bowel: Stomach is unremarkable. Small bowel loops are not dilated. Appendix is difficult to visualize. In image 86 of series 2, there is a small caliber tubular structure in right lower quadrant, possibly normal appendix. There is no focal pericecal inflammation. There is no significant wall thickening in colon. There is no pericolic stranding. Vascular/Lymphatic: Scattered arterial calcifications are seen. Reproductive: Uterus is not seen. Other: Small ascites is seen in pelvis. There is no pneumoperitoneum. Umbilical hernia with possible small amount of fluid is noted. There is diffuse edema in subcutaneous plane suggesting anasarca. Musculoskeletal: No acute findings are seen. IMPRESSION: Small-to-moderate bilateral pleural effusions, more so on the right side. Small pericardial effusion. There are small linear patchy densities in right upper lung field and both lower lung fields suggesting  atelectasis. There is no evidence of intestinal obstruction or pneumoperitoneum. There is no hydronephrosis. Small ascites. There is diffuse edema in abdominal wall suggesting anasarca. 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 Scattered calcifications are seen in aorta and major branches. Electronically Signed   By: Elmer Picker M.D.   On: 03/17/2022 11:43    Procedures Procedures    Medications Ordered in ED Medications  pantoprazole (PROTONIX) EC tablet 40 mg (has no administration in time range)  furosemide (LASIX) injection 80 mg (has no administration in time range)  atorvastatin (LIPITOR) tablet 40 mg (has no administration in time range)  buPROPion (WELLBUTRIN SR) 12 hr tablet 150 mg (has no administration in time range)  folic acid (FOLVITE) tablet  1 mg (has no administration in time range)  multivitamin tablet 1 tablet (has no administration in time range)  heparin injection 5,000 Units (has no administration in time range)  acetaminophen (TYLENOL) tablet 650 mg (has no administration in time range)    Or  acetaminophen (TYLENOL) suppository 650 mg (has no administration in time range)  oxyCODONE (Oxy IR/ROXICODONE) immediate release tablet 5 mg (has no administration in time range)  fentaNYL (SUBLIMAZE) injection 25 mcg (has no administration in time range)  ondansetron (ZOFRAN) tablet 4 mg (has no administration in time range)    Or  ondansetron (ZOFRAN) injection 4 mg (has no administration in time range)  hydrALAZINE (APRESOLINE) injection 10 mg (has no administration in time range)  HYDROcodone-acetaminophen (NORCO/VICODIN) 5-325 MG per tablet 1 tablet (1 tablet Oral Given 03/17/22 1028)  fentaNYL (SUBLIMAZE) injection 50 mcg (50 mcg Intravenous Given 03/17/22 1327)    ED Course/ Medical Decision Making/ A&P Clinical Course as of 03/17/22 1810  Thu Mar 17, 2022  1048 Discussed case with APP managing this patient.  She is anasarcic and has a  new AKI creatinine 4.05 with a BUN to creatinine ratio of 9, less likely from prerenal injury. Normal K 4.3.  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  Recent echocardiogram showed small pericardial effusion but no heart failure normal RV and LV function.  We will plan on consulting nephrology for recommendations regarding fluids versus diuresis although suspect more likely will require fluids, since normal heart function and makes cardiorenal syndrome less likely.  Plan to admit for significant AKI. [VB]  46 Consulted Dr. Pearson Grippe of nephrology.  He agreed with admission of the patient to either [facility] or [facility].  He recommended holding on IV diuresis and fluids at this time until further work-up is conducted.  He recommended renal ultrasound as well as urinalysis. [CR]  [place] Dr. Olevia Bowens of hospital medicine.  He agreed with admission of the patient and assume further treatment/care. [CR]    Clinical Course User Index [CR] Wilnette Kales, PA [VB] Elgie Congo, MD                           Medical Decision Making Amount and/or Complexity of Data Reviewed Labs: ordered. Radiology: ordered.  Risk Prescription drug management. Decision regarding hospitalization.   This patient presents to the ED for concern of abdominal pain, swelling of abdomen/lower extremities/left upper extremity, this involves an extensive number of treatment options, and is a complaint that carries with it a high risk of complications and morbidity.  The differential diagnosis includes lupus induced nephritis, liver failure, hypoalbuminemia, diffuse lymphedema, CHF exacerbation, pancreatitis, gastritis, cholecystitis, ACS   Co morbidities that complicate the patient evaluation  See HPI   Additional history obtained:  Additional history obtained from EMR External records from outside source obtained and reviewed including echocardiogram  from 03/15/2022 indicating left ventricular ejection fraction of 60 to 65% with normal right ventricular systolic function with small pericardial effusion   Lab Tests:  I Ordered, and personally interpreted labs.  The pertinent results include: Mild hyponatremia with a sodium 132 and hypochloremia with a chloride of 93.  New AKI with a creatinine of 4.05, BUN of 37 and GFR of 12.  No transaminitis noted.  No leukocytosis noted.  No evidence of anemia.  Platelets slightly elevated fourth of 37.  Initial troponin of 9 with repeat pending.  BNP of 25.6.  Lipase of 28.  UA pending upon admission.   Imaging Studies ordered:  I ordered imaging studies including CT chest abdomen pelvis without contrast.  Renal ultrasound pending upon admission I independently visualized and interpreted imaging which showed  CT chest abdomen pelvis: Small to moderate bilateral pleural effusions more on the right.  Small pericardial effusion.  Linear densities in the right upper lung field in both lower lung fields indicative of atelectasis.  No evidence of intestinal obstruction or pneumoperitoneum.  No hydronephrosis.  Small ascites with diffuse edema noted abdominal wall.  1.4 cm low-density nodule in the right adrenal most likely an adenoma. I agree with the radiologist interpretation  Cardiac Monitoring: / EKG:  The patient was maintained on a cardiac monitor.  I personally viewed and interpreted the cardiac monitored which showed an underlying rhythm of: Sinus rhythm with nonspecific ST changes.   Consultations Obtained:  See ED course  Problem List / ED Course / Critical interventions / Medication management  Acute kidney injury/anasarca I ordered medication including Norco and fentanyl for pain  Reevaluation of the patient after these medicines showed that the patient improved I have reviewed the patients home medicines and have made adjustments as needed   Social Determinants of Health:  Chronic  tobacco use.  Denies alcohol or illicit drug use.   Test / Admission - Considered:  Acute kidney injury/anasarca Vitals signs significant for hypertension with a blood pressure 160/92 which stayed persistently high while in the emergency department.  Elevated heart rate with reading of 101.  IV fluids versus diuresis. Otherwise within normal range and stable throughout visit. Laboratory/imaging studies significant for: See above Patient's symptoms likely secondary to acute kidney injury with presumed causes being nephrotoxic agents of both ibuprofen/Lasix and/or lupus nephritis.  Patient need to meet admission criteria.  Nephrology and hospital medicine was consulted and manner listed above.  Treatment plan was discussed at length with patient and she knowledge understanding of said plan and was agreeable.  Patient was stable upon admission to the hospital.        Final Clinical Impression(s) / ED Diagnoses Final diagnoses:  Acute kidney injury [facility])  Epigastric pain  Dizziness  Anasarca    Rx / DC Orders ED Discharge Orders     None         Wilnette Kales, [place] 03/17/22 1810    Elgie Congo, MD 03/17/22 1815

## 2022-03-17 NOTE — Progress Notes (Signed)
Plan of Care Note for accepted transfer   Patient: Alyssa Rhodes MRN: [id number]   DOA: 03/17/2022  Facility requesting transfer: [facility].  Requesting Provider: Burt Knack probings PA-C and Georgina Snell, MD Reason for transfer: AKI. Facility course:  Per EDP: " Chief Complaint  Patient presents with   Abdominal Pain   Dizziness      Alyssa Rhodes is a 61 y.o. female.     Abdominal Pain Dizziness    61 year old female presents emergency department with complaints of epigastric abdominal pain, dizziness, bilateral lower extremity/right upper extremity/abdominal swelling.  Patient states symptoms been present for approximately the past 3 months.  She has no acute increase in symptoms but the persistence of symptoms brought her into the emergency department today.  She describes the pain in her abdomen as sharp in nature and notes no associated exacerbating or relieving symptoms.  She notes no positional or exertional associations with dizziness states that it comes at random times.  She has been seeing cardiology, rheumatology as well as her primary care about said issues.  An echocardiogram was performed on 03/15/2022 which showed normal heart function with minimal pericardial effusion eliciting no complications.  She has been taking Advil 3-4 times a day for the past 3 months secondary to pain.  She is also been using Lasix intermittently since 02/16/2022.  She denies any known liver complications.  Denies fever, chills, night sweats, chest pain, shortness of breath, nausea, vomiting, urinary symptoms, change in bowel habits."  Lab work:  Component Value Units  Troponin I (High Sensitivity) [[id number]]   Collected: 03/17/22 1000   Updated: 03/17/22 1042   Specimen Source: Vein    Troponin I (High Sensitivity) 9 ng/L  Brain natriuretic peptide [[id number]]   Collected: 03/17/22 1000   Updated: 03/17/22 1041   Specimen Type: Blood   Specimen Source: Vein    B Natriuretic  Peptide 25.6 pg/mL  Comprehensive metabolic panel [[id number]] (Abnormal)   Collected: 03/17/22 1000   Updated: 03/17/22 1033   Specimen Type: Blood   Specimen Source: Vein    Sodium 132 Low  mmol/L   Potassium 4.3 mmol/L   Chloride 93 Low  mmol/L   CO2 22 mmol/L   Glucose, Bld 126 High  mg/dL   BUN 37 High  mg/dL   Creatinine, Ser 4.05 High  mg/dL   Calcium 9.4 mg/dL   Total Protein 6.9 g/dL   Albumin 3.6 g/dL   AST 24 U/L   ALT 17 U/L   Alkaline Phosphatase 52 U/L   Total Bilirubin 0.8 mg/dL   GFR, Estimated 12 Low  mL/min   Anion gap 17 High   Lipase, blood [[id number]]   Collected: 03/17/22 1000   Updated: 03/17/22 1033   Specimen Type: Blood   Specimen Source: Vein    Lipase 28 U/L  CBC with Differential [[id number]] (Abnormal)   Collected: 03/17/22 1000   Updated: 03/17/22 1009   Specimen Type: Blood   Specimen Source: Vein    WBC 7.7 K/uL   RBC 4.05 MIL/uL   Hemoglobin 12.9 g/dL   HCT 37.0 %   MCV 91.4 fL   MCH 31.9 pg   MCHC 34.9 g/dL   RDW 14.6 %   Platelets 437 High  K/uL   nRBC 0.0 %   Neutrophils Relative % 84 %   Neutro Abs 6.5 K/uL   Lymphocytes Relative 10 %   Lymphs Abs 0.8 K/uL   Monocytes Relative 5 %   Monocytes  Absolute 0.4 K/uL   Eosinophils Relative 1 %   Eosinophils Absolute 0.1 K/uL   Basophils Relative 0 %   Basophils Absolute 0.0 K/uL   Immature Granulocytes 0 %   Abs Immature Granulocytes 0.03 K/uL    Plan of care: The patient is accepted for admission to Telemetry unit, at [facility] or [facility].  Nephrology on-call requested to hold off on diuretics or IV fluids at the moment.  Please notify them when she arrives to the facility.    Author: Reubin Milan, MD 03/17/2022  Check [email or web address] for on-call coverage.  Nursing staff, Please call [place] number on Amion as soon as patient's arrival, so appropriate admitting provider can evaluate the pt.

## 2022-03-17 NOTE — ED Notes (Signed)
Report given to [facility] 

## 2022-03-17 NOTE — ED Triage Notes (Signed)
C/o abdominal pain, dizziness, nausea/vomiting, left arm & bilateral leg swelling x 3 months. Has seen several doctors for same. States worsened over the past few days.

## 2022-03-17 NOTE — ED Notes (Signed)
Pt unable to give urine sample at this time , will ask again later.

## 2022-03-17 NOTE — ED Notes (Signed)
ED TO INPATIENT HANDOFF REPORT  ED Nurse Name and Phone #:  Angelina Pih RN  S Name/Age/Gender Alyssa Rhodes 61 y.o. female Room/Bed: MH08/MH08  Code Status   Code Status: Not on file  Home/SNF/Other Home Patient oriented to: self, place, time, and situation Is this baseline? Yes   Triage Complete: Triage complete  Chief Complaint AKI (acute kidney injury) ([place]) [N17.9]  Triage Note C/o abdominal pain, dizziness, nausea/vomiting, left arm & bilateral leg swelling x 3 months. Has seen several doctors for same. States worsened over the past few days.   Allergies Allergies  Allergen Reactions   Codeine Nausea And Vomiting   Hydroxychloroquine Rash    Level of Care/Admitting Diagnosis ED Disposition     ED Disposition  Admit   Condition  --   Comment  Hospital Area: [place] [[id number]]  Level of Care: Telemetry [5]  Admit to tele based on following criteria: Monitor for Ischemic changes  Interfacility transfer: Yes  May place patient in observation at [facility] or [place] if equivalent level of care is available:: Yes  Covid Evaluation: Asymptomatic - no recent exposure (last 10 days) testing not required  Diagnosis: AKI (acute kidney injury) [facility]) [[id number]]  Admitting Physician: Reubin Milan [[id number]]  Attending Physician: Reubin Milan [[id number]]          B Medical/Surgery History Past Medical History:  Diagnosis Date   Diabetes mellitus without complication ([place])    Hyperlipidemia    Hypertension    Lupus ([place])    Thyroid nodule 03/2010   aspiration   Past Surgical History:  Procedure Laterality Date   ABDOMINAL HYSTERECTOMY     CESAREAN SECTION     SHOULDER SURGERY  2011   LEFT     A IV Location/Drains/Wounds Patient Lines/Drains/Airways Status     Active Line/Drains/Airways     Name Placement date Placement time Site Days   Peripheral IV 03/17/22 22 G Right;Distal Forearm 03/17/22  1013  Forearm  less  than 1            Intake/Output Last 24 hours No intake or output data in the 24 hours ending 03/17/22 1539  Labs/Imaging Results for orders placed or performed during the hospital encounter of 03/17/22 (from the past 48 hour(s))  Comprehensive metabolic panel     Status: Abnormal   Collection Time: 03/17/22 10:00 AM  Result Value Ref Range   Sodium 132 (L) 135 - 145 mmol/L   Potassium 4.3 3.5 - 5.1 mmol/L   Chloride 93 (L) 98 - 111 mmol/L   CO2 22 22 - 32 mmol/L   Glucose, Bld 126 (H) 70 - 99 mg/dL    Comment: Glucose reference range applies only to samples taken after fasting for at least 8 hours.   BUN 37 (H) 8 - 23 mg/dL   Creatinine, Ser 4.05 (H) 0.44 - 1.00 mg/dL   Calcium 9.4 8.9 - 10.3 mg/dL   Total Protein 6.9 6.5 - 8.1 g/dL   Albumin 3.6 3.5 - 5.0 g/dL   AST 24 15 - 41 U/L   ALT 17 0 - 44 U/L   Alkaline Phosphatase 52 38 - 126 U/L   Total Bilirubin 0.8 0.3 - 1.2 mg/dL   GFR, Estimated 12 (L) >60 mL/min    Comment: (NOTE) Calculated using the CKD-EPI Creatinine Equation (2021)    Anion gap 17 (H) 5 - 15    Comment: Performed at [facility], [address] [place], [place] [address]  CBC with Differential     Status: Abnormal   Collection Time: 03/17/22 10:00 AM  Result Value Ref Range   WBC 7.7 4.0 - 10.5 K/uL   RBC 4.05 3.87 - 5.11 MIL/uL   Hemoglobin 12.9 12.0 - 15.0 g/dL   HCT 37.0 36.0 - 46.0 %   MCV 91.4 80.0 - 100.0 fL   MCH 31.9 26.0 - 34.0 pg   MCHC 34.9 30.0 - 36.0 g/dL   RDW 14.6 11.5 - 15.5 %   Platelets 437 (H) 150 - 400 K/uL   nRBC 0.0 0.0 - 0.2 %   Neutrophils Relative % 84 %   Neutro Abs 6.5 1.7 - 7.7 K/uL   Lymphocytes Relative 10 %   Lymphs Abs 0.8 0.7 - 4.0 K/uL   Monocytes Relative 5 %   Monocytes Absolute 0.4 0.1 - 1.0 K/uL   Eosinophils Relative 1 %   Eosinophils Absolute 0.1 0.0 - 0.5 K/uL   Basophils Relative 0 %   Basophils Absolute 0.0 0.0 - 0.1 K/uL   Immature Granulocytes 0 %   Abs Immature Granulocytes 0.03  0.00 - 0.07 K/uL    Comment: Performed at [facility], [address] [place], [place] [address]  Lipase, blood     Status: None   Collection Time: 03/17/22 10:00 AM  Result Value Ref Range   Lipase 28 11 - 51 U/L    Comment: Performed at [facility], [address] [place], [place] [address]  Troponin I (High Sensitivity)     Status: None   Collection Time: 03/17/22 10:00 AM  Result Value Ref Range   Troponin I (High Sensitivity) 9 <18 ng/L    Comment: (NOTE) Elevated high sensitivity troponin I (hsTnI) values and significant  changes across serial measurements may suggest ACS but many other  chronic and acute conditions are known to elevate hsTnI results.  Refer to the "Links" section for chest pain algorithms and additional  guidance. Performed at [facility], [address] [place], [place] [address]   Brain natriuretic peptide     Status: None   Collection Time: 03/17/22 10:00 AM  Result Value Ref Range   B Natriuretic Peptide 25.6 0.0 - 100.0 pg/mL    Comment: Performed at [facility], [address] [place], [place] [address]   US Renal  Result Date: 03/17/2022 CLINICAL DATA:  Acute kidney injury EXAM: RENAL / URINARY TRACT ULTRASOUND COMPLETE COMPARISON:  CT abdomen pelvis 03/17/2022 FINDINGS: Right Kidney: Renal measurements: 10.6 x 3.8 x 4.8 cm = volume: 100 mL. Echogenicity within normal limits. No mass or hydronephrosis visualized. Left Kidney: Renal measurements: 9.6 x 4.6 x 4.3 cm = volume: 98 mL. Echogenicity within normal limits. No mass or hydronephrosis visualized. Bladder: Appears normal for degree of bladder distention. Other: Right pleural effusion noted. IMPRESSION: Decreased renal volume bilaterally.  No obstruction or mass. Right pleural effusion Electronically Signed   By: Franchot Gallo M.D.   On: 03/17/2022 12:21   CT CHEST ABDOMEN PELVIS WO CONTRAST  Result Date: 03/17/2022 CLINICAL DATA:  Abdominal pain,  dizziness, vomiting EXAM: CT CHEST, ABDOMEN AND PELVIS WITHOUT CONTRAST TECHNIQUE: Multidetector CT imaging of the chest, abdomen and pelvis was performed following the standard protocol without IV contrast. RADIATION DOSE REDUCTION: This exam was performed according to the departmental dose-optimization program which includes automated exposure control, adjustment of the mA and/or kV according to  patient size and/or use of iterative reconstruction technique. COMPARISON:  CT abdomen and pelvis done on 02/24/2022 FINDINGS: CT CHEST FINDINGS Cardiovascular: There are scattered calcifications seen thoracic aorta and its major branches. Small pericardial effusion is seen. Mediastinum/Nodes: No significant lymphadenopathy is seen. Lungs/Pleura: Small linear densities are seen in the lateral aspect of right upper lung field and posterior aspects of both lower lung fields. Small to moderate bilateral pleural effusions are seen, more so on the right side. There is no pneumothorax. Musculoskeletal: No acute findings are seen. CT ABDOMEN PELVIS FINDINGS Hepatobiliary: No focal abnormalities are seen in liver. There is no dilation of bile ducts. Gallbladder is unremarkable. Pancreas: No focal abnormalities are seen. Spleen: Spleen appears smaller than usual in size. Adrenals/Urinary Tract: There is 1.4 cm low-density nodule in right adrenal with no significant change suggesting possible adenoma. No follow-up is recommended. There is no hydronephrosis. There are no renal or ureteral stones. Urinary bladder is not distended. Stomach/Bowel: Stomach is unremarkable. Small bowel loops are not dilated. Appendix is difficult to visualize. In image 86 of series 2, there is a small caliber tubular structure in right lower quadrant, possibly normal appendix. There is no focal pericecal inflammation. There is no significant wall thickening in colon. There is no pericolic stranding. Vascular/Lymphatic: Scattered arterial calcifications  are seen. Reproductive: Uterus is not seen. Other: Small ascites is seen in pelvis. There is no pneumoperitoneum. Umbilical hernia with possible small amount of fluid is noted. There is diffuse edema in subcutaneous plane suggesting anasarca. Musculoskeletal: No acute findings are seen. IMPRESSION: Small-to-moderate bilateral pleural effusions, more so on the right side. Small pericardial effusion. There are small linear patchy densities in right upper lung field and both lower lung fields suggesting atelectasis. There is no evidence of intestinal obstruction or pneumoperitoneum. There is no hydronephrosis. Small ascites. There is diffuse edema in abdominal wall suggesting anasarca. 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 Scattered calcifications are seen in aorta and major branches. Electronically Signed   By: Elmer Picker M.D.   On: 03/17/2022 11:43    Pending Labs Unresulted Labs (From admission, onward)     Start     Ordered   03/17/22 0942  Urinalysis, Routine w reflex microscopic  Once,   URGENT        03/17/22 0955            Vitals/Pain Today's Vitals   03/17/22 1330 03/17/22 1345 03/17/22 1441 03/17/22 1500  BP: (!) 162/111   (!) 149/91  Pulse: (!) 51     Resp: 15   14  Temp:    97.9 F (36.6 C)  TempSrc:      SpO2: 100%   100%  Weight:      Height:      PainSc:  7  7      Isolation Precautions No active isolations  Medications Medications  HYDROcodone-acetaminophen (NORCO/VICODIN) 5-325 MG per tablet 1 tablet (1 tablet Oral Given 03/17/22 1028)  fentaNYL (SUBLIMAZE) injection 50 mcg (50 mcg Intravenous Given 03/17/22 1327)    Mobility walks Low fall risk   Focused Assessments    R Recommendations: See Admitting Provider Note  Report given to:   Additional Notes: Pt came from home.

## 2022-03-18 ENCOUNTER — Observation Stay (HOSPITAL_COMMUNITY): Payer: BC Managed Care – PPO

## 2022-03-18 DIAGNOSIS — E1122 Type 2 diabetes mellitus with diabetic chronic kidney disease: Secondary | ICD-10-CM | POA: Diagnosis present

## 2022-03-18 DIAGNOSIS — J90 Pleural effusion, not elsewhere classified: Secondary | ICD-10-CM | POA: Diagnosis present

## 2022-03-18 DIAGNOSIS — M7989 Other specified soft tissue disorders: Secondary | ICD-10-CM | POA: Diagnosis not present

## 2022-03-18 DIAGNOSIS — Z79899 Other long term (current) drug therapy: Secondary | ICD-10-CM | POA: Diagnosis not present

## 2022-03-18 DIAGNOSIS — I3139 Other pericardial effusion (noninflammatory): Secondary | ICD-10-CM | POA: Diagnosis present

## 2022-03-18 DIAGNOSIS — R609 Edema, unspecified: Secondary | ICD-10-CM | POA: Diagnosis not present

## 2022-03-18 DIAGNOSIS — Z885 Allergy status to narcotic agent status: Secondary | ICD-10-CM | POA: Diagnosis not present

## 2022-03-18 DIAGNOSIS — N179 Acute kidney failure, unspecified: Secondary | ICD-10-CM | POA: Diagnosis not present

## 2022-03-18 DIAGNOSIS — D75839 Thrombocytosis, unspecified: Secondary | ICD-10-CM | POA: Diagnosis present

## 2022-03-18 DIAGNOSIS — K3189 Other diseases of stomach and duodenum: Secondary | ICD-10-CM | POA: Diagnosis present

## 2022-03-18 DIAGNOSIS — I10 Essential (primary) hypertension: Secondary | ICD-10-CM | POA: Diagnosis not present

## 2022-03-18 DIAGNOSIS — E871 Hypo-osmolality and hyponatremia: Secondary | ICD-10-CM | POA: Diagnosis not present

## 2022-03-18 DIAGNOSIS — Z7984 Long term (current) use of oral hypoglycemic drugs: Secondary | ICD-10-CM | POA: Diagnosis not present

## 2022-03-18 DIAGNOSIS — E785 Hyperlipidemia, unspecified: Secondary | ICD-10-CM | POA: Diagnosis present

## 2022-03-18 DIAGNOSIS — D3500 Benign neoplasm of unspecified adrenal gland: Secondary | ICD-10-CM | POA: Diagnosis present

## 2022-03-18 DIAGNOSIS — Z825 Family history of asthma and other chronic lower respiratory diseases: Secondary | ICD-10-CM | POA: Diagnosis not present

## 2022-03-18 DIAGNOSIS — Z833 Family history of diabetes mellitus: Secondary | ICD-10-CM | POA: Diagnosis not present

## 2022-03-18 DIAGNOSIS — M069 Rheumatoid arthritis, unspecified: Secondary | ICD-10-CM | POA: Diagnosis present

## 2022-03-18 DIAGNOSIS — M79604 Pain in right leg: Secondary | ICD-10-CM | POA: Diagnosis not present

## 2022-03-18 DIAGNOSIS — E876 Hypokalemia: Secondary | ICD-10-CM | POA: Diagnosis present

## 2022-03-18 DIAGNOSIS — Z87891 Personal history of nicotine dependence: Secondary | ICD-10-CM | POA: Diagnosis not present

## 2022-03-18 DIAGNOSIS — Z8249 Family history of ischemic heart disease and other diseases of the circulatory system: Secondary | ICD-10-CM | POA: Diagnosis not present

## 2022-03-18 DIAGNOSIS — M79605 Pain in left leg: Secondary | ICD-10-CM | POA: Diagnosis not present

## 2022-03-18 DIAGNOSIS — M06072 Rheumatoid arthritis without rheumatoid factor, left ankle and foot: Secondary | ICD-10-CM | POA: Diagnosis present

## 2022-03-18 DIAGNOSIS — R42 Dizziness and giddiness: Secondary | ICD-10-CM | POA: Diagnosis present

## 2022-03-18 DIAGNOSIS — D649 Anemia, unspecified: Secondary | ICD-10-CM | POA: Diagnosis present

## 2022-03-18 DIAGNOSIS — R601 Generalized edema: Secondary | ICD-10-CM | POA: Diagnosis not present

## 2022-03-18 DIAGNOSIS — M06071 Rheumatoid arthritis without rheumatoid factor, right ankle and foot: Secondary | ICD-10-CM | POA: Diagnosis present

## 2022-03-18 DIAGNOSIS — Z90710 Acquired absence of both cervix and uterus: Secondary | ICD-10-CM | POA: Diagnosis not present

## 2022-03-18 LAB — COMPREHENSIVE METABOLIC PANEL
ALT: 18 U/L (ref 0–44)
AST: 21 U/L (ref 15–41)
Albumin: 3.4 g/dL — ABNORMAL LOW (ref 3.5–5.0)
Alkaline Phosphatase: 45 U/L (ref 38–126)
Anion gap: 13 (ref 5–15)
BUN: 41 mg/dL — ABNORMAL HIGH (ref 8–23)
CO2: 24 mmol/L (ref 22–32)
Calcium: 9.2 mg/dL (ref 8.9–10.3)
Chloride: 99 mmol/L (ref 98–111)
Creatinine, Ser: 3.93 mg/dL — ABNORMAL HIGH (ref 0.44–1.00)
GFR, Estimated: 12 mL/min — ABNORMAL LOW (ref >60)
Glucose, Bld: 108 mg/dL — ABNORMAL HIGH (ref 70–99)
Potassium: 3.9 mmol/L (ref 3.5–5.1)
Sodium: 136 mmol/L (ref 135–145)
Total Bilirubin: 0.8 mg/dL (ref 0.3–1.2)
Total Protein: 6 g/dL — ABNORMAL LOW (ref 6.5–8.1)

## 2022-03-18 LAB — CBC WITH DIFFERENTIAL/PLATELET
Abs Immature Granulocytes: 0.02 10*3/uL (ref 0.00–0.07)
Basophils Absolute: 0 10*3/uL (ref 0.0–0.1)
Basophils Relative: 0 %
Eosinophils Absolute: 0.1 10*3/uL (ref 0.0–0.5)
Eosinophils Relative: 1 %
HCT: 34.4 % — ABNORMAL LOW (ref 36.0–46.0)
Hemoglobin: 11.7 g/dL — ABNORMAL LOW (ref 12.0–15.0)
Immature Granulocytes: 0 %
Lymphocytes Relative: 17 %
Lymphs Abs: 0.9 10*3/uL (ref 0.7–4.0)
MCH: 32.4 pg (ref 26.0–34.0)
MCHC: 34 g/dL (ref 30.0–36.0)
MCV: 95.3 fL (ref 80.0–100.0)
Monocytes Absolute: 0.4 10*3/uL (ref 0.1–1.0)
Monocytes Relative: 7 %
Neutro Abs: 4 10*3/uL (ref 1.7–7.7)
Neutrophils Relative %: 75 %
Platelets: 363 10*3/uL (ref 150–400)
RBC: 3.61 MIL/uL — ABNORMAL LOW (ref 3.87–5.11)
RDW: 14.7 % (ref 11.5–15.5)
WBC: 5.5 10*3/uL (ref 4.0–10.5)
nRBC: 0 % (ref 0.0–0.2)

## 2022-03-18 LAB — HEMOGLOBIN A1C
Hgb A1c MFr Bld: 7.5 % — ABNORMAL HIGH (ref 4.8–5.6)
Mean Plasma Glucose: 168.55 mg/dL

## 2022-03-18 LAB — PHOSPHORUS: Phosphorus: 5.8 mg/dL — ABNORMAL HIGH (ref 2.5–4.6)

## 2022-03-18 LAB — MAGNESIUM: Magnesium: 2 mg/dL (ref 1.7–2.4)

## 2022-03-18 LAB — OSMOLALITY, URINE: Osmolality, Ur: 226 mOsm/kg — ABNORMAL LOW (ref 300–900)

## 2022-03-18 LAB — OSMOLALITY: Osmolality: 291 mOsm/kg (ref 275–295)

## 2022-03-18 LAB — TSH: TSH: 1.199 u[IU]/mL (ref 0.350–4.500)

## 2022-03-18 LAB — HIV ANTIBODY (ROUTINE TESTING W REFLEX): HIV Screen 4th Generation wRfx: NONREACTIVE

## 2022-03-18 LAB — GLUCOSE, CAPILLARY: Glucose-Capillary: 107 mg/dL — ABNORMAL HIGH (ref 70–99)

## 2022-03-18 NOTE — TOC Initial Note (Signed)
Transition of Care [facility]) - Initial/Assessment Note    Patient Details  Name: Alyssa Rhodes MRN: [id number] Date of Birth: August 29, 1960  Transition of Care [facility]) CM/SW Contact:    Vassie Moselle, LCSW Phone Number: 03/18/2022, 1:23 PM  Clinical Narrative:                 Met with pt to discuss recommendations for HHPT/OT. Pt shares that she plans to return to work once discharged from the hospital. She states she works 5 days a week until [employer] each day. Pt also recommended for rolling walker and BSC. Pt is agreeable to having this DME delivered. CSW reached out to OT/PT regarding home health recommendation as pt works and would not be home for home health. Per OT pt may progress while in the hospital and no longer need home health or BSC. TOC will continue to follow pt's progression and recommendations prior to discharge.   Expected Discharge Plan: [place] Barriers to Discharge: Continued Medical Work up   Patient Goals and CMS Choice Patient states their goals for this hospitalization and ongoing recovery are:: To go home   Choice offered to / list presented to : Patient  Expected Discharge Plan and Services Expected Discharge Plan: [place] In-house Referral: NA Discharge Planning Services: NA Post Acute Care Choice: Durable Medical Equipment, Home Health Living arrangements for the past 2 months: Single Family Home                                      Prior Living Arrangements/Services Living arrangements for the past 2 months: Single Family Home Lives with:: Adult Children Patient language and need for interpreter reviewed:: Yes Do you feel safe going back to the place where you live?: Yes      Need for Family Participation in Patient Care: No (Comment) Care giver support system in place?: No (comment)   Criminal Activity/Legal Involvement Pertinent to Current Situation/Hospitalization: No - Comment as needed  Activities of  Daily Living Home Assistive Devices/Equipment: None ADL Screening (condition at time of admission) Patient's cognitive ability adequate to safely complete daily activities?: Yes Is the patient deaf or have difficulty hearing?: No Does the patient have difficulty seeing, even when wearing glasses/contacts?: No Does the patient have difficulty concentrating, remembering, or making decisions?: No Patient able to express need for assistance with ADLs?: Yes Does the patient have difficulty dressing or bathing?: Yes Independently performs ADLs?: Yes (appropriate for developmental age) Does the patient have difficulty walking or climbing stairs?: Yes Weakness of Legs: Both Weakness of Arms/Hands: Left  Permission Sought/Granted   Permission granted to share information with : No              Emotional Assessment Appearance:: Appears older than stated age Attitude/Demeanor/Rapport: Engaged Affect (typically observed): Accepting Orientation: : Oriented to Self, Oriented to Place, Oriented to  Time, Oriented to Situation Alcohol / Substance Use: Not Applicable Psych Involvement: No (comment)  Admission diagnosis:  Dizziness [R42] Anasarca [R60.1] Epigastric pain [R10.13] AKI (acute kidney injury) ([place]) [N17.9] Acute kidney injury ([place]) [N17.9] Patient Active Problem List   Diagnosis Date Noted   AKI (acute kidney injury) ([place]) 03/17/2022   Iron deficiency anemia 12/18/2019   Microcytic anemia 12/17/2019   Lupus ([facility])    Other fatigue    Deficiency anemia    HTN (hypertension) 10/18/2011  Tobacco user 10/18/2011   PCP:  Doretha Sou, MD Pharmacy:   [facility], [address] [place] [address] Phone: [phone] Fax: [phone]     Social Determinants of Health (SDOH) Interventions    Readmission Risk Interventions     No data to display

## 2022-03-18 NOTE — Evaluation (Signed)
Occupational Therapy Evaluation Patient Details Name: Alyssa Rhodes MRN: [id number] DOB: July 12, 1961 Today's Date: 03/18/2022   History of Present Illness Pt is a 61 yo female admitted with swelling of BLEs and LUE and abdomen for last month.  Pt found to have AKI and pleural and pericardial effusions. PMH: HTN, DM, RA, lupus.   Clinical Impression   Pt admitted with the above diagnosis and has the deficits outlined below. Pt would benefit from cont OT to increase independence with basic adls and adl transfers so pt can d/c back home living with her son. Pt works full time but is usually sitting.  If pain can be under better control, feel pt could go home but may not be able to tolerate back to work until standing tolerance increases. Will continue to see with focus on ADLS in standing.       Recommendations for follow up therapy are one component of a multi-disciplinary discharge planning process, led by the attending physician.  Recommendations may be updated based on patient status, additional functional criteria and insurance authorization.   Follow Up Recommendations  Home health OT    Assistance Recommended at Discharge Intermittent Supervision/Assistance  Patient can return home with the following A little help with walking and/or transfers;A little help with bathing/dressing/bathroom;Assistance with cooking/housework;Assist for transportation    Functional Status Assessment  Patient has had a recent decline in their functional status and demonstrates the ability to make significant improvements in function in a reasonable and predictable amount of time.  Equipment Recommendations  BSC/3in1    Recommendations for Other Services       Precautions / Restrictions Precautions Precautions: Fall Restrictions Weight Bearing Restrictions: No      Mobility Bed Mobility               General bed mobility comments: Pt in chair on arrival    Transfers Overall transfer  level: Needs assistance Equipment used: Rolling walker (2 wheels) Transfers: Sit to/from Stand, Bed to chair/wheelchair/BSC Sit to Stand: Min assist     Step pivot transfers: Min assist     General transfer comment: Cues for hand placement      Balance Overall balance assessment: Needs assistance Sitting-balance support: Feet supported Sitting balance-Leahy Scale: Good     Standing balance support: Bilateral upper extremity supported, During functional activity, Reliant on assistive device for balance Standing balance-Leahy Scale: Poor Standing balance comment: Pt relied heavily on walker and sink when grooming in standing.                           ADL either performed or assessed with clinical judgement   ADL Overall ADL's : Needs assistance/impaired Eating/Feeding: Set up;Sitting   Grooming: Wash/dry hands;Wash/dry face;Oral care;Min guard;Standing Grooming Details (indicate cue type and reason): Pt stood at sink but could not tolerate more than 2 minutes of standing due to pain Upper Body Bathing: Set up;Sitting   Lower Body Bathing: Minimal assistance;Sit to/from stand;Cueing for compensatory techniques Lower Body Bathing Details (indicate cue type and reason): min assist to stand and wash bottom with walker due to pain Upper Body Dressing : Set up;Sitting   Lower Body Dressing: Minimal assistance;Sit to/from stand;Cueing for compensatory techniques   Toilet Transfer: Minimal assistance;Ambulation;Rolling walker (2 wheels);Comfort height toilet;Grab bars Toilet Transfer Details (indicate cue type and reason): walked to bsc Toileting- Clothing Manipulation and Hygiene: Sit to/from stand;Minimal assistance;Cueing for compensatory techniques       Functional  mobility during ADLs: Minimal assistance;Rolling walker (2 wheels) General ADL Comments: Pt in a great amount of pain when up on feet. Pt used walker requiring cues when standing to utilize walker.      Vision Baseline Vision/History: 1 Wears glasses Ability to See in Adequate Light: 0 Adequate Patient Visual Report: No change from baseline Vision Assessment?: No apparent visual deficits     Perception     Praxis      Pertinent Vitals/Pain Pain Assessment Pain Assessment: 0-10 Pain Score: 8  Pain Location: abdomen and lower legs Pain Descriptors / Indicators: Tightness Pain Intervention(s): Limited activity within patient's tolerance, Monitored during session, Repositioned, Premedicated before session     Hand Dominance Right   Extremity/Trunk Assessment Upper Extremity Assessment Upper Extremity Assessment: LUE deficits/detail LUE Deficits / Details: WFL but swollen LUE Sensation: WNL LUE Coordination: WNL   Lower Extremity Assessment Lower Extremity Assessment: Defer to PT evaluation   Cervical / Trunk Assessment Cervical / Trunk Assessment: Normal   Communication Communication Communication: No difficulties   Cognition Arousal/Alertness: Awake/alert Behavior During Therapy: WFL for tasks assessed/performed Overall Cognitive Status: Within Functional Limits for tasks assessed                                       General Comments  Pt most limited by LE pain when standing    Exercises     Shoulder Instructions      Home Living Family/patient expects to be discharged to:: Private residence Living Arrangements: Children Available Help at Discharge: Available PRN/intermittently Type of Home: House Home Access: Level entry     Home Layout: One level     Bathroom Shower/Tub: Tub/shower unit;Curtain   Biochemist, clinical: Standard     Home Equipment: None   Additional Comments: son works 4 hrs/day      Prior Functioning/Environment Prior Level of Function : Independent/Modified Independent             Mobility Comments: works at [employer] ADLs Comments: Takes a long time but is independent        OT Problem  List: Decreased strength;Decreased activity tolerance;Impaired balance (sitting and/or standing);Decreased knowledge of use of DME or AE;Impaired UE functional use;Pain;Increased edema      OT Treatment/Interventions: Self-care/ADL training;Therapeutic activities;Balance training    OT Goals(Current goals can be found in the care plan section) Acute Rehab OT Goals Patient Stated Goal: to go home and not have pain OT Goal Formulation: With patient Time For Goal Achievement: 04/01/22 Potential to Achieve Goals: Good ADL Goals Pt Will Perform Grooming: with supervision;standing Pt Will Perform Lower Body Bathing: with supervision;sit to/from stand Pt Will Perform Lower Body Dressing: with supervision;sit to/from stand Pt Will Perform Tub/Shower Transfer: Tub transfer;with supervision;ambulating;rolling walker;3 in 1 Additional ADL Goal #1: Pt will walk to bathroom and complete all toileting with supervision.  OT Frequency: Min 2X/week    Co-evaluation              AM-PAC OT "6 Clicks" Daily Activity     Outcome Measure Help from another person eating meals?: None Help from another person taking care of personal grooming?: A Little Help from another person toileting, which includes using toliet, bedpan, or urinal?: A Little Help from another person bathing (including washing, rinsing, drying)?: A Little Help from another person to put on and taking off regular upper body clothing?: None Help from another  person to put on and taking off regular lower body clothing?: A Little 6 Click Score: 20   End of Session Equipment Utilized During Treatment: Rolling walker (2 wheels) Nurse Communication: Mobility status  Activity Tolerance: Patient limited by pain Patient left: in chair;with call bell/phone within reach  OT Visit Diagnosis: Unsteadiness on feet (R26.81)                Time: 9150-4136 OT Time Calculation (min): 15 min Charges:  OT General Charges $OT Visit: 1 Visit OT  Evaluation $OT Eval Low Complexity: 1 Low  Glenford Peers 03/18/2022, 10:19 AM

## 2022-03-18 NOTE — Progress Notes (Signed)
BLE venous duplex has been completed.   Results can be found under chart review under CV PROC. 03/18/2022 12:00 PM Shaydon Lease RVT, RDMS

## 2022-03-18 NOTE — Evaluation (Signed)
Physical Therapy Evaluation Patient Details Name: Alyssa Rhodes MRN: [id number] DOB: Mar 14, 1961 Today's Date: 03/18/2022  History of Present Illness  Pt is a 61 yo female admitted with swelling of BLEs and LUE and abdomen for last month.  Pt found to have AKI and pleural and pericardial effusions. PMH: HTN, DM, RA, lupus.  Clinical Impression  Pt admitted with above diagnosis.  Pt currently with functional limitations due to the deficits listed below (see PT Problem List). Pt will benefit from skilled PT to increase their independence and safety with mobility to allow discharge to the venue listed below.  Pt very pleasant and received pain meds earlier this morning.  Pt reports recent hx of dizziness and states spontaneous onset, reporting once happened in her car and she "passed out."  Pt limited with mobility today due to pain in bil LEs (especially posterior knees).        Recommendations for follow up therapy are one component of a multi-disciplinary discharge planning process, led by the attending physician.  Recommendations may be updated based on patient status, additional functional criteria and insurance authorization.  Follow Up Recommendations Home health PT      Assistance Recommended at Discharge Set up Supervision/Assistance  Patient can return home with the following  A little help with walking and/or transfers;Help with stairs or ramp for entrance    Equipment Recommendations Rolling walker (2 wheels)  Recommendations for Other Services       Functional Status Assessment Patient has had a recent decline in their functional status and demonstrates the ability to make significant improvements in function in a reasonable and predictable amount of time.     Precautions / Restrictions Precautions Precautions: Fall Restrictions Weight Bearing Restrictions: No      Mobility  Bed Mobility Overal bed mobility: Needs Assistance Bed Mobility: Supine to Sit     Supine to  sit: Supervision, HOB elevated          Transfers Overall transfer level: Needs assistance Equipment used: Rolling walker (2 wheels) Transfers: Sit to/from Stand, Bed to chair/wheelchair/BSC Sit to Stand: Min assist   Step pivot transfers: Min assist       General transfer comment: verbal cues for hand placement, assist for weakness and due to pain, increased bil knee pain posteriorly (L > R) limited mobility    Ambulation/Gait               General Gait Details: pt felt unable to tolerate today  Stairs            Wheelchair Mobility    Modified Rankin (Stroke Patients Only)       Balance           Standing balance support: Bilateral upper extremity supported, Reliant on assistive device for balance Standing balance-Leahy Scale: Poor                               Pertinent Vitals/Pain Pain Assessment Pain Assessment: 0-10 Pain Score: 8  Pain Location: abdomen and lower legs Pain Descriptors / Indicators: Tightness Pain Intervention(s): Repositioned, Monitored during session, Premedicated before session    Home Living Family/patient expects to be discharged to:: Private residence Living Arrangements: Children Available Help at Discharge: Available PRN/intermittently Type of Home: House Home Access: Level entry       Home Layout: One level Home Equipment: None Additional Comments: son works 4 hrs/day    Prior Function Prior Level  of Function : Independent/Modified Independent             Mobility Comments: works at [employer] ADLs Comments: Takes a long time but is independent     Journalist, newspaper   Dominant Hand: Right    Extremity/Trunk Assessment   Upper Extremity Assessment Upper Extremity Assessment: LUE deficits/detail LUE Deficits / Details: WFL but swollen LUE Sensation: WNL LUE Coordination: WNL    Lower Extremity Assessment Lower Extremity Assessment: Generalized weakness;LLE  deficits/detail LLE Deficits / Details: left posterior knee pain with mobility    Cervical / Trunk Assessment Cervical / Trunk Assessment: Normal  Communication   Communication: No difficulties  Cognition Arousal/Alertness: Awake/alert Behavior During Therapy: WFL for tasks assessed/performed Overall Cognitive Status: Within Functional Limits for tasks assessed                                          General Comments General comments (skin integrity, edema, etc.): Pt most limited by LE pain when standing    Exercises     Assessment/Plan    PT Assessment Patient needs continued PT services  PT Problem List Decreased strength;Decreased activity tolerance;Decreased mobility;Decreased balance;Decreased knowledge of use of DME;Pain       PT Treatment Interventions Gait training;DME instruction;Therapeutic exercise;Balance training;Functional mobility training;Patient/family education;Therapeutic activities    PT Goals (Current goals can be found in the Care Plan section)  Acute Rehab PT Goals PT Goal Formulation: With patient Time For Goal Achievement: 04/01/22 Potential to Achieve Goals: Good    Frequency Min 3X/week     Co-evaluation               AM-PAC PT "6 Clicks" Mobility  Outcome Measure Help needed turning from your back to your side while in a flat bed without using bedrails?: A Little Help needed moving from lying on your back to sitting on the side of a flat bed without using bedrails?: A Little Help needed moving to and from a bed to a chair (including a wheelchair)?: A Little Help needed standing up from a chair using your arms (e.g., wheelchair or bedside chair)?: A Little Help needed to walk in hospital room?: A Lot Help needed climbing 3-5 steps with a railing? : A Lot 6 Click Score: 16    End of Session Equipment Utilized During Treatment: Gait belt Activity Tolerance: Patient limited by pain Patient left: in chair;with call  bell/phone within reach (agreeable to use call bell for assist/safety) Nurse Communication: Mobility status (MD also notified of posterior knee pain) PT Visit Diagnosis: Other abnormalities of gait and mobility (R26.89)    Time: 9169-4503 PT Time Calculation (min) (ACUTE ONLY): 16 min   Charges:   PT Evaluation $PT Eval Low Complexity: 1 Low         Kati PT, DPT Physical Therapist Acute Rehabilitation Services Preferred contact method: Secure Chat Weekend Pager Only: [phone] Office: [phone]   Myrtis Hopping [place] 03/18/2022, 11:33 AM

## 2022-03-18 NOTE — Progress Notes (Addendum)
PROGRESS NOTE    JAELY Rhodes  KCL:[id number] DOB: [birth year] DOA: 03/17/2022 PCP: Doretha Sou, MD    Brief Narrative:  Alyssa Rhodes is a 61 y.o. female with medical history significant for but not limited to hypertension, uncontrolled diabetes mellitus type 2 with recent hemoglobin A1c of 13.5, dyslipidemia, history of rheumatoid arthritis, history of lupus as well as other comorbidities who presented with swelling of her left upper extremity and bilateral lower extremities and abdomen with epigastric abdominal discomfort and pain that has been going on for several months now.  Patient has a normal baseline creatinine of 0.7-0.9 and states that she has had worsening swelling and pain of her extremities as well as abdomen.  States that she vomited twice and has not really been able to eat anything given her gastric distention and epigastric pain.  She has been taking ibuprofen at least 2-3 times a day due to her pain and has been having some abdominal discomfort and diarrhea with metformin which she stopped because of this and subsequently states that her blood sugars elevated.  Given her lower extremity swelling she was seen by cardiology in outpatient setting who did an echocardiogram which showed a preserved ejection fracture with mild pericardial effusion.  She was initiated on furosemide but did not take this on a consistent basis.  She has been having worsening lower extremity edema and has been following Dr. Amil Amen for rheumatology care and had another medication that she was taken for her lupus.  She notes that she has had worsening abdominal discomfort and distention as well as lower extremity swelling and edema with no appreciable rashes.  She denies any other concerns or complaints at this time.  Given her persistence in her symptoms and lower extremity pain and abdominal pain she presented to the ED for further evaluation.  [facility] hospital[facility] was consulted for further evaluation and  will admit this patient for AKI with bilateral lower extremity edema and anasarca.  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  No obstruction or mass.   Right pleural effusion" -Nephrology has ordered complement levels, SPEP and free light chains to evaluate for acute kidney injury and to determine potential need for renal biopsy if indicated by clinical course and serologies -Nephrology recommends avoiding nephrotoxic medication and holding ARB and metformin and recommending preferred pain control with hydromorphone, fentanyl and methadone -We will need strict I's and O's  -duplex LE to r/o DVTs   Anasarca with pleural effusions and slight pericardial effusion -Likely in the setting of AKI and NSAID and sodium retention effect with ongoing ARB -Nephrology is on attempt diuresis with furosemide will need to continue monitor -Repeat chest x-ray in a.m.; heart failure has been essentially ruled out given her echocardiogram and a BNP of 25.6 -CT Scan Chest/Abd/Pelvis done and showed "Small-to-moderate bilateral pleural effusions, more so on the right side. Small pericardial effusion. There are small linear patchy densities in right upper lung  field and both lower lung fields suggesting  atelectasis. There is no evidence of intestinal  obstruction or pneumoperitoneum. There is no hydronephrosis. Small ascites. There is diffuse edema in abdominal wall suggesting anasarca.  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 Scattered calcifications are seen in aorta and major branches."   Adrenal  adenoma -As above likely incidental finding -Follow-up in outpatient setting   Hyponatremia -In the setting of AKI and anasarca and they are obtaining serum and urine osmolarity monitoring with diary this is -resolved    Epigastric pain -Could be related to gastritis due to NSAID use versus ulcerations -Placing on PPI and Carafate -Obtaining an FOBT to evaluate for any bloody stools -Given her NSAID use may need GI evaluation if hemoglobin continues to trend downwards   Diabetes mellitus type 2 -recent Hgb A1c was 13.5 -repeat hemoglobin A1c here: 7.5 -Continue monitor CBGs per protocol -We will place on sensitive NovoLog/scale insulin AC if necessary   Thrombocytosis -Likely reactive -resolved   Lupus -Carries a diagnosis of this and per the patient was recently taken off the medication and question what this was -per rheum does not have   Possible rheumatoid arthritis - follows with [facility] -She has been on methotrexate 10 mg every Wednesday which we will be held   DVT prophylaxis: heparin injection 5,000 Units Start: 03/17/22 2200 SCDs Start: 03/17/22 1800    Code Status: Full Code Family Communication:   Disposition Plan:  Level of care: Telemetry Status is: Observation The patient will require care spanning > 2 midnights and should be moved to inpatient because: needs further management of renal failure    Consultants:  renal   Subjective: C/o lower extremity pain  Objective: Vitals:   03/17/22 1715 03/17/22 2117 03/18/22 0036 03/18/22 0453  BP:  105/62 (!) 111/56 (!) 101/59  Pulse: (!) 101 (!) 109 (!) 104 97  Resp:  '17 20 19  '$ Temp:  98.6 F (37 C) 98.7 F (37.1 C) 98.5 F (36.9 C)  TempSrc:      SpO2: 100% 95% 95% 94%  Weight:      Height:        Intake/Output Summary (Last 24 hours) at 03/18/2022 1133 Last data filed at 03/18/2022 0045 Gross per 24 hour  Intake 116 ml  Output 850 ml  Net -734 ml   Filed Weights   03/17/22 0939   Weight: 63.3 kg    Examination:   General: Appearance:     Overweight female in no acute distress     Lungs:      respirations unlabored  Heart:    Normal heart rate.    MS:   All extremities are intact.    Neurologic:   Awake, alert       Data Reviewed: I have personally reviewed following labs and imaging studies  CBC: Recent Labs  Lab 03/17/22 1000 03/18/22 0528  WBC 7.7 5.5  NEUTROABS 6.5 4.0  HGB 12.9 11.7*  HCT 37.0 34.4*  MCV 91.4 95.3  PLT 437* 244   Basic Metabolic Panel: Recent Labs  Lab 03/17/22 1000 03/18/22 0528  NA 132* 136  K 4.3 3.9  CL 93* 99  CO2 22 24  GLUCOSE 126* 108*  BUN 37* 41*  CREATININE 4.05* 3.93*  CALCIUM 9.4 9.2  MG  --  2.0  PHOS  --  5.8*   GFR: Estimated Creatinine Clearance: 13.1 mL/min (A) (by C-G formula based on SCr of 3.93 mg/dL (H)). Liver Function Tests: Recent Labs  Lab 03/17/22 1000 03/18/22 0528  AST 24 21  ALT 17 18  ALKPHOS 52 45  BILITOT 0.8 0.8  PROT 6.9 6.0*  ALBUMIN 3.6  3.4*   Recent Labs  Lab 03/17/22 1000  LIPASE 28   No results for input(s): "AMMONIA" in the last 168 hours. Coagulation Profile: No results for input(s): "INR", "PROTIME" in the last 168 hours. Cardiac Enzymes: No results for input(s): "CKTOTAL", "CKMB", "CKMBINDEX", "TROPONINI" in the last 168 hours. BNP (last 3 results) No results for input(s): "PROBNP" in the last 8760 hours. HbA1C: Recent Labs    03/18/22 0528  HGBA1C 7.5*   CBG: Recent Labs  Lab 03/17/22 1714 03/17/22 1719 03/18/22 0822  GLUCAP <10* 73 107*   Lipid Profile: No results for input(s): "CHOL", "HDL", "LDLCALC", "TRIG", "CHOLHDL", "LDLDIRECT" in the last 72 hours. Thyroid Function Tests: Recent Labs    03/18/22 0528  TSH 1.199   Anemia Panel: No results for input(s): "VITAMINB12", "FOLATE", "FERRITIN", "TIBC", "IRON", "RETICCTPCT" in the last 72 hours. Sepsis Labs: No results for input(s): "PROCALCITON", "LATICACIDVEN" in the last 168  hours.  No results found for this or any previous visit (from the past 240 hour(s)).       Radiology Studies: US Renal  Result Date: 03/17/2022 CLINICAL DATA:  Acute kidney injury EXAM: RENAL / URINARY TRACT ULTRASOUND COMPLETE COMPARISON:  CT abdomen pelvis 03/17/2022 FINDINGS: Right Kidney: Renal measurements: 10.6 x 3.8 x 4.8 cm = volume: 100 mL. Echogenicity within normal limits. No mass or hydronephrosis visualized. Left Kidney: Renal measurements: 9.6 x 4.6 x 4.3 cm = volume: 98 mL. Echogenicity within normal limits. No mass or hydronephrosis visualized. Bladder: Appears normal for degree of bladder distention. Other: Right pleural effusion noted. IMPRESSION: Decreased renal volume bilaterally.  No obstruction or mass. Right pleural effusion Electronically Signed   By: Franchot Gallo M.D.   On: 03/17/2022 12:21   CT CHEST ABDOMEN PELVIS WO CONTRAST  Result Date: 03/17/2022 CLINICAL DATA:  Abdominal pain, dizziness, vomiting EXAM: CT CHEST, ABDOMEN AND PELVIS WITHOUT CONTRAST TECHNIQUE: Multidetector CT imaging of the chest, abdomen and pelvis was performed following the standard protocol without IV contrast. RADIATION DOSE REDUCTION: This exam was performed according to the departmental dose-optimization program which includes automated exposure control, adjustment of the mA and/or kV according to patient size and/or use of iterative reconstruction technique. COMPARISON:  CT abdomen and pelvis done on 02/24/2022 FINDINGS: CT CHEST FINDINGS Cardiovascular: There are scattered calcifications seen thoracic aorta and its major branches. Small pericardial effusion is seen. Mediastinum/Nodes: No significant lymphadenopathy is seen. Lungs/Pleura: Small linear densities are seen in the lateral aspect of right upper lung field and posterior aspects of both lower lung fields. Small to moderate bilateral pleural effusions are seen, more so on the right side. There is no pneumothorax. Musculoskeletal: No  acute findings are seen. CT ABDOMEN PELVIS FINDINGS Hepatobiliary: No focal abnormalities are seen in liver. There is no dilation of bile ducts. Gallbladder is unremarkable. Pancreas: No focal abnormalities are seen. Spleen: Spleen appears smaller than usual in size. Adrenals/Urinary Tract: There is 1.4 cm low-density nodule in right adrenal with no significant change suggesting possible adenoma. No follow-up is recommended. There is no hydronephrosis. There are no renal or ureteral stones. Urinary bladder is not distended. Stomach/Bowel: Stomach is unremarkable. Small bowel loops are not dilated. Appendix is difficult to visualize. In image 86 of series 2, there is a small caliber tubular structure in right lower quadrant, possibly normal appendix. There is no focal pericecal inflammation. There is no significant wall thickening in colon. There is no pericolic stranding. Vascular/Lymphatic: Scattered arterial calcifications are seen. Reproductive: Uterus is not seen. Other: Small  ascites is seen in pelvis. There is no pneumoperitoneum. Umbilical hernia with possible small amount of fluid is noted. There is diffuse edema in subcutaneous plane suggesting anasarca. Musculoskeletal: No acute findings are seen. IMPRESSION: Small-to-moderate bilateral pleural effusions, more so on the right side. Small pericardial effusion. There are small linear patchy densities in right upper lung field and both lower lung fields suggesting atelectasis. There is no evidence of intestinal obstruction or pneumoperitoneum. There is no hydronephrosis. Small ascites. There is diffuse edema in abdominal wall suggesting anasarca. 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 Scattered calcifications are seen in aorta and major branches. Electronically Signed   By: Elmer Picker M.D.   On: 03/17/2022 11:43        Scheduled Meds:  atorvastatin  40 mg Oral Daily   folic acid  1 mg Oral Daily   heparin  5,000  Units Subcutaneous Q8H   multivitamin with minerals  1 tablet Oral Daily   pantoprazole  40 mg Oral Daily   sucralfate  1 g Oral TID WC & HS   Continuous Infusions:   LOS: 0 days    Time spent: 45 minutes spent on chart review, discussion with nursing staff, consultants, updating family and interview/physical exam; more than 50% of that time was spent in counseling and/or coordination of care.    Geradine Girt, DO [facility] Available via Epic secure chat 7am-7pm After these hours, please refer to coverage provider listed on [email or web address] 03/18/2022, 11:33 AM

## 2022-03-18 NOTE — Progress Notes (Signed)
[facility] Progress Note  Subjective: had 950 cc UOP recorded yest and overnight and today. Creat unchanged at 3.9 today.  BP's down today in 100 - 110 range SBP.  HR up 100-110, sinus tach. Is on RA.    Vitals:   03/17/22 1715 03/17/22 2117 03/18/22 0036 03/18/22 0453  BP:  105/62 (!) 111/56 (!) 101/59  Pulse: (!) 101 (!) 109 (!) 104 97  Resp:  '17 20 19  '$ Temp:  98.6 F (37 C) 98.7 F (37.1 C) 98.5 F (36.9 C)  TempSrc:      SpO2: 100% 95% 95% 94%  Weight:      Height:        Exam: Chronically ill appearing No JVD Chest  no rales or wheezing Cor reg no RG ABd soft, distended, mild tenderness in epigastric area Ext 3+ bilat LE edema Neuro Ox 3, nonfocal     UA 8/24- 0-5 rbc, 11-20 wbc, rare bact, prot neg    UNa - pend    UCreat 88    UOsm 226     Na 136  today, K+ 3.9  CO2 24 BUN 41  creat 3.93  Ca 9.2  Alb 3.4    WBC 5K Hb 11.7   TSH 1.13 Aug 2015 - ANA +speckled, titer >= 1:1280       Jan 2017 - RF = +46 (15-41 = low, >= 42 is high)        Assessment/ Plan Acute kidney injury - suspect hemodynamic in setting of anasarca, decreased EABV, renal hypoperfusion/ NSAID use w/ ongoing ARB.  Other potential dx's are autoimmune, paraproteinemia. Albumin dropped in last 2 mos but no sig proteinuria, so not sure this is neph syndrome. Have ordered serologies and complements, myeloma labs. Cont to hold ARB and nsaids.  Anasarca: Likely associated with acute kidney injury and sodium retention effect of NSAIDs with ongoing ARB use.  Will attempt diuresis with furosemide  Hyponatremia: Likely secondary to impaired free water handling in the setting of acute kidney injury/anasarca.  Will obtain serum and urine osmolality and monitor with ongoing diuresis.  Ruling out paraproteinemia.  IV lasix started on 8/24 for vol overload.  Epigastric pain: Based on the timeline of events/presenting symptoms, suspect gastritis associated with NSAID use.  Medical management per  primary service with H2 RA +/- Carafate (the latter for maximum 72 hours use in setting of acute kidney injury to limit aluminum toxicity). Hypertension: Hold ARB at this time and monitor with ongoing diuresis, may use beta-blocker or calcium channel for for short-term blood pressure control. 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 She had been on meloxicam at some point in the past.   Kelly Splinter MD 03/18/2022, 11:28 AM   Recent Labs  Lab 03/17/22 1000 03/18/22 0528  HGB 12.9 11.7*  ALBUMIN 3.6 3.4*  CALCIUM 9.4 9.2  PHOS  --  5.8*  CREATININE 4.05* 3.93*  K 4.3 3.9   No results for input(s): "IRON", "TIBC", "FERRITIN" in the last 168 hours. Inpatient medications:  atorvastatin  40 mg Oral Daily   folic acid  1 mg Oral Daily   heparin  5,000 Units Subcutaneous Q8H   multivitamin with minerals  1 tablet Oral Daily   pantoprazole  40 mg Oral Daily   sucralfate  1 g Oral TID WC &  HS    acetaminophen **OR** acetaminophen, fentaNYL (SUBLIMAZE) injection, hydrALAZINE, ondansetron **OR** ondansetron (ZOFRAN) IV, oxyCODONE

## 2022-03-18 NOTE — Plan of Care (Signed)

## 2022-03-19 DIAGNOSIS — N179 Acute kidney failure, unspecified: Secondary | ICD-10-CM | POA: Diagnosis not present

## 2022-03-19 LAB — RENAL FUNCTION PANEL
Albumin: 3 g/dL — ABNORMAL LOW (ref 3.5–5.0)
Anion gap: 9 (ref 5–15)
BUN: 47 mg/dL — ABNORMAL HIGH (ref 8–23)
CO2: 30 mmol/L (ref 22–32)
Calcium: 8.7 mg/dL — ABNORMAL LOW (ref 8.9–10.3)
Chloride: 98 mmol/L (ref 98–111)
Creatinine, Ser: 3.68 mg/dL — ABNORMAL HIGH (ref 0.44–1.00)
GFR, Estimated: 13 mL/min — ABNORMAL LOW (ref >60)
Glucose, Bld: 129 mg/dL — ABNORMAL HIGH (ref 70–99)
Phosphorus: 5.7 mg/dL — ABNORMAL HIGH (ref 2.5–4.6)
Potassium: 3.9 mmol/L (ref 3.5–5.1)
Sodium: 137 mmol/L (ref 135–145)

## 2022-03-19 LAB — SODIUM, URINE, RANDOM: Sodium, Ur: 78 mmol/L

## 2022-03-19 LAB — GLUCOSE, CAPILLARY: Glucose-Capillary: 144 mg/dL — ABNORMAL HIGH (ref 70–99)

## 2022-03-19 MED ORDER — FUROSEMIDE 10 MG/ML IJ SOLN
80.0000 mg | Freq: Two times a day (BID) | INTRAMUSCULAR | Status: DC
Start: 2022-03-19 — End: 2022-03-19

## 2022-03-19 MED ORDER — DICLOFENAC SODIUM 1 % EX GEL
2.0000 g | Freq: Four times a day (QID) | CUTANEOUS | Status: DC
  Administered 2022-03-19 – 2022-03-28 (×34): 2 g via TOPICAL
  Filled 2022-03-19 (×2): qty 100

## 2022-03-19 MED ORDER — FUROSEMIDE 10 MG/ML IJ SOLN
100.0000 mg | Freq: Two times a day (BID) | INTRAVENOUS | Status: DC
  Administered 2022-03-19: 100 mg via INTRAVENOUS
  Filled 2022-03-19 (×2): qty 10

## 2022-03-19 MED ORDER — FUROSEMIDE 10 MG/ML IJ SOLN
120.0000 mg | Freq: Three times a day (TID) | INTRAVENOUS | Status: DC
  Administered 2022-03-19 – 2022-03-23 (×10): 120 mg via INTRAVENOUS
  Filled 2022-03-19 (×2): qty 12
  Filled 2022-03-19: qty 10
  Filled 2022-03-19 (×6): qty 12
  Filled 2022-03-19: qty 10
  Filled 2022-03-19: qty 2
  Filled 2022-03-19 (×4): qty 12
  Filled 2022-03-19: qty 10
  Filled 2022-03-19: qty 4

## 2022-03-19 MED ORDER — METOLAZONE 2.5 MG PO TABS
2.5000 mg | ORAL_TABLET | Freq: Every day | ORAL | Status: DC
  Administered 2022-03-19 – 2022-03-20 (×2): 2.5 mg via ORAL
  Filled 2022-03-19 (×2): qty 1

## 2022-03-19 NOTE — Progress Notes (Addendum)
[facility] Progress Note  Subjective: 1100 UOP yest and 500 today so far. BP's are soft. Not on any bp lowering meds.   Vitals:   03/18/22 1342 03/18/22 2142 03/19/22 0500 03/19/22 0523  BP: 113/60 (!) 100/56  (!) 91/53  Pulse: 91 86  83  Resp: '17 18  18  ' Temp: 97.9 F (36.6 C) 98.2 F (36.8 C)  98 F (36.7 C)  TempSrc: Oral Oral  Oral  SpO2: 97% 95%  95%  Weight:   63 kg   Height:        Exam: Chronically ill appearing No JVD Chest  no rales or wheezing Cor reg no RG ABd soft, distended, mild tenderness in epigastric area Ext 2- 3+ bilat LE and UE edema Neuro Ox 3, nonfocal    Home meds: atrovastatin, estradiol, methotrexate 59m q wed, MVI, olmesartan 40 qd, buproprion, chlorthalidone 25 qd, furosemide 40 qd      UA 8/24- 0-5 rbc, 11-20 wbc, rare bact, prot neg    UNa - pend    UCreat 88    UOsm 226     Na 136  today, K+ 3.9  CO2 24 BUN 41  creat 3.93  Ca 9.2  Alb 3.4    WBC 5K Hb 11.7   TSH 1.13 Aug 2015 - ANA +speckled, titer >= 1:1280       Jan 2017 - RF = +46 (15-41 = low, >= 42 is high)       Na 136  K3.9  CO2 24  BUN 41  Cr 3.93  Alb 3.4  phos 5.8  Tprot 6.0        eGFR 12       HIV neg  Assessment/ Plan Acute kidney injury - suspect hemodynamic in setting of anasarca, decreased EABV, renal hypoperfusion/ NSAID use + ARB effect.  Other potential dx's are autoimmune, paraproteinemia.  Albumin dropped in last 2 mos but no sig proteinuria, so not sure this is neph syndrome. Have ordered complements, myeloma labs. Will order serologies as well. Cont to hold ARB and nsaids. Started IV lasix 80 bid and not responding very well. Will ^120 mg IV tid today and add zaroxolyn 2.5 daily. Creat stable. If no improvement over the weekend will ask IR to see about kidney biopsy.  Anasarca: Likely associated with acute kidney injury and sodium retention effect of NSAIDs with ongoing ARB use.  Attempting diuresis with lasix as above Epigastric pain: possible  gastritis associated with NSAID use.  Medical management per primary service with H2 RA +/- Carafate (the latter for maximum 72 hours use in setting of acute kidney injury to limit aluminum toxicity). Hypertension: no ARB/ acei for this pt w/ AKI. BP's soft, not on any BP lowering meds.  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 She had been on meloxicam at some point in the past.   RKelly SplinterMD 03/19/2022, 1:35 PM    Recent Labs  Lab 03/17/22 1000 03/18/22 0528  HGB 12.9 11.7*  ALBUMIN 3.6 3.4*  CALCIUM 9.4 9.2  PHOS  --  5.8*  CREATININE 4.05* 3.93*  K 4.3 3.9    No results for input(s): "IRON", "TIBC", "FERRITIN" in the last 168 hours. Inpatient medications:  atorvastatin  40 mg Oral Daily   folic acid  1 mg Oral Daily  heparin  5,000 Units Subcutaneous Q8H   multivitamin with minerals  1 tablet Oral Daily   pantoprazole  40 mg Oral Daily   sucralfate  1 g Oral TID WC & HS    acetaminophen **OR** acetaminophen, fentaNYL (SUBLIMAZE) injection, hydrALAZINE, ondansetron **OR** ondansetron (ZOFRAN) IV, oxyCODONE

## 2022-03-19 NOTE — Progress Notes (Signed)
PROGRESS NOTE    Alyssa Rhodes  IEP:[id number] DOB: 11/27/1960 DOA: 03/17/2022 PCP: Doretha Sou, MD    Brief Narrative:  Alyssa Rhodes is a 61 y.o. female with medical history significant for but not limited to hypertension, uncontrolled diabetes mellitus type 2 with recent hemoglobin A1c of 13.5, dyslipidemia, history of rheumatoid arthritis, history of lupus as well as other comorbidities who presented with swelling of her left upper extremity and bilateral lower extremities and abdomen with epigastric abdominal discomfort and pain that has been going on for several months now.  Patient has a normal baseline creatinine of 0.7-0.9 and states that she has had worsening swelling and pain of her extremities as well as abdomen.  States that she vomited twice and has not really been able to eat anything given her gastric distention and epigastric pain.  She has been taking ibuprofen at least 2-3 times a day due to her pain and has been having some abdominal discomfort and diarrhea with metformin which she stopped because of this and subsequently states that her blood sugars elevated.  Given her lower extremity swelling she was seen by cardiology in outpatient setting who did an echocardiogram which showed a preserved ejection fracture with mild pericardial effusion.  She was initiated on furosemide but did not take this on a consistent basis.  She has been having worsening lower extremity edema and has been following Dr. Amil Amen for rheumatology care and had another medication that she was taken for her lupus.  She notes that she has had worsening abdominal discomfort and distention as well as lower extremity swelling and edema with no appreciable rashes.  She denies any other concerns or complaints at this time.  Given her persistence in her symptoms and lower extremity pain and abdominal pain she presented to the ED for further evaluation.  [facility] hospital[facility] was consulted for further evaluation and  will admit this patient for AKI with bilateral lower extremity edema and anasarca.    Assessment and Plan:  AKI -Likely in the setting of worsening NSAIDs along with her nephrotoxic medications including Benicar and furosemide as well as chlorthalidone -Baseline creatinine ranges from 0.7-0.9 and she presented with a BUN/creatinine of 37/4.05- -lasix held recently at [facility] due to a Cr of 2 -Renal ultrasound done and showed "Decreased renal volume bilaterally.  No obstruction or mass.  Right pleural effusion" -Nephrology following and consult appreciated- may need renal biopsy -strict I's and O's - down 2.6L -duplex LE negative   Anasarca with pleural effusions and slight pericardial effusion -Likely in the setting of AKI and NSAID and sodium retention effect with ongoing ARB -Nephrology is on attempt diuresis with furosemide will need to continue monitor - heart failure has been essentially ruled out given her echocardiogram and a BNP of 25.6 -CT Scan Chest/Abd/Pelvis done and showed "Small-to-moderate bilateral pleural effusions, more so on the right side. Small pericardial effusion. There are small linear patchy densities in right upper lung  field and both lower lung fields suggesting  atelectasis. There is no evidence of intestinal  obstruction or pneumoperitoneum. There is no hydronephrosis. Small ascites. There is diffuse edema in abdominal wall suggesting anasarca.   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 Scattered calcifications are seen in aorta and major branches.   Adrenal adenoma -As above likely incidental finding -Follow-up in outpatient setting   Hyponatremia -resolved    Epigastric pain -Could be related to gastritis due to NSAID use versus ulcerations -Placing on  PPI and Carafate -Obtaining an FOBT to evaluate for any bloody stools -trend CBC   Diabetes mellitus type 2 -recent Hgb A1c was 13.5 -repeat hemoglobin A1c here: 7.5 -Continue  monitor CBGs per protocol -SSI   Thrombocytosis -resolved- likely reactive   Lupus -Carries a diagnosis of this and per the patient was recently taken off the medication and question what this was -per rheum does not have   Possible rheumatoid arthritis - follows with [facility] -She has been on methotrexate 10 mg every Wednesday which we will be held   DVT prophylaxis: heparin injection 5,000 Units Start: 03/17/22 2200 SCDs Start: 03/17/22 1800    Code Status: Full Code Family Communication:   Disposition Plan:  Level of care: Telemetry Status is: inpt    Consultants:  renal   Subjective: C/o lower extremity pain  Objective: Vitals:   03/19/22 0500 03/19/22 0523 03/19/22 0852 03/19/22 1319  BP:  (!) '91/53 98/61 98/65 '$  Pulse:  83 86 80  Resp:  '18 18 16  '$ Temp:  98 F (36.7 C) 97.8 F (36.6 C) 98.5 F (36.9 C)  TempSrc:  Oral Oral Oral  SpO2:  95%  94%  Weight: 63 kg     Height:        Intake/Output Summary (Last 24 hours) at 03/19/2022 1351 Last data filed at 03/19/2022 1300 Gross per 24 hour  Intake 720 ml  Output 1100 ml  Net -380 ml   Filed Weights   03/17/22 0939 03/19/22 0500  Weight: 63.3 kg 63 kg    Examination:   General: Appearance:     Overweight female in no acute distress     Lungs:      respirations unlabored  Heart:    Normal heart rate.    MS:   All extremities are intact.    Neurologic:   Awake, alert       Data Reviewed: I have personally reviewed following labs and imaging studies  CBC: Recent Labs  Lab 03/17/22 1000 03/18/22 0528  WBC 7.7 5.5  NEUTROABS 6.5 4.0  HGB 12.9 11.7*  HCT 37.0 34.4*  MCV 91.4 95.3  PLT 437* 144   Basic Metabolic Panel: Recent Labs  Lab 03/17/22 1000 03/18/22 0528 03/19/22 0750  NA 132* 136 137  K 4.3 3.9 3.9  CL 93* 99 98  CO2 '22 24 30  '$ GLUCOSE 126* 108* 129*  BUN 37* 41* 47*  CREATININE 4.05* 3.93* 3.68*  CALCIUM 9.4 9.2 8.7*  MG  --  2.0  --   PHOS  --  5.8*  5.7*   GFR: Estimated Creatinine Clearance: 14 mL/min (A) (by C-G formula based on SCr of 3.68 mg/dL (H)). Liver Function Tests: Recent Labs  Lab 03/17/22 1000 03/18/22 0528 03/19/22 0750  AST 24 21  --   ALT 17 18  --   ALKPHOS 52 45  --   BILITOT 0.8 0.8  --   PROT 6.9 6.0*  --   ALBUMIN 3.6 3.4* 3.0*   Recent Labs  Lab 03/17/22 1000  LIPASE 28   No results for input(s): "AMMONIA" in the last 168 hours. Coagulation Profile: No results for input(s): "INR", "PROTIME" in the last 168 hours. Cardiac Enzymes: No results for input(s): "CKTOTAL", "CKMB", "CKMBINDEX", "TROPONINI" in the last 168 hours. BNP (last 3 results) No results for input(s): "PROBNP" in the last 8760 hours. HbA1C: Recent Labs    03/18/22 0528  HGBA1C 7.5*   CBG: Recent Labs  Lab 03/17/22  1714 03/17/22 1719 03/18/22 0822 03/19/22 0736  GLUCAP <10* 73 107* 144*   Lipid Profile: No results for input(s): "CHOL", "HDL", "LDLCALC", "TRIG", "CHOLHDL", "LDLDIRECT" in the last 72 hours. Thyroid Function Tests: Recent Labs    03/18/22 0528  TSH 1.199   Anemia Panel: No results for input(s): "VITAMINB12", "FOLATE", "FERRITIN", "TIBC", "IRON", "RETICCTPCT" in the last 72 hours. Sepsis Labs: No results for input(s): "PROCALCITON", "LATICACIDVEN" in the last 168 hours.  No results found for this or any previous visit (from the past 240 hour(s)).       Radiology Studies: VAS [place] LOWER EXTREMITY VENOUS (DVT)  Result Date: 03/18/2022  Lower Venous DVT Study Patient Name:  GINELLE BAYS  Date of Exam:   03/18/2022 Medical Rec #: [id number]     Accession #:    [id number] Date of Birth: 05/13/1961     Patient Gender: F Patient Age:   20 years Exam Location:  [facility] Procedure:      VAS [place] LOWER EXTREMITY VENOUS (DVT) Referring Phys: Eulogio Bear --------------------------------------------------------------------------------  Indications: Edema.  Risk Factors: Lupus. Limitations: Poor  ultrasound/tissue interface. Comparison Study: No previous exams Performing Technologist: Jody Hill RVT, RDMS  Examination Guidelines: A complete evaluation includes B-mode imaging, spectral Doppler, color Doppler, and power Doppler as needed of all accessible portions of each vessel. Bilateral testing is considered an integral part of a complete examination. Limited examinations for reoccurring indications may be performed as noted. The reflux portion of the exam is performed with the patient in reverse Trendelenburg.  +--------+---------------+---------+-----------+----------+--------------------+ RIGHT   CompressibilityPhasicitySpontaneityPropertiesThrombus Aging       +--------+---------------+---------+-----------+----------+--------------------+ CFV     Full           Yes      Yes                                       +--------+---------------+---------+-----------+----------+--------------------+ SFJ     Full                                                              +--------+---------------+---------+-----------+----------+--------------------+ FV Prox Full           Yes      Yes                                       +--------+---------------+---------+-----------+----------+--------------------+ FV Mid  Full           Yes      Yes                                       +--------+---------------+---------+-----------+----------+--------------------+ FV      Full           Yes      Yes                                       Distal                                                                    +--------+---------------+---------+-----------+----------+--------------------+  PFV                    Yes      Yes                  patent by                                                                 color/doppler        +--------+---------------+---------+-----------+----------+--------------------+ POP     Full           Yes      Yes                                        +--------+---------------+---------+-----------+----------+--------------------+ PTV     Full                                                              +--------+---------------+---------+-----------+----------+--------------------+ PERO    Full                                                              +--------+---------------+---------+-----------+----------+--------------------+ Difficulty compressing CFV/SFJ due to patient bearing down due to discomfort.  +--------+---------------+---------+-----------+----------+--------------------+ LEFT    CompressibilityPhasicitySpontaneityPropertiesThrombus Aging       +--------+---------------+---------+-----------+----------+--------------------+ CFV     Full           Yes      Yes                                       +--------+---------------+---------+-----------+----------+--------------------+ SFJ     Full                                                              +--------+---------------+---------+-----------+----------+--------------------+ FV Prox Full           Yes      Yes                                       +--------+---------------+---------+-----------+----------+--------------------+ FV Mid  Full           Yes      Yes                                       +--------+---------------+---------+-----------+----------+--------------------+ FV  Yes      Yes                  patent by            Distal                                               color/doppler        +--------+---------------+---------+-----------+----------+--------------------+ PFV     Full           Yes      Yes                                       +--------+---------------+---------+-----------+----------+--------------------+ POP     Full           Yes      Yes                                        +--------+---------------+---------+-----------+----------+--------------------+ PTV     Full                                                              +--------+---------------+---------+-----------+----------+--------------------+ PERO    Full                                                              +--------+---------------+---------+-----------+----------+--------------------+ Difficulty compressing CFV/SFJ due to patient bearing down due to discomfort.   Summary: BILATERAL: - No evidence of deep vein thrombosis seen in the lower extremities, bilaterally. -No evidence of popliteal cyst, bilaterally. -Diffuse subcutaneous edema, bilaterally.   *See table(s) above for measurements and observations.    Preliminary         Scheduled Meds:  atorvastatin  40 mg Oral Daily   diclofenac Sodium  2 g Topical QID   folic acid  1 mg Oral Daily   heparin  5,000 Units Subcutaneous Q8H   metolazone  2.5 mg Oral Daily   multivitamin with minerals  1 tablet Oral Daily   pantoprazole  40 mg Oral Daily   sucralfate  1 g Oral TID WC & HS   Continuous Infusions:  furosemide       LOS: 1 day    Time spent: 45 minutes spent on chart review, discussion with nursing staff, consultants, updating family and interview/physical exam; more than 50% of that time was spent in counseling and/or coordination of care.    Geradine Girt, DO [facility] Available via Epic secure chat 7am-7pm After these hours, please refer to coverage provider listed on [email or web address] 03/19/2022, 1:51 PM

## 2022-03-19 NOTE — Progress Notes (Signed)
OT Cancellation Note  Patient Details Name: MAHOGANIE BASHER MRN: [id number] DOB: [birth year]   Cancelled Treatment:    Reason Eval/Treat Not Completed: Other (comment). Patient nauseated and in pain. Will f/u as able.  Alette Kataoka L Marilyne Haseley 03/19/2022, 12:42 PM

## 2022-03-19 NOTE — Plan of Care (Signed)
Pt continues to have severe pain in legs while on current pain regimen. Problem: Education: Goal: Knowledge of General Education information will improve Description: Including pain rating scale, medication(s)/side effects and non-pharmacologic comfort measures Outcome: Progressing   Problem: Health Behavior/Discharge Planning: Goal: Ability to manage health-related needs will improve Outcome: Progressing   Problem: Clinical Measurements: Goal: Ability to maintain clinical measurements within normal limits will improve Outcome: Progressing Goal: Will remain free from infection Outcome: Progressing Goal: Diagnostic test results will improve Outcome: Progressing Goal: Respiratory complications will improve Outcome: Progressing Goal: Cardiovascular complication will be avoided Outcome: Progressing   Problem: Activity: Goal: Risk for activity intolerance will decrease Outcome: Progressing   Problem: Nutrition: Goal: Adequate nutrition will be maintained Outcome: Progressing   Problem: Coping: Goal: Level of anxiety will decrease Outcome: Progressing   Problem: Elimination: Goal: Will not experience complications related to bowel motility Outcome: Progressing Goal: Will not experience complications related to urinary retention Outcome: Progressing   Problem: Pain Managment: Goal: General experience of comfort will improve Outcome: Progressing   Problem: Safety: Goal: Ability to remain free from injury will improve Outcome: Progressing   Problem: Skin Integrity: Goal: Risk for impaired skin integrity will decrease Outcome: Progressing

## 2022-03-20 DIAGNOSIS — N179 Acute kidney failure, unspecified: Secondary | ICD-10-CM | POA: Diagnosis not present

## 2022-03-20 LAB — CBC
HCT: 33.5 % — ABNORMAL LOW (ref 36.0–46.0)
Hemoglobin: 11.6 g/dL — ABNORMAL LOW (ref 12.0–15.0)
MCH: 32.6 pg (ref 26.0–34.0)
MCHC: 34.6 g/dL (ref 30.0–36.0)
MCV: 94.1 fL (ref 80.0–100.0)
Platelets: 385 10*3/uL (ref 150–400)
RBC: 3.56 MIL/uL — ABNORMAL LOW (ref 3.87–5.11)
RDW: 14.4 % (ref 11.5–15.5)
WBC: 4.4 10*3/uL (ref 4.0–10.5)
nRBC: 0 % (ref 0.0–0.2)

## 2022-03-20 LAB — RENAL FUNCTION PANEL
Albumin: 3.2 g/dL — ABNORMAL LOW (ref 3.5–5.0)
Anion gap: 12 (ref 5–15)
BUN: 41 mg/dL — ABNORMAL HIGH (ref 8–23)
CO2: 28 mmol/L (ref 22–32)
Calcium: 8.9 mg/dL (ref 8.9–10.3)
Chloride: 95 mmol/L — ABNORMAL LOW (ref 98–111)
Creatinine, Ser: 2.79 mg/dL — ABNORMAL HIGH (ref 0.44–1.00)
GFR, Estimated: 19 mL/min — ABNORMAL LOW (ref >60)
Glucose, Bld: 116 mg/dL — ABNORMAL HIGH (ref 70–99)
Phosphorus: 5 mg/dL — ABNORMAL HIGH (ref 2.5–4.6)
Potassium: 3.3 mmol/L — ABNORMAL LOW (ref 3.5–5.1)
Sodium: 135 mmol/L (ref 135–145)

## 2022-03-20 LAB — HEPATITIS PANEL, ACUTE
HCV Ab: NONREACTIVE
Hep A IgM: NONREACTIVE
Hep B C IgM: NONREACTIVE
Hepatitis B Surface Ag: NONREACTIVE

## 2022-03-20 LAB — C3 COMPLEMENT: C3 Complement: 116 mg/dL (ref 82–167)

## 2022-03-20 LAB — C4 COMPLEMENT: Complement C4, Body Fluid: 34 mg/dL (ref 12–38)

## 2022-03-20 MED ORDER — METOLAZONE 5 MG PO TABS
5.0000 mg | ORAL_TABLET | Freq: Every day | ORAL | Status: DC
  Administered 2022-03-21 – 2022-03-24 (×3): 5 mg via ORAL
  Filled 2022-03-20 (×4): qty 1

## 2022-03-20 MED ORDER — POTASSIUM CHLORIDE CRYS ER 20 MEQ PO TBCR
40.0000 meq | EXTENDED_RELEASE_TABLET | Freq: Once | ORAL | Status: AC
Start: 2022-03-20 — End: 2022-03-20
  Administered 2022-03-20: 40 meq via ORAL
  Filled 2022-03-20: qty 2

## 2022-03-20 NOTE — Progress Notes (Signed)
PROGRESS NOTE    Alyssa Rhodes  WUJ:[id number] DOB: 12/26/1960 DOA: 03/17/2022 PCP: Doretha Sou, MD    Brief Narrative:  Alyssa Rhodes is a 61 y.o. female with medical history significant for but not limited to hypertension, uncontrolled diabetes mellitus type 2 with recent hemoglobin A1c of 13.5, dyslipidemia, history of rheumatoid arthritis, history of lupus as well as other comorbidities who presented with swelling of her left upper extremity and bilateral lower extremities and abdomen with epigastric abdominal discomfort and pain that has been going on for several months now.  Patient has a normal baseline creatinine of 0.7-0.9 and states that she has had worsening swelling and pain of her extremities as well as abdomen.  States that she vomited twice and has not really been able to eat anything given her gastric distention and epigastric pain.  She has been taking ibuprofen at least 2-3 times a day due to her pain and has been having some abdominal discomfort and diarrhea with metformin which she stopped because of this and subsequently states that her blood sugars elevated.  Given her lower extremity swelling she was seen by cardiology in outpatient setting who did an echocardiogram which showed a preserved ejection fracture with mild pericardial effusion.  She was initiated on furosemide but did not take this on a consistent basis.  She has been having worsening lower extremity edema and has been following Dr. Amil Amen for rheumatology care and had another medication that she was taken for her lupus.  She notes that she has had worsening abdominal discomfort and distention as well as lower extremity swelling and edema with no appreciable rashes.  She denies any other concerns or complaints at this time.  Given her persistence in her symptoms and lower extremity pain and abdominal pain she presented to the ED for further evaluation.  [facility] was consulted for further evaluation and  will admit this patient for AKI with bilateral lower extremity edema and anasarca.    Assessment and Plan:  AKI -Likely in the setting of worsening NSAIDs along with her nephrotoxic medications including Benicar and furosemide as well as chlorthalidone -Baseline creatinine ranges from 0.7-0.9 and she presented with a BUN/creatinine of 37/4.05- -lasix held recently at [facility] due to a Cr of 2 -Renal ultrasound done and showed "Decreased renal volume bilaterally.  No obstruction or mass.  Right pleural effusion" -Nephrology following and consult appreciated- may need renal biopsy -strict I's and O's - down 2.2L -duplex LE negative -Cr improving   Anasarca with pleural effusions and slight pericardial effusion -Likely in the setting of AKI and NSAID and sodium retention effect with ongoing ARB -Nephrology is on attempt diuresis with furosemide will need to continue monitor - heart failure has been essentially ruled out given her echocardiogram and a BNP of 25.6 -CT Scan Chest/Abd/Pelvis done and showed "Small-to-moderate bilateral pleural effusions, more so on the right side. Small pericardial effusion. There are small linear patchy densities in right upper lung  field and both lower lung fields suggesting  atelectasis. There is no evidence of intestinal  obstruction or pneumoperitoneum. There is no hydronephrosis. Small ascites. There is diffuse edema in abdominal wall suggesting anasarca.   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 Scattered calcifications are seen in aorta and major branches.   Adrenal adenoma -As above likely incidental finding -Follow-up in outpatient setting   Hyponatremia -resolved    Hypokalemia -replete  Epigastric pain -Could be related to gastritis due to NSAID  use versus ulcerations -Placing on PPI and Carafate -hgb stable   Diabetes mellitus type 2 -recent Hgb A1c was 13.5 -repeat hemoglobin A1c here: 7.5 -Continue monitor CBGs  per protocol -SSI   Thrombocytosis -resolved- likely reactive   Lupus -Carries a diagnosis of this and per the patient was recently taken off the medication and question what this was -per rheum does not have   Possible rheumatoid arthritis - follows with [facility] -She has been on methotrexate 10 mg every Wednesday which we will be held   DVT prophylaxis: heparin injection 5,000 Units Start: 03/17/22 2200 SCDs Start: 03/17/22 1800    Code Status: Full Code Family Communication:   Disposition Plan:  Level of care: Telemetry Status is: inpt    Consultants:  renal   Subjective: No overnight events  Objective: Vitals:   03/19/22 1319 03/19/22 2056 03/20/22 0500 03/20/22 0544  BP: 98/65 100/63  93/62  Pulse: 80 93  92  Resp: '16 18  18  '$ Temp: 98.5 F (36.9 C) 98.6 F (37 C)  98 F (36.7 C)  TempSrc: Oral Oral  Oral  SpO2: 94% 95%  93%  Weight:   66.7 kg   Height:        Intake/Output Summary (Last 24 hours) at 03/20/2022 1255 Last data filed at 03/20/2022 0900 Gross per 24 hour  Intake 822 ml  Output 1900 ml  Net -1078 ml   Filed Weights   03/17/22 0939 03/19/22 0500 03/20/22 0500  Weight: 63.3 kg 63 kg 66.7 kg    Examination:   General: Appearance:     Overweight female in no acute distress     Lungs:      respirations unlabored  Heart:    Normal heart rate.    MS:   All extremities are intact.    Neurologic:   Awake, alert       Data Reviewed: I have personally reviewed following labs and imaging studies  CBC: Recent Labs  Lab 03/17/22 1000 03/18/22 0528 03/20/22 0556  WBC 7.7 5.5 4.4  NEUTROABS 6.5 4.0  --   HGB 12.9 11.7* 11.6*  HCT 37.0 34.4* 33.5*  MCV 91.4 95.3 94.1  PLT 437* 363 371   Basic Metabolic Panel: Recent Labs  Lab 03/17/22 1000 03/18/22 0528 03/19/22 0750 03/20/22 0556  NA 132* 136 137 135  K 4.3 3.9 3.9 3.3*  CL 93* 99 98 95*  CO2 '22 24 30 28  '$ GLUCOSE 126* 108* 129* 116*  BUN 37* 41* 47* 41*   CREATININE 4.05* 3.93* 3.68* 2.79*  CALCIUM 9.4 9.2 8.7* 8.9  MG  --  2.0  --   --   PHOS  --  5.8* 5.7* 5.0*   GFR: Estimated Creatinine Clearance: 19 mL/min (A) (by C-G formula based on SCr of 2.79 mg/dL (H)). Liver Function Tests: Recent Labs  Lab 03/17/22 1000 03/18/22 0528 03/19/22 0750 03/20/22 0556  AST 24 21  --   --   ALT 17 18  --   --   ALKPHOS 52 45  --   --   BILITOT 0.8 0.8  --   --   PROT 6.9 6.0*  --   --   ALBUMIN 3.6 3.4* 3.0* 3.2*   Recent Labs  Lab 03/17/22 1000  LIPASE 28   No results for input(s): "AMMONIA" in the last 168 hours. Coagulation Profile: No results for input(s): "INR", "PROTIME" in the last 168 hours. Cardiac Enzymes: No results for input(s): "CKTOTAL", "CKMB", "  CKMBINDEX", "TROPONINI" in the last 168 hours. BNP (last 3 results) No results for input(s): "PROBNP" in the last 8760 hours. HbA1C: Recent Labs    03/18/22 0528  HGBA1C 7.5*   CBG: Recent Labs  Lab 03/17/22 1714 03/17/22 1719 03/18/22 0822 03/19/22 0736  GLUCAP <10* 73 107* 144*   Lipid Profile: No results for input(s): "CHOL", "HDL", "LDLCALC", "TRIG", "CHOLHDL", "LDLDIRECT" in the last 72 hours. Thyroid Function Tests: Recent Labs    03/18/22 0528  TSH 1.199   Anemia Panel: No results for input(s): "VITAMINB12", "FOLATE", "FERRITIN", "TIBC", "IRON", "RETICCTPCT" in the last 72 hours. Sepsis Labs: No results for input(s): "PROCALCITON", "LATICACIDVEN" in the last 168 hours.  No results found for this or any previous visit (from the past 240 hour(s)).       Radiology Studies: No results found.      Scheduled Meds:  atorvastatin  40 mg Oral Daily   diclofenac Sodium  2 g Topical QID   folic acid  1 mg Oral Daily   heparin  5,000 Units Subcutaneous Q8H   metolazone  2.5 mg Oral Daily   multivitamin with minerals  1 tablet Oral Daily   pantoprazole  40 mg Oral Daily   sucralfate  1 g Oral TID WC & HS   Continuous Infusions:  furosemide 120  mg (03/20/22 0506)     LOS: 2 days    Time spent: 45 minutes spent on chart review, discussion with nursing staff, consultants, updating family and interview/physical exam; more than 50% of that time was spent in counseling and/or coordination of care.    Geradine Girt, DO [facility] Available via Epic secure chat 7am-7pm After these hours, please refer to coverage provider listed on [email or web address] 03/20/2022, 12:55 PM

## 2022-03-20 NOTE — Progress Notes (Signed)
[facility] Progress Note  Subjective: 1600 cc UOP yesterday, 800 so far today. Creat down to 3.6 yest and 2.7 today. Pt starting to feel better today, legs are not as swollen and not as painful. Looks better.   Vitals:   03/19/22 1319 03/19/22 2056 03/20/22 0500 03/20/22 0544  BP: 98/65 100/63  93/62  Pulse: 80 93  92  Resp: '16 18  18  '$ Temp: 98.5 F (36.9 C) 98.6 F (37 C)  98 F (36.7 C)  TempSrc: Oral Oral  Oral  SpO2: 94% 95%  93%  Weight:   66.7 kg   Height:        Exam: Gen no distress, looks better today No JVD Chest  no rales or wheezing Cor reg no RG ABd soft, distended, mild tenderness in epigastric area Ext 2 + bilat LE and UE edema, improving Neuro Ox 3, nonfocal    Home meds: atrovastatin, estradiol, methotrexate '10mg'$  q wed, MVI, olmesartan 40 qd, buproprion, chlorthalidone 25 qd, furosemide 40 qd      Renal US - 10.6/ 9.6 cm kidneys w/o hydro, normal bladder    UA 8/24- 0-5 rbc, 11-20 wbc, rare bact, prot neg    UNa 78 (on diuretics)    UCreat 88    UOsm 226     Alb 3.4  WBC 5K Hb 11.7   TSH 1.13 Aug 2015 - ANA +speckled, titer >= 1:1280       Jan 2017 - RF = +46 (15-41 = low, >= 42 is high)       HIV neg, hep B/C negative, UP/C ratio = 0.22         Assessment/ Plan Acute kidney injury - suspect hemodynamic in setting of anasarca, decreased EABV, renal hypoperfusion/ NSAID use + ARB effect.  Other potential dx's are autoimmune, paraproteinemia.  Albumin dropped to mid 3s in last 2 mos but no sig proteinuria, so doesn't look like neph syndrome. Have ordered complements, myeloma labs and some serologies, all pending. Cont to hold ARB and nsaids. Started IV lasix 80 bid and did not respond, but is now doing better w/ ^120 mg IV tid + zaroxolyn daily. Edema/ pain starting to improve and creat down significantly today so will hold off on kidney biopsy plan for now. Cont diuresis. Should avoid all nsaids in the future. Will follow.   Anasarca:  Likely associated with acute kidney injury and sodium retention effect of NSAIDs with ongoing ARB use.  Starting to diurese as above.  Epigastric pain: possible gastritis associated with NSAID use.  Medical management per primary service with H2 RA +/- Carafate (the latter for maximum 72 hours use in setting of acute kidney injury to limit aluminum toxicity). Hypertension: no ARB/ acei for this pt w/ AKI. BP's soft, not on any BP lowering meds.  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 She had been on meloxicam at some point in the past.   Kelly Splinter MD 03/20/2022, 1:35 PM    Recent Labs  Lab 03/18/22 0528 03/19/22 0750 03/20/22 0556  HGB 11.7*  --  11.6*  ALBUMIN 3.4* 3.0* 3.2*  CALCIUM 9.2 8.7* 8.9  PHOS 5.8* 5.7* 5.0*  CREATININE 3.93* 3.68* 2.79*  K 3.9 3.9 3.3*    No results for input(s): "IRON", "TIBC", "FERRITIN" in the last 168 hours. Inpatient  medications:  atorvastatin  40 mg Oral Daily   diclofenac Sodium  2 g Topical QID   folic acid  1 mg Oral Daily   heparin  5,000 Units Subcutaneous Q8H   metolazone  2.5 mg Oral Daily   multivitamin with minerals  1 tablet Oral Daily   pantoprazole  40 mg Oral Daily   sucralfate  1 g Oral TID WC & HS    furosemide 120 mg (03/20/22 0506)   acetaminophen **OR** acetaminophen, fentaNYL (SUBLIMAZE) injection, hydrALAZINE, ondansetron **OR** ondansetron (ZOFRAN) IV, oxyCODONE

## 2022-03-21 ENCOUNTER — Ambulatory Visit: Payer: BC Managed Care – PPO | Admitting: Internal Medicine

## 2022-03-21 DIAGNOSIS — N179 Acute kidney failure, unspecified: Secondary | ICD-10-CM | POA: Diagnosis not present

## 2022-03-21 LAB — RENAL FUNCTION PANEL
Albumin: 2.9 g/dL — ABNORMAL LOW (ref 3.5–5.0)
Anion gap: 12 (ref 5–15)
BUN: 44 mg/dL — ABNORMAL HIGH (ref 8–23)
CO2: 26 mmol/L (ref 22–32)
Calcium: 8.5 mg/dL — ABNORMAL LOW (ref 8.9–10.3)
Chloride: 95 mmol/L — ABNORMAL LOW (ref 98–111)
Creatinine, Ser: 2.78 mg/dL — ABNORMAL HIGH (ref 0.44–1.00)
GFR, Estimated: 19 mL/min — ABNORMAL LOW (ref >60)
Glucose, Bld: 110 mg/dL — ABNORMAL HIGH (ref 70–99)
Phosphorus: 4.6 mg/dL (ref 2.5–4.6)
Potassium: 3.9 mmol/L (ref 3.5–5.1)
Sodium: 133 mmol/L — ABNORMAL LOW (ref 135–145)

## 2022-03-21 LAB — GLUCOSE, CAPILLARY: Glucose-Capillary: 124 mg/dL — ABNORMAL HIGH (ref 70–99)

## 2022-03-21 LAB — ANCA TITERS
Atypical P-ANCA titer: <1:20 {titer}
C-ANCA: <1:20 {titer}
P-ANCA: <1:20 {titer}

## 2022-03-21 LAB — GLOMERULAR BASEMENT MEMBRANE ANTIBODIES: GBM Ab: <0.2 units (ref 0.0–0.9)

## 2022-03-21 LAB — KAPPA/LAMBDA LIGHT CHAINS
Kappa free light chain: 121.6 mg/L — ABNORMAL HIGH (ref 3.3–19.4)
Kappa, lambda light chain ratio: 2.71 — ABNORMAL HIGH (ref 0.26–1.65)
Lambda free light chains: 44.9 mg/L — ABNORMAL HIGH (ref 5.7–26.3)

## 2022-03-21 MED ORDER — ORAL CARE MOUTH RINSE: 15.0000 mL | OROMUCOSAL | Status: DC | PRN

## 2022-03-21 NOTE — Plan of Care (Signed)

## 2022-03-21 NOTE — Progress Notes (Signed)
[facility] Progress Note    Home meds: atrovastatin, estradiol, methotrexate '10mg'$  q wed, MVI, olmesartan 40 qd, buproprion, chlorthalidone 25 qd, furosemide 40 qd      Renal US - 10.6/ 9.6 cm kidneys w/o hydro, normal bladder      Jan 2017 - ANA +speckled, titer >= 1:1280       Jan 2017 - RF = +46 (15-41 = low, >= 42 is high)       HIV neg, hep B/C negative, UP/C ratio = 0.22         Assessment/ Plan Acute kidney injury - suspect hemodynamic in setting of anasarca, decreased EABV, renal hypoperfusion/ NSAID use + ARB effect.  Other potential dx's are autoimmune, paraproteinemia.  Albumin dropped to mid 3s in last 2 mos but no sig proteinuria, so doesn't look like neph syndrome. Have ordered complements, myeloma labs and some serologies, all pending. Cont to hold ARB and nsaids. Started IV lasix 80 bid and did not respond, but is now doing better w/ ^120 mg IV tid + zaroxolyn daily. C3C4 NL, HBV, HCV, HIV all neg. WBC's in urine but 0-5 RBC's w/ rare bact. UPC only 0.22. ANCA, ANA, GBM, K/L, SPEP all pending.   - Edema/ pain starting to improve and creat improving and holding off on kidney biopsy for now given inactive urine, minimal proteinuria. Cont diuresis given still significant pos sodium balance. Ted hose to help mobilize fluid.  - Avoid all nsaids in the future.   Anasarca: Likely associated with acute kidney injury and sodium retention effect of heavy NSAIDs with ongoing ARB use.  Starting to diurese as above.  Epigastric pain: possible gastritis associated with NSAID use.  Medical management per primary service with H2 RA +/- Carafate (the latter for maximum 72 hours use in setting of acute kidney injury to limit aluminum toxicity). Hypertension: no ARB/ acei for this pt w/ AKI. BP's soft, not on any BP lowering meds.  Rheumatoid arthritis - Dr Posey Pronto contacted her rheumatologist (Dr Amil Amen) who said she did not have SLE and said that he was treating her with methotrexate  for seronegative RA. She had been on meloxicam at some point in the past.    Subjective: 1500 cc UOP yesterday, 700 so far today. Creat 2.78 today. Pt starting to feel better today, legs are not as swollen and not as painful. Looks better.   Vitals:   03/20/22 2144 03/21/22 0500 03/21/22 0551 03/21/22 1414  BP: 96/66  (!) 91/55 119/69  Pulse: 88  85 86  Resp: 18   17  Temp: 98.4 F (36.9 C)  98 F (36.7 C) 98.5 F (36.9 C)  TempSrc: Oral  Oral Oral  SpO2: 94%  95% 93%  Weight:  60.3 kg    Height:        Exam: Gen no distress, looks better today No JVD Chest  no rales or wheezing Cor reg no RG ABd soft, distended, mild tenderness in epigastric area Ext 1-2 + bilat LE and UE edema, improving Neuro Ox 3, nonfocal    Recent Labs  Lab 03/18/22 0528 03/19/22 0750 03/20/22 0556 03/21/22 0502  HGB 11.7*  --  11.6*  --   ALBUMIN 3.4*   < > 3.2* 2.9*  CALCIUM 9.2   < > 8.9 8.5*  PHOS 5.8*   < > 5.0* 4.6  CREATININE 3.93*   < > 2.79* 2.78*  K 3.9   < > 3.3* 3.9   < > =  values in this interval not displayed.   No results for input(s): "IRON", "TIBC", "FERRITIN" in the last 168 hours. Inpatient medications:  atorvastatin  40 mg Oral Daily   diclofenac Sodium  2 g Topical QID   folic acid  1 mg Oral Daily   heparin  5,000 Units Subcutaneous Q8H   metolazone  5 mg Oral Daily   multivitamin with minerals  1 tablet Oral Daily   pantoprazole  40 mg Oral Daily    furosemide 120 mg (03/21/22 0558)   acetaminophen **OR** acetaminophen, fentaNYL (SUBLIMAZE) injection, hydrALAZINE, ondansetron **OR** ondansetron (ZOFRAN) IV, oxyCODONE

## 2022-03-21 NOTE — Progress Notes (Signed)
Physical Therapy Treatment Patient Details Name: Alyssa Rhodes MRN: [id number] DOB: 1961/03/28 Today's Date: 03/21/2022   History of Present Illness Pt is a 61 yo female admitted with swelling of BLEs and LUE and abdomen for last month.  Pt found to have AKI and pleural and pericardial effusions. PMH: HTN, DM, RA, lupus.    PT Comments    General Comments: AxO x 3 very 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  Stated her pain started "months ago" and has been back and forth between MD's.  Pt lives with her Son. General bed mobility comments: self able with increased, increased time.  Has a heating pad on her ABD and B LE elevated on pillow.  NO TEDS on as ordered.  Mild c/o dizziness EOB but decreased with time. General transfer comment: verbal cues for hand placement, assist for weakness and due to B ankle/feet pain, unsteady. General Gait Details: VERY limited amb distance of 14 feet with excessive lean on walker due to 10/10 B ankle/feet pain which pt describes as "tight", "sharpe" "throbbing" "feels like my skin is stretching".  VERY SLOW gait.  Nearly 12 min to complete 14 feet feet.  Unsteady esp with turns. Assisted back to bed and reapplied K Pad to ABD and elevated B LE. Pt plans to return home but currently is not able to amb safely on her own.   Recommendations for follow up therapy are one component of a multi-disciplinary discharge planning process, led by the attending physician.  Recommendations may be updated based on patient status, additional functional criteria and insurance authorization.  Follow Up Recommendations  Home health PT     Assistance Recommended at Discharge Set up Supervision/Assistance  Patient can return home with the following A little help with walking and/or transfers;Help with stairs or ramp for entrance   Equipment Recommendations  Rolling walker (2 wheels)    Recommendations for Other Services       Precautions / Restrictions  Precautions Precautions: Fall Restrictions Weight Bearing Restrictions: No     Mobility  Bed Mobility Overal bed mobility: Needs Assistance Bed Mobility: Supine to Sit, Sit to Supine     Supine to sit: Supervision, Min guard Sit to supine: Min guard, Min assist   General bed mobility comments: self able with increased, increased time.  Has a heating pad on her ABD and B LE elevated on pillow.  NO TEDS on as ordered.  Mild c/o dizziness EOB but decreased with time.    Transfers Overall transfer level: Needs assistance Equipment used: Rolling walker (2 wheels) Transfers: Sit to/from Stand Sit to Stand: Min assist           General transfer comment: verbal cues for hand placement, assist for weakness and due to B ankle/feet pain, unsteady.    Ambulation/Gait Ambulation/Gait assistance: Min assist Gait Distance (Feet): 14 Feet Assistive device: Rolling walker (2 wheels) Gait Pattern/deviations: Step-to pattern, Decreased step length - left, Decreased step length - right, Trunk flexed, Narrow base of support Gait velocity: decreased     General Gait Details: VERY limited amb distance of 14 feet with excessive lean on walker due to 10/10 B ankle/feet pain which pt describes as "tight", "sharpe" "throbbing" "feels like my skin is stretching".  VERY SLOW gait.  Nearly 12 min to complete 14 feet feet.  Unsteady esp with turns.   Stairs             Wheelchair Mobility    Modified Rankin (Stroke  Patients Only)       Balance                                            Cognition Arousal/Alertness: Awake/alert Behavior During Therapy: WFL for tasks assessed/performed Overall Cognitive Status: Within Functional Limits for tasks assessed                                 General Comments: AxO x 3 very 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  Stated her pain started "months ago" and has been back and forth between MD's.  Pt lives  with her Son.        Exercises      General Comments        Pertinent Vitals/Pain Pain Assessment Pain Assessment: 0-10 Pain Score: 10-Worst pain ever Pain Location: B feet/ankles 10/10 and ABD 7/10 Pain Descriptors / Indicators: Tightness, Grimacing Pain Intervention(s): Monitored during session, Premedicated before session, Repositioned    Home Living                          Prior Function            PT Goals (current goals can now be found in the care plan section) Progress towards PT goals: Progressing toward goals    Frequency    Min 3X/week      PT Plan Current plan remains appropriate    Co-evaluation              AM-PAC PT "6 Clicks" Mobility   Outcome Measure  Help needed turning from your back to your side while in a flat bed without using bedrails?: A Little Help needed moving from lying on your back to sitting on the side of a flat bed without using bedrails?: A Little Help needed moving to and from a bed to a chair (including a wheelchair)?: A Little Help needed standing up from a chair using your arms (e.g., wheelchair or bedside chair)?: A Little Help needed to walk in hospital room?: A Lot Help needed climbing 3-5 steps with a railing? : Total 6 Click Score: 15    End of Session Equipment Utilized During Treatment: Gait belt Activity Tolerance: Patient limited by pain Patient left: with call bell/phone within reach;in bed;with bed alarm set Nurse Communication: Mobility status PT Visit Diagnosis: Other abnormalities of gait and mobility (R26.89)     Time: 1610-9604 PT Time Calculation (min) (ACUTE ONLY): 25 min  Charges:  $Gait Training: 8-22 mins $Therapeutic Activity: 8-22 mins                     Rica Koyanagi  PTA [address] [place] Office M-F          [phone] Weekend pager [phone]

## 2022-03-21 NOTE — Progress Notes (Signed)
PROGRESS NOTE    VALLEY KE  YQM:[id number] DOB: 1961/07/24 DOA: 03/17/2022 PCP: Doretha Sou, MD    Brief Narrative:  Alyssa Rhodes is a 61 y.o. female with medical history significant for but not limited to hypertension, uncontrolled diabetes mellitus type 2 with recent hemoglobin A1c of 13.5, dyslipidemia, history of rheumatoid arthritis, history of lupus as well as other comorbidities who presented with swelling of her left upper extremity and bilateral lower extremities and abdomen with epigastric abdominal discomfort and pain that has been going on for several months now.  Patient has a normal baseline creatinine of 0.7-0.9 and states that she has had worsening swelling and pain of her extremities as well as abdomen.  States that she vomited twice and has not really been able to eat anything given her gastric distention and epigastric pain.  She has been taking ibuprofen at least 2-3 times a day due to her pain and has been having some abdominal discomfort and diarrhea with metformin which she stopped because of this and subsequently states that her blood sugars elevated.  Given her lower extremity swelling she was seen by cardiology in outpatient setting who did an echocardiogram which showed a preserved ejection fracture with mild pericardial effusion.  She was initiated on furosemide but did not take this on a consistent basis.  She has been having worsening lower extremity edema and has been following Dr. Amil Amen for rheumatology care and had another medication that she was taken for her lupus.  She notes that she has had worsening abdominal discomfort and distention as well as lower extremity swelling and edema with no appreciable rashes.  She denies any other concerns or complaints at this time.  Given her persistence in her symptoms and lower extremity pain and abdominal pain she presented to the ED for further evaluation.  [facility] hospitalist was consulted for further evaluation and  will admit this patient for AKI with bilateral lower extremity edema and anasarca.    Assessment and Plan:  AKI -Likely in the setting of worsening NSAIDs along with her nephrotoxic medications including Benicar and furosemide as well as chlorthalidone -Baseline creatinine ranges from 0.7-0.9 and she presented with a BUN/creatinine of 37/4.05- -lasix held recently at [facility] due to a Cr of 2 -Renal ultrasound done and showed "Decreased renal volume bilaterally.  No obstruction or mass.  Right pleural effusion" -Nephrology following and consult appreciated- may need renal biopsy -strict I's and O's - down 2.2L -duplex LE negative -defer to renal   Anasarca with pleural effusions and slight pericardial effusion -Likely in the setting of AKI and NSAID and sodium retention effect with ongoing ARB -Nephrology is on attempt diuresis with furosemide will need to continue monitor - heart failure has been essentially ruled out given her echocardiogram and a BNP of 25.6 -CT Scan Chest/Abd/Pelvis done and showed "Small-to-moderate bilateral pleural effusions, more so on the right side. Small pericardial effusion. There are small linear patchy densities in right upper lung  field and both lower lung fields suggesting  atelectasis. There is no evidence of intestinal  obstruction or pneumoperitoneum. There is no hydronephrosis. Small ascites. There is diffuse edema in abdominal wall suggesting anasarca.   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 Scattered calcifications are seen in aorta and major branches.   Adrenal adenoma -As above likely incidental finding -Follow-up in outpatient setting   Hyponatremia -resolved    Hypokalemia -replete  Epigastric pain -Could be related to gastritis due to  NSAID use versus ulcerations -Placing on PPI and Carafate -hgb stable   Diabetes mellitus type 2 -recent Hgb A1c was 13.5 -repeat hemoglobin A1c here: 7.5 -Continue monitor CBGs  per protocol -SSI   Thrombocytosis -resolved- likely reactive   Lupus -Carries a diagnosis of this and per the patient was recently taken off the medication and question what this was -per rheum does not have   Possible rheumatoid arthritis - follows with [facility] -She has been on methotrexate 10 mg every Wednesday which we will be held   DVT prophylaxis: heparin injection 5,000 Units Start: 03/17/22 2200 SCDs Start: 03/17/22 1800    Code Status: Full Code Family Communication:   Disposition Plan:  Level of care: Telemetry Status is: inpt- plan per urology    Consultants:  renal   Subjective: No pain  Objective: Vitals:   03/20/22 1441 03/20/22 2144 03/21/22 0500 03/21/22 0551  BP: 117/66 96/66  (!) 91/55  Pulse: 99 88  85  Resp: 18 18    Temp: 98.4 F (36.9 C) 98.4 F (36.9 C)  98 F (36.7 C)  TempSrc: Oral Oral  Oral  SpO2: 98% 94%  95%  Weight:   60.3 kg   Height:        Intake/Output Summary (Last 24 hours) at 03/21/2022 1210 Last data filed at 03/21/2022 0553 Gross per 24 hour  Intake 186 ml  Output 700 ml  Net -514 ml   Filed Weights   03/19/22 0500 03/20/22 0500 03/21/22 0500  Weight: 63 kg 66.7 kg 60.3 kg    Examination:    General: Appearance:    Well developed, well nourished female in no acute distress     Lungs:      respirations unlabored  Heart:    Normal heart rate.  MS:   All extremities are intact.   Neurologic:   Awake, alert         Data Reviewed: I have personally reviewed following labs and imaging studies  CBC: Recent Labs  Lab 03/17/22 1000 03/18/22 0528 03/20/22 0556  WBC 7.7 5.5 4.4  NEUTROABS 6.5 4.0  --   HGB 12.9 11.7* 11.6*  HCT 37.0 34.4* 33.5*  MCV 91.4 95.3 94.1  PLT 437* 363 267   Basic Metabolic Panel: Recent Labs  Lab 03/17/22 1000 03/18/22 0528 03/19/22 0750 03/20/22 0556 03/21/22 0502  NA 132* 136 137 135 133*  K 4.3 3.9 3.9 3.3* 3.9  CL 93* 99 98 95* 95*  CO2 '22 24 30 28  26  '$ GLUCOSE 126* 108* 129* 116* 110*  BUN 37* 41* 47* 41* 44*  CREATININE 4.05* 3.93* 3.68* 2.79* 2.78*  CALCIUM 9.4 9.2 8.7* 8.9 8.5*  MG  --  2.0  --   --   --   PHOS  --  5.8* 5.7* 5.0* 4.6   GFR: Estimated Creatinine Clearance: 18.2 mL/min (A) (by C-G formula based on SCr of 2.78 mg/dL (H)). Liver Function Tests: Recent Labs  Lab 03/17/22 1000 03/18/22 0528 03/19/22 0750 03/20/22 0556 03/21/22 0502  AST 24 21  --   --   --   ALT 17 18  --   --   --   ALKPHOS 52 45  --   --   --   BILITOT 0.8 0.8  --   --   --   PROT 6.9 6.0*  --   --   --   ALBUMIN 3.6 3.4* 3.0* 3.2* 2.9*   Recent Labs  Lab  03/17/22 1000  LIPASE 28   No results for input(s): "AMMONIA" in the last 168 hours. Coagulation Profile: No results for input(s): "INR", "PROTIME" in the last 168 hours. Cardiac Enzymes: No results for input(s): "CKTOTAL", "CKMB", "CKMBINDEX", "TROPONINI" in the last 168 hours. BNP (last 3 results) No results for input(s): "PROBNP" in the last 8760 hours. HbA1C: No results for input(s): "HGBA1C" in the last 72 hours.  CBG: Recent Labs  Lab 03/17/22 1714 03/17/22 1719 03/18/22 0822 03/19/22 0736 03/21/22 0833  GLUCAP <10* 73 107* 144* 124*   Lipid Profile: No results for input(s): "CHOL", "HDL", "LDLCALC", "TRIG", "CHOLHDL", "LDLDIRECT" in the last 72 hours. Thyroid Function Tests: No results for input(s): "TSH", "T4TOTAL", "FREET4", "T3FREE", "THYROIDAB" in the last 72 hours.  Anemia Panel: No results for input(s): "VITAMINB12", "FOLATE", "FERRITIN", "TIBC", "IRON", "RETICCTPCT" in the last 72 hours. Sepsis Labs: No results for input(s): "PROCALCITON", "LATICACIDVEN" in the last 168 hours.  No results found for this or any previous visit (from the past 240 hour(s)).       Radiology Studies: No results found.      Scheduled Meds:  atorvastatin  40 mg Oral Daily   diclofenac Sodium  2 g Topical QID   folic acid  1 mg Oral Daily   heparin  5,000 Units  Subcutaneous Q8H   metolazone  5 mg Oral Daily   multivitamin with minerals  1 tablet Oral Daily   pantoprazole  40 mg Oral Daily   Continuous Infusions:  furosemide 120 mg (03/21/22 0558)     LOS: 3 days    Time spent: 45 minutes spent on chart review, discussion with nursing staff, consultants, updating family and interview/physical exam; more than 50% of that time was spent in counseling and/or coordination of care.    Geradine Girt, DO [facility] Available via Epic secure chat 7am-7pm After these hours, please refer to coverage provider listed on [email or web address] 03/21/2022, 12:10 PM

## 2022-03-22 ENCOUNTER — Inpatient Hospital Stay (HOSPITAL_COMMUNITY): Payer: BC Managed Care – PPO

## 2022-03-22 DIAGNOSIS — M7989 Other specified soft tissue disorders: Secondary | ICD-10-CM

## 2022-03-22 DIAGNOSIS — N179 Acute kidney failure, unspecified: Secondary | ICD-10-CM | POA: Diagnosis not present

## 2022-03-22 LAB — BASIC METABOLIC PANEL
Anion gap: 13 (ref 5–15)
BUN: 49 mg/dL — ABNORMAL HIGH (ref 8–23)
CO2: 28 mmol/L (ref 22–32)
Calcium: 8.6 mg/dL — ABNORMAL LOW (ref 8.9–10.3)
Chloride: 92 mmol/L — ABNORMAL LOW (ref 98–111)
Creatinine, Ser: 2.77 mg/dL — ABNORMAL HIGH (ref 0.44–1.00)
GFR, Estimated: 19 mL/min — ABNORMAL LOW (ref >60)
Glucose, Bld: 110 mg/dL — ABNORMAL HIGH (ref 70–99)
Potassium: 3.7 mmol/L (ref 3.5–5.1)
Sodium: 133 mmol/L — ABNORMAL LOW (ref 135–145)

## 2022-03-22 LAB — PROTEIN ELECTROPHORESIS, SERUM
A/G Ratio: 1.1 (ref 0.7–1.7)
Albumin ELP: 3.1 g/dL (ref 2.9–4.4)
Alpha-1-Globulin: 0.3 g/dL (ref 0.0–0.4)
Alpha-2-Globulin: 0.8 g/dL (ref 0.4–1.0)
Beta Globulin: 0.8 g/dL (ref 0.7–1.3)
Gamma Globulin: 0.9 g/dL (ref 0.4–1.8)
Globulin, Total: 2.8 g/dL (ref 2.2–3.9)
Total Protein ELP: 5.9 g/dL — ABNORMAL LOW (ref 6.0–8.5)

## 2022-03-22 LAB — FANA STAINING PATTERNS: Speckled Pattern: 24529 — ABNORMAL HIGH

## 2022-03-22 LAB — GLUCOSE, CAPILLARY: Glucose-Capillary: 86 mg/dL (ref 70–99)

## 2022-03-22 LAB — ANTINUCLEAR ANTIBODIES, IFA: ANA Ab, IFA: POSITIVE — AB

## 2022-03-22 NOTE — Progress Notes (Signed)
PROGRESS NOTE    [name]  JOI:[id number] DOB: 03/25/1961 DOA: 03/17/2022 PCP: Doretha Sou, MD    Brief Narrative:  Alyssa Rhodes is a 61 y.o. female with medical history significant for but not limited to hypertension, uncontrolled diabetes mellitus type 2 with recent hemoglobin A1c of 13.5, dyslipidemia, history of rheumatoid arthritis, history of lupus as well as other comorbidities who presented with swelling of her left upper extremity and bilateral lower extremities and abdomen with epigastric abdominal discomfort and pain that has been going on for several months now.  Patient has a normal baseline creatinine of 0.7-0.9 and states that she has had worsening swelling and pain of her extremities as well as abdomen.  States that she vomited twice and has not really been able to eat anything given her gastric distention and epigastric pain.  She has been taking ibuprofen at least 2-3 times a day due to her pain and has been having some abdominal discomfort and diarrhea with metformin which she stopped because of this and subsequently states that her blood sugars elevated.  Given her lower extremity swelling she was seen by cardiology in outpatient setting who did an echocardiogram which showed a preserved ejection fracture with mild pericardial effusion.  She was initiated on furosemide but did not take this on a consistent basis.  She has been having worsening lower extremity edema and has been following Dr. Amil Amen for rheumatology care and had another medication that she was taken for her lupus.  She notes that she has had worsening abdominal discomfort and distention as well as lower extremity swelling and edema with no appreciable rashes.  She denies any other concerns or complaints at this time.  Given her persistence in her symptoms and lower extremity pain and abdominal pain she presented to the ED for further evaluation.  [facility] hospitalist was consulted for further evaluation and  will admit this patient for AKI with bilateral lower extremity edema and anasarca. Responded to IV lasix.  Plan per nephrology.   Assessment and Plan:  AKI -Likely in the setting of worsening NSAIDs along with her nephrotoxic medications including Benicar and furosemide as well as chlorthalidone -Baseline creatinine ranges from 0.7-0.9 and she presented with a BUN/creatinine of 37/4.05- -lasix held recently at [facility] due to a Cr of 2 -Renal ultrasound done and showed "Decreased renal volume bilaterally.  No obstruction or mass.  Right pleural effusion" -Nephrology following and consult appreciated- may need renal biopsy -strict I's and O's - down 2.9L -duplex LE negative -defer to renal plan   Anasarca with pleural effusions and slight pericardial effusion -Likely in the setting of AKI and NSAID and sodium retention effect with ongoing ARB -Nephrology is on attempt diuresis with furosemide will need to continue monitor - heart failure has been essentially ruled out given her echocardiogram and a BNP of 25.6 -CT Scan Chest/Abd/Pelvis done and showed "Small-to-moderate bilateral pleural effusions, more so on the right side. Small pericardial effusion. There are small linear patchy densities in right upper lung  field and both lower lung fields suggesting  atelectasis. There is no evidence of intestinal  obstruction or pneumoperitoneum. There is no hydronephrosis. Small ascites. There is diffuse edema in abdominal wall suggesting anasarca.   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 Scattered calcifications are seen in aorta and major branches.   Adrenal adenoma -As above likely incidental finding -Follow-up in outpatient setting   Hyponatremia -mild   Hypokalemia -replete  Epigastric pain -  Could be related to gastritis due to NSAID use versus ulcerations -Placing on PPI and Carafate -hgb stable   Diabetes mellitus type 2 -recent Hgb A1c was 13.5 -repeat  hemoglobin A1c here: 7.5 -Continue monitor CBGs per protocol -SSI   Thrombocytosis -resolved- likely reactive   Lupus -Carries a diagnosis of this and per the patient was recently taken off the medication and question what this was -per rheum does not have   Possible rheumatoid arthritis - follows with [facility] -She has been on methotrexate 10 mg every Wednesday which we will be held   DVT prophylaxis: Place TED hose Start: 03/21/22 1508 heparin injection 5,000 Units Start: 03/17/22 2200 SCDs Start: 03/17/22 1800    Code Status: Full Code Family Communication:   Disposition Plan:  Level of care: Telemetry Status is: inpt- plan per nephrology  Consultants:  renal   Subjective: Some mild leg pain  Objective: Vitals:   03/21/22 0551 03/21/22 1414 03/21/22 2204 03/22/22 0508  BP: (!) 91/55 119/69 (!) 85/59 (!) 82/57  Pulse: 85 86 93 82  Resp:  '17 18 16  '$ Temp: 98 F (36.7 C) 98.5 F (36.9 C) 98 F (36.7 C) 97.7 F (36.5 C)  TempSrc: Oral Oral    SpO2: 95% 93% 95% 99%  Weight:      Height:        Intake/Output Summary (Last 24 hours) at 03/22/2022 1255 Last data filed at 03/22/2022 0900 Gross per 24 hour  Intake 654 ml  Output 1000 ml  Net -346 ml   Filed Weights   03/19/22 0500 03/20/22 0500 03/21/22 0500  Weight: 63 kg 66.7 kg 60.3 kg    Examination:    General: Appearance:    Well developed, well nourished female in no acute distress     Lungs:      respirations unlabored  Heart:    Normal heart rate.   MS:   All extremities are intact.   Neurologic:   Awake, alert           Data Reviewed: I have personally reviewed following labs and imaging studies  CBC: Recent Labs  Lab 03/17/22 1000 03/18/22 0528 03/20/22 0556  WBC 7.7 5.5 4.4  NEUTROABS 6.5 4.0  --   HGB 12.9 11.7* 11.6*  HCT 37.0 34.4* 33.5*  MCV 91.4 95.3 94.1  PLT 437* 363 527   Basic Metabolic Panel: Recent Labs  Lab 03/18/22 0528 03/19/22 0750  03/20/22 0556 03/21/22 0502 03/22/22 0625  NA 136 137 135 133* 133*  K 3.9 3.9 3.3* 3.9 3.7  CL 99 98 95* 95* 92*  CO2 '24 30 28 26 28  '$ GLUCOSE 108* 129* 116* 110* 110*  BUN 41* 47* 41* 44* 49*  CREATININE 3.93* 3.68* 2.79* 2.78* 2.77*  CALCIUM 9.2 8.7* 8.9 8.5* 8.6*  MG 2.0  --   --   --   --   PHOS 5.8* 5.7* 5.0* 4.6  --    GFR: Estimated Creatinine Clearance: 18.2 mL/min (A) (by C-G formula based on SCr of 2.77 mg/dL (H)). Liver Function Tests: Recent Labs  Lab 03/17/22 1000 03/18/22 0528 03/19/22 0750 03/20/22 0556 03/21/22 0502  AST 24 21  --   --   --   ALT 17 18  --   --   --   ALKPHOS 52 45  --   --   --   BILITOT 0.8 0.8  --   --   --   PROT 6.9 6.0*  --   --   --  ALBUMIN 3.6 3.4* 3.0* 3.2* 2.9*   Recent Labs  Lab 03/17/22 1000  LIPASE 28   No results for input(s): "AMMONIA" in the last 168 hours. Coagulation Profile: No results for input(s): "INR", "PROTIME" in the last 168 hours. Cardiac Enzymes: No results for input(s): "CKTOTAL", "CKMB", "CKMBINDEX", "TROPONINI" in the last 168 hours. BNP (last 3 results) No results for input(s): "PROBNP" in the last 8760 hours. HbA1C: No results for input(s): "HGBA1C" in the last 72 hours.  CBG: Recent Labs  Lab 03/17/22 1719 03/18/22 0822 03/19/22 0736 03/21/22 0833 03/22/22 0826  GLUCAP 73 107* 144* 124* 86   Lipid Profile: No results for input(s): "CHOL", "HDL", "LDLCALC", "TRIG", "CHOLHDL", "LDLDIRECT" in the last 72 hours. Thyroid Function Tests: No results for input(s): "TSH", "T4TOTAL", "FREET4", "T3FREE", "THYROIDAB" in the last 72 hours.  Anemia Panel: No results for input(s): "VITAMINB12", "FOLATE", "FERRITIN", "TIBC", "IRON", "RETICCTPCT" in the last 72 hours. Sepsis Labs: No results for input(s): "PROCALCITON", "LATICACIDVEN" in the last 168 hours.  No results found for this or any previous visit (from the past 240 hour(s)).       Radiology Studies: No results  found.      Scheduled Meds:  atorvastatin  40 mg Oral Daily   diclofenac Sodium  2 g Topical QID   folic acid  1 mg Oral Daily   heparin  5,000 Units Subcutaneous Q8H   metolazone  5 mg Oral Daily   multivitamin with minerals  1 tablet Oral Daily   pantoprazole  40 mg Oral Daily   Continuous Infusions:  furosemide 120 mg (03/22/22 0521)     LOS: 4 days    Time spent: 45 minutes spent on chart review, discussion with nursing staff, consultants, updating family and interview/physical exam; more than 50% of that time was spent in counseling and/or coordination of care.    Geradine Girt, DO [facility] Available via Epic secure chat 7am-7pm After these hours, please refer to coverage provider listed on [email or web address] 03/22/2022, 12:55 PM

## 2022-03-22 NOTE — Progress Notes (Signed)
[facility] Progress Note    Home meds: atrovastatin, estradiol, methotrexate '10mg'$  q wed, MVI, olmesartan 40 qd, buproprion, chlorthalidone 25 qd, furosemide 40 qd      Renal US - 10.6/ 9.6 cm kidneys w/o hydro, normal bladder      Jan 2017 - ANA +speckled, titer >= 1:1280       Jan 2017 - RF = +46 (15-41 = low, >= 42 is high)       HIV neg, hep B/C negative, UP/C ratio = 0.22         Assessment/ Plan Acute kidney injury - suspect hemodynamic in setting of anasarca, decreased EABV, renal hypoperfusion/ NSAID use + ARB effect.  Other potential dx's are autoimmune, paraproteinemia.  Albumin dropped to mid 3s in last 2 mos but no sig proteinuria, so doesn't look like neph syndrome. Have ordered complements, myeloma labs and some serologies, all pending. Cont to hold ARB and nsaids. Started IV lasix 80 bid and did not respond, but is now doing better w/ ^120 mg IV tid + zaroxolyn daily. C3C4 NL, HBV, HCV, HIV, ANCA, GBM all neg. K/L elevated which could be seen w/ renal failure. [place] WBC's in urine but 0-5 RBC's w/ rare bact. UPC only 0.22.  ANA pending.  - Edema/ pain starting to improve and creat improving and holding off on kidney biopsy for now given inactive urine, minimal proteinuria. Cont diuresis given still significant pos sodium balance. Ted hose to help mobilize fluid (had on last night)  - Avoid all nsaids in the future. May have AIN; I d/w the patient and she agrees to do a renal biopsy as steroids may help shorten the course.  Anasarca: Likely associated with acute kidney injury and sodium retention effect of heavy NSAIDs with ongoing ARB use.  Starting to diurese as above. - LUE duplex r/o DVT (LUE >>>RUE which is new for her)  Epigastric pain: possible gastritis associated with NSAID use.  Medical management per primary service with H2 RA +/- Carafate (the latter for maximum 72 hours use in setting of acute kidney injury to limit aluminum toxicity). Hypertension: no  ARB/ acei for this pt w/ AKI. BP's soft, not on any BP lowering meds.  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 She had been on meloxicam at some point in the past.    Subjective: 700 cc UOP yesterday, hypotensive. Creat 2.77 today. Pt waxes and wanes, not feeling well today, legs are not as swollen and not as painful.   Vitals:   03/21/22 0551 03/21/22 1414 03/21/22 2204 03/22/22 0508  BP: (!) 91/55 119/69 (!) 85/59 (!) 82/57  Pulse: 85 86 93 82  Resp:  '17 18 16  '$ Temp: 98 F (36.7 C) 98.5 F (36.9 C) 98 F (36.7 C) 97.7 F (36.5 C)  TempSrc: Oral Oral    SpO2: 95% 93% 95% 99%  Weight:      Height:        Exam: Gen no distress, looks uncomfortable today No JVD Chest  no rales or wheezing Cor reg no RG ABd soft, distended, mild tenderness in epigastric area Ext 1+ bilat LE edema, improving, LUE >>>RUE Neuro Ox 3, nonfocal    Recent Labs  Lab 03/18/22 0528 03/19/22 0750 03/20/22 0556 03/21/22 0502 03/22/22 0625  HGB 11.7*  --  11.6*  --   --  ALBUMIN 3.4*   < > 3.2* 2.9*  --   CALCIUM 9.2   < > 8.9 8.5* 8.6*  PHOS 5.8*   < > 5.0* 4.6  --   CREATININE 3.93*   < > 2.79* 2.78* 2.77*  K 3.9   < > 3.3* 3.9 3.7   < > = values in this interval not displayed.   No results for input(s): "IRON", "TIBC", "FERRITIN" in the last 168 hours. Inpatient medications:  atorvastatin  40 mg Oral Daily   diclofenac Sodium  2 g Topical QID   folic acid  1 mg Oral Daily   heparin  5,000 Units Subcutaneous Q8H   metolazone  5 mg Oral Daily   multivitamin with minerals  1 tablet Oral Daily   pantoprazole  40 mg Oral Daily    furosemide 120 mg (03/22/22 0521)   acetaminophen **OR** acetaminophen, fentaNYL (SUBLIMAZE) injection, hydrALAZINE, ondansetron **OR** ondansetron (ZOFRAN) IV, mouth rinse, oxyCODONE

## 2022-03-22 NOTE — Plan of Care (Signed)
  Problem: Education: Goal: Knowledge of General Education information will improve Description: Including pain rating scale, medication(s)/side effects and non-pharmacologic comfort measures 03/22/2022 0515 by Mliss Sax, RN Outcome: Progressing 03/22/2022 0514 by Mliss Sax, RN Outcome: Progressing   Problem: Health Behavior/Discharge Planning: Goal: Ability to manage health-related needs will improve 03/22/2022 0515 by Mliss Sax, RN Outcome: Progressing 03/22/2022 0514 by Mliss Sax, RN Outcome: Progressing   Problem: Clinical Measurements: Goal: Ability to maintain clinical measurements within normal limits will improve 03/22/2022 0515 by Mliss Sax, RN Outcome: Progressing 03/22/2022 0514 by Mliss Sax, RN Outcome: Progressing Goal: Will remain free from infection 03/22/2022 0515 by Mliss Sax, RN Outcome: Progressing 03/22/2022 0514 by Mliss Sax, RN Outcome: Progressing Goal: Diagnostic test results will improve 03/22/2022 0515 by Mliss Sax, RN Outcome: Progressing 03/22/2022 0514 by Mliss Sax, RN Outcome: Progressing Goal: Respiratory complications will improve 03/22/2022 0515 by Mliss Sax, RN Outcome: Progressing 03/22/2022 0514 by Mliss Sax, RN Outcome: Progressing Goal: Cardiovascular complication will be avoided 03/22/2022 0515 by Mliss Sax, RN Outcome: Progressing 03/22/2022 0514 by Mliss Sax, RN Outcome: Progressing   Problem: Activity: Goal: Risk for activity intolerance will decrease 03/22/2022 0515 by Mliss Sax, RN Outcome: Progressing 03/22/2022 0514 by Mliss Sax, RN Outcome: Progressing   Problem: Nutrition: Goal: Adequate nutrition will be maintained 03/22/2022 0515 by Mliss Sax, RN Outcome: Progressing 03/22/2022 0514 by Mliss Sax, RN Outcome: Progressing   Problem: Coping: Goal: Level of anxiety will decrease 03/22/2022 0515 by Mliss Sax, RN Outcome: Progressing 03/22/2022 0514 by Mliss Sax,  RN Outcome: Progressing   Problem: Elimination: Goal: Will not experience complications related to bowel motility 03/22/2022 0515 by Mliss Sax, RN Outcome: Progressing 03/22/2022 0514 by Mliss Sax, RN Outcome: Progressing Goal: Will not experience complications related to urinary retention 03/22/2022 0515 by Mliss Sax, RN Outcome: Progressing 03/22/2022 0514 by Mliss Sax, RN Outcome: Progressing   Problem: Pain Managment: Goal: General experience of comfort will improve 03/22/2022 0515 by Mliss Sax, RN Outcome: Progressing 03/22/2022 0514 by Mliss Sax, RN Outcome: Progressing   Problem: Safety: Goal: Ability to remain free from injury will improve 03/22/2022 0515 by Mliss Sax, RN Outcome: Progressing 03/22/2022 0514 by Mliss Sax, RN Outcome: Progressing   Problem: Skin Integrity: Goal: Risk for impaired skin integrity will decrease 03/22/2022 0515 by Mliss Sax, RN Outcome: Progressing 03/22/2022 0514 by Mliss Sax, RN Outcome: Progressing

## 2022-03-22 NOTE — NC FL2 (Signed)
[place] LEVEL OF CARE SCREENING TOOL     IDENTIFICATION  Patient Name: Alyssa Rhodes Birthdate: 22-Jan-1961 Sex: female Admission Date (Current Location): 03/17/2022  [facility] and [place] Number:  Herbalist and Address:  [facility],  [place], [place]      Provider Number: [id number]  Attending Physician Name and Address:  Geradine Girt, DO  Relative Name and Phone Number:  Sakshi, Sermons [phone]    Current Level of Care: Hospital Recommended Level of Care: [place] Prior Approval Number:    Date Approved/Denied:   PASRR Number: [id number]  Discharge Plan: SNF    Current Diagnoses: Patient Active Problem List   Diagnosis Date Noted   AKI (acute kidney injury) ([place]) 03/17/2022   Iron deficiency anemia 12/18/2019   Microcytic anemia 12/17/2019   Lupus ([place])    Other fatigue    Deficiency anemia    HTN (hypertension) 10/18/2011   Tobacco user 10/18/2011    Orientation RESPIRATION BLADDER Height & Weight     Self, Time, Situation, Place  Normal Continent Weight: 132 lb 15 oz (60.3 kg) Height:  '5\' 2"'$  (157.5 cm)  BEHAVIORAL SYMPTOMS/MOOD NEUROLOGICAL BOWEL NUTRITION STATUS      Continent Diet (Regular)  AMBULATORY STATUS COMMUNICATION OF NEEDS Skin   Limited Assist Verbally Normal                       Personal Care Assistance Level of Assistance  Bathing, Feeding, Dressing Bathing Assistance: Limited assistance Feeding assistance: Independent Dressing Assistance: Limited assistance     Functional Limitations Info  Sight, Speech, Hearing Sight Info: Adequate Hearing Info: Adequate Speech Info: Adequate    SPECIAL CARE FACTORS FREQUENCY  PT (By licensed PT), OT (By licensed OT)     PT Frequency: 5x/wk OT Frequency: 5x/wk            Contractures Contractures Info: Not present    Additional Factors Info  Code Status, Allergies Code Status Info:  FULL Allergies Info: Codeine, Hydroxychloroquine           Current Medications (03/22/2022):  This is the current hospital active medication list Current Facility-Administered Medications  Medication Dose Route Frequency Provider Last Rate Last Admin   acetaminophen (TYLENOL) tablet 650 mg  650 mg Oral Q6H PRN Raiford Noble [place], DO   650 mg at 03/20/22 1630   Or   acetaminophen (TYLENOL) suppository 650 mg  650 mg Rectal Q6H PRN Raiford Noble [place], DO       atorvastatin (LIPITOR) tablet 40 mg  40 mg Oral Daily Raiford Noble [place], DO   40 mg at 03/22/22 0865   diclofenac Sodium (VOLTAREN) 1 % topical gel 2 g  2 g Topical QID Vann, Jessica U, DO   2 g at 03/22/22 7846   fentaNYL (SUBLIMAZE) injection 25 mcg  25 mcg Intravenous Q2H PRN Raiford Noble Latif, DO   25 mcg at 96/29/52 8413   folic acid (FOLVITE) tablet 1 mg  1 mg Oral Daily Raiford Noble [place], DO   1 mg at 03/22/22 2440   furosemide (LASIX) 120 mg in dextrose 5 % 50 mL IVPB  120 mg Intravenous Q8H Roney Jaffe, MD 62 mL/hr at 03/22/22 0521 120 mg at 03/22/22 0521   heparin injection 5,000 Units  5,000 Units Subcutaneous [address] [place], DO   5,000 Units at 03/22/22 1027   hydrALAZINE (APRESOLINE) injection 10 mg  10  mg Intravenous Q6H PRN Raiford Noble Latif, DO       metolazone (ZAROXOLYN) tablet 5 mg  5 mg Oral Daily Roney Jaffe, MD   5 mg at 03/21/22 0854   multivitamin with minerals tablet 1 tablet  1 tablet Oral Daily Raiford Noble [place], [place]   1 tablet at 03/22/22 0837   ondansetron [facility]) tablet 4 mg  4 mg Oral Q6H PRN Raiford Noble [place], DO   4 mg at 03/21/22 1758   Or   ondansetron [facility]) injection 4 mg  4 mg Intravenous Q6H PRN Raiford Noble Latif, DO   4 mg at 03/20/22 1457   Oral care mouth rinse  15 mL Mouth Rinse PRN Eulogio Bear U, DO       oxyCODONE (Oxy IR/ROXICODONE) immediate release tablet 5 mg  5 mg Oral Q4H PRN Raiford Noble Latif, DO   5 mg at 03/22/22 0850   pantoprazole (PROTONIX)  EC tablet 40 mg  40 mg Oral Daily Raiford Noble [place], DO   40 mg at 03/22/22 1601     Discharge Medications: Please see discharge summary for a list of discharge medications.  Relevant Imaging Results:  Relevant Lab Results:   Additional Information SSN: [id number]  Vassie Moselle, LCSW

## 2022-03-22 NOTE — TOC Progression Note (Signed)
Transition of Care [facility]) - Progression Note    Patient Details  Name: Alyssa Rhodes MRN: [id number] Date of Birth: [birth year]  Transition of Care [facility]) CM/SW [place], LCSW Phone Number: 03/22/2022, 1:32 PM  Clinical Narrative:    Pt currently being recommended for SNF. Pt is agreeable to this plan. Referral has been faxed out for SNF placement and currently awaiting bed offers.    Expected Discharge Plan: [place] Barriers to Discharge: Continued Medical Work up  Expected Discharge Plan and Services Expected Discharge Plan: [place] In-house Referral: NA Discharge Planning Services: NA Post Acute Care Choice: Durable Medical Equipment, Home Health Living arrangements for the past 2 months: Single Family Home                                       Social Determinants of Health (SDOH) Interventions    Readmission Risk Interventions     No data to display

## 2022-03-22 NOTE — Progress Notes (Signed)
Left upper extremity venous duplex has been completed. Preliminary results can be found in CV Proc through chart review.   03/22/22 3:13 PM Alyssa Rhodes RVT

## 2022-03-22 NOTE — Consult Note (Signed)
Chief Complaint: AKI. Request is for random renal biopsy  Referring Physician(s): Dr. Penne Lash  Supervising Physician: Michaelle Birks  Patient Status: [facility] - In-pt  History of Present Illness: Alyssa Rhodes is a 61 y.o. female History of HTN, DM, HLD, RA, Lupus. Presented to the ED at [facility] with swelling to her  left upper extremity and bilateral lower extremities and abdomen and abdominal pain X several months. Found to be in AKI. Team is requesting random renal biopsy for further evaluation of AKI.   Patient alert and laying in bed, calm. Endorses abdominal pain. Denies any fevers, headache, chest pain, SOB, cough, nausea, vomiting or bleeding. Return precautions and treatment recommendations and follow-up discussed with the patient  who is agreeable with the plan.   Past Medical History:  Diagnosis Date   Diabetes mellitus without complication ([place])    Hyperlipidemia    Hypertension    Lupus ([place])    Thyroid nodule 03/2010   aspiration    Past Surgical History:  Procedure Laterality Date   ABDOMINAL HYSTERECTOMY     CESAREAN SECTION     SHOULDER SURGERY  2011   LEFT    Allergies: Codeine and Hydroxychloroquine  Medications: Prior to Admission medications   Medication Sig Start Date End Date Taking? Authorizing Provider  Ascorbic Acid (VITAMIN C PO) Take 1 tablet by mouth daily.   Yes [provider]  atorvastatin (LIPITOR) 40 MG tablet Take 1 tablet (40 mg total) by mouth daily. 12/30/21  Yes Maximiano Coss, NP  dicyclomine (BENTYL) 20 MG tablet Take 10 mg by mouth 3 (three) times daily. 03/12/22  Yes [provider]  estradiol (ESTRACE VAGINAL) 0.1 MG/GM vaginal cream Place 1 g vaginally 3 (three) times a week. 01/12/22  Yes Chrzanowski, Jami B, NP  folic acid (FOLVITE) 1 MG tablet Take 1 mg by mouth in the morning. 02/27/19  Yes [provider]  Ibuprofen 200 MG CAPS Take 400 mg by mouth every 6 (six) hours as needed (for pain).   Yes [provider]  metFORMIN (GLUCOPHAGE) 500 MG tablet Take 1 tablet (500 mg total) by mouth 2 (two) times daily with a meal. 01/13/22  Yes Maximiano Coss, NP  methotrexate 2.5 MG tablet Take 10 mg by mouth every Wednesday. 02/27/19  Yes [provider]  Multiple Vitamin (MULTIVITAMIN) tablet Take 1 tablet by mouth daily with breakfast.   Yes [provider]  naproxen sodium (ALEVE) 220 MG tablet Take 220-440 mg by mouth 2 (two) times daily as needed (for pain).   Yes [provider]  olmesartan (BENICAR) 40 MG tablet Take 1 tablet (40 mg total) by mouth daily. 02/16/22 05/17/22 Yes BranchRoyetta Crochet, MD  Omega-3 Fatty Acids (FISH OIL PO) Take 1 capsule by mouth in the morning and at bedtime.   Yes [provider]  omeprazole (PRILOSEC) 40 MG capsule Take 40 mg by mouth daily before breakfast. 03/04/22  Yes [provider]  ondansetron (ZOFRAN) 4 MG tablet Take 4 mg by mouth 3 (three) times daily as needed for nausea or vomiting. 03/12/22  Yes [provider]  buPROPion (WELLBUTRIN SR) 150 MG 12 hr tablet Take 1 tablet (150 mg total) by mouth 2 (two) times daily. Patient not taking: Reported on 03/17/2022 06/30/21   Maximiano Coss, NP  chlorthalidone (HYGROTON) 25 MG tablet Take 1 tablet (25 mg total) by mouth daily. Patient not taking: Reported on 03/17/2022 01/06/22   Janina Mayo, MD  furosemide (LASIX)  40 MG tablet Take 1 tablet (40 mg total) by mouth daily. Patient not taking: Reported on 03/17/2022 02/16/22   Janina Mayo, MD     Family History  Problem Relation Age of Onset   Asthma Mother        doe not know mother well, sister advised her   Diabetes Mother    Heart disease Father        heart attack   Hypertension Father     Social History   Socioeconomic History   Marital status: Widowed    Spouse name: Not on file   Number of children: 2   Years of education: Not on file   Highest education level: Not on file  Occupational  History   Occupation: accountant  Tobacco Use   Smoking status: Former    Packs/day: 1.00    Years: 41.00    Total pack years: 41.00    Types: Cigarettes   Smokeless tobacco: Never  Vaping Use   Vaping Use: Never used  Substance and Sexual Activity   Alcohol use: Not Currently    Alcohol/week: 3.0 standard drinks of alcohol    Types: 3 Standard drinks or equivalent per week    Comment: 3/week   Drug use: No   Sexual activity: Not Currently    Partners: Male  Other Topics Concern   Not on file  Social History Narrative   Not on file   Social Determinants of Health   Financial Resource Strain: Low Risk  (11/19/2018)   Overall Financial Resource Strain (CARDIA)    Difficulty of Paying Living Expenses: Not hard at all  Food Insecurity: No Food Insecurity (11/19/2018)   Hunger Vital Sign    Worried About Running Out of Food in the Last Year: Never true    Ran Out of Food in the Last Year: Never true  Transportation Needs: No Transportation Needs (11/19/2018)   PRAPARE - Hydrologist (Medical): No    Lack of Transportation (Non-Medical): No  Physical Activity: Inactive (11/19/2018)   Exercise Vital Sign    Days of Exercise per Week: 0 days    Minutes of Exercise per Session: 0 min  Stress: No Stress Concern Present (11/19/2018)   [place]    Feeling of Stress : Only a little  Social Connections: Moderately Isolated (11/19/2018)   Social Connection and Isolation Panel [NHANES]    Frequency of Communication with Friends and Family: Never    Frequency of Social Gatherings with Friends and Family: Never    Attends Religious Services: More than 4 times per year    Active Member of [employer] or Organizations: No    Attends Archivist Meetings: Never    Marital Status: Widowed      Review of Systems: A 12 point ROS discussed and pertinent positives are indicated in the HPI above.   All other systems are negative.  Review of Systems  Constitutional:  Negative for fatigue and fever.  HENT:  Negative for congestion.   Respiratory:  Negative for cough and shortness of breath.   Gastrointestinal:  Positive for abdominal pain. Negative for diarrhea, nausea and vomiting.    Vital Signs: BP 109/66 (BP Location: Left Arm)   Pulse 89   Temp 97.7 F (36.5 C) (Oral)   Resp 16   Ht '5\' 2"'$  (1.575 m)   Wt 132 lb 15 oz (60.3 kg)   SpO2 93%  BMI 24.31 kg/m     Physical Exam Vitals and nursing note reviewed.  Constitutional:      Appearance: She is well-developed.  HENT:     Head: Normocephalic and atraumatic.  Eyes:     Conjunctiva/sclera: Conjunctivae normal.  Cardiovascular:     Rate and Rhythm: Regular rhythm. Tachycardia present.  Pulmonary:     Effort: Pulmonary effort is normal.     Comments: Diminished bilateral bases.  Abdominal:     Tenderness: There is abdominal tenderness (mild diffuse).  Musculoskeletal:        General: Normal range of motion.     Cervical back: Normal range of motion.     Right lower leg: Edema (+1) present.     Left lower leg: Edema (+1) present.  Skin:    General: Skin is warm.  Neurological:     Mental Status: She is alert and oriented to person, place, and time.     Imaging: VAS [place] UPPER EXTREMITY VENOUS DUPLEX  Result Date: 03/22/2022 UPPER VENOUS STUDY  Patient Name:  Alyssa Rhodes  Date of Exam:   03/22/2022 Medical Rec #: [id number]     Accession #:    [id number] Date of Birth: 1961-07-25     Patient Gender: F Patient Age:   35 years Exam Location:  [facility] Procedure:      VAS [place] UPPER EXTREMITY VENOUS DUPLEX Referring Phys: Otelia Santee --------------------------------------------------------------------------------  Indications: Swelling Risk Factors: None identified. Comparison Study: No prior studies. Performing Technologist: Oliver Hum RVT  Examination Guidelines: A complete evaluation includes B-mode  imaging, spectral Doppler, color Doppler, and power Doppler as needed of all accessible portions of each vessel. Bilateral testing is considered an integral part of a complete examination. Limited examinations for reoccurring indications may be performed as noted.  Right Findings: +----------+------------+---------+-----------+----------+-------+ RIGHT     CompressiblePhasicitySpontaneousPropertiesSummary +----------+------------+---------+-----------+----------+-------+ Subclavian    Full       Yes       Yes                      +----------+------------+---------+-----------+----------+-------+  Left Findings: +----------+------------+---------+-----------+----------+-------+ LEFT      CompressiblePhasicitySpontaneousPropertiesSummary +----------+------------+---------+-----------+----------+-------+ IJV           Full       Yes       Yes                      +----------+------------+---------+-----------+----------+-------+ Subclavian    Full       Yes       Yes                      +----------+------------+---------+-----------+----------+-------+ Axillary      Full       Yes       Yes                      +----------+------------+---------+-----------+----------+-------+ Brachial      Full       Yes       Yes                      +----------+------------+---------+-----------+----------+-------+ Radial        Full                                          +----------+------------+---------+-----------+----------+-------+  Ulnar         Full                                          +----------+------------+---------+-----------+----------+-------+ Cephalic      Full                                          +----------+------------+---------+-----------+----------+-------+ Basilic       Full                                          +----------+------------+---------+-----------+----------+-------+  Summary:  Right: No evidence of thrombosis  in the subclavian.  Left: No evidence of deep vein thrombosis in the upper extremity. No evidence of superficial vein thrombosis in the upper extremity.  *See table(s) above for measurements and observations.    Preliminary    VAS [place] LOWER EXTREMITY VENOUS (DVT)  Result Date: 03/19/2022  Lower Venous DVT Study Patient Name:  Alyssa Rhodes  Date of Exam:   03/18/2022 Medical Rec #: [id number]     Accession #:    [id number] Date of Birth: 02-Aug-1960     Patient Gender: F Patient Age:   50 years Exam Location:  [facility] Procedure:      VAS [place] LOWER EXTREMITY VENOUS (DVT) Referring Phys: Eulogio Bear --------------------------------------------------------------------------------  Indications: Edema.  Risk Factors: Lupus. Limitations: Poor ultrasound/tissue interface. Comparison Study: No previous exams Performing Technologist: Jody Hill RVT, RDMS  Examination Guidelines: A complete evaluation includes B-mode imaging, spectral Doppler, color Doppler, and power Doppler as needed of all accessible portions of each vessel. Bilateral testing is considered an integral part of a complete examination. Limited examinations for reoccurring indications may be performed as noted. The reflux portion of the exam is performed with the patient in reverse Trendelenburg.  +--------+---------------+---------+-----------+----------+--------------------+ RIGHT   CompressibilityPhasicitySpontaneityPropertiesThrombus Aging       +--------+---------------+---------+-----------+----------+--------------------+ CFV     Full           Yes      Yes                                       +--------+---------------+---------+-----------+----------+--------------------+ SFJ     Full                                                              +--------+---------------+---------+-----------+----------+--------------------+ FV Prox Full           Yes      Yes                                        +--------+---------------+---------+-----------+----------+--------------------+ FV Mid  Full           Yes      Yes                                       +--------+---------------+---------+-----------+----------+--------------------+  FV      Full           Yes      Yes                                       Distal                                                                    +--------+---------------+---------+-----------+----------+--------------------+ PFV                    Yes      Yes                  patent by                                                                 color/doppler        +--------+---------------+---------+-----------+----------+--------------------+ POP     Full           Yes      Yes                                       +--------+---------------+---------+-----------+----------+--------------------+ PTV     Full                                                              +--------+---------------+---------+-----------+----------+--------------------+ PERO    Full                                                              +--------+---------------+---------+-----------+----------+--------------------+ Difficulty compressing CFV/SFJ due to patient bearing down due to discomfort.  +--------+---------------+---------+-----------+----------+--------------------+ LEFT    CompressibilityPhasicitySpontaneityPropertiesThrombus Aging       +--------+---------------+---------+-----------+----------+--------------------+ CFV     Full           Yes      Yes                                       +--------+---------------+---------+-----------+----------+--------------------+ SFJ     Full                                                              +--------+---------------+---------+-----------+----------+--------------------+  FV Prox Full           Yes      Yes                                        +--------+---------------+---------+-----------+----------+--------------------+ FV Mid  Full           Yes      Yes                                       +--------+---------------+---------+-----------+----------+--------------------+ FV                     Yes      Yes                  patent by            Distal                                               color/doppler        +--------+---------------+---------+-----------+----------+--------------------+ PFV     Full           Yes      Yes                                       +--------+---------------+---------+-----------+----------+--------------------+ POP     Full           Yes      Yes                                       +--------+---------------+---------+-----------+----------+--------------------+ PTV     Full                                                              +--------+---------------+---------+-----------+----------+--------------------+ PERO    Full                                                              +--------+---------------+---------+-----------+----------+--------------------+ Difficulty compressing CFV/SFJ due to patient bearing down due to discomfort.    Summary: BILATERAL: - No evidence of deep vein thrombosis seen in the lower extremities, bilaterally. -No evidence of popliteal cyst, bilaterally. -Diffuse subcutaneous edema, bilaterally.   *See table(s) above for measurements and observations. Electronically signed by Harold Barban MD on 03/19/2022 at 4:12:23 PM.    Final    US Renal  Result Date: 03/17/2022 CLINICAL DATA:  Acute kidney injury EXAM: RENAL / URINARY TRACT ULTRASOUND COMPLETE COMPARISON:  CT abdomen pelvis 03/17/2022 FINDINGS: Right Kidney: Renal measurements: 10.6 x 3.8 x 4.8 cm = volume: 100 mL. Echogenicity  within normal limits. No mass or hydronephrosis visualized. Left Kidney: Renal measurements: 9.6 x 4.6 x 4.3 cm = volume: 98 mL. Echogenicity  within normal limits. No mass or hydronephrosis visualized. Bladder: Appears normal for degree of bladder distention. Other: Right pleural effusion noted. IMPRESSION: Decreased renal volume bilaterally.  No obstruction or mass. Right pleural effusion Electronically Signed   By: Franchot Gallo M.D.   On: 03/17/2022 12:21   CT CHEST ABDOMEN PELVIS WO CONTRAST  Result Date: 03/17/2022 CLINICAL DATA:  Abdominal pain, dizziness, vomiting EXAM: CT CHEST, ABDOMEN AND PELVIS WITHOUT CONTRAST TECHNIQUE: Multidetector CT imaging of the chest, abdomen and pelvis was performed following the standard protocol without IV contrast. RADIATION DOSE REDUCTION: This exam was performed according to the departmental dose-optimization program which includes automated exposure control, adjustment of the mA and/or kV according to patient size and/or use of iterative reconstruction technique. COMPARISON:  CT abdomen and pelvis done on 02/24/2022 FINDINGS: CT CHEST FINDINGS Cardiovascular: There are scattered calcifications seen thoracic aorta and its major branches. Small pericardial effusion is seen. Mediastinum/Nodes: No significant lymphadenopathy is seen. Lungs/Pleura: Small linear densities are seen in the lateral aspect of right upper lung field and posterior aspects of both lower lung fields. Small to moderate bilateral pleural effusions are seen, more so on the right side. There is no pneumothorax. Musculoskeletal: No acute findings are seen. CT ABDOMEN PELVIS FINDINGS Hepatobiliary: No focal abnormalities are seen in liver. There is no dilation of bile ducts. Gallbladder is unremarkable. Pancreas: No focal abnormalities are seen. Spleen: Spleen appears smaller than usual in size. Adrenals/Urinary Tract: There is 1.4 cm low-density nodule in right adrenal with no significant change suggesting possible adenoma. No follow-up is recommended. There is no hydronephrosis. There are no renal or ureteral stones. Urinary bladder is not  distended. Stomach/Bowel: Stomach is unremarkable. Small bowel loops are not dilated. Appendix is difficult to visualize. In image 86 of series 2, there is a small caliber tubular structure in right lower quadrant, possibly normal appendix. There is no focal pericecal inflammation. There is no significant wall thickening in colon. There is no pericolic stranding. Vascular/Lymphatic: Scattered arterial calcifications are seen. Reproductive: Uterus is not seen. Other: Small ascites is seen in pelvis. There is no pneumoperitoneum. Umbilical hernia with possible small amount of fluid is noted. There is diffuse edema in subcutaneous plane suggesting anasarca. Musculoskeletal: No acute findings are seen. IMPRESSION: Small-to-moderate bilateral pleural effusions, more so on the right side. Small pericardial effusion. There are small linear patchy densities in right upper lung field and both lower lung fields suggesting atelectasis. There is no evidence of intestinal obstruction or pneumoperitoneum. There is no hydronephrosis. Small ascites. There is diffuse edema in abdominal wall suggesting anasarca. 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 Scattered calcifications are seen in aorta and major branches. Electronically Signed   By: Elmer Picker M.D.   On: 03/17/2022 11:43   ECHOCARDIOGRAM COMPLETE  Result Date: 03/15/2022    ECHOCARDIOGRAM REPORT   Patient Name:   Alyssa Rhodes  Date of Exam: 03/15/2022 Medical Rec #:  [id number]     Height:       62.0 in Accession #:    [id number]    Weight:       137.2 lb Date of Birth:  1960/12/23     BSA:          1.629 m Patient Age:    26 years      BP:  118/64 mmHg Patient Gender: F             HR:           97 bpm. Exam Location:  [address] Procedure: 2D Echo, Cardiac Doppler and Color Doppler Indications:    R60.9 Edema  History:        Patient has no prior history of Echocardiogram examinations.                 Risk  Factors:Hypertension, Former Smoker and Dyslipidemia.                 Lupus. Anemia.  Sonographer:    Basilia Jumbo BS, RDCS Referring Phys: [place]  1. Left ventricular ejection fraction, by estimation, is 60 to 65%. The left ventricle has normal function. The left ventricle has no regional wall motion abnormalities. Left ventricular diastolic parameters were normal.  2. Right ventricular systolic function is normal. The right ventricular size is normal. There is normal pulmonary artery systolic pressure. The estimated right ventricular systolic pressure is 10.9 mmHg.  3. The mitral valve is normal in structure. Mild mitral valve regurgitation. No evidence of mitral stenosis.  4. The aortic valve is normal in structure. Aortic valve regurgitation is not visualized. No aortic stenosis is present.  5. The inferior vena cava is normal in size with greater than 50% respiratory variability, suggesting right atrial pressure of 3 mmHg.  6. A small pericardial effusion is present. The pericardial effusion is anterior to the right ventricle and localized with largest diameter near the right atrium with compression of the right atrium. Moderate pleural effusion in the left lateral region.  There is < 25% respirophasic change in mitral valve inflow velocities. Normal IVC diameter. Consdier chest CT for further assessment of Right atrial compression. LIkely pericardial effusion but need to rule out other etiologies. FINDINGS  Left Ventricle: Left ventricular ejection fraction, by estimation, is 60 to 65%. The left ventricle has normal function. The left ventricle has no regional wall motion abnormalities. The left ventricular internal cavity size was normal in size. There is  no left ventricular hypertrophy. Left ventricular diastolic parameters were normal. Normal left ventricular filling pressure. Right Ventricle: The right ventricular size is normal. No increase in right ventricular wall thickness.  Right ventricular systolic function is normal. There is normal pulmonary artery systolic pressure. The tricuspid regurgitant velocity is 2.48 m/s, and  with an assumed right atrial pressure of 3 mmHg, the estimated right ventricular systolic pressure is 60.4 mmHg. Left Atrium: Left atrial size was normal in size. Right Atrium: Right atrial size was normal in size. Pericardium: A small pericardial effusion is present. The pericardial effusion is anterior to the right ventricle and localized near the right atrium. Mitral Valve: The mitral valve is normal in structure. Mild mitral valve regurgitation. No evidence of mitral valve stenosis. Tricuspid Valve: The tricuspid valve is normal in structure. Tricuspid valve regurgitation is mild . No evidence of tricuspid stenosis. Aortic Valve: The aortic valve is normal in structure. Aortic valve regurgitation is not visualized. No aortic stenosis is present. Pulmonic Valve: The pulmonic valve was normal in structure. Pulmonic valve regurgitation is not visualized. No evidence of pulmonic stenosis. Aorta: The aortic root is normal in size and structure. Venous: The inferior vena cava is normal in size with greater than 50% respiratory variability, suggesting right atrial pressure of 3 mmHg. IAS/Shunts: No atrial level shunt detected by color flow Doppler. Additional Comments: There is  a large pleural effusion in the left lateral region. Mild ascites is present.  LEFT VENTRICLE PLAX 2D LVIDd:         3.40 cm   Diastology LVIDs:         1.90 cm   LV e' medial:    12.60 cm/s LV PW:         1.10 cm   LV E/e' medial:  4.4 LV IVS:        0.80 cm   LV e' lateral:   10.70 cm/s LVOT diam:     1.90 cm   LV E/e' lateral: 5.2 LV SV:         52 LV SV Index:   32 LVOT Area:     2.84 cm  RIGHT VENTRICLE             IVC RV Basal diam:  2.50 cm     IVC diam: 1.20 cm RV S prime:     12.00 cm/s TAPSE (M-mode): 1.6 cm RVSP:           27.6 mmHg LEFT ATRIUM             Index        RIGHT ATRIUM            Index LA diam:        2.40 cm 1.47 cm/m   RA Pressure: 3.00 mmHg LA Vol (A2C):   21.3 ml 13.08 ml/m  RA Area:     6.47 cm LA Vol (A4C):   21.5 ml 13.20 ml/m  RA Volume:   9.71 ml   5.96 ml/m LA Biplane Vol: 21.5 ml 13.20 ml/m  AORTIC VALVE LVOT Vmax:   110.00 cm/s LVOT Vmean:  72.200 cm/s LVOT VTI:    0.182 m  AORTA Ao Root diam: 2.60 cm Ao Asc diam:  2.60 cm MITRAL VALVE               TRICUSPID VALVE                            TR Peak grad:   24.6 mmHg MV Decel Time: 225 msec    TR Vmax:        248.00 cm/s MV E velocity: 55.40 cm/s  Estimated RAP:  3.00 mmHg MV A velocity: 64.40 cm/s  RVSP:           27.6 mmHg MV E/A ratio:  0.86                            SHUNTS                            Systemic VTI:  0.18 m                            Systemic Diam: 1.90 cm Fransico Him MD Electronically signed by Fransico Him MD Signature Date/Time: 03/15/2022/2:06:20 PM    Final     Labs:  CBC: Recent Labs    12/29/21 0924 03/17/22 1000 03/18/22 0528 03/20/22 0556  WBC 6.6 7.7 5.5 4.4  HGB 15.1* 12.9 11.7* 11.6*  HCT 45.0 37.0 34.4* 33.5*  PLT 528.0* 437* 363 385    COAGS: No results for input(s): "INR", "APTT" in the last 8760 hours.  BMP: Recent Labs  03/19/22 0750 03/20/22 0556 03/21/22 0502 03/22/22 0625  NA 137 135 133* 133*  K 3.9 3.3* 3.9 3.7  CL 98 95* 95* 92*  CO2 '30 28 26 28  '$ GLUCOSE 129* 116* 110* 110*  BUN 47* 41* 44* 49*  CALCIUM 8.7* 8.9 8.5* 8.6*  CREATININE 3.68* 2.79* 2.78* 2.77*  GFRNONAA 13* 19* 19* 19*    LIVER FUNCTION TESTS: Recent Labs    12/14/21 0944 12/29/21 0924 03/17/22 1000 03/18/22 0528 03/19/22 0750 03/20/22 0556 03/21/22 0502  BILITOT 0.5 0.4 0.8 0.8  --   --   --   AST '31 23 24 21  '$ --   --   --   ALT 38 '30 17 18  '$ --   --   --   ALKPHOS 99 67 52 45  --   --   --   PROT 7.4 7.3 6.9 6.0*  --   --   --   ALBUMIN 4.1 4.1 3.6 3.4* 3.0* 3.2* 2.9*      Assessment and Plan:  61 y.o. female inpatient.  History of HTN, DM, HLD, RA,  Lupus. Presented to the ED at [facility] with swelling to her  left upper extremity and bilateral lower extremities and abdomen and abdominal pain X several months. Found to be in AKI. Team is requesting random renal biopsy for further evaluation of AKI.   US renal from 8.24.23 reads Right Kidney: Renal measurements: 10.6 x 3.8 x 4.8 cm = volume: 100 mL. Echogenicity within normal limits. No mass or hydronephrosis visualized. Left Kidney: Renal measurements: 9.6 x 4.6 x 4.3 cm = volume: 98 mL. Echogenicity within normal limits. No mass or hydronephrosis visualized.BUN 49 Cr 2.77, GFR < 19. She is on heparin prophylactic. Per Epic INR and CBC ordered for 8.20.23. Patient is on subcutaneous prophylactic dose of heparin currently on hold. Last dose given on  Allergies include codeine.   IR consulted for possible renal biopsy. Case has been reviewed and procedure approved by Dr. Maryelizabeth Kaufmann.  Patient tentatively scheduled for 8.30.23.  Team instructed to: Keep Patient to be NPO after midnight Continue to hold prophylactic anticoagulation 24 hours prior to scheduled procedure.  IR will call patient when ready.   Risks and benefits of random renal was discussed with the patient and/or patient's family including, but not limited to bleeding, infection, damage to adjacent structures or low yield requiring additional tests.  All of the questions were answered and there is agreement to proceed.  Consent signed and in chart.   Thank you for this interesting consult.  I greatly enjoyed meeting Alyssa Rhodes and look forward to participating in their care.  A copy of this report was sent to the requesting provider on this date.  Electronically Signed: Jacqualine Mau, NP 03/22/2022, 4:20 PM   I spent a total of 40 Minutes    in face to face in clinical consultation, greater than 50% of which was counseling/coordinating care for random renal biopsy

## 2022-03-22 NOTE — Progress Notes (Signed)
Occupational Therapy Treatment Patient Details Name: Alyssa Rhodes MRN: [id number] DOB: 04/18/1961 Today's Date: 03/22/2022   History of present illness Pt is a 61 yo female admitted with swelling of BLEs and LUE and abdomen for last month.  Pt found to have AKI and pleural and pericardial effusions. PMH: HTN, DM, RA, lupus.   OT comments  Pt presents today with increased pain in LEs and spasmlike movements in BLES R greater than L that make it difficult for her to advance her legs and put feet flat on floor when ambulating. Pt in great amount of pain and only tolerated walking about 5 feet today. At this point in time, do not feel it is safe for this pt to d/c home without significant 24 hour assist. Pt unable to walk efficiently or safely.  Pt may need SNF rehab before returning home as she states she does not have 24/7 assist.  Pt works full time and at this level would not be able to return to work. Will continue to see with focus on standing and mobilizing pt during adls.    Recommendations for follow up therapy are one component of a multi-disciplinary discharge planning process, led by the attending physician.  Recommendations may be updated based on patient status, additional functional criteria and insurance authorization.    Follow Up Recommendations  Skilled nursing-short term rehab (<3 hours/day)    Assistance Recommended at Discharge Frequent or constant Supervision/Assistance  Patient can return home with the following  A lot of help with walking and/or transfers;A lot of help with bathing/dressing/bathroom;Assistance with cooking/housework;Assist for transportation;Help with stairs or ramp for entrance   Equipment Recommendations  Other (comment) (tbd)    Recommendations for Other Services      Precautions / Restrictions Precautions Precautions: Fall Precaution Comments: Pt with new spasms in legs that cause legs to lift up from underneath her when standing or walking and  make pt a fall risk. Restrictions Weight Bearing Restrictions: No       Mobility Bed Mobility Overal bed mobility: Needs Assistance Bed Mobility: Supine to Sit     Supine to sit: Min assist     General bed mobility comments: Pt in pain with all mobility today. Pt erquired increased assist to get to EOB due sudden posteior loss of balance x2.    Transfers Overall transfer level: Needs assistance Equipment used: Rolling walker (2 wheels) Transfers: Sit to/from Stand, Bed to chair/wheelchair/BSC Sit to Stand: Mod assist Stand pivot transfers: Mod assist         General transfer comment: verbal cues for hand placement, assist for weakness and due to B ankle/feet pain, unsteady.     Balance Overall balance assessment: Needs assistance Sitting-balance support: Feet supported Sitting balance-Leahy Scale: Fair Sitting balance - Comments: pt with posterior lean today Postural control: Posterior lean Standing balance support: Bilateral upper extremity supported, Reliant on assistive device for balance Standing balance-Leahy Scale: Poor Standing balance comment: Pt heavily reliant on walker for all mobility and required assist from +1 outside of walker.                           ADL either performed or assessed with clinical judgement   ADL Overall ADL's : Needs assistance/impaired Eating/Feeding: Independent;Sitting   Grooming: Wash/dry hands;Wash/dry face;Oral care;Min guard;Standing Grooming Details (indicate cue type and reason): Pt unable to stand at sink today due to pain and spasm like movements in legs that would not  allow her to keep both legs flat on floor.  Pt did not know when spasms would occur but would just lift up R or L leg without warning and lose balance.             Lower Body Dressing: Moderate assistance;Sit to/from stand;Cueing for compensatory techniques Lower Body Dressing Details (indicate cue type and reason): Pt donned socks EOB today  with assist for balance due to posterior lean. Pt unable to donn pants afterward due to pain in legs. Toilet Transfer: Moderate assistance;Stand-pivot;Rolling walker (2 wheels) Toilet Transfer Details (indicate cue type and reason): Pt pivoted to BSC in leiu of walking to bathroom due to pain in legs and spasms making walking unsafe today Toileting- Clothing Manipulation and Hygiene: Sit to/from stand;Minimal assistance;Cueing for compensatory techniques       Functional mobility during ADLs: Moderate assistance;Rolling walker (2 wheels) General ADL Comments: Pt in a great amount of pain when up on feet. Pt used walker requiring cues when standing to utilize walker.  Pt walked about 5 steps in 5 minutes.    Extremity/Trunk Assessment Upper Extremity Assessment Upper Extremity Assessment: RUE deficits/detail;LUE deficits/detail RUE Deficits / Details: swollen but functional RUE Sensation: WNL RUE Coordination: WNL LUE Deficits / Details: WFL but swollen LUE Sensation: WNL LUE Coordination: WNL   Lower Extremity Assessment Lower Extremity Assessment: Defer to PT evaluation        Vision   Vision Assessment?: No apparent visual deficits   Perception Perception Perception: Not tested   Praxis Praxis Praxis: Intact    Cognition Arousal/Alertness: Awake/alert Behavior During Therapy: WFL for tasks assessed/performed Overall Cognitive Status: Within Functional Limits for tasks assessed                                 General Comments: AxO x 3 very 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  Stated her pain started "months ago" and has been back and forth between MD's.  Pt lives with her son but has nobody that can be with her all the time.        Exercises      Shoulder Instructions       General Comments Pt with noticable decrease in functional mobility with walker today. Pt in a lot more pain and unable to put R foot flat on ground to bear weight.  Pt  unsafe on feet and will not be safe to go home without someone with her at all times.    Pertinent Vitals/ Pain       Pain Assessment Pain Assessment: 0-10 Pain Score: 8  Pain Location: B Feet and ankles 8/10, abdomen 5/10 Pain Descriptors / Indicators: Tightness, Grimacing Pain Intervention(s): Limited activity within patient's tolerance, Monitored during session, Premedicated before session, Repositioned  Home Living                                          Prior Functioning/Environment              Frequency  Min 2X/week        Progress Toward Goals  OT Goals(current goals can now be found in the care plan section)  Progress towards OT goals: Not progressing toward goals - comment (Pt with increased needs with mobility today)  Acute Rehab OT Goals Patient Stated Goal: to  get rid of the pain in my legs when standing OT Goal Formulation: With patient Time For Goal Achievement: 04/01/22 Potential to Achieve Goals: Good ADL Goals Pt Will Perform Grooming: with supervision;standing Pt Will Perform Lower Body Bathing: with supervision;sit to/from stand Pt Will Perform Lower Body Dressing: with supervision;sit to/from stand Pt Will Perform Tub/Shower Transfer: Tub transfer;with supervision;ambulating;rolling walker;3 in 1 Additional ADL Goal #1: Pt will walk to bathroom and complete all toileting with supervision.  Plan Discharge plan needs to be updated    Co-evaluation                 AM-PAC OT "6 Clicks" Daily Activity     Outcome Measure   Help from another person eating meals?: None Help from another person taking care of personal grooming?: A Little Help from another person toileting, which includes using toliet, bedpan, or urinal?: A Lot Help from another person bathing (including washing, rinsing, drying)?: A Lot Help from another person to put on and taking off regular upper body clothing?: None Help from another person to put on  and taking off regular lower body clothing?: A Lot 6 Click Score: 17    End of Session Equipment Utilized During Treatment: Rolling walker (2 wheels)  OT Visit Diagnosis: Unsteadiness on feet (R26.81)   Activity Tolerance Patient limited by pain   Patient Left in chair;with call bell/phone within reach   Nurse Communication Mobility status        Time: 1141-1200 OT Time Calculation (min): 19 min  Charges: OT General Charges $OT Visit: 1 Visit OT Treatments $Self Care/Home Management : 8-22 mins  Glenford Peers 03/22/2022, 12:13 PM

## 2022-03-23 ENCOUNTER — Inpatient Hospital Stay (HOSPITAL_COMMUNITY): Payer: BC Managed Care – PPO

## 2022-03-23 ENCOUNTER — Encounter (HOSPITAL_COMMUNITY): Payer: Self-pay | Admitting: Internal Medicine

## 2022-03-23 DIAGNOSIS — R601 Generalized edema: Secondary | ICD-10-CM | POA: Diagnosis not present

## 2022-03-23 DIAGNOSIS — N179 Acute kidney failure, unspecified: Secondary | ICD-10-CM | POA: Diagnosis not present

## 2022-03-23 DIAGNOSIS — E876 Hypokalemia: Secondary | ICD-10-CM | POA: Diagnosis not present

## 2022-03-23 LAB — BASIC METABOLIC PANEL
Anion gap: 13 (ref 5–15)
BUN: 51 mg/dL — ABNORMAL HIGH (ref 8–23)
CO2: 27 mmol/L (ref 22–32)
Calcium: 8.2 mg/dL — ABNORMAL LOW (ref 8.9–10.3)
Chloride: 92 mmol/L — ABNORMAL LOW (ref 98–111)
Creatinine, Ser: 2.68 mg/dL — ABNORMAL HIGH (ref 0.44–1.00)
GFR, Estimated: 20 mL/min — ABNORMAL LOW (ref >60)
Glucose, Bld: 131 mg/dL — ABNORMAL HIGH (ref 70–99)
Potassium: 3.2 mmol/L — ABNORMAL LOW (ref 3.5–5.1)
Sodium: 132 mmol/L — ABNORMAL LOW (ref 135–145)

## 2022-03-23 LAB — PROTIME-INR
INR: 1 (ref 0.8–1.2)
Prothrombin Time: 13 seconds (ref 11.4–15.2)

## 2022-03-23 LAB — CBC
HCT: 35.4 % — ABNORMAL LOW (ref 36.0–46.0)
Hemoglobin: 11.9 g/dL — ABNORMAL LOW (ref 12.0–15.0)
MCH: 32.3 pg (ref 26.0–34.0)
MCHC: 33.6 g/dL (ref 30.0–36.0)
MCV: 96.2 fL (ref 80.0–100.0)
Platelets: 362 10*3/uL (ref 150–400)
RBC: 3.68 MIL/uL — ABNORMAL LOW (ref 3.87–5.11)
RDW: 14.5 % (ref 11.5–15.5)
WBC: 4.3 10*3/uL (ref 4.0–10.5)
nRBC: 0 % (ref 0.0–0.2)

## 2022-03-23 LAB — GLUCOSE, CAPILLARY: Glucose-Capillary: 113 mg/dL — ABNORMAL HIGH (ref 70–99)

## 2022-03-23 MED ORDER — SODIUM CHLORIDE 0.9 % IV SOLN
INTRAVENOUS | Status: AC
  Filled 2022-03-23: qty 250

## 2022-03-23 MED ORDER — NALOXONE HCL 0.4 MG/ML IJ SOLN
INTRAMUSCULAR | Status: AC
  Filled 2022-03-23: qty 1

## 2022-03-23 MED ORDER — FLUMAZENIL 0.5 MG/5ML IV SOLN
INTRAVENOUS | Status: AC
  Filled 2022-03-23: qty 5

## 2022-03-23 MED ORDER — FENTANYL CITRATE (PF) 100 MCG/2ML IJ SOLN
INTRAMUSCULAR | Status: AC
  Filled 2022-03-23: qty 2

## 2022-03-23 MED ORDER — MIDAZOLAM HCL 2 MG/2ML IJ SOLN
INTRAMUSCULAR | Status: AC | PRN
  Administered 2022-03-23: .5 mg via INTRAVENOUS

## 2022-03-23 MED ORDER — FENTANYL CITRATE (PF) 100 MCG/2ML IJ SOLN
INTRAMUSCULAR | Status: AC | PRN
  Administered 2022-03-23: 25 ug via INTRAVENOUS

## 2022-03-23 MED ORDER — POTASSIUM CHLORIDE CRYS ER 20 MEQ PO TBCR
40.0000 meq | EXTENDED_RELEASE_TABLET | Freq: Once | ORAL | Status: AC
  Administered 2022-03-23: 40 meq via ORAL
  Filled 2022-03-23: qty 2

## 2022-03-23 MED ORDER — MIDAZOLAM HCL 2 MG/2ML IJ SOLN
INTRAMUSCULAR | Status: AC
  Filled 2022-03-23: qty 2

## 2022-03-23 NOTE — Progress Notes (Signed)
PT Cancellation Note  Patient Details Name: Alyssa Rhodes MRN: [id number] DOB: January 06, 1961   Cancelled Treatment:     CT LEFT RENAL CORE BX    Will attempt to see another day as shedule permits.  D/C plans are no for SNF.  Rica Koyanagi  PTA Acute  Rehabilitation Services Office M-F          [phone] Weekend pager [phone]

## 2022-03-23 NOTE — Plan of Care (Signed)

## 2022-03-23 NOTE — Progress Notes (Signed)
[facility] PROGRESS NOTE   Alyssa Rhodes MCN:[id number] DOB: [birth year] DOA: 03/17/2022  PCP: Doretha Sou, MD  Brief History/Interval Summary: 61 y.o. female with medical history significant for but not limited to hypertension, uncontrolled diabetes mellitus type 2 with recent hemoglobin A1c of 13.5, dyslipidemia, history of seronegative rheumatoid arthritis,  who presented with swelling of her left upper extremity and bilateral lower extremities and abdomen with epigastric abdominal discomfort and pain that has been going on for several months now.  Patient has a normal baseline creatinine of 0.7-0.9 and states that she has had worsening swelling and pain of her extremities as well as abdomen.  States that she vomited twice and has not really been able to eat anything given her gastric distention and epigastric pain.  She has been taking ibuprofen at least 2-3 times a day due to her pain and has been having some abdominal discomfort and diarrhea with metformin which she stopped because of this and subsequently states that her blood sugars elevated.  Given her lower extremity swelling she was seen by cardiology in outpatient setting who did an echocardiogram which showed a preserved ejection fracture with mild pericardial effusion.  She was initiated on furosemide but did not take this on a consistent basis.  She has been having worsening lower extremity edema and has been following Dr. Amil Amen for rheumatology care and had another medication that she was taken for her lupus.  She notes that she has had worsening abdominal discomfort and distention as well as lower extremity swelling and edema with no appreciable rashes.  She denies any other concerns or complaints at this time.  Given her persistence in her symptoms and lower extremity pain and abdominal pain she presented to the ED for further evaluation.  [facility] hospitalist was consulted for further evaluation and will admit this  patient for AKI with bilateral lower extremity edema and anasarca.   Consultants: Nephrology  Procedures: Renal biopsy 8/30    Subjective/Interval History: Patient denies any complaints this morning.  Looking forward to her biopsy.  Admits to some nausea but no vomiting.  No abdominal pain.     Assessment/Plan:  Acute kidney injury This is in the setting of NSAID use along with other nephrotoxic medications including Benicar and furosemide and chlorthalidone.  Baseline creatinine 0.7-0.9.  Presented with creatinine of 4.0. Renal ultrasound did not show any hydronephrosis. Nephrology was consulted.  Renal biopsy to be done today. Remains on high-dose furosemide.  Also on metolazone.  Anasarca with pleural effusions and pericardial effusion Likely secondary to renal failure.  On high-dose diuretics as mentioned earlier. Echocardiogram showed normal systolic function.  Echo also showed small pericardial effusion.  Adrenal adenoma Outpatient management.  Seronegative rheumatoid arthritis Followed by rheumatology.  Has been on methotrexate.  Currently on hold.  Apparently she does not have a history of lupus.  Hyponatremia Stable for the most part.  Hypokalemia To be repleted.  Normocytic anemia No evidence of overt bleeding.  Diabetes mellitus type 2 Recent HbA1c was 13.5.  Repeat A1c here is 7.5.  Monitor CBGs.  Thrombocytosis Resolved.  Likely reactive.  DVT Prophylaxis: Subcutaneous heparin Code Status: Full code Family Communication: Discussed with the patient Disposition Plan: Hopefully return home when improved  Status is: Inpatient Remains inpatient appropriate because: Acute kidney injury      Medications: Scheduled:  atorvastatin  40 mg Oral Daily   diclofenac Sodium  2 g Topical QID   flumazenil       folic  acid  1 mg Oral Daily   heparin  5,000 Units Subcutaneous Q8H   metolazone  5 mg Oral Daily   multivitamin with minerals  1 tablet Oral  Daily   naloxone       pantoprazole  40 mg Oral Daily   Continuous:  furosemide 120 mg (03/23/22 0519)   sodium chloride     FAO:ZHYQMVHQIONGE **OR** acetaminophen, fentaNYL (SUBLIMAZE) injection, flumazenil, hydrALAZINE, naloxone, ondansetron **OR** ondansetron (ZOFRAN) IV, mouth rinse, oxyCODONE, sodium chloride  Antibiotics: Anti-infectives (From admission, onward)    None       Objective:  Vital Signs  Vitals:   03/23/22 1125 03/23/22 1130 03/23/22 1135 03/23/22 1200  BP: 110/66 106/63 101/70 92/62  Pulse: 96 94 96 93  Resp: '10 11 16 14  '$ Temp:    98.1 F (36.7 C)  TempSrc:      SpO2: 100% 100% 100% 93%  Weight:      Height:        Intake/Output Summary (Last 24 hours) at 03/23/2022 1239 Last data filed at 03/23/2022 0451 Gross per 24 hour  Intake 182 ml  Output 1050 ml  Net -868 ml   Filed Weights   03/19/22 0500 03/20/22 0500 03/21/22 0500  Weight: 63 kg 66.7 kg 60.3 kg    General appearance: Awake alert.  In no distress Resp: Normal effort at rest.  Crackles bilateral bases.  No wheezing or rhonchi. Cardio: S1-S2 is normal regular.  No S3-S4.  No rubs murmurs or bruit GI: Abdomen is soft.  Nontender nondistended.  Bowel sounds are present normal.  No masses organomegaly Extremities: No edema.  Full range of motion of lower extremities. Neurologic: Alert and oriented x3.  No focal neurological deficits.    Lab Results:  Data Reviewed: I have personally reviewed following labs and reports of the imaging studies  CBC: Recent Labs  Lab 03/17/22 1000 03/18/22 0528 03/20/22 0556 03/23/22 0559  WBC 7.7 5.5 4.4 4.3  NEUTROABS 6.5 4.0  --   --   HGB 12.9 11.7* 11.6* 11.9*  HCT 37.0 34.4* 33.5* 35.4*  MCV 91.4 95.3 94.1 96.2  PLT 437* 363 385 952    Basic Metabolic Panel: Recent Labs  Lab 03/18/22 0528 03/19/22 0750 03/20/22 0556 03/21/22 0502 03/22/22 0625 03/23/22 0559  NA 136 137 135 133* 133* 132*  K 3.9 3.9 3.3* 3.9 3.7 3.2*  CL 99  98 95* 95* 92* 92*  CO2 '24 30 28 26 28 27  '$ GLUCOSE 108* 129* 116* 110* 110* 131*  BUN 41* 47* 41* 44* 49* 51*  CREATININE 3.93* 3.68* 2.79* 2.78* 2.77* 2.68*  CALCIUM 9.2 8.7* 8.9 8.5* 8.6* 8.2*  MG 2.0  --   --   --   --   --   PHOS 5.8* 5.7* 5.0* 4.6  --   --     GFR: Estimated Creatinine Clearance: 18.9 mL/min (A) (by C-G formula based on SCr of 2.68 mg/dL (H)).  Liver Function Tests: Recent Labs  Lab 03/17/22 1000 03/18/22 0528 03/19/22 0750 03/20/22 0556 03/21/22 0502  AST 24 21  --   --   --   ALT 17 18  --   --   --   ALKPHOS 52 45  --   --   --   BILITOT 0.8 0.8  --   --   --   PROT 6.9 6.0*  --   --   --   ALBUMIN 3.6 3.4* 3.0* 3.2* 2.9*  Recent Labs  Lab 03/17/22 1000  LIPASE 28    Coagulation Profile: Recent Labs  Lab 03/23/22 0559  INR 1.0     CBG: Recent Labs  Lab 03/18/22 0822 03/19/22 0736 03/21/22 0833 03/22/22 0826 03/23/22 0741  GLUCAP 107* 144* 124* 86 113*     Radiology Studies: VAS [place] UPPER EXTREMITY VENOUS DUPLEX  Result Date: 03/22/2022 UPPER VENOUS STUDY  Patient Name:  MYLEY BAHNER  Date of Exam:   03/22/2022 Medical Rec #: [id number]     Accession #:    [id number] Date of Birth: [birth year]     Patient Gender: F Patient Age:   26 years Exam Location:  [facility] Procedure:      VAS [place] UPPER EXTREMITY VENOUS DUPLEX Referring Phys: Otelia Santee --------------------------------------------------------------------------------  Indications: Swelling Risk Factors: None identified. Comparison Study: No prior studies. Performing Technologist: Oliver Hum RVT  Examination Guidelines: A complete evaluation includes B-mode imaging, spectral Doppler, color Doppler, and power Doppler as needed of all accessible portions of each vessel. Bilateral testing is considered an integral part of a complete examination. Limited examinations for reoccurring indications may be performed as noted.  Right Findings:  +----------+------------+---------+-----------+----------+-------+ RIGHT     CompressiblePhasicitySpontaneousPropertiesSummary +----------+------------+---------+-----------+----------+-------+ Subclavian    Full       Yes       Yes                      +----------+------------+---------+-----------+----------+-------+  Left Findings: +----------+------------+---------+-----------+----------+-------+ LEFT      CompressiblePhasicitySpontaneousPropertiesSummary +----------+------------+---------+-----------+----------+-------+ IJV           Full       Yes       Yes                      +----------+------------+---------+-----------+----------+-------+ Subclavian    Full       Yes       Yes                      +----------+------------+---------+-----------+----------+-------+ Axillary      Full       Yes       Yes                      +----------+------------+---------+-----------+----------+-------+ Brachial      Full       Yes       Yes                      +----------+------------+---------+-----------+----------+-------+ Radial        Full                                          +----------+------------+---------+-----------+----------+-------+ Ulnar         Full                                          +----------+------------+---------+-----------+----------+-------+ Cephalic      Full                                          +----------+------------+---------+-----------+----------+-------+ Basilic       Full                                          +----------+------------+---------+-----------+----------+-------+  Summary:  Right: No evidence of thrombosis in the subclavian.  Left: No evidence of deep vein thrombosis in the upper extremity. No evidence of superficial vein thrombosis in the upper extremity.  *See table(s) above for measurements and observations.  Diagnosing physician: Deitra Mayo MD Electronically signed by  Deitra Mayo MD on 03/22/2022 at 4:57:13 PM.    Final        LOS: 5 days   [facility] Pager on [email or web address]  03/23/2022, 12:39 PM

## 2022-03-23 NOTE — Procedures (Signed)
Interventional Radiology Procedure Note  Procedure: CT LEFT RENAL CORE BX    Complications: None  Estimated Blood Loss:  0  Findings: 16 G CORE X2  IN SALINE  Tamera Punt, MD

## 2022-03-23 NOTE — TOC Progression Note (Signed)
Transition of Care [facility]) - Progression Note    Patient Details  Name: Alyssa Rhodes MRN: [id number] Date of Birth: [birth year]  Transition of Care [facility]) CM/SW [place], LCSW Phone Number: 03/23/2022, 11:10 AM  Clinical Narrative:    Met with pt and reviewed bed offers. Pt has accepted bed offer for SNF placement at [facility]. Insurance authorization will need to be started once pt is medically ready for transfer.    Expected Discharge Plan: [place] Barriers to Discharge: Continued Medical Work up  Expected Discharge Plan and Services Expected Discharge Plan: [place] In-house Referral: NA Discharge Planning Services: NA Post Acute Care Choice: Durable Medical Equipment, Home Health Living arrangements for the past 2 months: Single Family Home                                       Social Determinants of Health (SDOH) Interventions    Readmission Risk Interventions     No data to display

## 2022-03-24 DIAGNOSIS — N179 Acute kidney failure, unspecified: Secondary | ICD-10-CM | POA: Diagnosis not present

## 2022-03-24 DIAGNOSIS — R601 Generalized edema: Secondary | ICD-10-CM | POA: Diagnosis not present

## 2022-03-24 LAB — CBC
HCT: 34.5 % — ABNORMAL LOW (ref 36.0–46.0)
Hemoglobin: 11.9 g/dL — ABNORMAL LOW (ref 12.0–15.0)
MCH: 32.5 pg (ref 26.0–34.0)
MCHC: 34.5 g/dL (ref 30.0–36.0)
MCV: 94.3 fL (ref 80.0–100.0)
Platelets: 309 10*3/uL (ref 150–400)
RBC: 3.66 MIL/uL — ABNORMAL LOW (ref 3.87–5.11)
RDW: 14.6 % (ref 11.5–15.5)
WBC: 9.5 10*3/uL (ref 4.0–10.5)
nRBC: 0 % (ref 0.0–0.2)

## 2022-03-24 LAB — BASIC METABOLIC PANEL
Anion gap: 11 (ref 5–15)
BUN: 50 mg/dL — ABNORMAL HIGH (ref 8–23)
CO2: 32 mmol/L (ref 22–32)
Calcium: 8.4 mg/dL — ABNORMAL LOW (ref 8.9–10.3)
Chloride: 89 mmol/L — ABNORMAL LOW (ref 98–111)
Creatinine, Ser: 2.69 mg/dL — ABNORMAL HIGH (ref 0.44–1.00)
GFR, Estimated: 20 mL/min — ABNORMAL LOW (ref >60)
Glucose, Bld: 114 mg/dL — ABNORMAL HIGH (ref 70–99)
Potassium: 3.7 mmol/L (ref 3.5–5.1)
Sodium: 132 mmol/L — ABNORMAL LOW (ref 135–145)

## 2022-03-24 LAB — GLUCOSE, CAPILLARY: Glucose-Capillary: 105 mg/dL — ABNORMAL HIGH (ref 70–99)

## 2022-03-24 MED ORDER — FUROSEMIDE 10 MG/ML IJ SOLN
80.0000 mg | Freq: Two times a day (BID) | INTRAMUSCULAR | Status: DC
Start: 2022-03-24 — End: 2022-03-26
  Administered 2022-03-24 – 2022-03-26 (×4): 80 mg via INTRAVENOUS
  Filled 2022-03-24 (×4): qty 8

## 2022-03-24 NOTE — Plan of Care (Signed)

## 2022-03-24 NOTE — Progress Notes (Signed)
[facility] Progress Note    Home meds: atrovastatin, estradiol, methotrexate '10mg'$  q wed, MVI, olmesartan 40 qd, buproprion, chlorthalidone 25 qd, furosemide 40 qd      Renal US - 10.6/ 9.6 cm kidneys w/o hydro, normal bladder      Jan 2017 - ANA +speckled, titer >= 1:1280       Jan 2017 - RF = +46 (15-41 = low, >= 42 is high)       HIV neg, hep B/C negative, UP/C ratio = 0.22         Assessment/ Plan Acute kidney injury - suspect hemodynamic in setting of anasarca, decreased EABV, renal hypoperfusion/ NSAID use + ARB effect.  Other potential dx's are autoimmune, paraproteinemia.  Albumin dropped to mid 3s in last 2 mos but no sig proteinuria, so doesn't look like neph syndrome. Have ordered complements, myeloma labs and some serologies, all pending. Cont to hold ARB and nsaids. Started IV lasix 80 bid and did not respond, but is now doing better w/ ^120 mg IV tid + zaroxolyn daily. C3C4 NL, HBV, HCV, HIV, ANCA, GBM all neg. K/L elevated which could be seen w/ renal failure. Florid WBC's in urine but 0-5 RBC's w/ rare bact. UPC only 0.22.  ANA pos.  - Edema/ pain starting to improve and creat improving and initially holding off on kidney biopsy given inactive urine, minimal proteinuria. But pt was not feeling well, renal function not improving as much as I would like and patient wanted an answer. Decided to bx in case prednisone for AIN is an option.  Hypotensive which is why Lasix has been held but she has received Metolazone.   Will stop the Metolazone and restart Lasix at '80mg'$  BID; still significant pos sodium balance. Ted hose to help mobilize fluid (has not put them back on).   - Avoid all nsaids in the future. May have AIN; I d/w the patient and she agrees to do a renal biopsy as steroids may help shorten the course.  Appreciate VIR performing the biopsy on 8/30, no e/o bleeding.  Anasarca: Likely associated with acute kidney injury and sodium retention effect of heavy  NSAIDs with ongoing ARB use.  Starting to diurese as above. - LUE duplex r/o DVT (LUE >>>RUE which is new for her)  Epigastric pain: possible gastritis associated with NSAID use.  Medical management per primary service with H2 RA +/- Carafate (the latter for maximum 72 hours use in setting of acute kidney injury to limit aluminum toxicity). Hypertension: no ARB/ acei for this pt w/ AKI. BP's soft, not on any BP lowering meds.  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 She had been on meloxicam at some point in the past.    Subjective: 750 cc UOP yesterday, BP on low side. Creat 2.69 today. Pt waxes and wanes, feeling better than yest. Legs are not as swollen and not as painful.   Vitals:   03/23/22 1135 03/23/22 1200 03/23/22 2110 03/24/22 0457  BP: 101/70 92/62 (!) 101/59 (!) 94/51  Pulse: 96 93 94 90  Resp: '16 14 19 19  '$ Temp:  98.1 F (36.7 C) 98.1 F (36.7 C) 98 F (36.7 C)  TempSrc:      SpO2: 100% 93% 100% 97%  Weight:    59.5 kg  Height:        Exam: Gen  no distress, looks uncomfortable today No JVD Chest  no rales or wheezing Cor reg no RG ABd soft, distended, mild tenderness in epigastric area Ext 1+ bilat LE edema, improving, LUE >>>RUE Neuro Ox 3, nonfocal    Recent Labs  Lab 03/20/22 0556 03/21/22 0502 03/22/22 0625 03/23/22 0559 03/24/22 0528  HGB 11.6*  --   --  11.9* 11.9*  ALBUMIN 3.2* 2.9*  --   --   --   CALCIUM 8.9 8.5*   < > 8.2* 8.4*  PHOS 5.0* 4.6  --   --   --   CREATININE 2.79* 2.78*   < > 2.68* 2.69*  K 3.3* 3.9   < > 3.2* 3.7   < > = values in this interval not displayed.   No results for input(s): "IRON", "TIBC", "FERRITIN" in the last 168 hours. Inpatient medications:  atorvastatin  40 mg Oral Daily   diclofenac Sodium  2 g Topical QID   folic acid  1 mg Oral Daily   heparin  5,000 Units Subcutaneous  Q8H   metolazone  5 mg Oral Daily   multivitamin with minerals  1 tablet Oral Daily   pantoprazole  40 mg Oral Daily     acetaminophen **OR** acetaminophen, fentaNYL (SUBLIMAZE) injection, hydrALAZINE, ondansetron **OR** ondansetron (ZOFRAN) IV, mouth rinse, oxyCODONE

## 2022-03-24 NOTE — Progress Notes (Signed)
Bilateral ted stockings placed on patient per MD order.

## 2022-03-24 NOTE — Progress Notes (Signed)
[facility] PROGRESS NOTE   Alyssa Rhodes ZOX:[id number] DOB: 1961/03/26 DOA: 03/17/2022  PCP: Doretha Sou, MD  Brief History/Interval Summary: 61 y.o. female with medical history significant for but not limited to hypertension, uncontrolled diabetes mellitus type 2 with recent hemoglobin A1c of 13.5, dyslipidemia, history of seronegative rheumatoid arthritis,  who presented with swelling of her left upper extremity and bilateral lower extremities and abdomen with epigastric abdominal discomfort and pain that has been going on for several months now.  Patient has a normal baseline creatinine of 0.7-0.9 and states that she has had worsening swelling and pain of her extremities as well as abdomen.  States that she vomited twice and has not really been able to eat anything given her gastric distention and epigastric pain.  She has been taking ibuprofen at least 2-3 times a day due to her pain and has been having some abdominal discomfort and diarrhea with metformin which she stopped because of this and subsequently states that her blood sugars elevated.  Given her lower extremity swelling she was seen by cardiology in outpatient setting who did an echocardiogram which showed a preserved ejection fracture with mild pericardial effusion.  She was initiated on furosemide but did not take this on a consistent basis.  She has been having worsening lower extremity edema and has been following Dr. Amil Amen for rheumatology care and had another medication that she was taken for her lupus.  She notes that she has had worsening abdominal discomfort and distention as well as lower extremity swelling and edema with no appreciable rashes.  She denies any other concerns or complaints at this time.  Given her persistence in her symptoms and lower extremity pain and abdominal pain she presented to the ED for further evaluation.  [facility] hospitalist was consulted for further evaluation and will admit this  patient for AKI with bilateral lower extremity edema and anasarca.   Consultants: Nephrology  Procedures: Renal biopsy 8/30    Subjective/Interval History: Denies any complaints this morning.  Biopsy was uneventful.  Denies any abdominal pain or back pain.  No blood in the urine.  However she is not making much urine.       Assessment/Plan:  Acute kidney injury This is in the setting of NSAID use along with other nephrotoxic medications including Benicar and furosemide and chlorthalidone.  Baseline creatinine 0.7-0.9.  Presented with creatinine of 4.0.  Has improved and is noted to be 2.69.  Appears to have plateaued. Renal ultrasound did not show any hydronephrosis. Nephrology was consulted.  Patient underwent renal biopsy on 8/30. Furosemide could not be given at all yesterday due to hypotension.  Nephrology was made aware.   She is noted to be on metolazone. Did not make much urine in the last 24 hours.  Will defer management to nephrology.  Furosemide remains on hold currently.  Anasarca with pleural effusions and pericardial effusion Likely secondary to renal failure.  Was on high-dose diuretics but held since yesterday due to hypotension.   Echocardiogram showed normal systolic function.  Echo also showed small pericardial effusion. She underwent upper and lower extremity venous Dopplers which did not show any DVT.  Adrenal adenoma Outpatient management.  Seronegative rheumatoid arthritis Followed by rheumatology.  Has been on methotrexate.  Currently on hold.  Apparently she does not have a history of lupus.  Hyponatremia Stable for the most part.  Hypokalemia Repleted  Normocytic anemia No evidence of overt bleeding.  Diabetes mellitus type 2 Recent HbA1c was 13.5.  Repeat A1c here is 7.5.  Monitor CBGs.  Thrombocytosis Resolved.  Likely reactive.  DVT Prophylaxis: Subcutaneous heparin Code Status: Full code Family Communication: Discussed with the  patient Disposition Plan: Hopefully return home when improved  Status is: Inpatient Remains inpatient appropriate because: Acute kidney injury      Medications: Scheduled:  atorvastatin  40 mg Oral Daily   diclofenac Sodium  2 g Topical QID   folic acid  1 mg Oral Daily   heparin  5,000 Units Subcutaneous Q8H   metolazone  5 mg Oral Daily   multivitamin with minerals  1 tablet Oral Daily   pantoprazole  40 mg Oral Daily   Continuous:  furosemide Stopped (03/23/22 1400)   BJY:[id number] **OR** acetaminophen, fentaNYL (SUBLIMAZE) injection, hydrALAZINE, ondansetron **OR** ondansetron (ZOFRAN) IV, mouth rinse, oxyCODONE  Antibiotics: Anti-infectives (From admission, onward)    None       Objective:  Vital Signs  Vitals:   03/23/22 1135 03/23/22 1200 03/23/22 2110 03/24/22 0457  BP: 101/70 92/62 (!) 101/59 (!) 94/51  Pulse: 96 93 94 90  Resp: '16 14 19 19  '$ Temp:  98.1 F (36.7 C) 98.1 F (36.7 C) 98 F (36.7 C)  TempSrc:      SpO2: 100% 93% 100% 97%  Weight:    59.5 kg  Height:        Intake/Output Summary (Last 24 hours) at 03/24/2022 1050 Last data filed at 03/24/2022 0900 Gross per 24 hour  Intake 656 ml  Output 750 ml  Net -94 ml    Filed Weights   03/20/22 0500 03/21/22 0500 03/24/22 0457  Weight: 66.7 kg 60.3 kg 59.5 kg    General appearance: Awake alert.  In no distress Resp: Clear to auscultation bilaterally.  Normal effort Cardio: S1-S2 is normal regular.  No S3-S4.  No rubs murmurs or bruit GI: Abdomen is soft.  Nontender nondistended.  Bowel sounds are present normal.  No masses organomegaly Extremities: Edema noted lower extremities as well as in the left upper extremity Neurologic: Alert and oriented x3.  No focal neurological deficits.    Lab Results:  Data Reviewed: I have personally reviewed following labs and reports of the imaging studies  CBC: Recent Labs  Lab 03/18/22 0528 03/20/22 0556 03/23/22 0559 03/24/22 0528  WBC  5.5 4.4 4.3 9.5  NEUTROABS 4.0  --   --   --   HGB 11.7* 11.6* 11.9* 11.9*  HCT 34.4* 33.5* 35.4* 34.5*  MCV 95.3 94.1 96.2 94.3  PLT 363 385 362 309     Basic Metabolic Panel: Recent Labs  Lab 03/18/22 0528 03/19/22 0750 03/20/22 0556 03/21/22 0502 03/22/22 0625 03/23/22 0559 03/24/22 0528  NA 136 137 135 133* 133* 132* 132*  K 3.9 3.9 3.3* 3.9 3.7 3.2* 3.7  CL 99 98 95* 95* 92* 92* 89*  CO2 '24 30 28 26 28 27 '$ 32  GLUCOSE 108* 129* 116* 110* 110* 131* 114*  BUN 41* 47* 41* 44* 49* 51* 50*  CREATININE 3.93* 3.68* 2.79* 2.78* 2.77* 2.68* 2.69*  CALCIUM 9.2 8.7* 8.9 8.5* 8.6* 8.2* 8.4*  MG 2.0  --   --   --   --   --   --   PHOS 5.8* 5.7* 5.0* 4.6  --   --   --      GFR: Estimated Creatinine Clearance: 17.4 mL/min (A) (by C-G formula based on SCr of 2.69 mg/dL (H)).  Liver Function Tests: Recent Labs  Lab 03/18/22 0528 03/19/22  3790 03/20/22 0556 03/21/22 0502  AST 21  --   --   --   ALT 18  --   --   --   ALKPHOS 45  --   --   --   BILITOT 0.8  --   --   --   PROT 6.0*  --   --   --   ALBUMIN 3.4* 3.0* 3.2* 2.9*     No results for input(s): "LIPASE", "AMYLASE" in the last 168 hours.   Coagulation Profile: Recent Labs  Lab 03/23/22 0559  INR 1.0      CBG: Recent Labs  Lab 03/19/22 0736 03/21/22 0833 03/22/22 0826 03/23/22 0741 03/24/22 0744  GLUCAP 144* 124* 86 113* 105*      Radiology Studies: CT RENAL BIOPSY  Result Date: 03/23/2022 INDICATION: Unexplained acute kidney injury, hypertension, diabetes, and lupus. EXAM: CT GUIDED CORE BIOPSY OF THE LEFT KIDNEY LOWER POLE CORTEX TECHNIQUE: Multidetector CT imaging of the MID ABDOMEN was performed following the standard protocol WITHOUT IV contrast. RADIATION DOSE REDUCTION: This exam was performed according to the departmental dose-optimization program which includes automated exposure control, adjustment of the mA and/or kV according to patient size and/or use of iterative reconstruction  technique. MEDICATIONS: 1% LIDOCAINE LOCAL ANESTHESIA/SEDATION: Moderate (conscious) sedation was employed during this procedure. A total of Versed 0.5 mg and Fentanyl 125 mcg was administered intravenously by the radiology nurse. Total intra-service moderate Sedation Time: 15 minutes. The patient's level of consciousness and vital signs were monitored continuously by radiology nursing throughout the procedure under my direct supervision. COMPLICATIONS: None immediate. PROCEDURE: Informed written consent was obtained from the patient after a thorough discussion of the procedural risks, benefits and alternatives. All questions were addressed. Maximal Sterile Barrier Technique was utilized including caps, mask, sterile gowns, sterile gloves, sterile drape, hand hygiene and skin antiseptic. A timeout was performed prior to the initiation of the procedure. previous imaging reviewed. patient position prone. noncontrast localization CT performed. the left kidney lower pole cortex was localized and marked for a posterolateral approach. Under sterile conditions local anesthesia, CT guidance was utilized to advance the 17 gauge 11.8 cm guide needle to the left kidney lower pole. Needle position confirmed with a CT. 2 18 gauge core biopsies obtained, 1 cm in length and a second 2 cm in length. These were placed in saline. Samples were solid and intact. Post procedure imaging demonstrates no hemorrhage or hematoma. Patient tolerated biopsy well. IMPRESSION: Successful CT-guided left kidney lower pole cortex core biopsy Electronically Signed   By: Jerilynn Mages.  Shick M.D.   On: 03/23/2022 12:49   VAS [place] UPPER EXTREMITY VENOUS DUPLEX  Result Date: 03/22/2022 UPPER VENOUS STUDY  Patient Name:  Alyssa Rhodes  Date of Exam:   03/22/2022 Medical Rec #: [id number]     Accession #:    [id number] Date of Birth: [birth year]     Patient Gender: F Patient Age:   25 years Exam Location:  [facility] Procedure:      VAS [place] UPPER EXTREMITY  VENOUS DUPLEX Referring Phys: Otelia Santee --------------------------------------------------------------------------------  Indications: Swelling Risk Factors: None identified. Comparison Study: No prior studies. Performing Technologist: Oliver Hum RVT  Examination Guidelines: A complete evaluation includes B-mode imaging, spectral Doppler, color Doppler, and power Doppler as needed of all accessible portions of each vessel. Bilateral testing is considered an integral part of a complete examination. Limited examinations for reoccurring indications may be performed as noted.  Right Findings: +----------+------------+---------+-----------+----------+-------+ RIGHT  CompressiblePhasicitySpontaneousPropertiesSummary +----------+------------+---------+-----------+----------+-------+ Subclavian    Full       Yes       Yes                      +----------+------------+---------+-----------+----------+-------+  Left Findings: +----------+------------+---------+-----------+----------+-------+ LEFT      CompressiblePhasicitySpontaneousPropertiesSummary +----------+------------+---------+-----------+----------+-------+ IJV           Full       Yes       Yes                      +----------+------------+---------+-----------+----------+-------+ Subclavian    Full       Yes       Yes                      +----------+------------+---------+-----------+----------+-------+ Axillary      Full       Yes       Yes                      +----------+------------+---------+-----------+----------+-------+ Brachial      Full       Yes       Yes                      +----------+------------+---------+-----------+----------+-------+ Radial        Full                                          +----------+------------+---------+-----------+----------+-------+ Ulnar         Full                                           +----------+------------+---------+-----------+----------+-------+ Cephalic      Full                                          +----------+------------+---------+-----------+----------+-------+ Basilic       Full                                          +----------+------------+---------+-----------+----------+-------+  Summary:  Right: No evidence of thrombosis in the subclavian.  Left: No evidence of deep vein thrombosis in the upper extremity. No evidence of superficial vein thrombosis in the upper extremity.  *See table(s) above for measurements and observations.  Diagnosing physician: Deitra Mayo MD Electronically signed by Deitra Mayo MD on 03/22/2022 at 4:57:13 PM.    Final        LOS: 6 days   [facility] Pager on [email or web address]  03/24/2022, 10:50 AM

## 2022-03-25 DIAGNOSIS — R601 Generalized edema: Secondary | ICD-10-CM | POA: Diagnosis not present

## 2022-03-25 DIAGNOSIS — N179 Acute kidney failure, unspecified: Secondary | ICD-10-CM | POA: Diagnosis not present

## 2022-03-25 LAB — BASIC METABOLIC PANEL
Anion gap: 11 (ref 5–15)
BUN: 49 mg/dL — ABNORMAL HIGH (ref 8–23)
CO2: 33 mmol/L — ABNORMAL HIGH (ref 22–32)
Calcium: 8.3 mg/dL — ABNORMAL LOW (ref 8.9–10.3)
Chloride: 88 mmol/L — ABNORMAL LOW (ref 98–111)
Creatinine, Ser: 2.41 mg/dL — ABNORMAL HIGH (ref 0.44–1.00)
GFR, Estimated: 22 mL/min — ABNORMAL LOW (ref >60)
Glucose, Bld: 134 mg/dL — ABNORMAL HIGH (ref 70–99)
Potassium: 3.6 mmol/L (ref 3.5–5.1)
Sodium: 132 mmol/L — ABNORMAL LOW (ref 135–145)

## 2022-03-25 LAB — SEDIMENTATION RATE: Sed Rate: 30 mm/hr — ABNORMAL HIGH (ref 0–22)

## 2022-03-25 LAB — C-REACTIVE PROTEIN: CRP: 1.2 mg/dL — ABNORMAL HIGH (ref <1.0)

## 2022-03-25 LAB — GLUCOSE, CAPILLARY: Glucose-Capillary: 127 mg/dL — ABNORMAL HIGH (ref 70–99)

## 2022-03-25 MED ORDER — GABAPENTIN 300 MG PO CAPS
300.0000 mg | ORAL_CAPSULE | Freq: Every day | ORAL | Status: DC
  Administered 2022-03-25 – 2022-03-28 (×4): 300 mg via ORAL
  Filled 2022-03-25 (×4): qty 1

## 2022-03-25 MED ORDER — SENNOSIDES-DOCUSATE SODIUM 8.6-50 MG PO TABS
2.0000 | ORAL_TABLET | Freq: Two times a day (BID) | ORAL | Status: DC
  Administered 2022-03-25 – 2022-03-28 (×7): 2 via ORAL
  Filled 2022-03-25 (×7): qty 2

## 2022-03-25 MED ORDER — POLYETHYLENE GLYCOL 3350 17 G PO PACK
17.0000 g | PACK | Freq: Every day | ORAL | Status: DC
  Administered 2022-03-25 – 2022-03-28 (×4): 17 g via ORAL
  Filled 2022-03-25 (×4): qty 1

## 2022-03-25 NOTE — Progress Notes (Signed)
[facility] Progress Note    Home meds: atrovastatin, estradiol, methotrexate '10mg'$  q wed, MVI, olmesartan 40 qd, buproprion, chlorthalidone 25 qd, furosemide 40 qd      Renal US - 10.6/ 9.6 cm kidneys w/o hydro, normal bladder      Jan 2017 - ANA +speckled, titer >= 1:1280       Jan 2017 - RF = +46 (15-41 = low, >= 42 is high)       HIV neg, hep B/C negative, UP/C ratio = 0.22         Assessment/ Plan Acute kidney injury - suspect hemodynamic in setting of anasarca, decreased EABV, renal hypoperfusion/ NSAID use + ARB effect.  Other potential dx's are autoimmune, paraproteinemia.  Albumin dropped to mid 3s in last 2 mos but no sig proteinuria, so doesn't look like neph syndrome. Have ordered complements, myeloma labs and some serologies, all pending. Cont to hold ARB and nsaids. Started IV lasix 80 bid and did not respond, but is now doing better w/ ^120 mg IV tid + zaroxolyn daily. C3C4 NL, HBV, HCV, HIV, ANCA, GBM all neg. K/L elevated which could be seen w/ renal failure. [place] WBC's in urine but 0-5 RBC's w/ rare bact. UPC only 0.22.  ANA pos. Appreciate VIR performing the biopsy on 8/30, no e/o bleeding.  - Edema/ pain starting to improve and creat improving and initially holding off on kidney biopsy given inactive urine, minimal proteinuria. But pt was not feeling well, renal function not improving as much as I would like and patient wanted an answer. Decided to bx in case prednisone for AIN is an option -> no results back yet.  Lasix had been held for hypotension but I restarted on 8/31 @ '80mg'$  BID + stopped the Metolazone. Still significant pos sodium balance. Ted hose to help mobilize fluid (has not put them back on).   She is voiding more now that Lasix is back on, will hold off on further Metolazone unless uop is poor as her BP is on the lower side. Don't want to prolonged kidney injury. Cr already starting to improve. No bx results yet and headed into a 3 day weekend may  not have answers till next week. If she is stable for d/c we can always see her in clinic to follow up + give results of the biopsy.  - Avoid all nsaids in the future. May have AIN; she's already starting to improve and no steroids for now.    Anasarca: Likely associated with acute kidney injury and sodium retention effect of heavy NSAIDs with ongoing ARB use.  Starting to diurese as above. - LUE duplex r/o DVT (LUE >>>RUE which is new for her)  Epigastric pain: possible gastritis associated with NSAID use.  Medical management per primary service with H2 RA +/- Carafate (the latter for maximum 72 hours use in setting of acute kidney injury to limit aluminum toxicity). Hypertension: no ARB/ acei for this pt w/ AKI. BP's soft, not on any BP lowering meds.  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 She had been on meloxicam at some point in the past.    Subjective: 900 cc UOP yesterday, BP on low side. Creat 2.4 today. Pt waxes and wanes, feeling better than yest. Legs are not as swollen and not as painful.   Vitals:   03/24/22 1532 03/24/22  2033 03/25/22 0556 03/25/22 0941  BP: (!) 102/59 103/63 91/66 112/72  Pulse: 92 95 86 96  Resp:  19 20   Temp:  98.8 F (37.1 C) 98 F (36.7 C)   TempSrc:   Oral   SpO2:  95% 98%   Weight:   57.2 kg   Height:        Exam: Gen no distress, looks more comfortable today No JVD Chest  no rales or wheezing Cor reg no RG ABd soft, distended, mild tenderness in epigastric area Ext 1+ bilat thigh edema, improving, LUE >>>RUE Neuro Ox 3, nonfocal    Recent Labs  Lab 03/20/22 0556 03/21/22 0502 03/22/22 0625 03/23/22 0559 03/24/22 0528 03/25/22 0604  HGB 11.6*  --   --  11.9* 11.9*  --   ALBUMIN 3.2* 2.9*  --   --   --   --   CALCIUM 8.9 8.5*   < > 8.2* 8.4* 8.3*  PHOS 5.0* 4.6  --   --   --   --   CREATININE  2.79* 2.78*   < > 2.68* 2.69* 2.41*  K 3.3* 3.9   < > 3.2* 3.7 3.6   < > = values in this interval not displayed.   No results for input(s): "IRON", "TIBC", "FERRITIN" in the last 168 hours. Inpatient medications:  atorvastatin  40 mg Oral Daily   diclofenac Sodium  2 g Topical QID   folic acid  1 mg Oral Daily   furosemide  80 mg Intravenous BID   heparin  5,000 Units Subcutaneous Q8H   multivitamin with minerals  1 tablet Oral Daily   pantoprazole  40 mg Oral Daily   polyethylene glycol  17 g Oral Daily   senna-docusate  2 tablet Oral BID     acetaminophen **OR** acetaminophen, fentaNYL (SUBLIMAZE) injection, hydrALAZINE, ondansetron **OR** ondansetron (ZOFRAN) IV, mouth rinse, oxyCODONE

## 2022-03-25 NOTE — Progress Notes (Signed)
[facility] PROGRESS NOTE   RHYLAN GROSS OIZ:[id number] DOB: 12-11-1960 DOA: 03/17/2022  PCP: Doretha Sou, MD  Brief History/Interval Summary: 61 y.o. female with medical history significant for but not limited to hypertension, uncontrolled diabetes mellitus type 2 with recent hemoglobin A1c of 13.5, dyslipidemia, history of seronegative rheumatoid arthritis,  who presented with swelling of her left upper extremity and bilateral lower extremities and abdomen with epigastric abdominal discomfort and pain that has been going on for several months now.  Patient has a normal baseline creatinine of 0.7-0.9 and states that she has had worsening swelling and pain of her extremities as well as abdomen.  States that she vomited twice and has not really been able to eat anything given her gastric distention and epigastric pain.  She has been taking ibuprofen at least 2-3 times a day due to her pain and has been having some abdominal discomfort and diarrhea with metformin which she stopped because of this and subsequently states that her blood sugars elevated.  Given her lower extremity swelling she was seen by cardiology in outpatient setting who did an echocardiogram which showed a preserved ejection fracture with mild pericardial effusion.  She was initiated on furosemide but did not take this on a consistent basis.  She has been having worsening lower extremity edema and has been following Dr. Amil Amen for rheumatology care and had another medication that she was taken for her lupus.  She notes that she has had worsening abdominal discomfort and distention as well as lower extremity swelling and edema with no appreciable rashes.  She denies any other concerns or complaints at this time.  Given her persistence in her symptoms and lower extremity pain and abdominal pain she presented to the ED for further evaluation.  [facility] hospital[facility] was consulted for further evaluation and will admit this  patient for AKI with bilateral lower extremity edema and anasarca.   Consultants: Nephrology  Procedures: Renal biopsy 8/30   Subjective/Interval History: Feels fatigued this morning.  No other complaints offered.  Did make more urine yesterday compared to the day before.  Denies any abdominal pain nausea or vomiting.     Assessment/Plan:  Acute kidney injury This is in the setting of NSAID use along with other nephrotoxic medications including Benicar and furosemide and chlorthalidone.  Baseline creatinine 0.7-0.9.  Presented with creatinine of 4.0.  Has improved and is noted to be 2.69.  Appears to have plateaued. Renal ultrasound did not show any hydronephrosis. Nephrology was consulted.  Patient underwent renal biopsy on 8/30. Furosemide was ordered however could not be given consistently due to hypotension.  It was subsequently held.  Resumed at a lower dose by nephrology yesterday.  Metolazone has been discontinued.  Urine output appears to have picked up.  Creatinine noted to be slightly better today.  Anasarca with pleural effusions and pericardial effusion Likely secondary to renal failure.   Echocardiogram showed normal systolic function.  Echo also showed small pericardial effusion. She underwent upper and lower extremity venous Dopplers which did not show any DVT. Back on furosemide.  Adrenal adenoma Outpatient management.  Seronegative rheumatoid arthritis Followed by rheumatology.  Has been on methotrexate.  Currently on hold.  Apparently she does not have a history of lupus.  Hyponatremia Stable for the most part.  Hypokalemia Repleted  Normocytic anemia No evidence of overt bleeding.  Diabetes mellitus type 2 Recent HbA1c was 13.5.  Repeat A1c here is 7.5.  Monitor CBGs.  Thrombocytosis Resolved.  Likely reactive.  DVT Prophylaxis: Subcutaneous heparin Code Status: Full code Family Communication: Discussed with the patient Disposition Plan: Hopefully  return home when improved  Status is: Inpatient Remains inpatient appropriate because: Acute kidney injury      Medications: Scheduled:  atorvastatin  40 mg Oral Daily   diclofenac Sodium  2 g Topical QID   folic acid  1 mg Oral Daily   furosemide  80 mg Intravenous BID   heparin  5,000 Units Subcutaneous Q8H   multivitamin with minerals  1 tablet Oral Daily   pantoprazole  40 mg Oral Daily   polyethylene glycol  17 g Oral Daily   senna-docusate  2 tablet Oral BID   Continuous:   IDP:OEUMPNTIRWERX **OR** acetaminophen, fentaNYL (SUBLIMAZE) injection, hydrALAZINE, ondansetron **OR** ondansetron (ZOFRAN) IV, mouth rinse, oxyCODONE  Antibiotics: Anti-infectives (From admission, onward)    None       Objective:  Vital Signs  Vitals:   03/24/22 1532 03/24/22 2033 03/25/22 0556 03/25/22 0941  BP: (!) 102/59 103/63 91/66 112/72  Pulse: 92 95 86 96  Resp:  19 20   Temp:  98.8 F (37.1 C) 98 F (36.7 C)   TempSrc:   Oral   SpO2:  95% 98%   Weight:   57.2 kg   Height:        Intake/Output Summary (Last 24 hours) at 03/25/2022 1055 Last data filed at 03/24/2022 2231 Gross per 24 hour  Intake --  Output 650 ml  Net -650 ml    Filed Weights   03/21/22 0500 03/24/22 0457 03/25/22 0556  Weight: 60.3 kg 59.5 kg 57.2 kg    General appearance: Awake alert.  In no distress Resp: Clear to auscultation bilaterally.  Normal effort Cardio: S1-S2 is normal regular.  No S3-S4.  No rubs murmurs or bruit GI: Abdomen is soft.  Nontender nondistended.  Bowel sounds are present normal.  No masses organomegaly Extremities: Edema noted in upper and lower extremities Neurologic: Alert and oriented x3.  No focal neurological deficits.    Lab Results:  Data Reviewed: I have personally reviewed following labs and reports of the imaging studies  CBC: Recent Labs  Lab 03/20/22 0556 03/23/22 0559 03/24/22 0528  WBC 4.4 4.3 9.5  HGB 11.6* 11.9* 11.9*  HCT 33.5* 35.4* 34.5*   MCV 94.1 96.2 94.3  PLT 385 362 309     Basic Metabolic Panel: Recent Labs  Lab 03/19/22 0750 03/20/22 0556 03/21/22 0502 03/22/22 0625 03/23/22 0559 03/24/22 0528 03/25/22 0604  NA 137 135 133* 133* 132* 132* 132*  K 3.9 3.3* 3.9 3.7 3.2* 3.7 3.6  CL 98 95* 95* 92* 92* 89* 88*  CO2 '30 28 26 28 27 '$ 32 33*  GLUCOSE 129* 116* 110* 110* 131* 114* 134*  BUN 47* 41* 44* 49* 51* 50* 49*  CREATININE 3.68* 2.79* 2.78* 2.77* 2.68* 2.69* 2.41*  CALCIUM 8.7* 8.9 8.5* 8.6* 8.2* 8.4* 8.3*  PHOS 5.7* 5.0* 4.6  --   --   --   --      GFR: Estimated Creatinine Clearance: 19.4 mL/min (A) (by C-G formula based on SCr of 2.41 mg/dL (H)).  Liver Function Tests: Recent Labs  Lab 03/19/22 0750 03/20/22 0556 03/21/22 0502  ALBUMIN 3.0* 3.2* 2.9*      Coagulation Profile: Recent Labs  Lab 03/23/22 0559  INR 1.0      CBG: Recent Labs  Lab 03/21/22 0833 03/22/22 0826 03/23/22 0741 03/24/22 0744 03/25/22 0754  GLUCAP 124* 86 113* 105* 127*  Radiology Studies: CT RENAL BIOPSY  Result Date: 03/23/2022 INDICATION: Unexplained acute kidney injury, hypertension, diabetes, and lupus. EXAM: CT GUIDED CORE BIOPSY OF THE LEFT KIDNEY LOWER POLE CORTEX TECHNIQUE: Multidetector CT imaging of the MID ABDOMEN was performed following the standard protocol WITHOUT IV contrast. RADIATION DOSE REDUCTION: This exam was performed according to the departmental dose-optimization program which includes automated exposure control, adjustment of the mA and/or kV according to patient size and/or use of iterative reconstruction technique. MEDICATIONS: 1% LIDOCAINE LOCAL ANESTHESIA/SEDATION: Moderate (conscious) sedation was employed during this procedure. A total of Versed 0.5 mg and Fentanyl 125 mcg was administered intravenously by the radiology nurse. Total intra-service moderate Sedation Time: 15 minutes. The patient's level of consciousness and vital signs were monitored continuously by  radiology nursing throughout the procedure under my direct supervision. COMPLICATIONS: None immediate. PROCEDURE: Informed written consent was obtained from the patient after a thorough discussion of the procedural risks, benefits and alternatives. All questions were addressed. Maximal Sterile Barrier Technique was utilized including caps, mask, sterile gowns, sterile gloves, sterile drape, hand hygiene and skin antiseptic. A timeout was performed prior to the initiation of the procedure. previous imaging reviewed. patient position prone. noncontrast localization CT performed. the left kidney lower pole cortex was localized and marked for a posterolateral approach. Under sterile conditions local anesthesia, CT guidance was utilized to advance the 17 gauge 11.8 cm guide needle to the left kidney lower pole. Needle position confirmed with a CT. 2 18 gauge core biopsies obtained, 1 cm in length and a second 2 cm in length. These were placed in saline. Samples were solid and intact. Post procedure imaging demonstrates no hemorrhage or hematoma. Patient tolerated biopsy well. IMPRESSION: Successful CT-guided left kidney lower pole cortex core biopsy Electronically Signed   By: Jerilynn Mages.  Shick M.D.   On: 03/23/2022 12:49       LOS: 7 days   [facility] Pager on [email or web address]  03/25/2022, 10:55 AM

## 2022-03-25 NOTE — Progress Notes (Signed)
Mobility Specialist - Progress Note   03/25/22 0942  Mobility  Activity Transferred from bed to chair  Level of Assistance Moderate assist, patient does 50-74%  Assistive Device Front wheel walker  Distance Ambulated (ft) 5 ft  Activity Response Tolerated fair  $Mobility charge 1 Mobility   Pt received in bed and agreed for mobility. Lots of pain in both LE, 10/10 during sit to stand. Pt cried due to pain, tried marching in place but steps weren't high. Pt then sat back down for a break then transferred +2 to chair. Call bell within reach, all needs met for pt with RN in room.   Roderick Pee Mobility Specialist

## 2022-03-25 NOTE — Progress Notes (Signed)
Physical Therapy Treatment Patient Details Name: Alyssa Rhodes MRN: [id number] DOB: 10-May-1961 Today's Date: 03/25/2022   History of Present Illness Pt is a 61 yo female admitted with swelling of BLEs and LUE and abdomen for last month.  Pt found to have AKI and pleural and pericardial effusions. PMH: HTN, DM, RA, lupus.    PT Comments    Pt NOT progressing due to increased c/o B LE pain which pt started a couple of months ago "but nobody seems to know why" stated pt.  "I have been back and forth to the Doctors office".  Pt in tears as she was truly trying to walk for me.  General Gait Details: Unable to amb past 2 feet due to 10/10 B LE pain.  Pt was given 5 mg OXY 40 min prior and still was not able to functionally walk.  Sent a secure chat to attending MD. Pt was evaluated by LPT on 03/18/22 with rec to D/C back home with SON however due to extended length of stay and a decline in her mobility, pt will need SNF.  Will consult LPT.   Recommendations for follow up therapy are one component of a multi-disciplinary discharge planning process, led by the attending physician.  Recommendations may be updated based on patient status, additional functional criteria and insurance authorization.  Follow Up Recommendations  Skilled nursing-short term rehab (<3 hours/day)     Assistance Recommended at Discharge Set up Supervision/Assistance  Patient can return home with the following Two people to help with walking and/or transfers;A lot of help with walking and/or transfers   Equipment Recommendations  Rolling walker (2 wheels)    Recommendations for Other Services       Precautions / Restrictions Precautions Precautions: Fall Precaution Comments: increased progressive B LE pain Restrictions Weight Bearing Restrictions: No     Mobility  Bed Mobility               General bed mobility comments: OOB in recliner    Transfers Overall transfer level: Needs assistance Equipment used:  Rolling walker (2 wheels) Transfers: Sit to/from Stand Sit to Stand: Max assist Stand pivot transfers: Max assist         General transfer comment: VERY difficult to self rise and lower due to 10/10 B LE pain "whole leg" stated pt.    Ambulation/Gait Ambulation/Gait assistance: Max assist Gait Distance (Feet): 2 Feet Assistive device: Rolling walker (2 wheels) Gait Pattern/deviations: Step-to pattern, Decreased step length - left, Decreased step length - right, Trunk flexed, Narrow base of support Gait velocity: decreased     General Gait Details: Unable to amb past 2 feet due to 10/10 B LE pain.  Pt was given 5 mg OXY 40 min prior and still was not able to functionally walk. Pt crying with pain.   Stairs             Wheelchair Mobility    Modified Rankin (Stroke Patients Only)       Balance                                            Cognition Arousal/Alertness: Awake/alert Behavior During Therapy: WFL for tasks assessed/performed Overall Cognitive Status: Within Functional Limits for tasks assessed  General Comments: AxO x 3 very 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  Stated her pain started "months ago" and has been back and forth between MD's.  Pt lives with her son but has nobody that can be with her all the time.        Exercises      General Comments        Pertinent Vitals/Pain Pain Assessment Pain Assessment: 0-10 Pain Score: 10-Worst pain ever Pain Location: B LE from thighs to feet "deep", "tight", pt crying with pain.  Sent a secure chat to MD. Pain Descriptors / Indicators: Tightness, Grimacing, Crying Pain Intervention(s): Limited activity within patient's tolerance, Repositioned    Home Living                          Prior Function            PT Goals (current goals can now be found in the care plan section) Progress towards PT goals: Progressing  toward goals    Frequency    Min 3X/week      PT Plan Current plan remains appropriate    Co-evaluation              AM-PAC PT "6 Clicks" Mobility   Outcome Measure  Help needed turning from your back to your side while in a flat bed without using bedrails?: A Lot Help needed moving from lying on your back to sitting on the side of a flat bed without using bedrails?: A Lot Help needed moving to and from a bed to a chair (including a wheelchair)?: A Lot Help needed standing up from a chair using your arms (e.g., wheelchair or bedside chair)?: A Lot Help needed to walk in hospital room?: A Lot Help needed climbing 3-5 steps with a railing? : Total 6 Click Score: 11    End of Session Equipment Utilized During Treatment: Gait belt Activity Tolerance: Patient limited by pain Patient left: with call bell/phone within reach;with bed alarm set;in chair Nurse Communication: Mobility status PT Visit Diagnosis: Other abnormalities of gait and mobility (R26.89)     Time: 9179-1505 PT Time Calculation (min) (ACUTE ONLY): 25 min  Charges:  $Gait Training: 8-22 mins $Therapeutic Activity: 8-22 mins                     {Ellwyn Ergle  PTA Acute  [employer] M-F          [phone] Weekend pager [phone]

## 2022-03-26 DIAGNOSIS — N179 Acute kidney failure, unspecified: Secondary | ICD-10-CM | POA: Diagnosis not present

## 2022-03-26 DIAGNOSIS — R601 Generalized edema: Secondary | ICD-10-CM | POA: Diagnosis not present

## 2022-03-26 DIAGNOSIS — M79605 Pain in left leg: Secondary | ICD-10-CM

## 2022-03-26 DIAGNOSIS — M79604 Pain in right leg: Secondary | ICD-10-CM | POA: Diagnosis not present

## 2022-03-26 LAB — BASIC METABOLIC PANEL
Anion gap: 13 (ref 5–15)
BUN: 52 mg/dL — ABNORMAL HIGH (ref 8–23)
CO2: 36 mmol/L — ABNORMAL HIGH (ref 22–32)
Calcium: 9.1 mg/dL (ref 8.9–10.3)
Chloride: 87 mmol/L — ABNORMAL LOW (ref 98–111)
Creatinine, Ser: 2.74 mg/dL — ABNORMAL HIGH (ref 0.44–1.00)
GFR, Estimated: 19 mL/min — ABNORMAL LOW (ref >60)
Glucose, Bld: 137 mg/dL — ABNORMAL HIGH (ref 70–99)
Potassium: 3.8 mmol/L (ref 3.5–5.1)
Sodium: 136 mmol/L (ref 135–145)

## 2022-03-26 LAB — GLUCOSE, CAPILLARY: Glucose-Capillary: 136 mg/dL — ABNORMAL HIGH (ref 70–99)

## 2022-03-26 MED ORDER — PREDNISONE 20 MG PO TABS
20.0000 mg | ORAL_TABLET | Freq: Two times a day (BID) | ORAL | Status: DC
  Administered 2022-03-26 – 2022-03-28 (×5): 20 mg via ORAL
  Filled 2022-03-26 (×5): qty 1

## 2022-03-26 NOTE — Plan of Care (Signed)

## 2022-03-26 NOTE — Progress Notes (Signed)
[facility] PROGRESS NOTE   Alyssa Rhodes MCN:[id number] DOB: 12-Apr-1961 DOA: 03/17/2022  PCP: Doretha Sou, MD  Brief History/Interval Summary: 61 y.o. female with medical history significant for but not limited to hypertension, uncontrolled diabetes mellitus type 2 with recent hemoglobin A1c of 13.5, dyslipidemia, history of seronegative rheumatoid arthritis,  who presented with swelling of her left upper extremity and bilateral lower extremities and abdomen with epigastric abdominal discomfort and pain that has been going on for several months now.  Patient has a normal baseline creatinine of 0.7-0.9 and states that she has had worsening swelling and pain of her extremities as well as abdomen.  States that she vomited twice and has not really been able to eat anything given her gastric distention and epigastric pain.  She has been taking ibuprofen at least 2-3 times a day due to her pain and has been having some abdominal discomfort and diarrhea with metformin which she stopped because of this and subsequently states that her blood sugars elevated.  Given her lower extremity swelling she was seen by cardiology in outpatient setting who did an echocardiogram which showed a preserved ejection fracture with mild pericardial effusion.  She was initiated on furosemide but did not take this on a consistent basis.  She has been having worsening lower extremity edema and has been following Dr. Amil Amen for rheumatology care and had another medication that she was taken for her lupus.  She notes that she has had worsening abdominal discomfort and distention as well as lower extremity swelling and edema with no appreciable rashes.  She denies any other concerns or complaints at this time.  Given her persistence in her symptoms and lower extremity pain and abdominal pain she presented to the ED for further evaluation.  She was hospitalized for further management.  Consultants:  Nephrology  Procedures: Renal biopsy 8/30   Subjective/Interval History: Continues to feel fatigued.  Complains of pain in her right ankle and foot.  The pain in both legs is improved.  She was able to ambulate with a walker last night.  Denies any back pain.     Assessment/Plan:  Acute kidney injury This is in the setting of NSAID use along with other nephrotoxic medications including Benicar and furosemide and chlorthalidone.  Baseline creatinine 0.7-0.9.  Presented with creatinine of 4.0.   Renal ultrasound did not show any hydronephrosis. Nephrology was consulted.  Patient underwent renal biopsy on 8/30. Furosemide was ordered however could not be given consistently due to hypotension.  It was subsequently held.  Subsequently resumed at a lower dose.  Metolazone was discontinued. Urine output has picked up but creatinine noted to be slightly higher today at 2.7 compared to yesterday.  Waiting on pathology report.  Anasarca with pleural effusions and pericardial effusion Likely secondary to renal failure.   Echocardiogram showed normal systolic function.  Echo also showed small pericardial effusion. She underwent upper and lower extremity venous Dopplers which did not show any DVT. Back on furosemide.  Bilateral lower extremity pain Apparently patient has been having bilateral lower extremity pain for 2 months.  The pain is mainly from the knee down.  Both legs.  Today she is complaining of pain in the right ankle and foot.  She was started on gabapentin yesterday due to concern for neuropathic pain.  Pain appears to have improved.  She does not have motor deficits per se. No low back pain.  No clear indication to do MRI of the spine at this time.  She does have a history of rheumatoid arthritis and could be experiencing a flare.  We will give her steroids and look for response.  Adrenal adenoma Outpatient management.  Seronegative rheumatoid arthritis Followed by rheumatology.  Has  been on methotrexate.  Currently on hold.  Apparently she does not have a history of lupus.  Hyponatremia Improved  Hypokalemia Repleted  Normocytic anemia No evidence of overt bleeding.  Hemoglobin has been stable.  Diabetes mellitus type 2 Recent HbA1c was 13.5.  Repeat A1c here is 7.5.  Monitor CBGs.  Thrombocytosis Resolved.  Likely reactive.  DVT Prophylaxis: Subcutaneous heparin Code Status: Full code Family Communication: Discussed with the patient Disposition Plan: SNF recommended by PT.  Status is: Inpatient Remains inpatient appropriate because: Acute kidney injury      Medications: Scheduled:  atorvastatin  40 mg Oral Daily   diclofenac Sodium  2 g Topical QID   folic acid  1 mg Oral Daily   furosemide  80 mg Intravenous BID   gabapentin  300 mg Oral Daily   heparin  5,000 Units Subcutaneous Q8H   multivitamin with minerals  1 tablet Oral Daily   pantoprazole  40 mg Oral Daily   polyethylene glycol  17 g Oral Daily   predniSONE  20 mg Oral BID WC   senna-docusate  2 tablet Oral BID   Continuous:   PIR:[id number] **OR** acetaminophen, fentaNYL (SUBLIMAZE) injection, hydrALAZINE, ondansetron **OR** ondansetron (ZOFRAN) IV, mouth rinse, oxyCODONE  Antibiotics: Anti-infectives (From admission, onward)    None       Objective:  Vital Signs  Vitals:   03/25/22 1358 03/25/22 2101 03/26/22 0500 03/26/22 0535  BP: 99/62 105/64  95/64  Pulse: 100 97  96  Resp: '16 18  18  '$ Temp: 98.5 F (36.9 C) 98.2 F (36.8 C)  97.6 F (36.4 C)  TempSrc: Oral Oral  Oral  SpO2: 98% 91%  96%  Weight:   60.8 kg   Height:        Intake/Output Summary (Last 24 hours) at 03/26/2022 0948 Last data filed at 03/25/2022 1857 Gross per 24 hour  Intake 709 ml  Output 775 ml  Net -66 ml    Filed Weights   03/24/22 0457 03/25/22 0556 03/26/22 0500  Weight: 59.5 kg 57.2 kg 60.8 kg    General appearance: Awake alert.  In no distress Resp: Clear to auscultation  bilaterally.  Normal effort Cardio: S1-S2 is normal regular.  No S3-S4.  No rubs murmurs or bruit GI: Abdomen is soft.  Nontender nondistended.  Bowel sounds are present normal.  No masses organomegaly Extremities: No bilateral lower and upper extremities.  No erythema or warmth over the right foot or ankle.  Denies any trauma.  Able to lift her legs off the bed though there is some deconditioning noted. Neurologic: Alert and oriented x3.  No focal neurological deficits.     Lab Results:  Data Reviewed: I have personally reviewed following labs and reports of the imaging studies  CBC: Recent Labs  Lab 03/20/22 0556 03/23/22 0559 03/24/22 0528  WBC 4.4 4.3 9.5  HGB 11.6* 11.9* 11.9*  HCT 33.5* 35.4* 34.5*  MCV 94.1 96.2 94.3  PLT 385 362 309     Basic Metabolic Panel: Recent Labs  Lab 03/20/22 0556 03/21/22 0502 03/22/22 0625 03/23/22 0559 03/24/22 0528 03/25/22 0604 03/26/22 0540  NA 135 133* 133* 132* 132* 132* 136  K 3.3* 3.9 3.7 3.2* 3.7 3.6 3.8  CL 95* 95* 92* 92*  89* 88* 87*  CO2 '28 26 28 27 '$ 32 33* 36*  GLUCOSE 116* 110* 110* 131* 114* 134* 137*  BUN 41* 44* 49* 51* 50* 49* 52*  CREATININE 2.79* 2.78* 2.77* 2.68* 2.69* 2.41* 2.74*  CALCIUM 8.9 8.5* 8.6* 8.2* 8.4* 8.3* 9.1  PHOS 5.0* 4.6  --   --   --   --   --      GFR: Estimated Creatinine Clearance: 18.5 mL/min (A) (by C-G formula based on SCr of 2.74 mg/dL (H)).  Liver Function Tests: Recent Labs  Lab 03/20/22 0556 03/21/22 0502  ALBUMIN 3.2* 2.9*      Coagulation Profile: Recent Labs  Lab 03/23/22 0559  INR 1.0      CBG: Recent Labs  Lab 03/22/22 0826 03/23/22 0741 03/24/22 0744 03/25/22 0754 03/26/22 0735  GLUCAP 86 113* 105* 127* 136*      Radiology Studies: No results found.     LOS: 8 days   Latasha Puskas [employer] on [email or web address]  03/26/2022, 9:48 AM

## 2022-03-26 NOTE — Progress Notes (Signed)
[facility] Progress Note    Home meds: atrovastatin, estradiol, methotrexate '10mg'$  q wed, MVI, olmesartan 40 qd, buproprion, chlorthalidone 25 qd, furosemide 40 qd      Renal US - 10.6/ 9.6 cm kidneys w/o hydro, normal bladder      Jan 2017 - ANA +speckled, titer >= 1:1280       Jan 2017 - RF = +46 (15-41 = low, >= 42 is high)       HIV neg, hep B/C negative, UP/C ratio = 0.22         Assessment/ Plan Acute kidney injury - suspect hemodynamic in setting of anasarca, decreased EABV, renal hypoperfusion/ NSAID use + ARB effect.  Other potential dx's are autoimmune, paraproteinemia.  Albumin dropped to mid 3s in last 2 mos but no sig proteinuria, so doesn't look like neph syndrome. . Cont to hold ARB and nsaids. Initially started IV lasix 80 bid and did not respond and then ^120 mg IV tid + zaroxolyn daily. C3C4 NL, HBV, HCV, HIV, ANCA, GBM all neg. K/L elevated which could be seen w/ renal failure. [place] WBC's in urine but 0-5 RBC's w/ rare bact. UPC only 0.22.  ANA pos. Appreciate VIR performing the biopsy on 8/30, no e/o bleeding.  - Pt  was not feeling well, renal function not improving as much as would like and patient wanted an answer. Decided to bx in case prednisone for AIN is an option -> Arkana 30% glomerular and interstitial scarring with no active disease to suggest AIN, nothing to suggest acute GN. Patient likely has a chronic component secondary to the long standing heavy NSAID use with resulting CKD and acute component from hypoperfusion + NSAID use + ARB.  BP is on the lower side with less UOP; will hold Lasix for now; HOC3 36 suggesting contraction alkalosis.  Don't want to lower BP too much and prolong recovery period.  - Avoid all nsaids in the future. May have AIN; she's already starting to improve and no steroids for now.    Anasarca: Likely associated with acute kidney injury and sodium retention effect of heavy NSAIDs with ongoing ARB use.  Starting to diurese  as above. - LUE duplex r/o DVT (LUE >>>RUE which is new for her) -> neg for DVT Epigastric pain: possible gastritis associated with NSAID use.  Medical management per primary service with H2 RA +/- Carafate (the latter for maximum 72 hours use in setting of acute kidney injury to limit aluminum toxicity). Hypertension: no ARB/ acei for this pt w/ AKI. BP's soft, not on any BP lowering meds.  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 She had been on meloxicam at some point in the past.    Subjective: 775 cc UOP yesterday, BP on low side. Creat 2.7 today. Pt waxes and wanes, feeling better than yest. Legs are not as swollen and not as painful.   Vitals:   03/25/22 1358 03/25/22 2101 03/26/22 0500 03/26/22 0535  BP: 99/62 105/64  95/64  Pulse: 100 97  96  Resp: '16 18  18  '$ Temp: 98.5 F (36.9 C) 98.2 F (36.8 C)  97.6 F (36.4 C)  TempSrc: Oral Oral  Oral  SpO2: 98% 91%  96%  Weight:   60.8 kg   Height:        Exam: Gen no distress, looks more comfortable today  No JVD Chest  no rales or wheezing Cor reg no RG ABd soft, distended, mild tenderness in epigastric area Ext 1+ bilat thigh edema, improving, LUE >>>RUE Neuro Ox 3, nonfocal    Recent Labs  Lab 03/20/22 0556 03/21/22 0502 03/22/22 0625 03/23/22 0559 03/24/22 0528 03/25/22 0604 03/26/22 0540  HGB 11.6*  --   --  11.9* 11.9*  --   --   ALBUMIN 3.2* 2.9*  --   --   --   --   --   CALCIUM 8.9 8.5*   < > 8.2* 8.4* 8.3* 9.1  PHOS 5.0* 4.6  --   --   --   --   --   CREATININE 2.79* 2.78*   < > 2.68* 2.69* 2.41* 2.74*  K 3.3* 3.9   < > 3.2* 3.7 3.6 3.8   < > = values in this interval not displayed.   No results for input(s): "IRON", "TIBC", "FERRITIN" in the last 168 hours. Inpatient medications:  atorvastatin  40 mg Oral Daily   diclofenac Sodium  2 g Topical QID   folic acid  1 mg  Oral Daily   furosemide  80 mg Intravenous BID   gabapentin  300 mg Oral Daily   heparin  5,000 Units Subcutaneous Q8H   multivitamin with minerals  1 tablet Oral Daily   pantoprazole  40 mg Oral Daily   polyethylene glycol  17 g Oral Daily   predniSONE  20 mg Oral BID WC   senna-docusate  2 tablet Oral BID     acetaminophen **OR** acetaminophen, fentaNYL (SUBLIMAZE) injection, hydrALAZINE, ondansetron **OR** ondansetron (ZOFRAN) IV, mouth rinse, oxyCODONE

## 2022-03-27 DIAGNOSIS — M79605 Pain in left leg: Secondary | ICD-10-CM | POA: Diagnosis not present

## 2022-03-27 DIAGNOSIS — R601 Generalized edema: Secondary | ICD-10-CM | POA: Diagnosis not present

## 2022-03-27 DIAGNOSIS — M79604 Pain in right leg: Secondary | ICD-10-CM | POA: Diagnosis not present

## 2022-03-27 DIAGNOSIS — N179 Acute kidney failure, unspecified: Secondary | ICD-10-CM | POA: Diagnosis not present

## 2022-03-27 LAB — BASIC METABOLIC PANEL
Anion gap: 12 (ref 5–15)
BUN: 52 mg/dL — ABNORMAL HIGH (ref 8–23)
CO2: 33 mmol/L — ABNORMAL HIGH (ref 22–32)
Calcium: 8.8 mg/dL — ABNORMAL LOW (ref 8.9–10.3)
Chloride: 83 mmol/L — ABNORMAL LOW (ref 98–111)
Creatinine, Ser: 2.28 mg/dL — ABNORMAL HIGH (ref 0.44–1.00)
GFR, Estimated: 24 mL/min — ABNORMAL LOW (ref >60)
Glucose, Bld: 184 mg/dL — ABNORMAL HIGH (ref 70–99)
Potassium: 3.6 mmol/L (ref 3.5–5.1)
Sodium: 128 mmol/L — ABNORMAL LOW (ref 135–145)

## 2022-03-27 MED ORDER — PNEUMOCOCCAL 20-VAL CONJ VACC 0.5 ML IM SUSY: 0.5000 mL | PREFILLED_SYRINGE | INTRAMUSCULAR | Status: DC | PRN

## 2022-03-27 MED ORDER — ALBUMIN HUMAN 25 % IV SOLN
25.0000 g | Freq: Once | INTRAVENOUS | Status: AC
Start: 2022-03-27 — End: 2022-03-27
  Administered 2022-03-27: 25 g via INTRAVENOUS
  Filled 2022-03-27: qty 50
  Filled 2022-03-27: qty 100

## 2022-03-27 MED ORDER — FUROSEMIDE 10 MG/ML IJ SOLN
80.0000 mg | Freq: Once | INTRAMUSCULAR | Status: AC
  Administered 2022-03-27: 80 mg via INTRAVENOUS
  Filled 2022-03-27: qty 8

## 2022-03-27 NOTE — Progress Notes (Signed)
[facility] PROGRESS NOTE   RANDELL DETTER JAS:[id number] DOB: September 26, 1960 DOA: 03/17/2022  PCP: Doretha Sou, MD  Brief History/Interval Summary: 61 y.o. female with medical history significant for but not limited to hypertension, uncontrolled diabetes mellitus type 2 with recent hemoglobin A1c of 13.5, dyslipidemia, history of seronegative rheumatoid arthritis,  who presented with swelling of her left upper extremity and bilateral lower extremities and abdomen with epigastric abdominal discomfort and pain that has been going on for several months now.  Patient has a normal baseline creatinine of 0.7-0.9 and states that she has had worsening swelling and pain of her extremities as well as abdomen.  States that she vomited twice and has not really been able to eat anything given her gastric distention and epigastric pain.  She has been taking ibuprofen at least 2-3 times a day due to her pain and has been having some abdominal discomfort and diarrhea with metformin which she stopped because of this and subsequently states that her blood sugars elevated.  Given her lower extremity swelling she was seen by cardiology in outpatient setting who did an echocardiogram which showed a preserved ejection fracture with mild pericardial effusion.  She was initiated on furosemide but did not take this on a consistent basis.  She has been having worsening lower extremity edema and has been following Dr. Amil Amen for rheumatology care and had another medication that she was taken for her lupus.  She notes that she has had worsening abdominal discomfort and distention as well as lower extremity swelling and edema with no appreciable rashes.  She denies any other concerns or complaints at this time.  Given her persistence in her symptoms and lower extremity pain and abdominal pain she presented to the ED for further evaluation.  She was hospitalized for further management.  Consultants:  Nephrology  Procedures: Renal biopsy 8/30   Subjective/Interval History: Patient mentions that she is feeling much better this morning.  Leg pain has almost resolved.  Foot pain and ankle pain on the right is almost resolved.  She is passing urine.  She was able to ambulate yesterday in the hallway.     Assessment/Plan:  Acute kidney injury This is in the setting of NSAID use along with other nephrotoxic medications including Benicar and furosemide and chlorthalidone.  Baseline creatinine 0.7-0.9.  Presented with creatinine of 4.0.   Renal ultrasound did not show any hydronephrosis. Nephrology was consulted.  Patient underwent renal biopsy on 8/30. Furosemide was ordered however could not be given consistently due to hypotension.  It was subsequently held.  Subsequently resumed at a lower dose.  Metolazone was discontinued. Furosemide has been discontinued by nephrology.  Renal function stable for the most part.  Creatinine noted to be 2.28 today from 2.74 yesterday. Pathology report is still pending.  Anasarca with pleural effusions and pericardial effusion Likely secondary to renal failure.   Echocardiogram showed normal systolic function.  Echo also showed small pericardial effusion. She underwent upper and lower extremity venous Dopplers which did not show any DVT. Currently off of diuretics.  Bilateral lower extremity pain Apparently patient has been having bilateral lower extremity pain for 2 months.  The pain is mainly from the knee down, Both legs.  Yesterday she was complaining of pain in the right ankle and foot.  She was started on gabapentin due to concern for neuropathic pain.  Pain appears to have improved.  She does not have motor deficits per se. No low back pain.  No clear  indication to do MRI of the spine at this time. She does have a history of rheumatoid arthritis and could be experiencing a flare.  She was subsequently started on prednisone.  She feels much better this  morning.  Range of motion of the right ankle and right knee and left knee has improved significantly.  She was able to ambulate in the hallway yesterday.  Adrenal adenoma Outpatient management.  Seronegative rheumatoid arthritis Followed by rheumatology.  Has been on methotrexate.  Currently on hold.  Apparently she does not have a history of lupus.  Hyponatremia Sodium levels are fluctuating.  Noted to be 128 today.  Hypokalemia Repleted  Normocytic anemia No evidence of overt bleeding.  Hemoglobin has been stable.  Diabetes mellitus type 2 Recent HbA1c was 13.5.  Repeat A1c here is 7.5.  Monitor CBGs.  Thrombocytosis Resolved.  Likely reactive.  DVT Prophylaxis: Subcutaneous heparin Code Status: Full code Family Communication: Discussed with the patient Disposition Plan: SNF was recommended by PT but at that time she was experiencing significant lower extremity pain which has since improved.  She has ambulated in the hallway.  She would like to go home instead of going to rehab whenever she is ready for discharge.  Status is: Inpatient Remains inpatient appropriate because: Acute kidney injury      Medications: Scheduled:  atorvastatin  40 mg Oral Daily   diclofenac Sodium  2 g Topical QID   folic acid  1 mg Oral Daily   gabapentin  300 mg Oral Daily   heparin  5,000 Units Subcutaneous Q8H   multivitamin with minerals  1 tablet Oral Daily   pantoprazole  40 mg Oral Daily   polyethylene glycol  17 g Oral Daily   predniSONE  20 mg Oral BID WC   senna-docusate  2 tablet Oral BID   Continuous:   EHM:[id number] **OR** acetaminophen, fentaNYL (SUBLIMAZE) injection, hydrALAZINE, ondansetron **OR** ondansetron (ZOFRAN) IV, mouth rinse, oxyCODONE  Antibiotics: Anti-infectives (From admission, onward)    None       Objective:  Vital Signs  Vitals:   03/26/22 1433 03/26/22 2017 03/27/22 0452 03/27/22 0455  BP: 109/76 121/69  106/74  Pulse: 66 92  85   Resp: (!) '22 18  18  '$ Temp: 98.1 F (36.7 C) 98.1 F (36.7 C)  97.9 F (36.6 C)  TempSrc: Oral Oral  Oral  SpO2: 100% 93%  94%  Weight:   57.6 kg   Height:        Intake/Output Summary (Last 24 hours) at 03/27/2022 1049 Last data filed at 03/27/2022 0800 Gross per 24 hour  Intake 948 ml  Output --  Net 948 ml    Filed Weights   03/25/22 0556 03/26/22 0500 03/27/22 0452  Weight: 57.2 kg 60.8 kg 57.6 kg    General appearance: Awake alert.  In no distress Resp: Clear to auscultation bilaterally.  Normal effort Cardio: S1-S2 is normal regular.  No S3-S4.  No rubs murmurs or bruit GI: Abdomen is soft.  Nontender nondistended.  Bowel sounds are present normal.  No masses organomegaly Extremities: Improved range of motion of both lower extremities. Neurologic: Alert and oriented x3.  No focal neurological deficits.       Lab Results:  Data Reviewed: I have personally reviewed following labs and reports of the imaging studies  CBC: Recent Labs  Lab 03/23/22 0559 03/24/22 0528  WBC 4.3 9.5  HGB 11.9* 11.9*  HCT 35.4* 34.5*  MCV 96.2 94.3  PLT 362  309     Basic Metabolic Panel: Recent Labs  Lab 03/21/22 0502 03/22/22 9381 03/23/22 0559 03/24/22 0528 03/25/22 0604 03/26/22 0540 03/27/22 0538  NA 133*   < > 132* 132* 132* 136 128*  K 3.9   < > 3.2* 3.7 3.6 3.8 3.6  CL 95*   < > 92* 89* 88* 87* 83*  CO2 26   < > 27 32 33* 36* 33*  GLUCOSE 110*   < > 131* 114* 134* 137* 184*  BUN 44*   < > 51* 50* 49* 52* 52*  CREATININE 2.78*   < > 2.68* 2.69* 2.41* 2.74* 2.28*  CALCIUM 8.5*   < > 8.2* 8.4* 8.3* 9.1 8.8*  PHOS 4.6  --   --   --   --   --   --    < > = values in this interval not displayed.     GFR: Estimated Creatinine Clearance: 20.5 mL/min (A) (by C-G formula based on SCr of 2.28 mg/dL (H)).  Liver Function Tests: Recent Labs  Lab 03/21/22 0502  ALBUMIN 2.9*      Coagulation Profile: Recent Labs  Lab 03/23/22 0559  INR 1.0       CBG: Recent Labs  Lab 03/22/22 0826 03/23/22 0741 03/24/22 0744 03/25/22 0754 03/26/22 0735  GLUCAP 86 113* 105* 127* 136*      Radiology Studies: No results found.     LOS: 9 days   Elena Cothern [employer] on [email or web address]  03/27/2022, 10:49 AM

## 2022-03-27 NOTE — Plan of Care (Signed)

## 2022-03-27 NOTE — Progress Notes (Addendum)
[facility] Progress Note    Home meds: atrovastatin, estradiol, methotrexate '10mg'$  q wed, MVI, olmesartan 40 qd, buproprion, chlorthalidone 25 qd, furosemide 40 qd      Renal US - 10.6/ 9.6 cm kidneys w/o hydro, normal bladder      Jan 2017 - ANA +speckled, titer >= 1:1280       Jan 2017 - RF = +46 (15-41 = low, >= 42 is high)       HIV neg, hep B/C negative, UP/C ratio = 0.22         Assessment/ Plan Acute kidney injury - suspect hemodynamic in setting of anasarca, decreased EABV, renal hypoperfusion/ NSAID use + ARB effect.  Other potential dx's are autoimmune, paraproteinemia.  Albumin dropped to mid 3s in last 2 mos but no sig proteinuria, so doesn't look like neph syndrome. . Cont to hold ARB and nsaids. Initially started IV lasix 80 bid and did not respond and then ^120 mg IV tid + zaroxolyn daily. C3C4 NL, HBV, HCV, HIV, ANCA, GBM all neg. K/L elevated which could be seen w/ renal failure. [place]id WBC's in urine but 0-5 RBC's w/ rare bact. UPC only 0.22.  ANA pos. Appreciate VIR performing the biopsy on 8/30, no e/o bleeding.  - We ended up bx as earlier in the week she just wasn't feeling well, renal function improvement had stalled and patient wanted an answer. Decided to bx in case prednisone for AIN is an option -> Arkana 30% glomerular and interstitial scarring with no active disease to suggest AIN, nothing to suggest acute GN. Patient likely has a chronic component secondary to the long standing heavy NSAID use with resulting CKD and acute component from hypoperfusion + NSAID use + ARB.  Held Lasix with 36 suggesting contraction alkalosis + worsening renal function but according to the pt UOP has dropped significantly. Still significant edema present though it's MARKEDLY improved from at admission.  I am only going to give a single dose of Lasix '80mg'$  x1 with Albumin. Will follow closely will need 2 week follow up with [facility] upon d/c. I am also having the nurse get a floor  scale weight after pt uses the restroom. Need to confirm that she actually is down to 57kg as listed this am -> it's 58.4kg.  - Avoid all nsaids in the future. May have AIN; she's already starting to improve and no steroids for now.    Anasarca: Likely associated with acute kidney injury and sodium retention effect of heavy NSAIDs with ongoing ARB use.  Starting to diurese as above. - LUE duplex r/o DVT (LUE >>>RUE which is new for her) -> neg for DVT Epigastric pain: possible gastritis associated with NSAID use.  Medical management per primary service with H2 RA +/- Carafate (the latter for maximum 72 hours use in setting of acute kidney injury to limit aluminum toxicity). Hypertension: no ARB/ acei for this pt w/ AKI. BP's soft, not on any BP lowering meds.  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 She had been on meloxicam at some point in the past.    Subjective: weight down 3kg but not sure I believe it; according to the patient UOP has decreased significantly yesterday evening and she hasn't gone today. Cr has continued to improve. Pt waxes and wanes, feeling better than yest. Legs are not as swollen and not  as painful.   Vitals:   03/26/22 2017 03/27/22 0452 03/27/22 0455 03/27/22 1240  BP: 121/69  106/74 115/66  Pulse: 92  85 88  Resp: '18  18 20  '$ Temp: 98.1 F (36.7 C)  97.9 F (36.6 C) (!) 97.5 F (36.4 C)  TempSrc: Oral  Oral Oral  SpO2: 93%  94% 96%  Weight:  57.6 kg    Height:        Exam: Gen no distress, looks more comfortable today No JVD Chest  no rales or wheezing Cor reg no RG ABd soft, distended, mild tenderness in epigastric area Ext 1+ bilat thigh edema, improving, LUE >>>RUE Neuro Ox 3, nonfocal    Recent Labs  Lab 03/21/22 0502 03/22/22 0625 03/23/22 0559 03/24/22 0528 03/25/22 0604 03/26/22 0540 03/27/22 0538   HGB  --   --  11.9* 11.9*  --   --   --   ALBUMIN 2.9*  --   --   --   --   --   --   CALCIUM 8.5*   < > 8.2* 8.4*   < > 9.1 8.8*  PHOS 4.6  --   --   --   --   --   --   CREATININE 2.78*   < > 2.68* 2.69*   < > 2.74* 2.28*  K 3.9   < > 3.2* 3.7   < > 3.8 3.6   < > = values in this interval not displayed.   No results for input(s): "IRON", "TIBC", "FERRITIN" in the last 168 hours. Inpatient medications:  atorvastatin  40 mg Oral Daily   diclofenac Sodium  2 g Topical QID   folic acid  1 mg Oral Daily   gabapentin  300 mg Oral Daily   heparin  5,000 Units Subcutaneous Q8H   multivitamin with minerals  1 tablet Oral Daily   pantoprazole  40 mg Oral Daily   polyethylene glycol  17 g Oral Daily   predniSONE  20 mg Oral BID WC   senna-docusate  2 tablet Oral BID     acetaminophen **OR** acetaminophen, fentaNYL (SUBLIMAZE) injection, hydrALAZINE, ondansetron **OR** ondansetron (ZOFRAN) IV, mouth rinse, oxyCODONE

## 2022-03-28 DIAGNOSIS — N179 Acute kidney failure, unspecified: Secondary | ICD-10-CM | POA: Diagnosis not present

## 2022-03-28 LAB — BASIC METABOLIC PANEL
Anion gap: 15 (ref 5–15)
BUN: 48 mg/dL — ABNORMAL HIGH (ref 8–23)
CO2: 32 mmol/L (ref 22–32)
Calcium: 9.4 mg/dL (ref 8.9–10.3)
Chloride: 81 mmol/L — ABNORMAL LOW (ref 98–111)
Creatinine, Ser: 1.8 mg/dL — ABNORMAL HIGH (ref 0.44–1.00)
GFR, Estimated: 32 mL/min — ABNORMAL LOW (ref >60)
Glucose, Bld: 172 mg/dL — ABNORMAL HIGH (ref 70–99)
Potassium: 3.4 mmol/L — ABNORMAL LOW (ref 3.5–5.1)
Sodium: 128 mmol/L — ABNORMAL LOW (ref 135–145)

## 2022-03-28 LAB — CBC
HCT: 36.4 % (ref 36.0–46.0)
Hemoglobin: 12.3 g/dL (ref 12.0–15.0)
MCH: 32 pg (ref 26.0–34.0)
MCHC: 33.8 g/dL (ref 30.0–36.0)
MCV: 94.8 fL (ref 80.0–100.0)
Platelets: 678 10*3/uL — ABNORMAL HIGH (ref 150–400)
RBC: 3.84 MIL/uL — ABNORMAL LOW (ref 3.87–5.11)
RDW: 14.4 % (ref 11.5–15.5)
WBC: 9.2 10*3/uL (ref 4.0–10.5)
nRBC: 0 % (ref 0.0–0.2)

## 2022-03-28 LAB — GLUCOSE, CAPILLARY: Glucose-Capillary: 170 mg/dL — ABNORMAL HIGH (ref 70–99)

## 2022-03-28 MED ORDER — FUROSEMIDE 40 MG PO TABS
40.0000 mg | ORAL_TABLET | Freq: Two times a day (BID) | ORAL | 0 refills | Status: DC
Start: 2022-03-28 — End: 2022-04-11

## 2022-03-28 MED ORDER — FUROSEMIDE 40 MG PO TABS
40.0000 mg | ORAL_TABLET | Freq: Two times a day (BID) | ORAL | Status: DC
  Administered 2022-03-28: 40 mg via ORAL
  Filled 2022-03-28: qty 1

## 2022-03-28 MED ORDER — METHOTREXATE SODIUM 2.5 MG PO TABS: 10.0000 mg | ORAL_TABLET | ORAL | Status: DC

## 2022-03-28 MED ORDER — GABAPENTIN 300 MG PO CAPS
300.0000 mg | ORAL_CAPSULE | Freq: Every day | ORAL | 1 refills | Status: AC
Start: 2022-03-29 — End: ?

## 2022-03-28 MED ORDER — OXYCODONE HCL 5 MG PO TABS: 5.0000 mg | ORAL_TABLET | ORAL | 0 refills | Status: DC | PRN

## 2022-03-28 MED ORDER — POLYETHYLENE GLYCOL 3350 17 G PO PACK: 17.0000 g | PACK | Freq: Every day | ORAL | 0 refills | Status: DC

## 2022-03-28 MED ORDER — POTASSIUM CHLORIDE CRYS ER 20 MEQ PO TBCR
40.0000 meq | EXTENDED_RELEASE_TABLET | Freq: Once | ORAL | Status: AC
  Administered 2022-03-28: 40 meq via ORAL
  Filled 2022-03-28: qty 2

## 2022-03-28 MED ORDER — PREDNISONE 10 MG PO TABS: ORAL_TABLET | ORAL | 0 refills | Status: DC

## 2022-03-28 MED ORDER — POTASSIUM CHLORIDE 20 MEQ PO PACK
40.0000 meq | PACK | Freq: Once | ORAL | Status: AC
  Administered 2022-03-28: 40 meq via ORAL
  Filled 2022-03-28: qty 2

## 2022-03-28 NOTE — TOC Transition Note (Signed)
Transition of Care [facility]) - CM/SW Discharge Note   Patient Details  Name: Alyssa Rhodes MRN: [id number] Date of Birth: 1961-05-03  [facility] CM/SW Contact:  Illene Regulus, LCSW Phone Number: 03/28/2022, 10:46 AM   Clinical Narrative:    Pt to d/c home and not rehab. CSW spoke with pt about recommendations for DME. Pt has agreed with rolling walker rec. Pt have agreed for the walker to be delivered to their home. CSW spoke with Andee Poles with [place] for a referral for a youth walker. No additional needs, TOC sign-off.     Final next level of care: Home/Self Care Barriers to Discharge: No Barriers Identified   Patient Goals and CMS Choice Patient states their goals for this hospitalization and ongoing recovery are:: return home CMS Medicare.gov Compare Post Acute Care list provided to:: Patient Choice offered to / list presented to : Patient  Discharge Placement                       Discharge Plan and Services In-house Referral: NA Discharge Planning Services: NA Post Acute Care Choice: Durable Medical Equipment, Home Health          DME Arranged: [facility]ford Rile youth DME Agency: [employer] Date DME Agency Contacted: 03/28/22 Time DME Agency Contacted: [id number] Representative spoke with at DME Agency: danielle            Social Determinants of Health ([place]) Interventions     Readmission Risk Interventions     No data to display

## 2022-03-28 NOTE — Progress Notes (Signed)
Physical Therapy Treatment Patient Details Name: Alyssa Rhodes MRN: [id number] DOB: [birth year] Today's Date: 03/28/2022   History of Present Illness Pt is a 61 yo female admitted with swelling of BLEs and LUE and abdomen for last month.  Pt found to have AKI and pleural and pericardial effusions. PMH: HTN, DM, RA, lupus.    PT Comments    General Comments: AxO x 3 very pleasant Lady "feeling better" and reported decreased B LE "NERVE" pain after receiving Gabapentine and Predisone.  Pt excited to be able to go home today. General transfer comment: Improved self ability to rise at [place] with heavy use b UE's.  But able with only 3/10 B LE pain vs 10/10 her last session. General Gait Details: tolerated an increased functional distance with heavy lean on walker.  Not yet able to amb without walker but improving.  Pt will need a YOUTH RW for home.   Recommendations for follow up therapy are one component of a multi-disciplinary discharge planning process, led by the attending physician.  Recommendations may be updated based on patient status, additional functional criteria and insurance authorization.  Follow Up Recommendations  Home health PT (per chart review, pt declines SNF and wants to go home with family support.) Can patient physically be transported by private vehicle: Yes   Assistance Recommended at Discharge Set up Supervision/Assistance  Patient can return home with the following A little help with walking and/or transfers;Assistance with cooking/housework;A little help with bathing/dressing/bathroom;Assist for transportation   Equipment Recommendations  Rolling walker (2 wheels) (youth)    Recommendations for Other Services       Precautions / Restrictions Precautions Precautions: Fall Precaution Comments: LUPUS Restrictions Weight Bearing Restrictions: No     Mobility  Bed Mobility               General bed mobility comments: sitting on EOB Indep.     Transfers Overall transfer level: Needs assistance Equipment used: Rolling walker (2 wheels) Transfers: Sit to/from Stand Sit to Stand: Min assist           General transfer comment: Improved self ability to rise at [place] with heavy use b UE's.  But able with only 3/10 B LE pain vs 10/10 her last session.    Ambulation/Gait Ambulation/Gait assistance: Min assist Gait Distance (Feet): 42 Feet Assistive device: Rolling walker (2 wheels) Gait Pattern/deviations: Step-to pattern, Decreased step length - left, Decreased step length - right, Trunk flexed, Narrow base of support Gait velocity: decreased     General Gait Details: tolerated an increased functional distance with heavy lean on walker.  Not yet able to amb without walker but improving.  Pt will need a YOUTH RW for home.   Stairs             Wheelchair Mobility    Modified Rankin (Stroke Patients Only)       Balance                                            Cognition Arousal/Alertness: Awake/alert Behavior During Therapy: WFL for tasks assessed/performed Overall Cognitive Status: Within Functional Limits for tasks assessed                                 General Comments: AxO x 3 very pleasant  Lady "feeling better" and reported decreased B LE "NERVE" pain after receiving Gabapentine and Predisone.  Pt excited to be able to go home today.        Exercises      General Comments        Pertinent Vitals/Pain Pain Assessment Pain Assessment: Faces Pain Location: B LE pain "improving" Pain Descriptors / Indicators: Grimacing Pain Intervention(s): Monitored during session, Repositioned    Home Living                          Prior Function            PT Goals (current goals can now be found in the care plan section) Progress towards PT goals: Progressing toward goals    Frequency    Min 3X/week      PT Plan Current plan remains  appropriate    Co-evaluation              AM-PAC PT "6 Clicks" Mobility   Outcome Measure  Help needed turning from your back to your side while in a flat bed without using bedrails?: A Little Help needed moving from lying on your back to sitting on the side of a flat bed without using bedrails?: A Little Help needed moving to and from a bed to a chair (including a wheelchair)?: A Little Help needed standing up from a chair using your arms (e.g., wheelchair or bedside chair)?: A Little Help needed to walk in hospital room?: A Little Help needed climbing 3-5 steps with a railing? : A Little 6 Click Score: 18    End of Session Equipment Utilized During Treatment: Gait belt Activity Tolerance: Patient tolerated treatment well Patient left: in bed;with call bell/phone within reach Nurse Communication: Mobility status PT Visit Diagnosis: Other abnormalities of gait and mobility (R26.89)     Time: 9833-8250 PT Time Calculation (min) (ACUTE ONLY): 18 min  Charges:  $Gait Training: 8-22 mins                     Rica Koyanagi  PTA [place] [facility] M-F          [phone] Weekend pager [phone]

## 2022-03-28 NOTE — Discharge Summary (Signed)
[facility]  Physician Discharge Summary   Patient ID: Alyssa Rhodes MRN: [id number] DOB/AGE: June 18, 1961 61 y.o.  Admit date: 03/17/2022 Discharge date: 03/28/2022    PCP: Doretha Sou, MD  DISCHARGE DIAGNOSES:    AKI (acute kidney injury) (Alpha) Hyponatremia Anasarca Adrenal adenoma Seronegative rheumatoid arthritis Normocytic anemia Diabetes mellitus type 2    RECOMMENDATIONS FOR OUTPATIENT FOLLOW UP: Nephrology to arrange outpatient follow-up Renal biopsy pathology report is still pending   Home Health: Home health PT and OT Equipment/Devices: Walker  CODE STATUS: Full code  DISCHARGE CONDITION: fair  Diet recommendation: As before  INITIAL HISTORY: 61 y.o. female with medical history significant for but not limited to hypertension, uncontrolled diabetes mellitus type 2 with recent hemoglobin A1c of 13.5, dyslipidemia, history of seronegative rheumatoid arthritis,  who presented with swelling of her left upper extremity and bilateral lower extremities and abdomen with epigastric abdominal discomfort and pain that has been going on for several months now.  Patient has a normal baseline creatinine of 0.7-0.9 and states that she has had worsening swelling and pain of her extremities as well as abdomen.  States that she vomited twice and has not really been able to eat anything given her gastric distention and epigastric pain.  She has been taking ibuprofen at least 2-3 times a day due to her pain and has been having some abdominal discomfort and diarrhea with metformin which she stopped because of this and subsequently states that her blood sugars elevated.  Given her lower extremity swelling she was seen by cardiology in outpatient setting who did an echocardiogram which showed a preserved ejection fracture with mild pericardial effusion.  She was initiated on furosemide but did not take this on a consistent basis.  She has been having worsening lower extremity  edema and has been following Dr. Amil Amen for rheumatology care and had another medication that she was taken for her lupus.  She notes that she has had worsening abdominal discomfort and distention as well as lower extremity swelling and edema with no appreciable rashes.  She denies any other concerns or complaints at this time.  Given her persistence in her symptoms and lower extremity pain and abdominal pain she presented to the ED for further evaluation.  She was hospitalized for further management.   Consultants: Nephrology   Procedures: Renal biopsy 8/30    HOSPITAL COURSE:   Acute kidney injury This was in the setting of NSAID use along with other nephrotoxic medications including Benicar and furosemide and chlorthalidone.  Baseline creatinine 0.7-0.9.  Presented with creatinine of 4.0.   Renal ultrasound did not show any hydronephrosis. Nephrology was consulted.  Patient underwent renal biopsy on 8/30. Furosemide was ordered however could not be given consistently due to hypotension.  It was subsequently held.  Subsequently resumed at a lower dose.  Metolazone was discontinued. Renal function gradually improving.  Nephrology has cleared the patient for discharge.  She will be discharged on twice daily furosemide for 7 days and then nephrology will arrange outpatient appointment with the blood work and determine further course of action.  Biopsy report is still pending at the time of discharge.   Anasarca with pleural effusions and pericardial effusion Likely secondary to renal failure.   Echocardiogram showed normal systolic function.  Echo also showed small pericardial effusion. She underwent upper and lower extremity venous Dopplers which did not show any DVT. Edema has improved.   Bilateral lower extremity pain Apparently patient has been having bilateral lower extremity pain  for 2 months.  The pain is mainly from the knee down, Both legs.  Yesterday she was complaining of pain in  the right ankle and foot.  She was started on gabapentin due to concern for neuropathic pain.  Pain appears to have improved.  She does not have motor deficits per se. No low back pain.  No clear indication to do MRI of the spine at this time. She does have a history of rheumatoid arthritis and could be experiencing a flare.  She was subsequently started on prednisone.  Does have significantly improved.  She has been able to ambulate in the hallway with a walker.  Will be discharged on a steroid taper.   Adrenal adenoma Outpatient management.   Seronegative rheumatoid arthritis Followed by rheumatology.  Has been on methotrexate.  Methotrexate to stay on hold until renal function has stabilized.  Apparently she does not have a history of lupus.   Hyponatremia Likely due to renal dysfunction and anasarca.    Hypokalemia Repleted   Normocytic anemia No evidence of overt bleeding.  Hemoglobin has been stable.   Diabetes mellitus type 2   Thrombocytosis Resolved.  Likely reactive.   Patient is stable.  Cleared by nephrology.  Okay for discharge home today.   PERTINENT LABS:  The results of significant diagnostics from this hospitalization (including imaging, microbiology, ancillary and laboratory) are listed below for reference.     Labs:   Basic Metabolic Panel: Recent Labs  Lab 03/24/22 0528 03/25/22 0604 03/26/22 0540 03/27/22 0538 03/28/22 0431  NA 132* 132* 136 128* 128*  K 3.7 3.6 3.8 3.6 3.4*  CL 89* 88* 87* 83* 81*  CO2 32 33* 36* 33* 32  GLUCOSE 114* 134* 137* 184* 172*  BUN 50* 49* 52* 52* 48*  CREATININE 2.69* 2.41* 2.74* 2.28* 1.80*  CALCIUM 8.4* 8.3* 9.1 8.8* 9.4    CBC: Recent Labs  Lab 03/23/22 0559 03/24/22 0528 03/28/22 0431  WBC 4.3 9.5 9.2  HGB 11.9* 11.9* 12.3  HCT 35.4* 34.5* 36.4  MCV 96.2 94.3 94.8  PLT 362 309 678*    BNP: BNP (last 3 results) Recent Labs    02/16/22 1118 03/17/22 1000  BNP 14.7 25.6     CBG: Recent Labs   Lab 03/23/22 0741 03/24/22 0744 03/25/22 0754 03/26/22 0735 03/28/22 0805  GLUCAP 113* 105* 127* 136* 170*     IMAGING STUDIES CT RENAL BIOPSY  Result Date: 03/23/2022 INDICATION: Unexplained acute kidney injury, hypertension, diabetes, and lupus. EXAM: CT GUIDED CORE BIOPSY OF THE LEFT KIDNEY LOWER POLE CORTEX TECHNIQUE: Multidetector CT imaging of the MID ABDOMEN was performed following the standard protocol WITHOUT IV contrast. RADIATION DOSE REDUCTION: This exam was performed according to the departmental dose-optimization program which includes automated exposure control, adjustment of the mA and/or kV according to patient size and/or use of iterative reconstruction technique. MEDICATIONS: 1% LIDOCAINE LOCAL ANESTHESIA/SEDATION: Moderate (conscious) sedation was employed during this procedure. A total of Versed 0.5 mg and Fentanyl 125 mcg was administered intravenously by the radiology nurse. Total intra-service moderate Sedation Time: 15 minutes. The patient's level of consciousness and vital signs were monitored continuously by radiology nursing throughout the procedure under my direct supervision. COMPLICATIONS: None immediate. PROCEDURE: Informed written consent was obtained from the patient after a thorough discussion of the procedural risks, benefits and alternatives. All questions were addressed. Maximal Sterile Barrier Technique was utilized including caps, mask, sterile gowns, sterile gloves, sterile drape, hand hygiene and skin antiseptic. A timeout was  performed prior to the initiation of the procedure. previous imaging reviewed. patient position prone. noncontrast localization CT performed. the left kidney lower pole cortex was localized and marked for a posterolateral approach. Under sterile conditions local anesthesia, CT guidance was utilized to advance the 17 gauge 11.8 cm guide needle to the left kidney lower pole. Needle position confirmed with a CT. 2 18 gauge core biopsies  obtained, 1 cm in length and a second 2 cm in length. These were placed in saline. Samples were solid and intact. Post procedure imaging demonstrates no hemorrhage or hematoma. Patient tolerated biopsy well. IMPRESSION: Successful CT-guided left kidney lower pole cortex core biopsy Electronically Signed   By: Jerilynn Mages.  Shick M.D.   On: 03/23/2022 12:49   VAS [place] UPPER EXTREMITY VENOUS DUPLEX  Result Date: 03/22/2022 UPPER VENOUS STUDY  Patient Name:  ANTIGONE CROWELL  Date of Exam:   03/22/2022 Medical Rec #: [id number]     Accession #:    [id number] Date of Birth: [birth year]     Patient Gender: F Patient Age:   67 years Exam Location:  [facility] Procedure:      VAS [place] UPPER EXTREMITY VENOUS DUPLEX Referring Phys: Otelia Santee --------------------------------------------------------------------------------  Indications: Swelling Risk Factors: None identified. Comparison Study: No prior studies. Performing Technologist: Oliver Hum RVT  Examination Guidelines: A complete evaluation includes B-mode imaging, spectral Doppler, color Doppler, and power Doppler as needed of all accessible portions of each vessel. Bilateral testing is considered an integral part of a complete examination. Limited examinations for reoccurring indications may be performed as noted.  Right Findings: +----------+------------+---------+-----------+----------+-------+ RIGHT     CompressiblePhasicitySpontaneousPropertiesSummary +----------+------------+---------+-----------+----------+-------+ Subclavian    Full       Yes       Yes                      +----------+------------+---------+-----------+----------+-------+  Left Findings: +----------+------------+---------+-----------+----------+-------+ LEFT      CompressiblePhasicitySpontaneousPropertiesSummary +----------+------------+---------+-----------+----------+-------+ IJV           Full       Yes       Yes                       +----------+------------+---------+-----------+----------+-------+ Subclavian    Full       Yes       Yes                      +----------+------------+---------+-----------+----------+-------+ Axillary      Full       Yes       Yes                      +----------+------------+---------+-----------+----------+-------+ Brachial      Full       Yes       Yes                      +----------+------------+---------+-----------+----------+-------+ Radial        Full                                          +----------+------------+---------+-----------+----------+-------+ Ulnar         Full                                          +----------+------------+---------+-----------+----------+-------+  Cephalic      Full                                          +----------+------------+---------+-----------+----------+-------+ Basilic       Full                                          +----------+------------+---------+-----------+----------+-------+  Summary:  Right: No evidence of thrombosis in the subclavian.  Left: No evidence of deep vein thrombosis in the upper extremity. No evidence of superficial vein thrombosis in the upper extremity.  *See table(s) above for measurements and observations.  Diagnosing physician: Deitra Mayo MD Electronically signed by Deitra Mayo MD on 03/22/2022 at 4:57:13 PM.    Final    VAS [place] LOWER EXTREMITY VENOUS (DVT)  Result Date: 03/19/2022  Lower Venous DVT Study Patient Name:  SHAUNETTE GASSNER  Date of Exam:   03/18/2022 Medical Rec #: [id number]     Accession #:    [id number] Date of Birth: [birth year]     Patient Gender: F Patient Age:   75 years Exam Location:  [facility] Procedure:      VAS [place] LOWER EXTREMITY VENOUS (DVT) Referring Phys: Eulogio Bear --------------------------------------------------------------------------------  Indications: Edema.  Risk Factors: Lupus. Limitations: Poor ultrasound/tissue  interface. Comparison Study: No previous exams Performing Technologist: Jody Hill RVT, RDMS  Examination Guidelines: A complete evaluation includes B-mode imaging, spectral Doppler, color Doppler, and power Doppler as needed of all accessible portions of each vessel. Bilateral testing is considered an integral part of a complete examination. Limited examinations for reoccurring indications may be performed as noted. The reflux portion of the exam is performed with the patient in reverse Trendelenburg.  +--------+---------------+---------+-----------+----------+--------------------+ RIGHT   CompressibilityPhasicitySpontaneityPropertiesThrombus Aging       +--------+---------------+---------+-----------+----------+--------------------+ CFV     Full           Yes      Yes                                       +--------+---------------+---------+-----------+----------+--------------------+ SFJ     Full                                                              +--------+---------------+---------+-----------+----------+--------------------+ FV Prox Full           Yes      Yes                                       +--------+---------------+---------+-----------+----------+--------------------+ FV Mid  Full           Yes      Yes                                       +--------+---------------+---------+-----------+----------+--------------------+ FV      Full  Yes      Yes                                       Distal                                                                    +--------+---------------+---------+-----------+----------+--------------------+ PFV                    Yes      Yes                  patent by                                                                 color/doppler        +--------+---------------+---------+-----------+----------+--------------------+ POP     Full           Yes      Yes                                        +--------+---------------+---------+-----------+----------+--------------------+ PTV     Full                                                              +--------+---------------+---------+-----------+----------+--------------------+ PERO    Full                                                              +--------+---------------+---------+-----------+----------+--------------------+ Difficulty compressing CFV/SFJ due to patient bearing down due to discomfort.  +--------+---------------+---------+-----------+----------+--------------------+ LEFT    CompressibilityPhasicitySpontaneityPropertiesThrombus Aging       +--------+---------------+---------+-----------+----------+--------------------+ CFV     Full           Yes      Yes                                       +--------+---------------+---------+-----------+----------+--------------------+ SFJ     Full                                                              +--------+---------------+---------+-----------+----------+--------------------+ FV Prox Full  Yes      Yes                                       +--------+---------------+---------+-----------+----------+--------------------+ FV Mid  Full           Yes      Yes                                       +--------+---------------+---------+-----------+----------+--------------------+ FV                     Yes      Yes                  patent by            Distal                                               color/doppler        +--------+---------------+---------+-----------+----------+--------------------+ PFV     Full           Yes      Yes                                       +--------+---------------+---------+-----------+----------+--------------------+ POP     Full           Yes      Yes                                        +--------+---------------+---------+-----------+----------+--------------------+ PTV     Full                                                              +--------+---------------+---------+-----------+----------+--------------------+ PERO    Full                                                              +--------+---------------+---------+-----------+----------+--------------------+ Difficulty compressing CFV/SFJ due to patient bearing down due to discomfort.    Summary: BILATERAL: - No evidence of deep vein thrombosis seen in the lower extremities, bilaterally. -No evidence of popliteal cyst, bilaterally. -Diffuse subcutaneous edema, bilaterally.   *See table(s) above for measurements and observations. Electronically signed by Harold Barban MD on 03/19/2022 at 4:12:23 PM.    Final    US Renal  Result Date: 03/17/2022 CLINICAL DATA:  Acute kidney injury EXAM: RENAL / URINARY TRACT ULTRASOUND COMPLETE COMPARISON:  CT abdomen pelvis 03/17/2022 FINDINGS: Right Kidney: Renal measurements: 10.6 x 3.8 x 4.8 cm = volume: 100 mL. Echogenicity within normal limits. No mass or hydronephrosis visualized. Left Kidney: Renal measurements: 9.6  x 4.6 x 4.3 cm = volume: 98 mL. Echogenicity within normal limits. No mass or hydronephrosis visualized. Bladder: Appears normal for degree of bladder distention. Other: Right pleural effusion noted. IMPRESSION: Decreased renal volume bilaterally.  No obstruction or mass. Right pleural effusion Electronically Signed   By: Franchot Gallo M.D.   On: 03/17/2022 12:21   CT CHEST ABDOMEN PELVIS WO CONTRAST  Result Date: 03/17/2022 CLINICAL DATA:  Abdominal pain, dizziness, vomiting EXAM: CT CHEST, ABDOMEN AND PELVIS WITHOUT CONTRAST TECHNIQUE: Multidetector CT imaging of the chest, abdomen and pelvis was performed following the standard protocol without IV contrast. RADIATION DOSE REDUCTION: This exam was performed according to the departmental dose-optimization program  which includes automated exposure control, adjustment of the mA and/or kV according to patient size and/or use of iterative reconstruction technique. COMPARISON:  CT abdomen and pelvis done on 02/24/2022 FINDINGS: CT CHEST FINDINGS Cardiovascular: There are scattered calcifications seen thoracic aorta and its major branches. Small pericardial effusion is seen. Mediastinum/Nodes: No significant lymphadenopathy is seen. Lungs/Pleura: Small linear densities are seen in the lateral aspect of right upper lung field and posterior aspects of both lower lung fields. Small to moderate bilateral pleural effusions are seen, more so on the right side. There is no pneumothorax. Musculoskeletal: No acute findings are seen. CT ABDOMEN PELVIS FINDINGS Hepatobiliary: No focal abnormalities are seen in liver. There is no dilation of bile ducts. Gallbladder is unremarkable. Pancreas: No focal abnormalities are seen. Spleen: Spleen appears smaller than usual in size. Adrenals/Urinary Tract: There is 1.4 cm low-density nodule in right adrenal with no significant change suggesting possible adenoma. No follow-up is recommended. There is no hydronephrosis. There are no renal or ureteral stones. Urinary bladder is not distended. Stomach/Bowel: Stomach is unremarkable. Small bowel loops are not dilated. Appendix is difficult to visualize. In image 86 of series 2, there is a small caliber tubular structure in right lower quadrant, possibly normal appendix. There is no focal pericecal inflammation. There is no significant wall thickening in colon. There is no pericolic stranding. Vascular/Lymphatic: Scattered arterial calcifications are seen. Reproductive: Uterus is not seen. Other: Small ascites is seen in pelvis. There is no pneumoperitoneum. Umbilical hernia with possible small amount of fluid is noted. There is diffuse edema in subcutaneous plane suggesting anasarca. Musculoskeletal: No acute findings are seen. IMPRESSION:  Small-to-moderate bilateral pleural effusions, more so on the right side. Small pericardial effusion. There are small linear patchy densities in right upper lung field and both lower lung fields suggesting atelectasis. There is no evidence of intestinal obstruction or pneumoperitoneum. There is no hydronephrosis. Small ascites. There is diffuse edema in abdominal wall suggesting anasarca. 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 Scattered calcifications are seen in aorta and major branches. Electronically Signed   By: Elmer Picker M.D.   On: 03/17/2022 11:43   ECHOCARDIOGRAM COMPLETE  Result Date: 03/15/2022    ECHOCARDIOGRAM REPORT   Patient Name:   EMBERLY TOMASSO  Date of Exam: 03/15/2022 Medical Rec #:  [id number]     Height:       62.0 in Accession #:    [id number]    Weight:       137.2 lb Date of Birth:  February 17, 1961     BSA:          1.629 m Patient Age:    34 years      BP:           118/64 mmHg Patient Gender: F  HR:           97 bpm. Exam Location:  [address] Procedure: 2D Echo, Cardiac Doppler and Color Doppler Indications:    R60.9 Edema  History:        Patient has no prior history of Echocardiogram examinations.                 Risk Factors:Hypertension, Former Smoker and Dyslipidemia.                 Lupus. Anemia.  Sonographer:    Basilia Jumbo BS, RDCS Referring Phys: [place]  1. Left ventricular ejection fraction, by estimation, is 60 to 65%. The left ventricle has normal function. The left ventricle has no regional wall motion abnormalities. Left ventricular diastolic parameters were normal.  2. Right ventricular systolic function is normal. The right ventricular size is normal. There is normal pulmonary artery systolic pressure. The estimated right ventricular systolic pressure is 17.4 mmHg.  3. The mitral valve is normal in structure. Mild mitral valve regurgitation. No evidence of mitral stenosis.  4. The aortic valve is  normal in structure. Aortic valve regurgitation is not visualized. No aortic stenosis is present.  5. The inferior vena cava is normal in size with greater than 50% respiratory variability, suggesting right atrial pressure of 3 mmHg.  6. A small pericardial effusion is present. The pericardial effusion is anterior to the right ventricle and localized with largest diameter near the right atrium with compression of the right atrium. Moderate pleural effusion in the left lateral region.  There is < 25% respirophasic change in mitral valve inflow velocities. Normal IVC diameter. Consdier chest CT for further assessment of Right atrial compression. LIkely pericardial effusion but need to rule out other etiologies. FINDINGS  Left Ventricle: Left ventricular ejection fraction, by estimation, is 60 to 65%. The left ventricle has normal function. The left ventricle has no regional wall motion abnormalities. The left ventricular internal cavity size was normal in size. There is  no left ventricular hypertrophy. Left ventricular diastolic parameters were normal. Normal left ventricular filling pressure. Right Ventricle: The right ventricular size is normal. No increase in right ventricular wall thickness. Right ventricular systolic function is normal. There is normal pulmonary artery systolic pressure. The tricuspid regurgitant velocity is 2.48 m/s, and  with an assumed right atrial pressure of 3 mmHg, the estimated right ventricular systolic pressure is 08.1 mmHg. Left Atrium: Left atrial size was normal in size. Right Atrium: Right atrial size was normal in size. Pericardium: A small pericardial effusion is present. The pericardial effusion is anterior to the right ventricle and localized near the right atrium. Mitral Valve: The mitral valve is normal in structure. Mild mitral valve regurgitation. No evidence of mitral valve stenosis. Tricuspid Valve: The tricuspid valve is normal in structure. Tricuspid valve regurgitation  is mild . No evidence of tricuspid stenosis. Aortic Valve: The aortic valve is normal in structure. Aortic valve regurgitation is not visualized. No aortic stenosis is present. Pulmonic Valve: The pulmonic valve was normal in structure. Pulmonic valve regurgitation is not visualized. No evidence of pulmonic stenosis. Aorta: The aortic root is normal in size and structure. Venous: The inferior vena cava is normal in size with greater than 50% respiratory variability, suggesting right atrial pressure of 3 mmHg. IAS/Shunts: No atrial level shunt detected by color flow Doppler. Additional Comments: There is a large pleural effusion in the left lateral region. Mild ascites is present.  LEFT VENTRICLE PLAX  2D LVIDd:         3.40 cm   Diastology LVIDs:         1.90 cm   LV e' medial:    12.60 cm/s LV PW:         1.10 cm   LV E/e' medial:  4.4 LV IVS:        0.80 cm   LV e' lateral:   10.70 cm/s LVOT diam:     1.90 cm   LV E/e' lateral: 5.2 LV SV:         52 LV SV Index:   32 LVOT Area:     2.84 cm  RIGHT VENTRICLE             IVC RV Basal diam:  2.50 cm     IVC diam: 1.20 cm RV S prime:     12.00 cm/s TAPSE (M-mode): 1.6 cm RVSP:           27.6 mmHg LEFT ATRIUM             Index        RIGHT ATRIUM           Index LA diam:        2.40 cm 1.47 cm/m   RA Pressure: 3.00 mmHg LA Vol (A2C):   21.3 ml 13.08 ml/m  RA Area:     6.47 cm LA Vol (A4C):   21.5 ml 13.20 ml/m  RA Volume:   9.71 ml   5.96 ml/m LA Biplane Vol: 21.5 ml 13.20 ml/m  AORTIC VALVE LVOT Vmax:   110.00 cm/s LVOT Vmean:  72.200 cm/s LVOT VTI:    0.182 m  AORTA Ao Root diam: 2.60 cm Ao Asc diam:  2.60 cm MITRAL VALVE               TRICUSPID VALVE                            TR Peak grad:   24.6 mmHg MV Decel Time: 225 msec    TR Vmax:        248.00 cm/s MV E velocity: 55.40 cm/s  Estimated RAP:  3.00 mmHg MV A velocity: 64.40 cm/s  RVSP:           27.6 mmHg MV E/A ratio:  0.86                            SHUNTS                            Systemic VTI:  0.18 m                             Systemic Diam: 1.90 cm Fransico Him MD Electronically signed by Fransico Him MD Signature Date/Time: 03/15/2022/2:06:20 PM    Final     DISCHARGE EXAMINATION: Vitals:   03/27/22 1352 03/27/22 2122 03/28/22 0417 03/28/22 0500  BP:  128/72 114/74   Pulse:  89 78   Resp:  19 18   Temp:  98 F (36.7 C) 97.9 F (36.6 C)   TempSrc:      SpO2:  91% 93%   Weight: 58.4 kg   61.1 kg  Height:       General appearance: Awake alert.  In no  distress Resp: Clear to auscultation bilaterally.  Normal effort Cardio: S1-S2 is normal regular.  No S3-S4.  No rubs murmurs or bruit GI: Abdomen is soft.  Nontender nondistended.  Bowel sounds are present normal.  No masses organomegaly Extremities: Improved edema.  Improved range of motion of the lower extremities and right foot/ankle   DISPOSITION: Home  Discharge Instructions     Call MD for:  difficulty breathing, headache or visual disturbances   Complete by: As directed    Call MD for:  extreme fatigue   Complete by: As directed    Call MD for:  persistant dizziness or light-headedness   Complete by: As directed    Call MD for:  persistant nausea and vomiting   Complete by: As directed    Call MD for:  severe uncontrolled pain   Complete by: As directed    Call MD for:  temperature >100.4   Complete by: As directed    Diet - low sodium heart healthy   Complete by: As directed    Discharge instructions   Complete by: As directed    The kidney doctor will arrange outpatient follow-up within the next 1 week.  Take your medications as prescribed.  You were cared for by a hospitalist during your hospital stay. If you have any questions about your discharge medications or the care you received while you were in the hospital after you are discharged, you can call the unit and asked to speak with the hospitalist on call if the hospitalist that took care of you is not available. Once you are discharged, your primary care  physician will handle any further medical issues. Please note that NO REFILLS for any discharge medications will be authorized once you are discharged, as it is imperative that you return to your primary care physician (or establish a relationship with a primary care physician if you do not have one) for your aftercare needs so that they can reassess your need for medications and monitor your lab values. If you do not have a primary care physician, you can call [phone] for a physician referral.   Increase activity slowly   Complete by: As directed    No wound care   Complete by: As directed           Allergies as of 03/28/2022       Reactions   Codeine Nausea And Vomiting   Hydroxychloroquine Rash        Medication List     STOP taking these medications    buPROPion 150 MG 12 hr tablet Commonly known as: WELLBUTRIN SR   chlorthalidone 25 MG tablet Commonly known as: HYGROTON   Ibuprofen 200 MG Caps   metFORMIN 500 MG tablet Commonly known as: GLUCOPHAGE   naproxen sodium 220 MG tablet Commonly known as: ALEVE   olmesartan 40 MG tablet Commonly known as: BENICAR       TAKE these medications    atorvastatin 40 MG tablet Commonly known as: LIPITOR Take 1 tablet (40 mg total) by mouth daily.   dicyclomine 20 MG tablet Commonly known as: BENTYL Take 10 mg by mouth 3 (three) times daily.   estradiol 0.1 MG/GM vaginal cream Commonly known as: ESTRACE VAGINAL Place 1 g vaginally 3 (three) times a week.   FISH OIL PO Take 1 capsule by mouth in the morning and at bedtime.   folic acid 1 MG tablet Commonly known as: FOLVITE Take 1 mg by mouth in the morning.  furosemide 40 MG tablet Commonly known as: LASIX Take 1 tablet (40 mg total) by mouth 2 (two) times daily for 7 days. What changed: when to take this   gabapentin 300 MG capsule Commonly known as: NEURONTIN Take 1 capsule (300 mg total) by mouth daily.   methotrexate 2.5 MG tablet Take 4 tablets  (10 mg total) by mouth every Wednesday. Please do not resume until cleared to do so by the kidney doctor at follow-up Start taking on: March 30, 2022 What changed: additional instructions   multivitamin tablet Take 1 tablet by mouth daily with breakfast.   omeprazole 40 MG capsule Commonly known as: PRILOSEC Take 40 mg by mouth daily before breakfast.   ondansetron 4 MG tablet Commonly known as: ZOFRAN Take 4 mg by mouth 3 (three) times daily as needed for nausea or vomiting.   oxyCODONE 5 MG immediate release tablet Commonly known as: Oxy IR/ROXICODONE Take 1 tablet (5 mg total) by mouth every 4 (four) hours as needed for moderate pain.   polyethylene glycol 17 g packet Commonly known as: MIRALAX / GLYCOLAX Take 17 g by mouth daily.   predniSONE 10 MG tablet Commonly known as: DELTASONE Take 3 tablets once daily for 3 days followed by 2 tablets once daily for 3 days followed by 1 tablet once daily for 3 days and then stop   VITAMIN C PO Take 1 tablet by mouth daily.          Contact information for follow-up providers     [facility] [place] Follow up.   Why: Office will call to schedule appointment Contact information: [place] [address] [phone]              Contact information for after-discharge care     Destination     [facility] .   Service: Skilled Nursing Contact information: [address] [place] [phone]                     TOTAL DISCHARGE TIME: 33 minutes  [facility] Pager on [email or web address]  03/29/2022, 11:39 AM

## 2022-03-29 ENCOUNTER — Ambulatory Visit: Payer: BC Managed Care – PPO | Admitting: Dietician

## 2022-03-29 DIAGNOSIS — R531 Weakness: Secondary | ICD-10-CM | POA: Diagnosis not present

## 2022-03-31 ENCOUNTER — Encounter (HOSPITAL_COMMUNITY): Payer: Self-pay

## 2022-03-31 ENCOUNTER — Ambulatory Visit: Payer: BC Managed Care – PPO | Admitting: Registered Nurse

## 2022-04-04 ENCOUNTER — Encounter: Payer: BC Managed Care – PPO | Admitting: Registered"

## 2022-04-04 DIAGNOSIS — Z720 Tobacco use: Secondary | ICD-10-CM | POA: Diagnosis not present

## 2022-04-05 DIAGNOSIS — Z720 Tobacco use: Secondary | ICD-10-CM | POA: Diagnosis not present

## 2022-04-06 ENCOUNTER — Emergency Department (HOSPITAL_COMMUNITY): Payer: BC Managed Care – PPO

## 2022-04-06 ENCOUNTER — Encounter (HOSPITAL_COMMUNITY): Payer: Self-pay | Admitting: Pharmacy Technician

## 2022-04-06 ENCOUNTER — Other Ambulatory Visit: Payer: Self-pay

## 2022-04-06 ENCOUNTER — Emergency Department (HOSPITAL_BASED_OUTPATIENT_CLINIC_OR_DEPARTMENT_OTHER): Payer: BC Managed Care – PPO

## 2022-04-06 ENCOUNTER — Inpatient Hospital Stay (HOSPITAL_COMMUNITY)
Admission: EM | Admit: 2022-04-06 | Discharge: 2022-04-11 | DRG: 392 | Disposition: A | Discharge status: Home / Self Care | Payer: BC Managed Care – PPO | Attending: Internal Medicine | Admitting: Internal Medicine

## 2022-04-06 DIAGNOSIS — Z888 Allergy status to other drugs, medicaments and biological substances status: Secondary | ICD-10-CM

## 2022-04-06 DIAGNOSIS — D72829 Elevated white blood cell count, unspecified: Secondary | ICD-10-CM | POA: Diagnosis present

## 2022-04-06 DIAGNOSIS — M069 Rheumatoid arthritis, unspecified: Secondary | ICD-10-CM | POA: Diagnosis not present

## 2022-04-06 DIAGNOSIS — Z8249 Family history of ischemic heart disease and other diseases of the circulatory system: Secondary | ICD-10-CM

## 2022-04-06 DIAGNOSIS — K219 Gastro-esophageal reflux disease without esophagitis: Secondary | ICD-10-CM | POA: Diagnosis not present

## 2022-04-06 DIAGNOSIS — Z79899 Other long term (current) drug therapy: Secondary | ICD-10-CM

## 2022-04-06 DIAGNOSIS — Z825 Family history of asthma and other chronic lower respiratory diseases: Secondary | ICD-10-CM | POA: Diagnosis not present

## 2022-04-06 DIAGNOSIS — K449 Diaphragmatic hernia without obstruction or gangrene: Secondary | ICD-10-CM | POA: Diagnosis not present

## 2022-04-06 DIAGNOSIS — E119 Type 2 diabetes mellitus without complications: Secondary | ICD-10-CM | POA: Diagnosis not present

## 2022-04-06 DIAGNOSIS — D72823 Leukemoid reaction: Secondary | ICD-10-CM

## 2022-04-06 DIAGNOSIS — D75838 Other thrombocytosis: Secondary | ICD-10-CM | POA: Diagnosis present

## 2022-04-06 DIAGNOSIS — R112 Nausea with vomiting, unspecified: Secondary | ICD-10-CM | POA: Diagnosis not present

## 2022-04-06 DIAGNOSIS — R188 Other ascites: Secondary | ICD-10-CM | POA: Diagnosis not present

## 2022-04-06 DIAGNOSIS — R601 Generalized edema: Secondary | ICD-10-CM | POA: Diagnosis not present

## 2022-04-06 DIAGNOSIS — J9811 Atelectasis: Secondary | ICD-10-CM | POA: Diagnosis not present

## 2022-04-06 DIAGNOSIS — Z87891 Personal history of nicotine dependence: Secondary | ICD-10-CM

## 2022-04-06 DIAGNOSIS — I10 Essential (primary) hypertension: Secondary | ICD-10-CM | POA: Diagnosis not present

## 2022-04-06 DIAGNOSIS — D509 Iron deficiency anemia, unspecified: Secondary | ICD-10-CM | POA: Diagnosis present

## 2022-04-06 DIAGNOSIS — R1013 Epigastric pain: Principal | ICD-10-CM

## 2022-04-06 DIAGNOSIS — E872 Acidosis, unspecified: Secondary | ICD-10-CM | POA: Diagnosis present

## 2022-04-06 DIAGNOSIS — E876 Hypokalemia: Secondary | ICD-10-CM | POA: Diagnosis present

## 2022-04-06 DIAGNOSIS — L986 Other infiltrative disorders of the skin and subcutaneous tissue: Secondary | ICD-10-CM | POA: Diagnosis not present

## 2022-04-06 DIAGNOSIS — E1165 Type 2 diabetes mellitus with hyperglycemia: Secondary | ICD-10-CM | POA: Diagnosis present

## 2022-04-06 DIAGNOSIS — E860 Dehydration: Secondary | ICD-10-CM | POA: Diagnosis not present

## 2022-04-06 DIAGNOSIS — E11649 Type 2 diabetes mellitus with hypoglycemia without coma: Secondary | ICD-10-CM | POA: Diagnosis not present

## 2022-04-06 DIAGNOSIS — Z79631 Long term (current) use of antimetabolite agent: Secondary | ICD-10-CM

## 2022-04-06 DIAGNOSIS — E118 Type 2 diabetes mellitus with unspecified complications: Secondary | ICD-10-CM | POA: Diagnosis present

## 2022-04-06 DIAGNOSIS — G8929 Other chronic pain: Secondary | ICD-10-CM | POA: Diagnosis present

## 2022-04-06 DIAGNOSIS — E785 Hyperlipidemia, unspecified: Secondary | ICD-10-CM | POA: Diagnosis present

## 2022-04-06 DIAGNOSIS — M7989 Other specified soft tissue disorders: Secondary | ICD-10-CM

## 2022-04-06 DIAGNOSIS — R1010 Upper abdominal pain, unspecified: Secondary | ICD-10-CM | POA: Diagnosis not present

## 2022-04-06 DIAGNOSIS — R079 Chest pain, unspecified: Secondary | ICD-10-CM | POA: Diagnosis not present

## 2022-04-06 DIAGNOSIS — Z885 Allergy status to narcotic agent status: Secondary | ICD-10-CM | POA: Diagnosis not present

## 2022-04-06 DIAGNOSIS — R1114 Bilious vomiting: Secondary | ICD-10-CM

## 2022-04-06 DIAGNOSIS — D75839 Thrombocytosis, unspecified: Secondary | ICD-10-CM

## 2022-04-06 DIAGNOSIS — J90 Pleural effusion, not elsewhere classified: Secondary | ICD-10-CM | POA: Diagnosis not present

## 2022-04-06 DIAGNOSIS — R109 Unspecified abdominal pain: Secondary | ICD-10-CM | POA: Diagnosis present

## 2022-04-06 DIAGNOSIS — Z833 Family history of diabetes mellitus: Secondary | ICD-10-CM | POA: Diagnosis not present

## 2022-04-06 DIAGNOSIS — I700 Atherosclerosis of aorta: Secondary | ICD-10-CM | POA: Diagnosis not present

## 2022-04-06 DIAGNOSIS — E278 Other specified disorders of adrenal gland: Secondary | ICD-10-CM | POA: Diagnosis not present

## 2022-04-06 DIAGNOSIS — R10816 Epigastric abdominal tenderness: Secondary | ICD-10-CM | POA: Diagnosis not present

## 2022-04-06 DIAGNOSIS — N179 Acute kidney failure, unspecified: Secondary | ICD-10-CM | POA: Diagnosis not present

## 2022-04-06 LAB — LACTIC ACID, PLASMA
Lactic Acid, Venous: 3.9 mmol/L (ref 0.5–1.9)
Lactic Acid, Venous: 2.9 mmol/L (ref 0.5–1.9)

## 2022-04-06 LAB — URINALYSIS, ROUTINE W REFLEX MICROSCOPIC
Bilirubin Urine: NEGATIVE
Glucose, UA: NEGATIVE mg/dL
Hgb urine dipstick: NEGATIVE
Ketones, ur: NEGATIVE mg/dL
Nitrite: NEGATIVE
Protein, ur: NEGATIVE mg/dL
Specific Gravity, Urine: 1.013 (ref 1.005–1.030)
pH: 8 (ref 5.0–8.0)

## 2022-04-06 LAB — CBC
HCT: 41.2 % (ref 36.0–46.0)
Hemoglobin: 14.1 g/dL (ref 12.0–15.0)
MCH: 33 pg (ref 26.0–34.0)
MCHC: 34.2 g/dL (ref 30.0–36.0)
MCV: 96.5 fL (ref 80.0–100.0)
Platelets: 1150 10*3/uL (ref 150–400)
RBC: 4.27 MIL/uL (ref 3.87–5.11)
RDW: 15.7 % — ABNORMAL HIGH (ref 11.5–15.5)
WBC: 22.9 10*3/uL — ABNORMAL HIGH (ref 4.0–10.5)
nRBC: 0 % (ref 0.0–0.2)

## 2022-04-06 LAB — DIFFERENTIAL
Abs Immature Granulocytes: 0.21 10*3/uL — ABNORMAL HIGH (ref 0.00–0.07)
Basophils Absolute: 0.1 10*3/uL (ref 0.0–0.1)
Basophils Relative: 0 %
Eosinophils Absolute: 0.1 10*3/uL (ref 0.0–0.5)
Eosinophils Relative: 0 %
Immature Granulocytes: 1 %
Lymphocytes Relative: 6 %
Lymphs Abs: 1.3 10*3/uL (ref 0.7–4.0)
Monocytes Absolute: 1.8 10*3/uL — ABNORMAL HIGH (ref 0.1–1.0)
Monocytes Relative: 8 %
Neutro Abs: 19.7 10*3/uL — ABNORMAL HIGH (ref 1.7–7.7)
Neutrophils Relative %: 85 %

## 2022-04-06 LAB — COMPREHENSIVE METABOLIC PANEL
ALT: 108 U/L — ABNORMAL HIGH (ref 0–44)
AST: 52 U/L — ABNORMAL HIGH (ref 15–41)
Albumin: 3.8 g/dL (ref 3.5–5.0)
Alkaline Phosphatase: 61 U/L (ref 38–126)
Anion gap: 13 (ref 5–15)
BUN: 25 mg/dL — ABNORMAL HIGH (ref 8–23)
CO2: 34 mmol/L — ABNORMAL HIGH (ref 22–32)
Calcium: 9.5 mg/dL (ref 8.9–10.3)
Chloride: 89 mmol/L — ABNORMAL LOW (ref 98–111)
Creatinine, Ser: 1.05 mg/dL — ABNORMAL HIGH (ref 0.44–1.00)
GFR, Estimated: >60 mL/min (ref >60)
Glucose, Bld: 186 mg/dL — ABNORMAL HIGH (ref 70–99)
Potassium: 3.2 mmol/L — ABNORMAL LOW (ref 3.5–5.1)
Sodium: 136 mmol/L (ref 135–145)
Total Bilirubin: 0.6 mg/dL (ref 0.3–1.2)
Total Protein: 6.8 g/dL (ref 6.5–8.1)

## 2022-04-06 LAB — GLUCOSE, CAPILLARY
Glucose-Capillary: 84 mg/dL (ref 70–99)
Glucose-Capillary: 157 mg/dL — ABNORMAL HIGH (ref 70–99)

## 2022-04-06 LAB — LIPASE, BLOOD: Lipase: 37 U/L (ref 11–51)

## 2022-04-06 LAB — LACTATE DEHYDROGENASE: LDH: 203 U/L — ABNORMAL HIGH (ref 98–192)

## 2022-04-06 MED ORDER — DICYCLOMINE HCL 20 MG PO TABS
10.0000 mg | ORAL_TABLET | Freq: Three times a day (TID) | ORAL | Status: DC
  Administered 2022-04-06 – 2022-04-11 (×12): 10 mg via ORAL
  Filled 2022-04-06 (×14): qty 1

## 2022-04-06 MED ORDER — IOHEXOL 300 MG/ML  SOLN
100.0000 mL | Freq: Once | INTRAMUSCULAR | Status: AC | PRN
  Administered 2022-04-06: 100 mL via INTRAVENOUS

## 2022-04-06 MED ORDER — FAMOTIDINE IN NACL 20-0.9 MG/50ML-% IV SOLN
20.0000 mg | Freq: Two times a day (BID) | INTRAVENOUS | Status: DC
  Administered 2022-04-06 – 2022-04-08 (×4): 20 mg via INTRAVENOUS
  Filled 2022-04-06 (×4): qty 50

## 2022-04-06 MED ORDER — SODIUM CHLORIDE 0.9 % IV BOLUS
1000.0000 mL | Freq: Once | INTRAVENOUS | Status: AC
Start: 2022-04-06 — End: 2022-04-06
  Administered 2022-04-06: 1000 mL via INTRAVENOUS

## 2022-04-06 MED ORDER — FENTANYL CITRATE PF 50 MCG/ML IJ SOSY
50.0000 ug | PREFILLED_SYRINGE | Freq: Once | INTRAMUSCULAR | Status: AC
  Administered 2022-04-06: 50 ug via INTRAVENOUS
  Filled 2022-04-06: qty 1

## 2022-04-06 MED ORDER — ONDANSETRON HCL 4 MG PO TABS: 4.0000 mg | ORAL_TABLET | Freq: Four times a day (QID) | ORAL | Status: DC | PRN

## 2022-04-06 MED ORDER — INSULIN ASPART 100 UNIT/ML IJ SOLN
0.0000 [IU] | Freq: Every day | INTRAMUSCULAR | Status: DC
  Administered 2022-04-07: 2 [IU] via SUBCUTANEOUS
  Filled 2022-04-06: qty 0.05

## 2022-04-06 MED ORDER — NICOTINE 14 MG/24HR TD PT24
14.0000 mg | MEDICATED_PATCH | Freq: Every day | TRANSDERMAL | Status: DC
  Administered 2022-04-09: 14 mg via TRANSDERMAL
  Filled 2022-04-06 (×7): qty 1

## 2022-04-06 MED ORDER — GABAPENTIN 300 MG PO CAPS
300.0000 mg | ORAL_CAPSULE | Freq: Every day | ORAL | Status: DC
  Administered 2022-04-06 – 2022-04-11 (×4): 300 mg via ORAL
  Filled 2022-04-06 (×6): qty 1

## 2022-04-06 MED ORDER — INSULIN ASPART 100 UNIT/ML IJ SOLN
0.0000 [IU] | Freq: Three times a day (TID) | INTRAMUSCULAR | Status: DC
  Administered 2022-04-08: 1 [IU] via SUBCUTANEOUS
  Administered 2022-04-09: 2 [IU] via SUBCUTANEOUS
  Administered 2022-04-09: 3 [IU] via SUBCUTANEOUS
  Administered 2022-04-10 (×2): 2 [IU] via SUBCUTANEOUS
  Filled 2022-04-06: qty 0.09

## 2022-04-06 MED ORDER — LACTATED RINGERS IV BOLUS
1000.0000 mL | Freq: Once | INTRAVENOUS | Status: AC
  Administered 2022-04-06: 1000 mL via INTRAVENOUS

## 2022-04-06 MED ORDER — ONDANSETRON HCL 4 MG/2ML IJ SOLN
4.0000 mg | Freq: Once | INTRAMUSCULAR | Status: AC
  Administered 2022-04-06: 4 mg via INTRAVENOUS
  Filled 2022-04-06: qty 2

## 2022-04-06 MED ORDER — HEPARIN SODIUM (PORCINE) 5000 UNIT/ML IJ SOLN
5000.0000 [IU] | Freq: Three times a day (TID) | INTRAMUSCULAR | Status: DC
  Administered 2022-04-06 – 2022-04-11 (×14): 5000 [IU] via SUBCUTANEOUS
  Filled 2022-04-06 (×15): qty 1

## 2022-04-06 MED ORDER — FAMOTIDINE IN NACL 20-0.9 MG/50ML-% IV SOLN
20.0000 mg | Freq: Once | INTRAVENOUS | Status: AC
  Administered 2022-04-06: 20 mg via INTRAVENOUS
  Filled 2022-04-06: qty 50

## 2022-04-06 MED ORDER — ACETAMINOPHEN 650 MG RE SUPP: 650.0000 mg | Freq: Four times a day (QID) | RECTAL | Status: DC | PRN

## 2022-04-06 MED ORDER — FENTANYL CITRATE PF 50 MCG/ML IJ SOSY
12.5000 ug | PREFILLED_SYRINGE | INTRAMUSCULAR | Status: DC | PRN
  Administered 2022-04-06 – 2022-04-07 (×2): 50 ug via INTRAVENOUS
  Administered 2022-04-07: 25 ug via INTRAVENOUS
  Administered 2022-04-08 (×2): 50 ug via INTRAVENOUS
  Administered 2022-04-09: 12.5 ug via INTRAVENOUS
  Filled 2022-04-06 (×6): qty 1

## 2022-04-06 MED ORDER — POTASSIUM CHLORIDE 2 MEQ/ML IV SOLN
INTRAVENOUS | Status: DC
  Filled 2022-04-06 (×2): qty 1000

## 2022-04-06 MED ORDER — ACETAMINOPHEN 325 MG PO TABS: 650.0000 mg | ORAL_TABLET | Freq: Four times a day (QID) | ORAL | Status: DC | PRN

## 2022-04-06 MED ORDER — KCL-LACTATED RINGERS 20 MEQ/L IV SOLN
INTRAVENOUS | Status: DC
  Filled 2022-04-06: qty 1000

## 2022-04-06 MED ORDER — ONDANSETRON HCL 4 MG/2ML IJ SOLN: 4.0000 mg | Freq: Four times a day (QID) | INTRAMUSCULAR | Status: DC | PRN

## 2022-04-06 NOTE — Progress Notes (Signed)
LUE venous duplex has been completed.  Preliminary results given to Dr. Vanita Panda.   Results can be found under chart review under CV PROC. 04/06/2022 1:57 PM Akaash Vandewater RVT, RDMS

## 2022-04-06 NOTE — ED Triage Notes (Signed)
Pt bib family with complaints of ongoing epigastric pain. Recently admitted for same. Pt with associated emesis, states unable to tolerate po intake. Pt also with L arm edema and bil lower extremity edema.

## 2022-04-06 NOTE — H&P (Signed)
History and Physical    Alyssa Rhodes ZYY:[id number] DOB: 1960/08/24 DOA: 04/06/2022  PCP: Doretha Sou, MD  Patient coming from: Home  I have personally briefly reviewed patient's old medical records available.   Chief Complaint: Abdominal pain for many months, worse for last 5 days  HPI: Alyssa Rhodes is a 61 y.o. female with medical history significant of type 2 diabetes, recently taken off metformin and currently not on any treatment, essential hypertension, hyperlipidemia, rheumatoid arthritis on methotrexate, anasarca, recent admission for renal failure comes to the emergency room with intermittent, severe 10 out of 10, epigastric pain associated with nausea, no radiation, no relation to meals.  She had this pain going on for many months now, recently worsening.  Since last 5 days she had these episodes about once or twice that were so severe she gets dizzy lightheaded and even passes out with pain.  Bowel movements have been normal.  She is on omeprazole.  Never had endoscopy evaluation. Patient was treated with iron transfusions 2 years ago.  Recently Cologuard was done and that was normal.  She never had endoluminal evaluation. CT scan on 8/24 with moderate bilateral pleural effusion consistent with anasarca.  No intra-abdominal pathology. CT scans today with similar appearing exam, does have clinical anasarca. Recently completed prednisone taper for ankle pain.  Has been taking methotrexate with rheumatologist. ED Course: On room air.  Blood pressure stable.  WBC and platelets greatly elevated compared to recent admission.  Lactic acid is 3.9.  Potassium 3.2, renal functions almost back to her normal self.  CT scan as above.  Due to severe abdominal pain, intolerance to diet, diagnostic dilemma admission was requested.  She got some relief with 1 dose of fentanyl and Pepcid.  Review of Systems: all systems are reviewed and pertinent positive as per HPI otherwise rest are  negative.    Past Medical History:  Diagnosis Date   Diabetes mellitus without complication ([place])    Hyperlipidemia    Hypertension    Lupus ([place])    Thyroid nodule 03/2010   aspiration    Past Surgical History:  Procedure Laterality Date   ABDOMINAL HYSTERECTOMY     CESAREAN SECTION     SHOULDER SURGERY  2011   LEFT    Social history   reports that she has quit smoking. Her smoking use included cigarettes. She has a 41.00 pack-year smoking history. She has never used smokeless tobacco. She reports that she does not currently use alcohol after a past usage of about 3.0 standard drinks of alcohol per week. She reports that she does not use drugs.  Allergies  Allergen Reactions   Codeine Nausea And Vomiting   Hydroxychloroquine Rash    Family History  Problem Relation Age of Onset   Asthma Mother        doe not know mother well, sister advised her   Diabetes Mother    Heart disease Father        heart attack   Hypertension Father      Prior to Admission medications   Medication Sig Start Date End Date Taking? Authorizing Provider  Ascorbic Acid (VITAMIN C PO) Take 1 tablet by mouth daily.    [provider]  atorvastatin (LIPITOR) 40 MG tablet Take 1 tablet (40 mg total) by mouth daily. 12/30/21   Maximiano Coss, NP  dicyclomine (BENTYL) 20 MG tablet Take 10 mg by mouth 3 (three) times daily. 03/12/22   [provider]  estradiol (ESTRACE  VAGINAL) 0.1 MG/GM vaginal cream Place 1 g vaginally 3 (three) times a week. 01/12/22   Chrzanowski, Jami B, NP  folic acid (FOLVITE) 1 MG tablet Take 1 mg by mouth in the morning. 02/27/19   [provider]  furosemide (LASIX) 40 MG tablet Take 1 tablet (40 mg total) by mouth 2 (two) times daily for 7 days. 03/28/22 04/04/22  Bonnielee Haff, MD  gabapentin (NEURONTIN) 300 MG capsule Take 1 capsule (300 mg total) by mouth daily. 03/29/22   Bonnielee Haff, MD  methotrexate 2.5 MG tablet Take 4 tablets (10 mg total) by  mouth every Wednesday. Please do not resume until cleared to do so by the kidney doctor at follow-up 03/30/22   Bonnielee Haff, MD  Multiple Vitamin (MULTIVITAMIN) tablet Take 1 tablet by mouth daily with breakfast.    [provider]  Omega-3 Fatty Acids (FISH OIL PO) Take 1 capsule by mouth in the morning and at bedtime.    [provider]  omeprazole (PRILOSEC) 40 MG capsule Take 40 mg by mouth daily before breakfast. 03/04/22   [provider]  ondansetron (ZOFRAN) 4 MG tablet Take 4 mg by mouth 3 (three) times daily as needed for nausea or vomiting. 03/12/22   [provider]  oxyCODONE (OXY IR/ROXICODONE) 5 MG immediate release tablet Take 1 tablet (5 mg total) by mouth every 4 (four) hours as needed for moderate pain. 03/28/22   Bonnielee Haff, MD  polyethylene glycol (MIRALAX / GLYCOLAX) 17 g packet Take 17 g by mouth daily. 03/29/22   Bonnielee Haff, MD  predniSONE (DELTASONE) 10 MG tablet Take 3 tablets once daily for 3 days followed by 2 tablets once daily for 3 days followed by 1 tablet once daily for 3 days and then stop 03/28/22   Bonnielee Haff, MD    Physical Exam: Vitals:   04/06/22 1400 04/06/22 1430 04/06/22 1500 04/06/22 1501  BP: 103/80 113/68 123/74 123/74  Pulse: 94 89 88   Resp: '16 16 16 16  '$ Temp: 98 F (36.7 C)     TempSrc:      SpO2: 100% 100% 100%     Constitutional: NAD, calm, comfortable at this time, slightly anxious. Vitals:   04/06/22 1400 04/06/22 1430 04/06/22 1500 04/06/22 1501  BP: 103/80 113/68 123/74 123/74  Pulse: 94 89 88   Resp: '16 16 16 16  '$ Temp: 98 F (36.7 C)     TempSrc:      SpO2: 100% 100% 100%    Eyes: PERRL, lids and conjunctivae normal ENMT: Mucous membranes are moist. Posterior pharynx clear of any exudate or lesions.Normal dentition.  Neck: normal, supple, no masses, no thyromegaly Respiratory: clear to auscultation bilaterally, no wheezing, no crackles. Normal respiratory effort. No accessory muscle  use.  Cardiovascular: Regular rate and rhythm, no murmurs / rubs / gallops.  2+ bilateral pedal edema. 2+ pedal pulses. No carotid bruits.  Abdomen: Mild epigastric tenderness on deep palpation, no masses palpated. No hepatosplenomegaly. Bowel sounds positive.  Musculoskeletal: no clubbing / cyanosis. No joint deformity upper and lower extremities. Good ROM, no contractures. Normal muscle tone.  Skin: no rashes, lesions, ulcers. No induration Neurologic: CN 2-12 grossly intact. Sensation intact, DTR normal. Strength 5/5 in all 4.  Psychiatric: Normal judgment and insight. Alert and oriented x 3.  Anxious.    Labs on Admission: I have personally reviewed following labs and imaging studies  CBC: Recent Labs  Lab 04/06/22 1110  WBC 22.9*  NEUTROABS 19.7*  HGB  14.1  HCT 41.2  MCV 96.5  PLT 0,086*   Basic Metabolic Panel: Recent Labs  Lab 04/06/22 1110  NA 136  K 3.2*  CL 89*  CO2 34*  GLUCOSE 186*  BUN 25*  CREATININE 1.05*  CALCIUM 9.5   GFR: Estimated Creatinine Clearance: 48.4 mL/min (A) (by C-G formula based on SCr of 1.05 mg/dL (H)). Liver Function Tests: Recent Labs  Lab 04/06/22 1110  AST 52*  ALT 108*  ALKPHOS 61  BILITOT 0.6  PROT 6.8  ALBUMIN 3.8   Recent Labs  Lab 04/06/22 1110  LIPASE 37   No results for input(s): "AMMONIA" in the last 168 hours. Coagulation Profile: No results for input(s): "INR", "PROTIME" in the last 168 hours. Cardiac Enzymes: No results for input(s): "CKTOTAL", "CKMB", "CKMBINDEX", "TROPONINI" in the last 168 hours. BNP (last 3 results) No results for input(s): "PROBNP" in the last 8760 hours. HbA1C: No results for input(s): "HGBA1C" in the last 72 hours. CBG: No results for input(s): "GLUCAP" in the last 168 hours. Lipid Profile: No results for input(s): "CHOL", "HDL", "LDLCALC", "TRIG", "CHOLHDL", "LDLDIRECT" in the last 72 hours. Thyroid Function Tests: No results for input(s): "TSH", "T4TOTAL", "FREET4", "T3FREE",  "THYROIDAB" in the last 72 hours. Anemia Panel: No results for input(s): "VITAMINB12", "FOLATE", "FERRITIN", "TIBC", "IRON", "RETICCTPCT" in the last 72 hours. Urine analysis:    Component Value Date/Time   COLORURINE YELLOW 04/06/2022 1040   APPEARANCEUR CLEAR 04/06/2022 1040   LABSPEC 1.013 04/06/2022 1040   PHURINE 8.0 04/06/2022 1040   GLUCOSEU NEGATIVE 04/06/2022 1040   HGBUR NEGATIVE 04/06/2022 1040   BILIRUBINUR NEGATIVE 04/06/2022 1040   BILIRUBINUR negative 11/24/2016 1637   KETONESUR NEGATIVE 04/06/2022 1040   PROTEINUR NEGATIVE 04/06/2022 1040   UROBILINOGEN 0.2 11/24/2016 1637   NITRITE NEGATIVE 04/06/2022 1040   LEUKOCYTESUR SMALL (A) 04/06/2022 1040    Radiological Exams on Admission: UE VENOUS DUPLEX (7am - 7pm)  Result Date: 04/06/2022 UPPER VENOUS STUDY  Patient Name:  Alyssa Rhodes  Date of Exam:   04/06/2022 Medical Rec #: [id number]     Accession #:    [id number] Date of Birth: 02-04-1961     Patient Gender: F Patient Age:   82 years Exam Location:  [facility] Procedure:      VAS [place] UPPER EXTREMITY VENOUS DUPLEX Referring Phys: Herbie Baltimore LOCKWOOD --------------------------------------------------------------------------------  Indications: Swelling Comparison Study: Previous exam on 03/22/22 was negative for DVT. Performing Technologist: Rogelia Rohrer RVT, RDMS  Examination Guidelines: A complete evaluation includes B-mode imaging, spectral Doppler, color Doppler, and power Doppler as needed of all accessible portions of each vessel. Bilateral testing is considered an integral part of a complete examination. Limited examinations for reoccurring indications may be performed as noted.  Right Findings: +----------+------------+---------+-----------+----------+-------+ RIGHT     CompressiblePhasicitySpontaneousPropertiesSummary +----------+------------+---------+-----------+----------+-------+ Subclavian    Full       Yes       Yes                       +----------+------------+---------+-----------+----------+-------+  Left Findings: +----------+------------+---------+-----------+----------+-------+ LEFT      CompressiblePhasicitySpontaneousPropertiesSummary +----------+------------+---------+-----------+----------+-------+ IJV           Full       Yes       Yes                      +----------+------------+---------+-----------+----------+-------+ Subclavian    Full       Yes  Yes                      +----------+------------+---------+-----------+----------+-------+ Axillary      Full       Yes       Yes                      +----------+------------+---------+-----------+----------+-------+ Brachial      Full       Yes       Yes                      +----------+------------+---------+-----------+----------+-------+ Radial        Full                                          +----------+------------+---------+-----------+----------+-------+ Ulnar         Full                                          +----------+------------+---------+-----------+----------+-------+ Cephalic      Full                                          +----------+------------+---------+-----------+----------+-------+ Basilic       Full       Yes       No                       +----------+------------+---------+-----------+----------+-------+  Summary:  Right: No evidence of thrombosis in the subclavian.  Left: No evidence of deep vein thrombosis in the upper extremity. No evidence of superficial vein thrombosis in the upper extremity. Subcutaneous edema seen in distal upper arm and forearm.  *See table(s) above for measurements and observations.    Preliminary    DG Chest 2 View  Result Date: 04/06/2022 CLINICAL DATA:  Epigastric pain with emesis. EXAM: CHEST - 2 VIEW COMPARISON:  Chest radiograph 03/20/2010 FINDINGS: The cardiomediastinal silhouette is normal. There are small bilateral pleural effusions with minimal  adjacent atelectasis. There is no other focal airspace disease. There is no pulmonary edema. There is no pleural effusion or pneumothorax There is no acute osseous abnormality. IMPRESSION: Small bilateral pleural effusions. Electronically Signed   By: Valetta Mole M.D.   On: 04/06/2022 13:02   CT Abdomen Pelvis W Contrast  Result Date: 04/06/2022 CLINICAL DATA:  Nausea vomiting, abdominal pain.  Epigastric pain. EXAM: CT ABDOMEN AND PELVIS WITH CONTRAST TECHNIQUE: Multidetector CT imaging of the abdomen and pelvis was performed using the standard protocol following bolus administration of intravenous contrast. RADIATION DOSE REDUCTION: This exam was performed according to the departmental dose-optimization program which includes automated exposure control, adjustment of the mA and/or kV according to patient size and/or use of iterative reconstruction technique. CONTRAST:  179m OMNIPAQUE IOHEXOL 300 MG/ML  SOLN COMPARISON:  CT March 17, 2022. FINDINGS: Lower chest: Similar small to moderate bilateral pleural effusions with adjacent relaxation atelectasis. Hepatobiliary: No suspicious hepatic lesion. Gallbladder is unremarkable. No biliary ductal dilation. Pancreas: No pancreatic ductal dilation or evidence of acute inflammation. Spleen: No splenomegaly or focal splenic lesion. Adrenals/Urinary Tract: Stable 1.4 and 1.2 cm right adrenal nodules  favored to reflect benign adrenal adenomas but technically incompletely characterize. Left adrenal gland appears normal. No hydronephrosis. Kidneys demonstrate symmetric enhancement and excretion of contrast material. Urinary bladder is unremarkable for degree of distension. Stomach/Bowel: No radiopaque enteric contrast material was administered. Stomach is unremarkable for degree of distension. No pathologic dilation of small or large bowel. The appendix and terminal ileum appear normal. No evidence of acute bowel inflammation. Vascular/Lymphatic: Aortic atherosclerosis.  No pathologically enlarged abdominal or pelvic lymph nodes. Reproductive: Status post hysterectomy. No adnexal masses. Other: Similar diffuse subcutaneous edema with small volume abdominopelvic ascites. Musculoskeletal: No acute osseous abnormality. IMPRESSION: 1. Similar diffuse subcutaneous edema, small volume abdominopelvic ascites and bilateral pleural effusions suggestive of third spacing. 2. No new abnormal finding in the abdomen or pelvis. 3. Stable right adrenal nodules measuring up to 1.4 cm in favored to reflect benign adrenal adenomas, suggest more definitive characterization with nonemergent adrenal protocol CT with and without contrast. 4.  Aortic Atherosclerosis (ICD10-I70.0). Electronically Signed   By: Dahlia Bailiff M.D.   On: 04/06/2022 13:01     Assessment/Plan Principal Problem:   Abdominal pain Active Problems:   HTN (hypertension)   Microcytic anemia   Reactive thrombocytosis   Leucocytosis   Anasarca   Type 2 diabetes mellitus with complication, without long-term current use of insulin ([place])     1.  Recurrent abdominal pain: Chronic uninvestigated dyspepsia.  Acute on chronic pain. Differential diagnosis includes gallbladder disease, peptic ulcer disease, mesenteric ischemia. Patient with history of iron deficiency anemia, dyspepsia, will need endoscopic evaluation.  -Adequate pain medications.  Continue Pepcid IV twice daily.  Discussed with gastroenterology and anticipate upper GI endoscopy tomorrow afternoon. -If upper GI endoscopy is negative, will proceed with HIDA scan after some break with opiates. -If upper GI endoscopy and HIDA scan unremarkable, will proceed with CT angiogram of the abdomen pelvis.  We will keep this as last investigation to avoid worsening renal functions.  2.  Anasarca: Urine without protein.  Unknown whether this is related to rheumatological disorder that she is suffering from.  IV fluids today.  If adequate renal functions, will start  gentle diuresis for symptom relief.  3.  Thrombocytosis, leukocytosis and lactic acidosis: Due to dehydration and hemoconcentration.  Gentle IV fluid overnight and recheck levels.  4.  Type 2 diabetes with hyperglycemia: Used to be on metformin.  Known A1c of 7.5.  Taken off all medications last admission. Keep on sliding scale insulin.  5.  Rheumatoid  arthritis: Followed by rheumatology.  Hold methotrexate.    DVT prophylaxis: Heparin subcu Code Status: Full code Family Communication: None at the bedside Disposition Plan: Home once stable Consults called: Gastroenterology, called and discussed with Dr. Watt Climes Admission status: Observation, MedSurg bed.   Barb Merino MD [facility] Pager [phone]

## 2022-04-06 NOTE — ED Provider Notes (Signed)
[facility] DEPT Provider Note   CSN: [id number] Arrival date & time: 04/06/22  1003     History  Chief Complaint  Patient presents with   Abdominal Pain    Alyssa Rhodes is a 61 y.o. female.  HPI Patient presents with her son who assists with the history.  Additional details are obtained on chart review.  Patient was seen, evaluated, admitted 1 week ago for abdominal pain.  She notes that she has been dealing with pain for some time, it has become worse, and following discharge 1 week ago she was transiently better, but over the past few days has been weaker due to p.o. intolerance secondary to pain, nausea, vomiting.  No new fever.  No sick contacts.  She has no history of malignancy, does have a history of either lupus or rheumatoid, though exact diagnosis is unclear to her.    Home Medications Prior to Admission medications   Medication Sig Start Date End Date Taking? Authorizing Provider  Ascorbic Acid (VITAMIN C PO) Take 1 tablet by mouth daily.    [provider]  atorvastatin (LIPITOR) 40 MG tablet Take 1 tablet (40 mg total) by mouth daily. 12/30/21   Maximiano Coss, NP  dicyclomine (BENTYL) 20 MG tablet Take 10 mg by mouth 3 (three) times daily. 03/12/22   [provider]  estradiol (ESTRACE VAGINAL) 0.1 MG/GM vaginal cream Place 1 g vaginally 3 (three) times a week. 01/12/22   Chrzanowski, Jami B, NP  folic acid (FOLVITE) 1 MG tablet Take 1 mg by mouth in the morning. 02/27/19   [provider]  furosemide (LASIX) 40 MG tablet Take 1 tablet (40 mg total) by mouth 2 (two) times daily for 7 days. 03/28/22 04/04/22  Bonnielee Haff, MD  gabapentin (NEURONTIN) 300 MG capsule Take 1 capsule (300 mg total) by mouth daily. 03/29/22   Bonnielee Haff, MD  methotrexate 2.5 MG tablet Take 4 tablets (10 mg total) by mouth every Wednesday. Please do not resume until cleared to do so by the kidney doctor at follow-up 03/30/22   Bonnielee Haff,  MD  Multiple Vitamin (MULTIVITAMIN) tablet Take 1 tablet by mouth daily with breakfast.    [provider]  Omega-3 Fatty Acids (FISH OIL PO) Take 1 capsule by mouth in the morning and at bedtime.    [provider]  omeprazole (PRILOSEC) 40 MG capsule Take 40 mg by mouth daily before breakfast. 03/04/22   [provider]  ondansetron (ZOFRAN) 4 MG tablet Take 4 mg by mouth 3 (three) times daily as needed for nausea or vomiting. 03/12/22   [provider]  oxyCODONE (OXY IR/ROXICODONE) 5 MG immediate release tablet Take 1 tablet (5 mg total) by mouth every 4 (four) hours as needed for moderate pain. 03/28/22   Bonnielee Haff, MD  polyethylene glycol (MIRALAX / GLYCOLAX) 17 g packet Take 17 g by mouth daily. 03/29/22   Bonnielee Haff, MD  predniSONE (DELTASONE) 10 MG tablet Take 3 tablets once daily for 3 days followed by 2 tablets once daily for 3 days followed by 1 tablet once daily for 3 days and then stop 03/28/22   Bonnielee Haff, MD      Allergies    Codeine and Hydroxychloroquine    Review of Systems   Review of Systems  All other systems reviewed and are negative.   Physical Exam Updated Vital Signs BP 113/68   Pulse 89   Temp 98 F (36.7 C)  Resp 16   SpO2 100%  Physical Exam Vitals and nursing note reviewed.  Constitutional:      Appearance: She is ill-appearing.  HENT:     Head: Normocephalic and atraumatic.  Eyes:     Conjunctiva/sclera: Conjunctivae normal.  Cardiovascular:     Rate and Rhythm: Normal rate and regular rhythm.  Pulmonary:     Effort: Pulmonary effort is normal. No respiratory distress.     Breath sounds: Normal breath sounds. No stridor.  Abdominal:     General: There is no distension.     Tenderness: There is abdominal tenderness in the epigastric area. There is guarding.  Skin:    General: Skin is warm and dry.  Neurological:     Mental Status: She is alert and oriented to person, place, and time.     Cranial  Nerves: No cranial nerve deficit.  Psychiatric:        Mood and Affect: Mood normal.     ED Results / Procedures / Treatments   Labs (all labs ordered are listed, but only abnormal results are displayed) Labs Reviewed  COMPREHENSIVE METABOLIC PANEL - Abnormal; Notable for the following components:      Result Value   Potassium 3.2 (*)    Chloride 89 (*)    CO2 34 (*)    Glucose, Bld 186 (*)    BUN 25 (*)    Creatinine, Ser 1.05 (*)    AST 52 (*)    ALT 108 (*)    All other components within normal limits  CBC - Abnormal; Notable for the following components:   WBC 22.9 (*)    RDW 15.7 (*)    Platelets 1,150 (*)    All other components within normal limits  URINALYSIS, ROUTINE W REFLEX MICROSCOPIC - Abnormal; Notable for the following components:   Leukocytes,Ua SMALL (*)    Bacteria, UA RARE (*)    All other components within normal limits  LACTIC ACID, PLASMA - Abnormal; Notable for the following components:   Lactic Acid, Venous 3.9 (*)    All other components within normal limits  LACTIC ACID, PLASMA - Abnormal; Notable for the following components:   Lactic Acid, Venous 2.9 (*)    All other components within normal limits  DIFFERENTIAL - Abnormal; Notable for the following components:   Neutro Abs 19.7 (*)    Monocytes Absolute 1.8 (*)    Abs Immature Granulocytes 0.21 (*)    All other components within normal limits  LACTATE DEHYDROGENASE - Abnormal; Notable for the following components:   LDH 203 (*)    All other components within normal limits  LIPASE, BLOOD  PATHOLOGIST SMEAR REVIEW  HAPTOGLOBIN    EKG None  Radiology UE VENOUS DUPLEX (7am - 7pm)  Result Date: 04/06/2022 UPPER VENOUS STUDY  Patient Name:  Alyssa Rhodes  Date of Exam:   04/06/2022 Medical Rec #: [id number]     Accession #:    [id number] Date of Birth: August 06, 1960     Patient Gender: F Patient Age:   64 years Exam Location:  [facility] Procedure:      VAS [place] UPPER EXTREMITY VENOUS  DUPLEX Referring Phys: Herbie Baltimore Abena Erdman --------------------------------------------------------------------------------  Indications: Swelling Comparison Study: Previous exam on 03/22/22 was negative for DVT. Performing Technologist: Rogelia Rohrer RVT, RDMS  Examination Guidelines: A complete evaluation includes B-mode imaging, spectral Doppler, color Doppler, and power Doppler as needed of all accessible portions of each vessel. Bilateral testing is considered an integral  part of a complete examination. Limited examinations for reoccurring indications may be performed as noted.  Right Findings: +----------+------------+---------+-----------+----------+-------+ RIGHT     CompressiblePhasicitySpontaneousPropertiesSummary +----------+------------+---------+-----------+----------+-------+ Subclavian    Full       Yes       Yes                      +----------+------------+---------+-----------+----------+-------+  Left Findings: +----------+------------+---------+-----------+----------+-------+ LEFT      CompressiblePhasicitySpontaneousPropertiesSummary +----------+------------+---------+-----------+----------+-------+ IJV           Full       Yes       Yes                      +----------+------------+---------+-----------+----------+-------+ Subclavian    Full       Yes       Yes                      +----------+------------+---------+-----------+----------+-------+ Axillary      Full       Yes       Yes                      +----------+------------+---------+-----------+----------+-------+ Brachial      Full       Yes       Yes                      +----------+------------+---------+-----------+----------+-------+ Radial        Full                                          +----------+------------+---------+-----------+----------+-------+ Ulnar         Full                                           +----------+------------+---------+-----------+----------+-------+ Cephalic      Full                                          +----------+------------+---------+-----------+----------+-------+ Basilic       Full       Yes       No                       +----------+------------+---------+-----------+----------+-------+  Summary:  Right: No evidence of thrombosis in the subclavian.  Left: No evidence of deep vein thrombosis in the upper extremity. No evidence of superficial vein thrombosis in the upper extremity. Subcutaneous edema seen in distal upper arm and forearm.  *See table(s) above for measurements and observations.    Preliminary    DG Chest 2 View  Result Date: 04/06/2022 CLINICAL DATA:  Epigastric pain with emesis. EXAM: CHEST - 2 VIEW COMPARISON:  Chest radiograph 03/20/2010 FINDINGS: The cardiomediastinal silhouette is normal. There are small bilateral pleural effusions with minimal adjacent atelectasis. There is no other focal airspace disease. There is no pulmonary edema. There is no pleural effusion or pneumothorax There is no acute osseous abnormality. IMPRESSION: Small bilateral pleural effusions. Electronically Signed   By: Valetta Mole M.D.   On: 04/06/2022 13:02   CT Abdomen Pelvis W  Contrast  Result Date: 04/06/2022 CLINICAL DATA:  Nausea vomiting, abdominal pain.  Epigastric pain. EXAM: CT ABDOMEN AND PELVIS WITH CONTRAST TECHNIQUE: Multidetector CT imaging of the abdomen and pelvis was performed using the standard protocol following bolus administration of intravenous contrast. RADIATION DOSE REDUCTION: This exam was performed according to the departmental dose-optimization program which includes automated exposure control, adjustment of the mA and/or kV according to patient size and/or use of iterative reconstruction technique. CONTRAST:  176m OMNIPAQUE IOHEXOL 300 MG/ML  SOLN COMPARISON:  CT March 17, 2022. FINDINGS: Lower chest: Similar small to moderate bilateral  pleural effusions with adjacent relaxation atelectasis. Hepatobiliary: No suspicious hepatic lesion. Gallbladder is unremarkable. No biliary ductal dilation. Pancreas: No pancreatic ductal dilation or evidence of acute inflammation. Spleen: No splenomegaly or focal splenic lesion. Adrenals/Urinary Tract: Stable 1.4 and 1.2 cm right adrenal nodules favored to reflect benign adrenal adenomas but technically incompletely characterize. Left adrenal gland appears normal. No hydronephrosis. Kidneys demonstrate symmetric enhancement and excretion of contrast material. Urinary bladder is unremarkable for degree of distension. Stomach/Bowel: No radiopaque enteric contrast material was administered. Stomach is unremarkable for degree of distension. No pathologic dilation of small or large bowel. The appendix and terminal ileum appear normal. No evidence of acute bowel inflammation. Vascular/Lymphatic: Aortic atherosclerosis. No pathologically enlarged abdominal or pelvic lymph nodes. Reproductive: Status post hysterectomy. No adnexal masses. Other: Similar diffuse subcutaneous edema with small volume abdominopelvic ascites. Musculoskeletal: No acute osseous abnormality. IMPRESSION: 1. Similar diffuse subcutaneous edema, small volume abdominopelvic ascites and bilateral pleural effusions suggestive of third spacing. 2. No new abnormal finding in the abdomen or pelvis. 3. Stable right adrenal nodules measuring up to 1.4 cm in favored to reflect benign adrenal adenomas, suggest more definitive characterization with nonemergent adrenal protocol CT with and without contrast. 4.  Aortic Atherosclerosis (ICD10-I70.0). Electronically Signed   By: JDahlia BailiffM.D.   On: 04/06/2022 13:01    Procedures Procedures    Medications Ordered in ED Medications  lactated ringers bolus 1,000 mL (has no administration in time range)  sodium chloride 0.9 % bolus 1,000 mL (0 mLs Intravenous Stopped 04/06/22 1232)  fentaNYL (SUBLIMAZE)  injection 50 mcg (50 mcg Intravenous Given 04/06/22 1109)  ondansetron (ZOFRAN) injection 4 mg (4 mg Intravenous Given 04/06/22 1109)  iohexol (OMNIPAQUE) 300 MG/ML solution 100 mL (100 mLs Intravenous Contrast Given 04/06/22 1239)    ED Course/ Medical Decision Making/ A&P This patient with a Hx of lupus or rheumatoid arthritis, abdominal pain acute recent hospitalization presents to the ED for concern of abdominal pain, nausea, vomiting, p.o. intolerance, this involves an extensive number of treatment options, and is a complaint that carries with it a high risk of complications and morbidity.    The differential diagnosis includes gastritis, malignancy, and with discharge summary with thrombocytosis, thrombosis is a consideration, bacteremia, sepsis   Social Determinants of Health:  No limiting factors  Additional history obtained:  Additional history and/or information obtained from son at bedside and chart review, notable for details included above in HPI   After the initial evaluation, orders, including: Labs fluids IV narcotics, antiemetics were initiated.   Patient placed on Cardiac and Pulse-Oximetry Monitors. The patient was maintained on a cardiac monitor.  The cardiac monitored showed an rhythm of 90 sinus normal The patient was also maintained on pulse oximetry. The readings were typically 100% room air normal   On repeat evaluation of the patient improved  Lab Tests:  I personally interpreted labs.  The pertinent results  include: Leukocytosis, thrombocytosis, lactic acidosis all critically abnormal  Imaging Studies ordered:  I independently visualized and interpreted imaging which showed abdomen pelvis CT without alarming findings I agree with the radiologist interpretation   Dispostion / Final MDM:  After consideration of the diagnostic results and the patient's response to treatment, adult female presents with abdominal, p.o. intolerance, is found to multiple  critical out lab abnormalities including leukocytosis, thrombocytosis, some suspicion for reactive or paraneoplastic phenomenon given the substantial lab abnormalities, though occult infection remains a consideration.  However, the patient is afebrile, has no cough, no evidence for pneumonia, no CT evidence for intra-abdominal infection, urinalysis does not suggest infection either.  Patient had ultrasound of her arm due to swelling, this was similar to prior as well, low suspicion for thrombus embolic processes.  Final Clinical Impression(s) / ED Diagnoses Final diagnoses:  Epigastric pain  Bilious vomiting with nausea  Thrombocytosis  Leukocytosis, unspecified type     Carmin Muskrat, MD 04/06/22 1447

## 2022-04-06 NOTE — ED Notes (Signed)
Unable to collect blood from Pt veins keep rolling.

## 2022-04-07 DIAGNOSIS — E11649 Type 2 diabetes mellitus with hypoglycemia without coma: Secondary | ICD-10-CM | POA: Diagnosis not present

## 2022-04-07 DIAGNOSIS — R1013 Epigastric pain: Secondary | ICD-10-CM | POA: Diagnosis present

## 2022-04-07 DIAGNOSIS — D75838 Other thrombocytosis: Secondary | ICD-10-CM | POA: Diagnosis present

## 2022-04-07 DIAGNOSIS — E860 Dehydration: Secondary | ICD-10-CM | POA: Diagnosis present

## 2022-04-07 DIAGNOSIS — Z833 Family history of diabetes mellitus: Secondary | ICD-10-CM | POA: Diagnosis not present

## 2022-04-07 DIAGNOSIS — Z79631 Long term (current) use of antimetabolite agent: Secondary | ICD-10-CM | POA: Diagnosis not present

## 2022-04-07 DIAGNOSIS — K449 Diaphragmatic hernia without obstruction or gangrene: Secondary | ICD-10-CM | POA: Diagnosis present

## 2022-04-07 DIAGNOSIS — R601 Generalized edema: Secondary | ICD-10-CM | POA: Diagnosis present

## 2022-04-07 DIAGNOSIS — E1165 Type 2 diabetes mellitus with hyperglycemia: Secondary | ICD-10-CM | POA: Diagnosis present

## 2022-04-07 DIAGNOSIS — D72829 Elevated white blood cell count, unspecified: Secondary | ICD-10-CM | POA: Diagnosis present

## 2022-04-07 DIAGNOSIS — I10 Essential (primary) hypertension: Secondary | ICD-10-CM | POA: Diagnosis present

## 2022-04-07 DIAGNOSIS — E876 Hypokalemia: Secondary | ICD-10-CM | POA: Diagnosis present

## 2022-04-07 DIAGNOSIS — Z885 Allergy status to narcotic agent status: Secondary | ICD-10-CM | POA: Diagnosis not present

## 2022-04-07 DIAGNOSIS — Z8249 Family history of ischemic heart disease and other diseases of the circulatory system: Secondary | ICD-10-CM | POA: Diagnosis not present

## 2022-04-07 DIAGNOSIS — E118 Type 2 diabetes mellitus with unspecified complications: Secondary | ICD-10-CM | POA: Diagnosis not present

## 2022-04-07 DIAGNOSIS — E872 Acidosis, unspecified: Secondary | ICD-10-CM | POA: Diagnosis present

## 2022-04-07 DIAGNOSIS — M069 Rheumatoid arthritis, unspecified: Secondary | ICD-10-CM | POA: Diagnosis present

## 2022-04-07 DIAGNOSIS — Z825 Family history of asthma and other chronic lower respiratory diseases: Secondary | ICD-10-CM | POA: Diagnosis not present

## 2022-04-07 DIAGNOSIS — Z888 Allergy status to other drugs, medicaments and biological substances status: Secondary | ICD-10-CM | POA: Diagnosis not present

## 2022-04-07 DIAGNOSIS — Z79899 Other long term (current) drug therapy: Secondary | ICD-10-CM | POA: Diagnosis not present

## 2022-04-07 DIAGNOSIS — E785 Hyperlipidemia, unspecified: Secondary | ICD-10-CM | POA: Diagnosis present

## 2022-04-07 DIAGNOSIS — Z87891 Personal history of nicotine dependence: Secondary | ICD-10-CM | POA: Diagnosis not present

## 2022-04-07 DIAGNOSIS — G8929 Other chronic pain: Secondary | ICD-10-CM | POA: Diagnosis present

## 2022-04-07 DIAGNOSIS — K219 Gastro-esophageal reflux disease without esophagitis: Secondary | ICD-10-CM | POA: Diagnosis present

## 2022-04-07 DIAGNOSIS — L986 Other infiltrative disorders of the skin and subcutaneous tissue: Secondary | ICD-10-CM | POA: Diagnosis not present

## 2022-04-07 LAB — GLUCOSE, CAPILLARY
Glucose-Capillary: 52 mg/dL — ABNORMAL LOW (ref 70–99)
Glucose-Capillary: 67 mg/dL — ABNORMAL LOW (ref 70–99)
Glucose-Capillary: 34 mg/dL — CL (ref 70–99)
Glucose-Capillary: <10 mg/dL — CL (ref 70–99)
Glucose-Capillary: 81 mg/dL (ref 70–99)
Glucose-Capillary: 208 mg/dL — ABNORMAL HIGH (ref 70–99)

## 2022-04-07 LAB — CBC
HCT: 38.8 % (ref 36.0–46.0)
Hemoglobin: 12.7 g/dL (ref 12.0–15.0)
MCH: 32.8 pg (ref 26.0–34.0)
MCHC: 32.7 g/dL (ref 30.0–36.0)
MCV: 100.3 fL — ABNORMAL HIGH (ref 80.0–100.0)
Platelets: 745 10*3/uL — ABNORMAL HIGH (ref 150–400)
RBC: 3.87 MIL/uL (ref 3.87–5.11)
RDW: 16.7 % — ABNORMAL HIGH (ref 11.5–15.5)
WBC: 9.1 10*3/uL (ref 4.0–10.5)
nRBC: 0 % (ref 0.0–0.2)

## 2022-04-07 LAB — COMPREHENSIVE METABOLIC PANEL
ALT: 68 U/L — ABNORMAL HIGH (ref 0–44)
AST: 36 U/L (ref 15–41)
Albumin: 3 g/dL — ABNORMAL LOW (ref 3.5–5.0)
Alkaline Phosphatase: 50 U/L (ref 38–126)
Anion gap: 7 (ref 5–15)
BUN: 17 mg/dL (ref 8–23)
CO2: 33 mmol/L — ABNORMAL HIGH (ref 22–32)
Calcium: 9 mg/dL (ref 8.9–10.3)
Chloride: 100 mmol/L (ref 98–111)
Creatinine, Ser: 0.97 mg/dL (ref 0.44–1.00)
GFR, Estimated: >60 mL/min (ref >60)
Glucose, Bld: 112 mg/dL — ABNORMAL HIGH (ref 70–99)
Potassium: 3.5 mmol/L (ref 3.5–5.1)
Sodium: 140 mmol/L (ref 135–145)
Total Bilirubin: 0.6 mg/dL (ref 0.3–1.2)
Total Protein: 5.5 g/dL — ABNORMAL LOW (ref 6.5–8.1)

## 2022-04-07 LAB — HAPTOGLOBIN: Haptoglobin: 227 mg/dL (ref 37–355)

## 2022-04-07 LAB — PHOSPHORUS: Phosphorus: 3.6 mg/dL (ref 2.5–4.6)

## 2022-04-07 LAB — GLUCOSE, RANDOM: Glucose, Bld: 130 mg/dL — ABNORMAL HIGH (ref 70–99)

## 2022-04-07 LAB — MAGNESIUM: Magnesium: 1.8 mg/dL (ref 1.7–2.4)

## 2022-04-07 MED ORDER — DEXTROSE 50 % IV SOLN
12.5000 g | INTRAVENOUS | Status: AC
  Administered 2022-04-07: 12.5 g via INTRAVENOUS
  Filled 2022-04-07: qty 50

## 2022-04-07 MED ORDER — DEXTROSE IN LACTATED RINGERS 5 % IV SOLN: INTRAVENOUS | Status: DC

## 2022-04-07 NOTE — Consult Note (Signed)
Referring Provider: [facility] Primary Care Physician:  Doretha Sou, MD Primary Gastroenterologist:  Althia Forts  Reason for Consultation:  Abdominal pain  HPI: Alyssa Rhodes is a 61 y.o. female with medical history significant of type 2 diabetes, recently taken off metformin and currently not on any treatment, essential hypertension, hyperlipidemia, rheumatoid arthritis on methotrexate, anasarca.  Initially presented to the emergency room 12/04/2021 with severe intermittent epigastric pain and nausea.  She notes she has had episodes of epigastric pain for the last 4 months.  As the pain will last for 1 to 4 hours at a time.  She will have the episodes of pain about once per week.  With her more severe episode she will have nausea and occasional syncope.  Upon education she has had nonbloody nonbilious vomiting.  Notes this episode was more severe than normal so she presented to the ED.  Pain is usually worse on empty stomach and in the morning.  Pain usually relieved by lunchtime.  NSAID use.  Denies alcohol use and smoking, denies marijuana and IV drug use.  Notes nothing to relieve the pain and nothing worsens the pain.  Not associated with eating or bowel movements.  She said no change in bowel habits recently.  Denies weight loss, melena, hematochezia.  No known family history of gallbladder issues or liver disease.  Reports last colonoscopy 20 years ago unsure where, she recently had a Cologuard that was normal No previous EGD.   Past Medical History:  Diagnosis Date   Diabetes mellitus without complication ([place])    Hyperlipidemia    Hypertension    Lupus ([place])    Thyroid nodule 03/2010   aspiration    Past Surgical History:  Procedure Laterality Date   ABDOMINAL HYSTERECTOMY     CESAREAN SECTION     SHOULDER SURGERY  2011   LEFT    Prior to Admission medications   Medication Sig Start Date End Date Taking? Authorizing Provider  Ascorbic Acid (VITAMIN C PO) Take 1 tablet by  mouth daily.   Yes [provider]  atorvastatin (LIPITOR) 40 MG tablet Take 1 tablet (40 mg total) by mouth daily. 12/30/21  Yes Maximiano Coss, NP  dicyclomine (BENTYL) 20 MG tablet Take 10 mg by mouth 3 (three) times daily. 03/12/22  Yes [provider]  folic acid (FOLVITE) 1 MG tablet Take 1 mg by mouth in the morning. 02/27/19  Yes [provider]  gabapentin (NEURONTIN) 300 MG capsule Take 1 capsule (300 mg total) by mouth daily. 03/29/22  Yes Bonnielee Haff, MD  methotrexate 2.5 MG tablet Take 4 tablets (10 mg total) by mouth every Wednesday. Please do not resume until cleared to do so by the kidney doctor at follow-up 03/30/22  Yes Bonnielee Haff, MD  Multiple Vitamin (MULTIVITAMIN) tablet Take 1 tablet by mouth daily with breakfast.   Yes [provider]  Omega-3 Fatty Acids (FISH OIL PO) Take 1 capsule by mouth in the morning and at bedtime.   Yes [provider]  ondansetron (ZOFRAN) 4 MG tablet Take 4 mg by mouth 3 (three) times daily as needed for nausea or vomiting. 03/12/22  Yes [provider]  oxyCODONE (OXY IR/ROXICODONE) 5 MG immediate release tablet Take 1 tablet (5 mg total) by mouth every 4 (four) hours as needed for moderate pain. 03/28/22  Yes Bonnielee Haff, MD  polyethylene glycol (MIRALAX / GLYCOLAX) 17 g packet Take 17 g by mouth daily. 03/29/22  Yes Bonnielee Haff, MD  estradiol (ESTRACE  VAGINAL) 0.1 MG/GM vaginal cream Place 1 g vaginally 3 (three) times a week. Patient not taking: Reported on 04/06/2022 01/12/22   Chrzanowski, Wende Crease B, NP  furosemide (LASIX) 40 MG tablet Take 1 tablet (40 mg total) by mouth 2 (two) times daily for 7 days. 03/28/22 04/04/22  Bonnielee Haff, MD  omeprazole (PRILOSEC) 40 MG capsule Take 40 mg by mouth daily before breakfast. Patient not taking: Reported on 04/06/2022 03/04/22   [provider]  predniSONE (DELTASONE) 10 MG tablet Take 3 tablets once daily for 3 days followed by 2 tablets once  daily for 3 days followed by 1 tablet once daily for 3 days and then stop Patient not taking: Reported on 04/06/2022 03/28/22   Bonnielee Haff, MD    Scheduled Meds:  dicyclomine  10 mg Oral TID   gabapentin  300 mg Oral Daily   heparin  5,000 Units Subcutaneous Q8H   insulin aspart  0-5 Units Subcutaneous QHS   insulin aspart  0-9 Units Subcutaneous TID WC   nicotine  14 mg Transdermal Daily   Continuous Infusions:  dextrose 5% lactated ringers 100 mL/hr at 04/07/22 1032   famotidine (PEPCID) IV 20 mg (04/07/22 1033)   PRN Meds:.acetaminophen **OR** acetaminophen, fentaNYL (SUBLIMAZE) injection, ondansetron **OR** ondansetron (ZOFRAN) IV  Allergies as of 04/06/2022 - Review Complete 04/06/2022  Allergen Reaction Noted   Codeine Nausea And Vomiting 08/08/2011   Hydroxychloroquine Rash 09/07/2021    Family History  Problem Relation Age of Onset   Asthma Mother        doe not know mother well, sister advised her   Diabetes Mother    Heart disease Father        heart attack   Hypertension Father     Social History   Socioeconomic History   Marital status: Widowed    Spouse name: Not on file   Number of children: 2   Years of education: Not on file   Highest education level: Not on file  Occupational History   Occupation: accountant  Tobacco Use   Smoking status: Former    Packs/day: 1.00    Years: 41.00    Total pack years: 41.00    Types: Cigarettes   Smokeless tobacco: Never  Vaping Use   Vaping Use: Never used  Substance and Sexual Activity   Alcohol use: Not Currently    Alcohol/week: 3.0 standard drinks of alcohol    Types: 3 Standard drinks or equivalent per week    Comment: 3/week   Drug use: No   Sexual activity: Not Currently    Partners: Male  Other Topics Concern   Not on file  Social History Narrative   Not on file   Social Determinants of Health   Financial Resource Strain: Low Risk  (11/19/2018)   Overall Financial Resource Strain (CARDIA)     Difficulty of Paying Living Expenses: Not hard at all  Food Insecurity: No Food Insecurity (04/06/2022)   Hunger Vital Sign    Worried About Running Out of Food in the Last Year: Never true    Ran Out of Food in the Last Year: Never true  Transportation Needs: No Transportation Needs (04/06/2022)   PRAPARE - Hydrologist (Medical): No    Lack of Transportation (Non-Medical): No  Physical Activity: Inactive (11/19/2018)   Exercise Vital Sign    Days of Exercise per Week: 0 days    Minutes of Exercise per Session: 0 min  Stress: No  Stress Concern Present (11/19/2018)   [place]    Feeling of Stress : Only a little  Social Connections: Moderately Isolated (11/19/2018)   Social Connection and Isolation Panel [NHANES]    Frequency of Communication with Friends and Family: Never    Frequency of Social Gatherings with Friends and Family: Never    Attends Religious Services: More than 4 times per year    Active Member of [employer] or Organizations: No    Attends Archivist Meetings: Never    Marital Status: Widowed  Intimate Partner Violence: Not At Risk (04/06/2022)   Humiliation, Afraid, Rape, and Kick questionnaire    Fear of Current or Ex-Partner: No    Emotionally Abused: No    Physically Abused: No    Sexually Abused: No    Review of Systems: All negative except as stated above in HPI.  Physical Exam:Physical Exam Constitutional:      General: She is not in acute distress.    Appearance: Normal appearance. She is normal weight.  HENT:     Head: Normocephalic and atraumatic.     Right Ear: External ear normal.     Left Ear: External ear normal.     Nose: Nose normal.     Mouth/Throat:     Mouth: Mucous membranes are moist.  Eyes:     Pupils: Pupils are equal, round, and reactive to light.  Cardiovascular:     Rate and Rhythm: Normal rate and regular rhythm.     Pulses:  Normal pulses.     Heart sounds: Normal heart sounds.  Pulmonary:     Effort: Pulmonary effort is normal.     Breath sounds: Normal breath sounds.  Abdominal:     General: Abdomen is flat. Bowel sounds are normal. There is no distension.     Palpations: Abdomen is soft. There is no mass.     Tenderness: There is abdominal tenderness (mild epigastric). There is no guarding or rebound.     Hernia: No hernia is present.  Musculoskeletal:        General: No swelling. Normal range of motion.     Cervical back: Normal range of motion and neck supple.  Skin:    General: Skin is warm and dry.     Coloration: Skin is not pale.  Neurological:     General: No focal deficit present.     Mental Status: She is alert and oriented to person, place, and time. Mental status is at baseline.  Psychiatric:        Mood and Affect: Mood normal.        Behavior: Behavior normal.     Vital signs: Vitals:   04/07/22 0220 04/07/22 0553  BP:  107/69  Pulse:  77  Resp:  18  Temp:  97.8 F (36.6 C)  SpO2: 98% 98%   Last BM Date : 04/06/22    GI:  Lab Results: Recent Labs    04/06/22 1110 04/07/22 0544  WBC 22.9* 9.1  HGB 14.1 12.7  HCT 41.2 38.8  PLT 1,150* 745*   BMET Recent Labs    04/06/22 1110 04/07/22 0544  NA 136 140  K 3.2* 3.5  CL 89* 100  CO2 34* 33*  GLUCOSE 186* 112*  BUN 25* 17  CREATININE 1.05* 0.97  CALCIUM 9.5 9.0   LFT Recent Labs    04/07/22 0544  PROT 5.5*  ALBUMIN 3.0*  AST 36  ALT 68*  ALKPHOS 50  BILITOT 0.6   PT/INR No results for input(s): "LABPROT", "INR" in the last 72 hours.   Studies/Results: UE VENOUS DUPLEX (7am - 7pm)  Result Date: 04/06/2022 UPPER VENOUS STUDY  Patient Name:  Alyssa Rhodes  Date of Exam:   04/06/2022 Medical Rec #: [id number]     Accession #:    [id number] Date of Birth: January 17, 1961     Patient Gender: F Patient Age:   66 years Exam Location:  [facility] Procedure:      VAS [place] UPPER EXTREMITY VENOUS DUPLEX  Referring Phys: Herbie Baltimore LOCKWOOD --------------------------------------------------------------------------------  Indications: Swelling Comparison Study: Previous exam on 03/22/22 was negative for DVT. Performing Technologist: Rogelia Rohrer RVT, RDMS  Examination Guidelines: A complete evaluation includes B-mode imaging, spectral Doppler, color Doppler, and power Doppler as needed of all accessible portions of each vessel. Bilateral testing is considered an integral part of a complete examination. Limited examinations for reoccurring indications may be performed as noted.  Right Findings: +----------+------------+---------+-----------+----------+-------+ RIGHT     CompressiblePhasicitySpontaneousPropertiesSummary +----------+------------+---------+-----------+----------+-------+ Subclavian    Full       Yes       Yes                      +----------+------------+---------+-----------+----------+-------+  Left Findings: +----------+------------+---------+-----------+----------+-------+ LEFT      CompressiblePhasicitySpontaneousPropertiesSummary +----------+------------+---------+-----------+----------+-------+ IJV           Full       Yes       Yes                      +----------+------------+---------+-----------+----------+-------+ Subclavian    Full       Yes       Yes                      +----------+------------+---------+-----------+----------+-------+ Axillary      Full       Yes       Yes                      +----------+------------+---------+-----------+----------+-------+ Brachial      Full       Yes       Yes                      +----------+------------+---------+-----------+----------+-------+ Radial        Full                                          +----------+------------+---------+-----------+----------+-------+ Ulnar         Full                                          +----------+------------+---------+-----------+----------+-------+  Cephalic      Full                                          +----------+------------+---------+-----------+----------+-------+ Basilic       Full       Yes       No                       +----------+------------+---------+-----------+----------+-------+  Summary:  Right: No evidence of thrombosis in the subclavian.  Left: No evidence of deep vein thrombosis in the upper extremity. No evidence of superficial vein thrombosis in the upper extremity. Subcutaneous edema seen in distal upper arm and forearm.  *See table(s) above for measurements and observations.    Preliminary    DG Chest 2 View  Result Date: 04/06/2022 CLINICAL DATA:  Epigastric pain with emesis. EXAM: CHEST - 2 VIEW COMPARISON:  Chest radiograph 03/20/2010 FINDINGS: The cardiomediastinal silhouette is normal. There are small bilateral pleural effusions with minimal adjacent atelectasis. There is no other focal airspace disease. There is no pulmonary edema. There is no pleural effusion or pneumothorax There is no acute osseous abnormality. IMPRESSION: Small bilateral pleural effusions. Electronically Signed   By: Valetta Mole M.D.   On: 04/06/2022 13:02   CT Abdomen Pelvis W Contrast  Result Date: 04/06/2022 CLINICAL DATA:  Nausea vomiting, abdominal pain.  Epigastric pain. EXAM: CT ABDOMEN AND PELVIS WITH CONTRAST TECHNIQUE: Multidetector CT imaging of the abdomen and pelvis was performed using the standard protocol following bolus administration of intravenous contrast. RADIATION DOSE REDUCTION: This exam was performed according to the departmental dose-optimization program which includes automated exposure control, adjustment of the mA and/or kV according to patient size and/or use of iterative reconstruction technique. CONTRAST:  183m OMNIPAQUE IOHEXOL 300 MG/ML  SOLN COMPARISON:  CT March 17, 2022. FINDINGS: Lower chest: Similar small to moderate bilateral pleural effusions with adjacent relaxation atelectasis.  Hepatobiliary: No suspicious hepatic lesion. Gallbladder is unremarkable. No biliary ductal dilation. Pancreas: No pancreatic ductal dilation or evidence of acute inflammation. Spleen: No splenomegaly or focal splenic lesion. Adrenals/Urinary Tract: Stable 1.4 and 1.2 cm right adrenal nodules favored to reflect benign adrenal adenomas but technically incompletely characterize. Left adrenal gland appears normal. No hydronephrosis. Kidneys demonstrate symmetric enhancement and excretion of contrast material. Urinary bladder is unremarkable for degree of distension. Stomach/Bowel: No radiopaque enteric contrast material was administered. Stomach is unremarkable for degree of distension. No pathologic dilation of small or large bowel. The appendix and terminal ileum appear normal. No evidence of acute bowel inflammation. Vascular/Lymphatic: Aortic atherosclerosis. No pathologically enlarged abdominal or pelvic lymph nodes. Reproductive: Status post hysterectomy. No adnexal masses. Other: Similar diffuse subcutaneous edema with small volume abdominopelvic ascites. Musculoskeletal: No acute osseous abnormality. IMPRESSION: 1. Similar diffuse subcutaneous edema, small volume abdominopelvic ascites and bilateral pleural effusions suggestive of third spacing. 2. No new abnormal finding in the abdomen or pelvis. 3. Stable right adrenal nodules measuring up to 1.4 cm in favored to reflect benign adrenal adenomas, suggest more definitive characterization with nonemergent adrenal protocol CT with and without contrast. 4.  Aortic Atherosclerosis (ICD10-I70.0). Electronically Signed   By: JDahlia BailiffM.D.   On: 04/06/2022 13:01    Impression: Abdominal pain  CT abdomen pelvis with contrast 04/06/2022 Similar diffuse subcutaneous edema, small volume abdominopelvic ascites and bilateral pleural effusions suggestive of third spacing. 2. No new abnormal finding in the abdomen or pelvis.  Lipase 37 03/25/2022: ESR elevated 30,  repeat elevated at 1.2 03/19/2022: ANA positive, c-ANCA p-ANCA negative, glomerular basement membrane antibody normal, hepatitis panel negative  Cologuard negative - 01/11/2022  Patient with intermittent epigastric pain for 4 months.  Pain not associate with bowel movements or eating habits.  Denies recent NSAID use, denies weight loss, melena, hematochezia.  Normal CT scan.  No evidence of pancreatitis on labs or CT scan.  Possible epigastric pain due to rheumatologic etiology.  Possible epigastric pain due to  gastric ulcer, gastritis, esophagitis.  No signs of GI bleed at this time.  Hemoglobin stable at 12.7.  Plan: Plan for EGD tentatively tomorrow. I thoroughly discussed the procedures to include nature, alternatives, benefits, and risks including but not limited to bleeding, perforation, infection, anesthesia/cardiac and pulmonary complications. Patient provides understanding and gave verbal consent to proceed. Continue famotidine IV 20 mg twice daily Can have clear liquid diet from GI standoint NPO at midnight Continue anti-emetics and supportive care as needed. [facility] will follow.     LOS: 0 days   Charlott Rakes  PA-C 04/07/2022, 11:47 AM  Contact #  [phone]

## 2022-04-07 NOTE — Progress Notes (Signed)
PROGRESS NOTE    Alyssa Rhodes  OEU:[id number] DOB: [birth year] DOA: 04/06/2022 PCP: Doretha Sou, MD    Brief Narrative:  61 year old with type 2 diabetes currently not on treatment, hypertension hyperlipidemia, rheumatoid arthritis on methotrexate and recently finished prednisone taper, recently suffered from acute renal failure and now recovery presented with severe intermittent epigastric pain with negative CT scans.   Assessment & Plan:   Acute on chronic, recurrent persistent abdominal pain: Chronic uninvestigated dyspepsia.   Symptomatic treatment.  IV fluids.  NPO.  IV Pepcid twice daily. GI consulted, requesting upper GI endoscopy.  If nondiagnostic, will proceed with HIDA scan and subsequently with CT angiogram for mesenteric ischemia.  Anasarca: Urine without protein.  Unknown whether this is related to rheumatological disorder that she is suffering from.  IV fluids today.  We will start gentle diuresis after CT scans if needed.   Thrombocytosis, leukocytosis and lactic acidosis: Due to dehydration and hemoconcentration.  Improved.   Type 2 diabetes with hyperglycemia: Used to be on metformin.  Known A1c of 7.5.  Taken off all medications last admission. Keep on sliding scale insulin.  N.p.o., will keep on dextrose infusion.   Rheumatoid  arthritis: Followed by rheumatology.  Hold methotrexate  Hypokalemia: Replaced.  Adequate.  Discussed with gastroenterology for endoscopy evaluation.   DVT prophylaxis: heparin injection 5,000 Units Start: 04/06/22 2200 SCDs Start: 04/06/22 1936   Code Status: Full code Family Communication: None at the bedside Disposition Plan: Status is: Observation The patient will require care spanning > 2 midnights and should be moved to inpatient because: Persistent abdominal pain, inpatient procedures needed.     Consultants:  Gastroenterology  Procedures:  None  Antimicrobials:  None   Subjective: Patient seen and  examined.  She continues to have epigastric pain but lesser intensity since yesterday.  She was able to drain some clear liquid with her dinner, however today she is n.p.o. and blood sugars less than 60.  Denies any nausea or vomiting.  Used 1 dose of fentanyl last night because of severe pain.  Objective: Vitals:   04/07/22 0143 04/07/22 0220 04/07/22 0500 04/07/22 0553  BP: 102/70   107/69  Pulse: 71   77  Resp: 18   18  Temp: 97.8 F (36.6 C)   97.8 F (36.6 C)  TempSrc:    Oral  SpO2:  98%  98%  Weight:   63.4 kg     Intake/Output Summary (Last 24 hours) at 04/07/2022 1036 Last data filed at 04/07/2022 0412 Gross per 24 hour  Intake 1770.23 ml  Output --  Net 1770.23 ml   Filed Weights   04/07/22 0500  Weight: 63.4 kg    Examination:  General exam: Appears calm and comfortable  Respiratory system: Clear to auscultation. Respiratory effort normal. Cardiovascular system: S1 & S2 heard, RRR.  Patient has bilateral 1+ pedal edema. Gastrointestinal system: Mild tenderness epigastrium, soft and not distended.  Bowel sound present.   Central nervous system: Alert and oriented. No focal neurological deficits. Extremities: Symmetric 5 x 5 power. Skin: No rashes, lesions or ulcers Psychiatry: Judgement and insight appear normal. Mood & affect appropriate.     Data Reviewed: I have personally reviewed following labs and imaging studies  CBC: Recent Labs  Lab 04/06/22 1110 04/07/22 0544  WBC 22.9* 9.1  NEUTROABS 19.7*  --   HGB 14.1 12.7  HCT 41.2 38.8  MCV 96.5 100.3*  PLT 1,150* 154*   Basic Metabolic Panel: Recent Labs  Lab  04/06/22 1110 04/07/22 0544  NA 136 140  K 3.2* 3.5  CL 89* 100  CO2 34* 33*  GLUCOSE 186* 112*  BUN 25* 17  CREATININE 1.05* 0.97  CALCIUM 9.5 9.0  MG  --  1.8  PHOS  --  3.6   GFR: Estimated Creatinine Clearance: 53.3 mL/min (by C-G formula based on SCr of 0.97 mg/dL). Liver Function Tests: Recent Labs  Lab 04/06/22 1110  04/07/22 0544  AST 52* 36  ALT 108* 68*  ALKPHOS 61 50  BILITOT 0.6 0.6  PROT 6.8 5.5*  ALBUMIN 3.8 3.0*   Recent Labs  Lab 04/06/22 1110  LIPASE 37   No results for input(s): "AMMONIA" in the last 168 hours. Coagulation Profile: No results for input(s): "INR", "PROTIME" in the last 168 hours. Cardiac Enzymes: No results for input(s): "CKTOTAL", "CKMB", "CKMBINDEX", "TROPONINI" in the last 168 hours. BNP (last 3 results) No results for input(s): "PROBNP" in the last 8760 hours. HbA1C: No results for input(s): "HGBA1C" in the last 72 hours. CBG: Recent Labs  Lab 04/06/22 1652 04/06/22 2100 04/07/22 0808 04/07/22 0850  GLUCAP 84 157* 52* 67*   Lipid Profile: No results for input(s): "CHOL", "HDL", "LDLCALC", "TRIG", "CHOLHDL", "LDLDIRECT" in the last 72 hours. Thyroid Function Tests: No results for input(s): "TSH", "T4TOTAL", "FREET4", "T3FREE", "THYROIDAB" in the last 72 hours. Anemia Panel: No results for input(s): "VITAMINB12", "FOLATE", "FERRITIN", "TIBC", "IRON", "RETICCTPCT" in the last 72 hours. Sepsis Labs: Recent Labs  Lab 04/06/22 1042 04/06/22 1242  LATICACIDVEN 3.9* 2.9*    No results found for this or any previous visit (from the past 240 hour(s)).       Radiology Studies: UE VENOUS DUPLEX (7am - 7pm)  Result Date: 04/06/2022 UPPER VENOUS STUDY  Patient Name:  Alyssa Rhodes  Date of Exam:   04/06/2022 Medical Rec #: [id number]     Accession #:    [id number] Date of Birth: [birth year]     Patient Gender: F Patient Age:   59 years Exam Location:  [facility] Procedure:      VAS [place] UPPER EXTREMITY VENOUS DUPLEX Referring Phys: Herbie Baltimore LOCKWOOD --------------------------------------------------------------------------------  Indications: Swelling Comparison Study: Previous exam on 03/22/22 was negative for DVT. Performing Technologist: Rogelia Rohrer RVT, RDMS  Examination Guidelines: A complete evaluation includes B-mode imaging, spectral Doppler, color  Doppler, and power Doppler as needed of all accessible portions of each vessel. Bilateral testing is considered an integral part of a complete examination. Limited examinations for reoccurring indications may be performed as noted.  Right Findings: +----------+------------+---------+-----------+----------+-------+ RIGHT     CompressiblePhasicitySpontaneousPropertiesSummary +----------+------------+---------+-----------+----------+-------+ Subclavian    Full       Yes       Yes                      +----------+------------+---------+-----------+----------+-------+  Left Findings: +----------+------------+---------+-----------+----------+-------+ LEFT      CompressiblePhasicitySpontaneousPropertiesSummary +----------+------------+---------+-----------+----------+-------+ IJV           Full       Yes       Yes                      +----------+------------+---------+-----------+----------+-------+ Subclavian    Full       Yes       Yes                      +----------+------------+---------+-----------+----------+-------+ Axillary      Full  Yes       Yes                      +----------+------------+---------+-----------+----------+-------+ Brachial      Full       Yes       Yes                      +----------+------------+---------+-----------+----------+-------+ Radial        Full                                          +----------+------------+---------+-----------+----------+-------+ Ulnar         Full                                          +----------+------------+---------+-----------+----------+-------+ Cephalic      Full                                          +----------+------------+---------+-----------+----------+-------+ Basilic       Full       Yes       No                       +----------+------------+---------+-----------+----------+-------+  Summary:  Right: No evidence of thrombosis in the subclavian.  Left: No  evidence of deep vein thrombosis in the upper extremity. No evidence of superficial vein thrombosis in the upper extremity. Subcutaneous edema seen in distal upper arm and forearm.  *See table(s) above for measurements and observations.    Preliminary    DG Chest 2 View  Result Date: 04/06/2022 CLINICAL DATA:  Epigastric pain with emesis. EXAM: CHEST - 2 VIEW COMPARISON:  Chest radiograph 03/20/2010 FINDINGS: The cardiomediastinal silhouette is normal. There are small bilateral pleural effusions with minimal adjacent atelectasis. There is no other focal airspace disease. There is no pulmonary edema. There is no pleural effusion or pneumothorax There is no acute osseous abnormality. IMPRESSION: Small bilateral pleural effusions. Electronically Signed   By: Valetta Mole M.D.   On: 04/06/2022 13:02   CT Abdomen Pelvis W Contrast  Result Date: 04/06/2022 CLINICAL DATA:  Nausea vomiting, abdominal pain.  Epigastric pain. EXAM: CT ABDOMEN AND PELVIS WITH CONTRAST TECHNIQUE: Multidetector CT imaging of the abdomen and pelvis was performed using the standard protocol following bolus administration of intravenous contrast. RADIATION DOSE REDUCTION: This exam was performed according to the departmental dose-optimization program which includes automated exposure control, adjustment of the mA and/or kV according to patient size and/or use of iterative reconstruction technique. CONTRAST:  180m OMNIPAQUE IOHEXOL 300 MG/ML  SOLN COMPARISON:  CT March 17, 2022. FINDINGS: Lower chest: Similar small to moderate bilateral pleural effusions with adjacent relaxation atelectasis. Hepatobiliary: No suspicious hepatic lesion. Gallbladder is unremarkable. No biliary ductal dilation. Pancreas: No pancreatic ductal dilation or evidence of acute inflammation. Spleen: No splenomegaly or focal splenic lesion. Adrenals/Urinary Tract: Stable 1.4 and 1.2 cm right adrenal nodules favored to reflect benign adrenal adenomas but technically  incompletely characterize. Left adrenal gland appears normal. No hydronephrosis. Kidneys demonstrate symmetric enhancement and excretion of contrast material. Urinary bladder is unremarkable for degree of distension. Stomach/Bowel:  No radiopaque enteric contrast material was administered. Stomach is unremarkable for degree of distension. No pathologic dilation of small or large bowel. The appendix and terminal ileum appear normal. No evidence of acute bowel inflammation. Vascular/Lymphatic: Aortic atherosclerosis. No pathologically enlarged abdominal or pelvic lymph nodes. Reproductive: Status post hysterectomy. No adnexal masses. Other: Similar diffuse subcutaneous edema with small volume abdominopelvic ascites. Musculoskeletal: No acute osseous abnormality. IMPRESSION: 1. Similar diffuse subcutaneous edema, small volume abdominopelvic ascites and bilateral pleural effusions suggestive of third spacing. 2. No new abnormal finding in the abdomen or pelvis. 3. Stable right adrenal nodules measuring up to 1.4 cm in favored to reflect benign adrenal adenomas, suggest more definitive characterization with nonemergent adrenal protocol CT with and without contrast. 4.  Aortic Atherosclerosis (ICD10-I70.0). Electronically Signed   By: Dahlia Bailiff M.D.   On: 04/06/2022 13:01        Scheduled Meds:  dicyclomine  10 mg Oral TID   gabapentin  300 mg Oral Daily   heparin  5,000 Units Subcutaneous Q8H   insulin aspart  0-5 Units Subcutaneous QHS   insulin aspart  0-9 Units Subcutaneous TID WC   nicotine  14 mg Transdermal Daily   Continuous Infusions:  dextrose 5% lactated ringers 100 mL/hr at 04/07/22 1032   famotidine (PEPCID) IV 20 mg (04/07/22 1033)     LOS: 0 days    Time spent: 5 minutes    Barb Merino, MD [facility] Pager [phone]

## 2022-04-07 NOTE — Progress Notes (Signed)
Mobility Specialist - Progress Note   04/07/22 1041  Mobility  HOB Elevated/Bed Position Self regulated  Activity Ambulated with assistance in hallway  Range of Motion/Exercises Active  Level of Assistance Modified independent, requires aide device or extra time  Assistive Device Front wheel walker  Distance Ambulated (ft) 60 ft  Activity Response Tolerated well  Transport method Ambulatory  $Mobility charge 1 Mobility   Pt received in bed and agreeable to mobility. C/o abdominal throughout ambulation. Pt also stated feeling lightheaded towards EOS.  Pt to bed after session with all needs met.    [facility]

## 2022-04-08 ENCOUNTER — Encounter (HOSPITAL_COMMUNITY)
Admission: EM | Disposition: A | Discharge status: Home / Self Care | Payer: Self-pay | Source: Home / Self Care | Attending: Internal Medicine

## 2022-04-08 ENCOUNTER — Inpatient Hospital Stay (HOSPITAL_COMMUNITY): Payer: BC Managed Care – PPO | Admitting: Anesthesiology

## 2022-04-08 ENCOUNTER — Ambulatory Visit: Payer: BC Managed Care – PPO | Admitting: Internal Medicine

## 2022-04-08 ENCOUNTER — Inpatient Hospital Stay (HOSPITAL_COMMUNITY): Payer: BC Managed Care – PPO

## 2022-04-08 ENCOUNTER — Encounter (HOSPITAL_COMMUNITY): Payer: Self-pay | Admitting: Internal Medicine

## 2022-04-08 DIAGNOSIS — R1013 Epigastric pain: Secondary | ICD-10-CM | POA: Diagnosis not present

## 2022-04-08 DIAGNOSIS — R601 Generalized edema: Secondary | ICD-10-CM | POA: Diagnosis not present

## 2022-04-08 DIAGNOSIS — E118 Type 2 diabetes mellitus with unspecified complications: Secondary | ICD-10-CM | POA: Diagnosis not present

## 2022-04-08 HISTORY — PX: ESOPHAGOGASTRODUODENOSCOPY (EGD) WITH PROPOFOL: SHX5813

## 2022-04-08 LAB — GLUCOSE, CAPILLARY
Glucose-Capillary: 130 mg/dL — ABNORMAL HIGH (ref 70–99)
Glucose-Capillary: 26 mg/dL — CL (ref 70–99)
Glucose-Capillary: 54 mg/dL — ABNORMAL LOW (ref 70–99)
Glucose-Capillary: 165 mg/dL — ABNORMAL HIGH (ref 70–99)
Glucose-Capillary: 68 mg/dL — ABNORMAL LOW (ref 70–99)
Glucose-Capillary: >600 mg/dL (ref 70–99)
Glucose-Capillary: 117 mg/dL — ABNORMAL HIGH (ref 70–99)
Glucose-Capillary: 120 mg/dL — ABNORMAL HIGH (ref 70–99)

## 2022-04-08 LAB — GLUCOSE, RANDOM: Glucose, Bld: >600 mg/dL (ref 70–99)

## 2022-04-08 SURGERY — ESOPHAGOGASTRODUODENOSCOPY (EGD) WITH PROPOFOL
Anesthesia: Monitor Anesthesia Care

## 2022-04-08 MED ORDER — PROPOFOL 500 MG/50ML IV EMUL
INTRAVENOUS | Status: DC | PRN
  Administered 2022-04-08: 75 ug/kg/min via INTRAVENOUS

## 2022-04-08 MED ORDER — SODIUM CHLORIDE 0.9 % IV SOLN: INTRAVENOUS | Status: DC

## 2022-04-08 MED ORDER — LIDOCAINE HCL (PF) 2 % IJ SOLN
INTRAMUSCULAR | Status: DC | PRN
  Administered 2022-04-08 (×2): 100 mg via INTRADERMAL

## 2022-04-08 MED ORDER — DEXTROSE 50 % IV SOLN
INTRAVENOUS | Status: AC
  Administered 2022-04-08: 50 mL via INTRAVENOUS
  Filled 2022-04-08: qty 50

## 2022-04-08 MED ORDER — GLYCOPYRROLATE 0.2 MG/ML IJ SOLN
INTRAMUSCULAR | Status: DC | PRN
  Administered 2022-04-08: .1 mg via INTRAVENOUS

## 2022-04-08 MED ORDER — DEXTROSE 50 % IV SOLN: 1.0000 | Freq: Once | INTRAVENOUS | Status: AC

## 2022-04-08 MED ORDER — LACTATED RINGERS IV SOLN
INTRAVENOUS | Status: AC | PRN
  Administered 2022-04-08: 1000 mL via INTRAVENOUS

## 2022-04-08 MED ORDER — GADOBUTROL 1 MMOL/ML IV SOLN
0.0100 mL | Freq: Once | INTRAVENOUS | Status: AC | PRN
  Administered 2022-04-08: 0.01 mL via INTRAVENOUS

## 2022-04-08 SURGICAL SUPPLY — 15 items

## 2022-04-08 NOTE — Progress Notes (Signed)
Mobility Specialist - Progress Note   04/08/22 0936  Mobility  Activity Ambulated with assistance in hallway  Level of Assistance Contact guard assist, steadying assist  Assistive Device None  Distance Ambulated (ft) 80 ft  Activity Response Tolerated well  $Mobility charge 1 Mobility   Pt received in bed and agreed for mobility. Pain during ambulation, 9/10. Pt returned to bed with all needs met.     Mobility Specialist   

## 2022-04-08 NOTE — Op Note (Signed)
[facility] Patient Name: Alyssa Rhodes Procedure Date: 04/08/2022 MRN: [id number] Attending MD: Clarene Essex , MD Date of Birth: Nov 29, 1960 CSN: [id number] Age: 61 Admit Type: Outpatient Procedure:                Upper GI endoscopy Indications:              Epigastric abdominal pain Providers:                Clarene Essex, MD, Dulcy Fanny, Earnstine Regal, RN,                            [facility] Technician, Technician, Brien Mates, RNFA Referring MD:              Medicines:                Monitored Anesthesia Care Complications:            No immediate complications. Estimated Blood Loss:     Estimated blood loss: none. Procedure:                Pre-Anesthesia Assessment:                           - Prior to the procedure, a History and Physical                            was performed, and patient medications and                            allergies were reviewed. The patient's tolerance of                            previous anesthesia was also reviewed. The risks                            and benefits of the procedure and the sedation                            options and risks were discussed with the patient.                            All questions were answered, and informed consent                            was obtained. Prior Anticoagulants: The patient has                            taken no previous anticoagulant or antiplatelet                            agents. ASA Grade Assessment: III - A patient with                            severe  systemic disease. After reviewing the risks                            and benefits, the patient was deemed in                            satisfactory condition to undergo the procedure.                           After obtaining informed consent, the endoscope was                            passed under direct vision. Throughout the                            procedure, the patient's blood  pressure, pulse, and                            oxygen saturations were monitored continuously. The                            [id number]-[id number] ([id number]) Olympus endoscope was introduced                            through the mouth, and advanced to the third part                            of duodenum. The upper GI endoscopy was                            accomplished without difficulty. The patient                            tolerated the procedure well. Scope In: Scope Out: Findings:      A small hiatal hernia was present.      The entire examined stomach was normal.      The duodenal bulb, first portion of the duodenum, second portion of the       duodenum and third portion of the duodenum were normal.      The exam was otherwise without abnormality. Impression:               - Small hiatal hernia.                           - Normal stomach.                           - Normal duodenal bulb, first portion of the                            duodenum, second portion of the duodenum and third                            portion of the duodenum.                           -  The examination was otherwise normal.                           - No specimens collected. Moderate Sedation:      Not Applicable - Patient had care per Anesthesia. Recommendation:           - Soft diet today.                           - Continue present medications.                           - Return to GI clinic PRN.                           - Telephone GI clinic if symptomatic PRN. Would                            proceed with either a CTA or MRA of the abdomen to                            rule out intestinal ischemia first and will leave                            ordering to the primary team and if that is                            negative then                           - Perform a CCK HIDA (hepatobiliary iminodiacetic                            acid) scan at appointment to be scheduled which I                             doubt nuclear medicine will do on the weekend and I                            think she would be fine to schedule electively as                            an outpatient with her primary physician or me                            happy to follow-up the procedure. And please call                            me this weekend if I could be of any further                            assistance with this hospital stay otherwise as  above will leave to the hospital team how to                            proceed with further work-up Procedure Code(s):        --- Professional ---                           [phone], Esophagogastroduodenoscopy, flexible,                            transoral; diagnostic, including collection of                            specimen(s) by brushing or washing, when performed                            (separate procedure) Diagnosis Code(s):        --- Professional ---                           K44.9, Diaphragmatic hernia without obstruction or                            gangrene                           R10.13, Epigastric pain CPT copyright 2019 [employer]. All rights reserved. The codes documented in this report are preliminary and upon coder review may  be revised to meet current compliance requirements. Clarene Essex, MD 04/08/2022 12:34:57 PM This report has been signed electronically. Number of Addenda: 0

## 2022-04-08 NOTE — Progress Notes (Signed)
Patient being transported for procedure.

## 2022-04-08 NOTE — Progress Notes (Signed)
Hypoglycemic Event  CBG:1310 26, 1334: 68, 1352: 165  Treatment: D50 50 mL (25 gm)  Symptoms: Pale and Shaky  Follow-up CBG: Time: 1334 CBG Result: 68   Possible Reasons for Event: Unknown  Comments/MD notified: Correlate with lab draw.[place] Yanet Balliet

## 2022-04-08 NOTE — Anesthesia Preprocedure Evaluation (Addendum)
Anesthesia Evaluation  Patient identified by MRN, date of birth, ID band Patient awake    Reviewed: Allergy & Precautions, H&P , NPO status , Patient's Chart, lab work & pertinent test results  Airway Mallampati: I  TM Distance: >3 FB Neck ROM: Full    Dental no notable dental hx. (+) Edentulous Upper, Poor Dentition, Chipped, Missing, Loose, Dental Advisory Given,    Pulmonary neg pulmonary ROS, former smoker,    Pulmonary exam normal breath sounds clear to auscultation       Cardiovascular Exercise Tolerance: Good hypertension, Pt. on medications Normal cardiovascular exam Rhythm:Regular Rate:Normal     Neuro/Psych negative neurological ROS  negative psych ROS   GI/Hepatic negative GI ROS, Neg liver ROS,   Endo/Other  diabetes, Well Controlled, Type 2  Renal/GU ARFRenal disease  negative genitourinary   Musculoskeletal negative musculoskeletal ROS (+)   Abdominal   Peds negative pediatric ROS (+)  Hematology negative hematology ROS (+) Blood dyscrasia, anemia ,   Anesthesia Other Findings   Reproductive/Obstetrics negative OB ROS                            Anesthesia Physical Anesthesia Plan  ASA: 3  Anesthesia Plan: MAC   Post-op Pain Management: Minimal or no pain anticipated   Induction: Intravenous  PONV Risk Score and Plan: 2 and Propofol infusion  Airway Management Planned: Natural Airway and Simple Face Mask  Additional Equipment:   Intra-op Plan:   Post-operative Plan: Extubation in OR  Informed Consent: I have reviewed the patients History and Physical, chart, labs and discussed the procedure including the risks, benefits and alternatives for the proposed anesthesia with the patient or authorized representative who has indicated his/her understanding and acceptance.       Plan Discussed with: Anesthesiologist and CRNA  Anesthesia Plan Comments: (HPI: Alyssa Rhodes is a 61 y.o. female with medical history significant of type 2 diabetes, recently taken off metformin and currently not on any treatment, essential hypertension, hyperlipidemia, rheumatoid arthritis on methotrexate, anasarca, recent admission for renal failure comes to the emergency room with intermittent, severe 10 out of 10, epigastric pain associated with nausea, no radiation, no relation to meals.  She had this pain going on for many months now, recently worsening.  Since last 5 days she had these episodes about once or twice that were so severe she gets dizzy lightheaded and even passes out with pain.  Bowel movements have been normal.  She is on omeprazole.  Never had endoscopy evaluation. Patient was treated with iron transfusions 2 years ago.  Recently Cologuard was done and that was normal.  She never had endoluminal evaluation. CT scan on 8/24 with moderate bilateral pleural effusion consistent with anasarca.  No intra-abdominal pathology. CT scans today with similar appearing exam, does have clinical anasarca. Recently completed prednisone taper for ankle pain.  Has been taking methotrexate with rheumatologist.)        Anesthesia Quick Evaluation

## 2022-04-08 NOTE — Progress Notes (Signed)
PROGRESS NOTE    Alyssa Rhodes  LKT:[id number] DOB: [birth year] DOA: 04/06/2022 PCP: Doretha Sou, MD    Brief Narrative:  60 year old with type 2 diabetes currently not on treatment, hypertension hyperlipidemia, rheumatoid arthritis on methotrexate and recently finished prednisone taper, recently suffered from acute renal failure and now recovery presented with severe intermittent epigastric pain with negative CT scans.   Assessment & Plan:   Acute on chronic, recurrent persistent abdominal pain: Chronic uninvestigated dyspepsia.   Symptomatic treatment.  IV fluids.  Pain medications. 9/15, upper GI endoscopy with no acute findings. Order HIDA scan today with ejection fraction.  She has not used narcotics for the last 12 hours. If HIDA scan is negative, will order MR angiogram of the abdomen to look for mesenteric ischemia.  Anasarca: Urine without protein.  Unknown whether this is related to rheumatological disorder that she is suffering from.  IV fluids today.  We will start gentle diuresis after contrasted studies.   Thrombocytosis, leukocytosis and lactic acidosis: Due to dehydration and hemoconcentration.  Improved.   Type 2 diabetes with hyperglycemia: Used to be on metformin.  Known A1c of 7.5.  Taken off all medications last admission. Keep on sliding scale insulin.  N.p.o., will keep on dextrose infusion.   Rheumatoid  arthritis: Followed by rheumatology.  Hold methotrexate  Hypokalemia: Replaced.  Adequate.   DVT prophylaxis: heparin injection 5,000 Units Start: 04/06/22 2200 SCDs Start: 04/06/22 1936   Code Status: Full code Family Communication: None at the bedside Disposition Plan: Status is: Inpatient.  Remains inpatient, persistent abdominal pain.   Consultants:  Gastroenterology  Procedures:  Upper GI endoscopy 9/15, normal findings.  Antimicrobials:  None   Subjective: Patient seen and examined.  No overnight events.  Abdominal pain  persists mild to moderate all the time. By the time this documentation was created, patient underwent upper GI endoscopy and she was found to have no acute findings. We will proceed with HIDA scan and plan for MRI.  Objective: Vitals:   04/08/22 1238 04/08/22 1240 04/08/22 1245 04/08/22 1246  BP: (!) 152/82 (!) 148/82    Pulse: (!) 109 (!) 107 100 (!) 103  Resp: '11 15 11 12  '$ Temp:      TempSrc:      SpO2: 97% 94% 94% 91%  Weight:      Height:        Intake/Output Summary (Last 24 hours) at 04/08/2022 1310 Last data filed at 04/07/2022 1527 Gross per 24 hour  Intake 541.67 ml  Output --  Net 541.67 ml    Filed Weights   04/07/22 0500 04/08/22 1127  Weight: 63.4 kg 63.5 kg    Examination:  General exam: Appears calm and comfortable  Respiratory system: Clear to auscultation. Respiratory effort normal. Cardiovascular system: S1 & S2 heard, RRR.  Patient has bilateral 1+ pedal edema. Gastrointestinal system: Mild tenderness epigastrium, soft and not distended.  Bowel sound present.   Central nervous system: Alert and oriented. No focal neurological deficits. Extremities: Symmetric 5 x 5 power. Skin: No rashes, lesions or ulcers Psychiatry: Judgement and insight appear normal. Mood & affect appropriate.     Data Reviewed: I have personally reviewed following labs and imaging studies  CBC: Recent Labs  Lab 04/06/22 1110 04/07/22 0544  WBC 22.9* 9.1  NEUTROABS 19.7*  --   HGB 14.1 12.7  HCT 41.2 38.8  MCV 96.5 100.3*  PLT 1,150* 745*    Basic Metabolic Panel: Recent Labs  Lab 04/06/22 1110 04/07/22  8299 04/07/22 1253  NA 136 140  --   K 3.2* 3.5  --   CL 89* 100  --   CO2 34* 33*  --   GLUCOSE 186* 112* 130*  BUN 25* 17  --   CREATININE 1.05* 0.97  --   CALCIUM 9.5 9.0  --   MG  --  1.8  --   PHOS  --  3.6  --     GFR: Estimated Creatinine Clearance: 53.4 mL/min (by C-G formula based on SCr of 0.97 mg/dL). Liver Function Tests: Recent Labs  Lab  04/06/22 1110 04/07/22 0544  AST 52* 36  ALT 108* 68*  ALKPHOS 61 50  BILITOT 0.6 0.6  PROT 6.8 5.5*  ALBUMIN 3.8 3.0*    Recent Labs  Lab 04/06/22 1110  LIPASE 37    No results for input(s): "AMMONIA" in the last 168 hours. Coagulation Profile: No results for input(s): "INR", "PROTIME" in the last 168 hours. Cardiac Enzymes: No results for input(s): "CKTOTAL", "CKMB", "CKMBINDEX", "TROPONINI" in the last 168 hours. BNP (last 3 results) No results for input(s): "PROBNP" in the last 8760 hours. HbA1C: No results for input(s): "HGBA1C" in the last 72 hours. CBG: Recent Labs  Lab 04/07/22 1223 04/07/22 1805 04/07/22 2045 04/08/22 0807 04/08/22 1258  GLUCAP 34* 81 208* 130* 54*    Lipid Profile: No results for input(s): "CHOL", "HDL", "LDLCALC", "TRIG", "CHOLHDL", "LDLDIRECT" in the last 72 hours. Thyroid Function Tests: No results for input(s): "TSH", "T4TOTAL", "FREET4", "T3FREE", "THYROIDAB" in the last 72 hours. Anemia Panel: No results for input(s): "VITAMINB12", "FOLATE", "FERRITIN", "TIBC", "IRON", "RETICCTPCT" in the last 72 hours. Sepsis Labs: Recent Labs  Lab 04/06/22 1042 04/06/22 1242  LATICACIDVEN 3.9* 2.9*     No results found for this or any previous visit (from the past 240 hour(s)).       Radiology Studies: UE VENOUS DUPLEX (7am - 7pm)  Result Date: 04/07/2022 UPPER VENOUS STUDY  Patient Name:  Alyssa Rhodes  Date of Exam:   04/06/2022 Medical Rec #: [id number]     Accession #:    [id number] Date of Birth: 01-Aug-1960     Patient Gender: F Patient Age:   33 years Exam [facility]  [facility] Procedure:      VAS [place] UPPER EXTREMITY VENOUS DUPLEX Referring Phys: Herbie Baltimore LOCKWOOD --------------------------------------------------------------------------------  Indications: Swelling Comparison Study: Previous exam on 03/22/22 was negative for DVT. Performing Technologist: Rogelia Rohrer RVT, RDMS  Examination Guidelines: A complete evaluation includes  B-mode imaging, spectral Doppler, color Doppler, and power Doppler as needed of all accessible portions of each vessel. Bilateral testing is considered an integral part of a complete examination. Limited examinations for reoccurring indications may be performed as noted.  Right Findings: +----------+------------+---------+-----------+----------+-------+ RIGHT     CompressiblePhasicitySpontaneousPropertiesSummary +----------+------------+---------+-----------+----------+-------+ Subclavian    Full       Yes       Yes                      +----------+------------+---------+-----------+----------+-------+  Left Findings: +----------+------------+---------+-----------+----------+-------+ LEFT      CompressiblePhasicitySpontaneousPropertiesSummary +----------+------------+---------+-----------+----------+-------+ IJV           Full       Yes       Yes                      +----------+------------+---------+-----------+----------+-------+ Subclavian    Full       Yes  Yes                      +----------+------------+---------+-----------+----------+-------+ Axillary      Full       Yes       Yes                      +----------+------------+---------+-----------+----------+-------+ Brachial      Full       Yes       Yes                      +----------+------------+---------+-----------+----------+-------+ Radial        Full                                          +----------+------------+---------+-----------+----------+-------+ Ulnar         Full                                          +----------+------------+---------+-----------+----------+-------+ Cephalic      Full                                          +----------+------------+---------+-----------+----------+-------+ Basilic       Full       Yes       No                       +----------+------------+---------+-----------+----------+-------+  Summary:  Right: No evidence of  thrombosis in the subclavian.  Left: No evidence of deep vein thrombosis in the upper extremity. No evidence of superficial vein thrombosis in the upper extremity. Subcutaneous edema seen in distal upper arm and forearm.  *See table(s) above for measurements and observations.  Diagnosing physician: Jamelle Haring Electronically signed by Jamelle Haring on 04/07/2022 at 4:21:57 PM.    Final         Scheduled Meds:  dicyclomine  10 mg Oral TID   gabapentin  300 mg Oral Daily   heparin  5,000 Units Subcutaneous Q8H   insulin aspart  0-5 Units Subcutaneous QHS   insulin aspart  0-9 Units Subcutaneous TID WC   nicotine  14 mg Transdermal Daily   Continuous Infusions:  dextrose 5% lactated ringers 100 mL/hr at 04/08/22 0820   famotidine (PEPCID) IV 20 mg (04/08/22 0948)     LOS: 1 day    Time spent: 35 minutes    Barb Merino, MD [facility] Pager [phone]

## 2022-04-08 NOTE — Progress Notes (Signed)
Alyssa Rhodes 11:56 AM  Subjective: Patient doing well without any bad pains without any new complaints we rediscussed her procedure  Objective: Vital signs stable afebrile no acute distress exam please see preassessment evaluation no new labs  Assessment: Abdominal pain questionable etiology  Plan: Okay to proceed with endoscopy with anesthesia assistance with further work-up and plans pending those findings  [facility]  office [phone] After 5PM or if no answer call [phone]

## 2022-04-08 NOTE — Transfer of Care (Signed)
Immediate Anesthesia Transfer of Care Note  Patient: Alyssa Rhodes  Procedure(s) Performed: ESOPHAGOGASTRODUODENOSCOPY (EGD) WITH PROPOFOL  Patient Location: PACU  Anesthesia Type:MAC  Level of Consciousness: awake  Airway & Oxygen Therapy: Patient Spontanous Breathing  Post-op Assessment: Report given to RN  Post vital signs: stable  Last Vitals:  Vitals Value Taken Time  BP 124/78 04/08/22 1230  Temp    Pulse 42 04/08/22 1233  Resp 16 04/08/22 1233  SpO2 79 % 04/08/22 1233  Vitals shown include unvalidated device data.  Last Pain:  Vitals:   04/08/22 1127  TempSrc: Temporal  PainSc: 0-No pain      Patients Stated Pain Goal: 2 (56/15/37 9432)  Complications: No notable events documented.

## 2022-04-09 ENCOUNTER — Other Ambulatory Visit (HOSPITAL_COMMUNITY): Payer: BC Managed Care – PPO

## 2022-04-09 ENCOUNTER — Inpatient Hospital Stay (HOSPITAL_COMMUNITY): Payer: BC Managed Care – PPO

## 2022-04-09 DIAGNOSIS — E118 Type 2 diabetes mellitus with unspecified complications: Secondary | ICD-10-CM | POA: Diagnosis not present

## 2022-04-09 DIAGNOSIS — L986 Other infiltrative disorders of the skin and subcutaneous tissue: Secondary | ICD-10-CM

## 2022-04-09 DIAGNOSIS — R601 Generalized edema: Secondary | ICD-10-CM | POA: Diagnosis not present

## 2022-04-09 DIAGNOSIS — R1013 Epigastric pain: Secondary | ICD-10-CM | POA: Diagnosis not present

## 2022-04-09 LAB — GLUCOSE, CAPILLARY
Glucose-Capillary: 118 mg/dL — ABNORMAL HIGH (ref 70–99)
Glucose-Capillary: 208 mg/dL — ABNORMAL HIGH (ref 70–99)
Glucose-Capillary: 177 mg/dL — ABNORMAL HIGH (ref 70–99)
Glucose-Capillary: 183 mg/dL — ABNORMAL HIGH (ref 70–99)

## 2022-04-09 NOTE — Progress Notes (Signed)
MRI stated procedure will be performed 9/17 at 0730

## 2022-04-09 NOTE — Progress Notes (Signed)
Mobility Specialist Cancellation/Refusal Note:    04/09/22 1145  Mobility  Activity Refused mobility    Reason for Cancellation/Refusal: Pt declined mobility at this time. Pt not feeling well after eating breakfast. Will check back as schedule permits.     [facility]

## 2022-04-09 NOTE — Progress Notes (Signed)
PROGRESS NOTE    Alyssa Rhodes  WUJ:[id number] DOB: [birth year] DOA: 04/06/2022 PCP: Doretha Sou, MD    Brief Narrative:  61 year old with type 2 diabetes currently not on treatment, hypertension hyperlipidemia, rheumatoid arthritis on methotrexate and recently finished prednisone taper, recently suffered from acute renal failure and now recovered presented with severe intermittent epigastric pain with negative CT scans.  "Please note that her blood sugar levels are very erratic and false reading from capillary blood sugars"   Assessment & Plan:   Acute on chronic, recurrent persistent abdominal pain: Chronic uninvestigated dyspepsia.   Symptomatic treatment.  IV fluids.  Pain medications. 9/15, upper GI endoscopy with no acute findings. HIDA scan with ejection fraction MRI angiogram of the abdomen to look for mesenteric ischemia today.  Anasarca: Urine without protein.  Unknown whether this is related to rheumatological disorder that she is suffering from.  We will start gentle diuresis after contrasted studies.   Thrombocytosis, leukocytosis and lactic acidosis: Due to dehydration and hemoconcentration.  Improved.   Type 2 diabetes with hyperglycemia: Used to be on metformin.  Known A1c of 7.5.  Taken off all medications last admission. Wildly fluctuating and erroneous blood sugar levels.  Keep on sliding scale insulin.   Rheumatoid  arthritis: Followed by rheumatology.  Hold methotrexate  Hypokalemia: Replaced.  Adequate.   DVT prophylaxis: heparin injection 5,000 Units Start: 04/06/22 2200 SCDs Start: 04/06/22 1936   Code Status: Full code Family Communication: None at the bedside Disposition Plan: Status is: Inpatient.  Remains inpatient, persistent abdominal pain.  Needing inpatient procedures.   Consultants:  Gastroenterology  Procedures:  Upper GI endoscopy 9/15, normal findings.  Antimicrobials:  None   Subjective: Seen and examined.  Persistent  mild to moderate abdominal pain, able to keep up some liquids.  Denies any nausea.  She is just tired of hurting. We will proceed with HIDA scan and plan for MRI. IV line infiltrated overnight.  Duplex is negative.  Objective: Vitals:   04/08/22 1955 04/09/22 0500 04/09/22 0505 04/09/22 1234  BP: 115/65  (!) 122/92 128/76  Pulse: (!) 108  99 (!) 101  Resp: '20  20 18  '$ Temp: 99.4 F (37.4 C)  98.3 F (36.8 C) 97.8 F (36.6 C)  TempSrc: Oral  Oral Oral  SpO2: 96%  96% (!) 83%  Weight:  71.4 kg    Height:        Intake/Output Summary (Last 24 hours) at 04/09/2022 1332 Last data filed at 04/09/2022 0945 Gross per 24 hour  Intake 240 ml  Output --  Net 240 ml    Filed Weights   04/07/22 0500 04/08/22 1127 04/09/22 0500  Weight: 63.4 kg 63.5 kg 71.4 kg    Examination:  General exam: Looks comfortable today.  Mildly anxious. Respiratory system: Clear to auscultation. Respiratory effort normal. Cardiovascular system: S1 & S2 heard, RRR.  Patient has bilateral 1+ pedal edema.  1+ pitting edema both dorsum of the hands. Gastrointestinal system: Mild tenderness epigastrium, soft and not distended.  Bowel sound present.   Central nervous system: Alert and oriented. No focal neurological deficits. Extremities: Symmetric 5 x 5 power.    Data Reviewed: I have personally reviewed following labs and imaging studies  CBC: Recent Labs  Lab 04/06/22 1110 04/07/22 0544  WBC 22.9* 9.1  NEUTROABS 19.7*  --   HGB 14.1 12.7  HCT 41.2 38.8  MCV 96.5 100.3*  PLT 1,150* 745*    Basic Metabolic Panel: Recent Labs  Lab 04/06/22  1110 04/07/22 0544 04/07/22 1253 04/08/22 1357  NA 136 140  --   --   K 3.2* 3.5  --   --   CL 89* 100  --   --   CO2 34* 33*  --   --   GLUCOSE 186* 112* 130* >600*  BUN 25* 17  --   --   CREATININE 1.05* 0.97  --   --   CALCIUM 9.5 9.0  --   --   MG  --  1.8  --   --   PHOS  --  3.6  --   --     GFR: Estimated Creatinine Clearance: 56.3 mL/min  (by C-G formula based on SCr of 0.97 mg/dL). Liver Function Tests: Recent Labs  Lab 04/06/22 1110 04/07/22 0544  AST 52* 36  ALT 108* 68*  ALKPHOS 61 50  BILITOT 0.6 0.6  PROT 6.8 5.5*  ALBUMIN 3.8 3.0*    Recent Labs  Lab 04/06/22 1110  LIPASE 37    No results for input(s): "AMMONIA" in the last 168 hours. Coagulation Profile: No results for input(s): "INR", "PROTIME" in the last 168 hours. Cardiac Enzymes: No results for input(s): "CKTOTAL", "CKMB", "CKMBINDEX", "TROPONINI" in the last 168 hours. BNP (last 3 results) No results for input(s): "PROBNP" in the last 8760 hours. HbA1C: No results for input(s): "HGBA1C" in the last 72 hours. CBG: Recent Labs  Lab 04/08/22 1352 04/08/22 1356 04/08/22 1642 04/08/22 2207 04/09/22 0745  GLUCAP 165* >600* 117* 120* 118*    Lipid Profile: No results for input(s): "CHOL", "HDL", "LDLCALC", "TRIG", "CHOLHDL", "LDLDIRECT" in the last 72 hours. Thyroid Function Tests: No results for input(s): "TSH", "T4TOTAL", "FREET4", "T3FREE", "THYROIDAB" in the last 72 hours. Anemia Panel: No results for input(s): "VITAMINB12", "FOLATE", "FERRITIN", "TIBC", "IRON", "RETICCTPCT" in the last 72 hours. Sepsis Labs: Recent Labs  Lab 04/06/22 1042 04/06/22 1242  LATICACIDVEN 3.9* 2.9*     No results found for this or any previous visit (from the past 240 hour(s)).       Radiology Studies: VAS [place] UPPER EXTREMITY ARTERIAL DUPLEX  Result Date: 04/09/2022  UPPER EXTREMITY DUPLEX STUDY Patient Name:  Alyssa Rhodes  Date of Exam:   04/09/2022 Medical Rec #: [id number]     Accession #:    [id number] Date of Birth: [birth year]     Patient Gender: F Patient Age:   55 years Exam Location:  [facility] Procedure:      VAS [place] UPPER EXTREMITY ARTERIAL DUPLEX Referring Phys: Hollace Hayward --------------------------------------------------------------------------------  Indications: Infiltration.  Risk Factors: Hypertension. Comparison Study: No  prior studies. Performing Technologist: Oliver Hum RVT  Examination Guidelines: A complete evaluation includes B-mode imaging, spectral Doppler, color Doppler, and power Doppler as needed of all accessible portions of each vessel. Bilateral testing is considered an integral part of a complete examination. Limited examinations for reoccurring indications may be performed as noted.  Right Doppler Findings: +---------------+----------+---------+--------+--------+ Site           PSV (cm/s)Waveform StenosisComments +---------------+----------+---------+--------+--------+ Subclavian Prox79        triphasic                 +---------------+----------+---------+--------+--------+ Subclavian Dist          triphasic                 +---------------+----------+---------+--------+--------+ Axillary       172       triphasic                 +---------------+----------+---------+--------+--------+  Brachial Prox  101       triphasic                 +---------------+----------+---------+--------+--------+ Brachial Dist  106       triphasic                 +---------------+----------+---------+--------+--------+ Radial Prox    95        triphasic                 +---------------+----------+---------+--------+--------+ Radial Mid     93        triphasic                 +---------------+----------+---------+--------+--------+ Radial Dist    59        triphasic                 +---------------+----------+---------+--------+--------+ Ulnar Prox     53        triphasic                 +---------------+----------+---------+--------+--------+ Ulnar Mid      44        triphasic                 +---------------+----------+---------+--------+--------+ Ulnar Dist     46        triphasic                 +---------------+----------+---------+--------+--------+    Summary:  Right: No obstruction visualized in the right upper extremity. *See table(s) above for  measurements and observations.    Preliminary         Scheduled Meds:  dicyclomine  10 mg Oral TID   gabapentin  300 mg Oral Daily   heparin  5,000 Units Subcutaneous Q8H   insulin aspart  0-5 Units Subcutaneous QHS   insulin aspart  0-9 Units Subcutaneous TID WC   nicotine  14 mg Transdermal Daily   Continuous Infusions:     LOS: 2 days    Time spent: 35 minutes    Barb Merino, MD [facility] Pager [phone]

## 2022-04-09 NOTE — Progress Notes (Signed)
Right upper extremity arterial duplex has been completed. Preliminary results can be found in CV Proc through chart review.   04/09/22 12:56 PM Alyssa Rhodes RVT

## 2022-04-10 ENCOUNTER — Encounter (HOSPITAL_COMMUNITY): Payer: Self-pay | Admitting: Gastroenterology

## 2022-04-10 ENCOUNTER — Inpatient Hospital Stay (HOSPITAL_COMMUNITY): Payer: BC Managed Care – PPO

## 2022-04-10 DIAGNOSIS — E118 Type 2 diabetes mellitus with unspecified complications: Secondary | ICD-10-CM | POA: Diagnosis not present

## 2022-04-10 DIAGNOSIS — R601 Generalized edema: Secondary | ICD-10-CM | POA: Diagnosis not present

## 2022-04-10 DIAGNOSIS — R1013 Epigastric pain: Secondary | ICD-10-CM | POA: Diagnosis not present

## 2022-04-10 LAB — BASIC METABOLIC PANEL
Anion gap: 8 (ref 5–15)
BUN: 10 mg/dL (ref 8–23)
CO2: 26 mmol/L (ref 22–32)
Calcium: 8.6 mg/dL — ABNORMAL LOW (ref 8.9–10.3)
Chloride: 101 mmol/L (ref 98–111)
Creatinine, Ser: 0.82 mg/dL (ref 0.44–1.00)
GFR, Estimated: >60 mL/min (ref >60)
Glucose, Bld: 134 mg/dL — ABNORMAL HIGH (ref 70–99)
Potassium: 3.8 mmol/L (ref 3.5–5.1)
Sodium: 135 mmol/L (ref 135–145)

## 2022-04-10 LAB — GLUCOSE, CAPILLARY
Glucose-Capillary: 101 mg/dL — ABNORMAL HIGH (ref 70–99)
Glucose-Capillary: 196 mg/dL — ABNORMAL HIGH (ref 70–99)
Glucose-Capillary: 174 mg/dL — ABNORMAL HIGH (ref 70–99)
Glucose-Capillary: 159 mg/dL — ABNORMAL HIGH (ref 70–99)

## 2022-04-10 MED ORDER — GADOBUTROL 1 MMOL/ML IV SOLN
7.0000 mL | Freq: Once | INTRAVENOUS | Status: AC | PRN
  Administered 2022-04-10: 7 mL via INTRAVENOUS

## 2022-04-10 NOTE — Progress Notes (Signed)
Patient returned to unit. 

## 2022-04-10 NOTE — Progress Notes (Signed)
Patient left unit to go to MRI.

## 2022-04-10 NOTE — Progress Notes (Signed)
PROGRESS NOTE    Alyssa Rhodes  GLO:[id number] DOB: [birth year] DOA: 04/06/2022 PCP: Alyssa Sou, MD    Brief Narrative:  61 year old with type 2 diabetes currently not on treatment, hypertension hyperlipidemia, rheumatoid arthritis on methotrexate and recently finished prednisone taper, recently suffered from acute renal failure and now recovered presented with severe intermittent epigastric pain with negative CT scans.  "Please note that her blood sugar levels are very erratic and false reading from capillary blood sugars"   Assessment & Plan:   Acute on chronic, recurrent persistent abdominal pain:  Symptomatic treatment.  IV fluids.  Pain medications. 9/15, upper GI endoscopy with no acute findings. MRI angiogram of the abdomen to look for mesenteric ischemia today. Adequate pain medications.  If negative MRA, will do HIDA scan with ejection fraction.  Anasarca: Urine without protein.  Unknown whether this is related to rheumatological disorder that she is suffering from.  Start gentle diuresis today.   Thrombocytosis, leukocytosis and lactic acidosis: Due to dehydration and hemoconcentration.  Improved.   Type 2 diabetes with hyperglycemia: Used to be on metformin.  Known A1c of 7.5.  Taken off all medications last admission. Wildly fluctuating and erroneous blood sugar levels.  Keep on sliding scale insulin.   Rheumatoid  arthritis: Followed by rheumatology.  Hold methotrexate  Hypokalemia: Replaced.  Adequate.   DVT prophylaxis: heparin injection 5,000 Units Start: 04/06/22 2200 SCDs Start: 04/06/22 1936   Code Status: Full code Family Communication: None at the bedside Disposition Plan: Status is: Inpatient.  Remains inpatient, persistent abdominal pain.  Needing inpatient procedures.   Consultants:  Gastroenterology  Procedures:  Upper GI endoscopy 9/15, normal findings.  Antimicrobials:  None   Subjective: Examined patient in the morning  rounds.  She was trying to eat breakfast.  Mild to moderate persistent abdominal pain.  She is frustrated with not having a diagnosis.  Objective: Vitals:   04/09/22 1234 04/09/22 1935 04/10/22 0517 04/10/22 1237  BP: 128/76 125/86 104/72 115/79  Pulse: (!) 101 (!) 106 98 (!) 101  Resp: '18 16 16 20  '$ Temp: 97.8 F (36.6 C) 97.6 F (36.4 C) 98.3 F (36.8 C) 98.2 F (36.8 C)  TempSrc: Oral Oral Oral Oral  SpO2: (!) 83% 98% 98% 99%  Weight:   68.9 kg   Height:        Intake/Output Summary (Last 24 hours) at 04/10/2022 1517 Last data filed at 04/09/2022 1625 Gross per 24 hour  Intake 240 ml  Output --  Net 240 ml    Filed Weights   04/08/22 1127 04/09/22 0500 04/10/22 0517  Weight: 63.5 kg 71.4 kg 68.9 kg    Examination:  General exam: In mild distress while having pain.  On room air. Respiratory system: Clear to auscultation. Respiratory effort normal. Cardiovascular system: S1 & S2 heard, RRR.  Patient has bilateral 1+ pedal edema.  1+ pitting edema both dorsum of the hands. Gastrointestinal system: Mild tenderness epigastrium, soft and not distended.  Bowel sound present.   Central nervous system: Alert and oriented. No focal neurological deficits. Extremities: Symmetric 5 x 5 power.    Data Reviewed: I have personally reviewed following labs and imaging studies  CBC: Recent Labs  Lab 04/06/22 1110 04/07/22 0544  WBC 22.9* 9.1  NEUTROABS 19.7*  --   HGB 14.1 12.7  HCT 41.2 38.8  MCV 96.5 100.3*  PLT 1,150* 745*    Basic Metabolic Panel: Recent Labs  Lab 04/06/22 1110 04/07/22 0544 04/07/22 1253 04/08/22 1357 04/10/22  0725  NA 136 140  --   --  135  K 3.2* 3.5  --   --  3.8  CL 89* 100  --   --  101  CO2 34* 33*  --   --  26  GLUCOSE 186* 112* 130* >600* 134*  BUN 25* 17  --   --  10  CREATININE 1.05* 0.97  --   --  0.82  CALCIUM 9.5 9.0  --   --  8.6*  MG  --  1.8  --   --   --   PHOS  --  3.6  --   --   --     GFR: Estimated Creatinine  Clearance: 65.5 mL/min (by C-G formula based on SCr of 0.82 mg/dL). Liver Function Tests: Recent Labs  Lab 04/06/22 1110 04/07/22 0544  AST 52* 36  ALT 108* 68*  ALKPHOS 61 50  BILITOT 0.6 0.6  PROT 6.8 5.5*  ALBUMIN 3.8 3.0*    Recent Labs  Lab 04/06/22 1110  LIPASE 37    No results for input(s): "AMMONIA" in the last 168 hours. Coagulation Profile: No results for input(s): "INR", "PROTIME" in the last 168 hours. Cardiac Enzymes: No results for input(s): "CKTOTAL", "CKMB", "CKMBINDEX", "TROPONINI" in the last 168 hours. BNP (last 3 results) No results for input(s): "PROBNP" in the last 8760 hours. HbA1C: No results for input(s): "HGBA1C" in the last 72 hours. CBG: Recent Labs  Lab 04/09/22 1145 04/09/22 1637 04/09/22 2123 04/10/22 0726 04/10/22 1133  GLUCAP 208* 177* 183* 101* 196*    Lipid Profile: No results for input(s): "CHOL", "HDL", "LDLCALC", "TRIG", "CHOLHDL", "LDLDIRECT" in the last 72 hours. Thyroid Function Tests: No results for input(s): "TSH", "T4TOTAL", "FREET4", "T3FREE", "THYROIDAB" in the last 72 hours. Anemia Panel: No results for input(s): "VITAMINB12", "FOLATE", "FERRITIN", "TIBC", "IRON", "RETICCTPCT" in the last 72 hours. Sepsis Labs: Recent Labs  Lab 04/06/22 1042 04/06/22 1242  LATICACIDVEN 3.9* 2.9*     No results found for this or any previous visit (from the past 240 hour(s)).       Radiology Studies: VAS [place] UPPER EXTREMITY ARTERIAL DUPLEX  Result Date: 04/10/2022  UPPER EXTREMITY DUPLEX STUDY Patient Name:  Alyssa Rhodes  Date of Exam:   04/09/2022 Medical Rec #: [id number]     Accession #:    [id number] Date of Birth: 1960-08-16     Patient Gender: F Patient Age:   62 years Exam Location:  [facility] Procedure:      VAS [place] UPPER EXTREMITY ARTERIAL DUPLEX Referring Phys: Hollace Hayward --------------------------------------------------------------------------------  Indications: Infiltration.  Risk Factors:  Hypertension. Comparison Study: No prior studies. Performing Technologist: Oliver Hum RVT  Examination Guidelines: A complete evaluation includes B-mode imaging, spectral Doppler, color Doppler, and power Doppler as needed of all accessible portions of each vessel. Bilateral testing is considered an integral part of a complete examination. Limited examinations for reoccurring indications may be performed as noted.  Right Doppler Findings: +---------------+----------+---------+--------+--------+ Site           PSV (cm/s)Waveform StenosisComments +---------------+----------+---------+--------+--------+ Subclavian Prox79        triphasic                 +---------------+----------+---------+--------+--------+ Subclavian Dist          triphasic                 +---------------+----------+---------+--------+--------+ Axillary       172       triphasic                 +---------------+----------+---------+--------+--------+  Brachial Prox  101       triphasic                 +---------------+----------+---------+--------+--------+ Brachial Dist  106       triphasic                 +---------------+----------+---------+--------+--------+ Radial Prox    95        triphasic                 +---------------+----------+---------+--------+--------+ Radial Mid     93        triphasic                 +---------------+----------+---------+--------+--------+ Radial Dist    59        triphasic                 +---------------+----------+---------+--------+--------+ Ulnar Prox     53        triphasic                 +---------------+----------+---------+--------+--------+ Ulnar Mid      44        triphasic                 +---------------+----------+---------+--------+--------+ Ulnar Dist     46        triphasic                 +---------------+----------+---------+--------+--------+    Summary:  Right: No obstruction visualized in the right upper extremity.  *See table(s) above for measurements and observations. Electronically signed by Servando Snare MD on 04/10/2022 at 10:34:36 AM.    Final         Scheduled Meds:  dicyclomine  10 mg Oral TID   gabapentin  300 mg Oral Daily   heparin  5,000 Units Subcutaneous Q8H   insulin aspart  0-5 Units Subcutaneous QHS   insulin aspart  0-9 Units Subcutaneous TID WC   nicotine  14 mg Transdermal Daily   Continuous Infusions:     LOS: 3 days    Time spent: 35 minutes    Barb Merino, MD [facility] Pager [phone]

## 2022-04-11 ENCOUNTER — Inpatient Hospital Stay (HOSPITAL_COMMUNITY): Payer: BC Managed Care – PPO

## 2022-04-11 DIAGNOSIS — R1013 Epigastric pain: Secondary | ICD-10-CM | POA: Diagnosis not present

## 2022-04-11 DIAGNOSIS — E118 Type 2 diabetes mellitus with unspecified complications: Secondary | ICD-10-CM | POA: Diagnosis not present

## 2022-04-11 DIAGNOSIS — R601 Generalized edema: Secondary | ICD-10-CM | POA: Diagnosis not present

## 2022-04-11 LAB — GLUCOSE, CAPILLARY
Glucose-Capillary: 113 mg/dL — ABNORMAL HIGH (ref 70–99)
Glucose-Capillary: 151 mg/dL — ABNORMAL HIGH (ref 70–99)

## 2022-04-11 LAB — PATHOLOGIST SMEAR REVIEW

## 2022-04-11 MED ORDER — FUROSEMIDE 20 MG PO TABS
20.0000 mg | ORAL_TABLET | Freq: Every day | ORAL | Status: DC
  Administered 2022-04-11: 20 mg via ORAL
  Filled 2022-04-11: qty 1

## 2022-04-11 MED ORDER — OXYCODONE HCL 5 MG PO TABS: 5.0000 mg | ORAL_TABLET | ORAL | 0 refills | Status: AC | PRN

## 2022-04-11 MED ORDER — TECHNETIUM TC 99M MEBROFENIN IV KIT
5.3000 | PACK | Freq: Once | INTRAVENOUS | Status: AC
  Administered 2022-04-11: 5.3 via INTRAVENOUS

## 2022-04-11 MED ORDER — METFORMIN HCL 500 MG PO TABS: 500.0000 mg | ORAL_TABLET | Freq: Every day | ORAL | 11 refills | Status: DC

## 2022-04-11 MED ORDER — FUROSEMIDE 40 MG PO TABS: 40.0000 mg | ORAL_TABLET | Freq: Two times a day (BID) | ORAL | 0 refills | Status: DC

## 2022-04-11 MED ORDER — POTASSIUM CHLORIDE 20 MEQ PO PACK: 20.0000 meq | PACK | Freq: Every day | ORAL | 0 refills | Status: DC

## 2022-04-11 NOTE — Progress Notes (Signed)
  Transition of Care [facility]) Screening Note   Patient Details  Name: Alyssa Rhodes Date of Birth: [birth year]   Transition of Care [facility]) CM/SW Contact:    Vassie Moselle, LCSW Phone Number: 04/11/2022, 11:36 AM    Transition of Care Department [facility]) has reviewed patient and no TOC needs have been identified at this time. We will continue to monitor patient advancement through interdisciplinary progression rounds. If new patient transition needs arise, please place a TOC consult.

## 2022-04-11 NOTE — Discharge Summary (Signed)
Physician Discharge Summary  Alyssa Rhodes JTT:[id number] DOB: 06/27/1961 DOA: 04/06/2022  PCP: Doretha Sou, MD  Admit date: 04/06/2022 Discharge date: 04/11/2022  Admitted From: Home Disposition: Home  Recommendations for Outpatient Follow-up:  Follow up with PCP in 1-2 weeks Please obtain BMP/CBC in one week Schedule follow-up with your rheumatologist  Home Health: N/A Equipment/Devices: N/A N/A  Discharge Condition: Stable CODE STATUS: Full code Diet recommendation: Low-salt and low-carb diet  Discharge summary: 61 year old with history of type 2 diabetes currently off medications, essential hypertension, hyperlipidemia, rheumatoid arthritis on methotrexate and recently completed prednisone taper, she recently suffered from acute renal failure and now recovered presented with severe intermittent epigastric and generalized abdominal pain.  She did have significant pain, had history of iron deficiency anemia.  Was admitted and underwent extensive testings as below.  Upper GI endoscopy 9/15, no acute findings. MRA of the abdomen pelvis and mesenteric angiogram, no stenosis or large vessel occlusion.  Anasarca. HIDA scan with ejection fraction 9/18, slightly reduced gallbladder function, pain not reproduced with CCK injection.  Her pain is not postprandial.  Remains with mild diffuse pain with no relation to meal or bowel movement. This is likely related to anasarca. Clinically improved. Renal functions normal.  She did not have any protein on her urine.  Thrombocytosis and leukocytosis has improved. Primary cause unknown, could be visceral edema giving her pain. Suggest symptomatic treatment, will prescribe short course of opiates for moderate to severe pain. Start patient on Lasix 40 mg twice daily, added potassium supplementation.  Outpatient follow-up.  Type 2 diabetes with hyperglycemia: She used to be on metformin.  A1c 7.5.  She was taken off all medications due to  severe renal failure.  Now her kidney functions have normalized.  We will go back on metformin.  Rheumatoid arthritis: Followed by rheumatology.  Since her renal functions normalized, she can go back on methotrexate. She has upcoming follow-up with rheumatology, advised to discuss with them whether inflammation and swelling is related to rheumatological disorder.  Stable.  Tolerating diet.  Pain is managed with oral pain medication.  Patient is able to go home.    Discharge Diagnoses:  Principal Problem:   Abdominal pain Active Problems:   HTN (hypertension)   Microcytic anemia   Reactive thrombocytosis   Leucocytosis   Anasarca   Type 2 diabetes mellitus with complication, without long-term current use of insulin [facility])    Discharge Instructions  Discharge Instructions     Call MD for:  persistant nausea and vomiting   Complete by: As directed    Call MD for:  severe uncontrolled pain   Complete by: As directed    Diet - low sodium heart healthy   Complete by: As directed    Diet Carb Modified   Complete by: As directed    Increase activity slowly   Complete by: As directed       Allergies as of 04/11/2022       Reactions   Codeine Nausea And Vomiting   Hydroxychloroquine Rash        Medication List     STOP taking these medications    dicyclomine 20 MG tablet Commonly known as: BENTYL   estradiol 0.1 MG/GM vaginal cream Commonly known as: ESTRACE VAGINAL   predniSONE 10 MG tablet Commonly known as: DELTASONE       TAKE these medications    atorvastatin 40 MG tablet Commonly known as: LIPITOR Take 1 tablet (40 mg total) by mouth daily.  FISH OIL PO Take 1 capsule by mouth in the morning and at bedtime.   folic acid 1 MG tablet Commonly known as: FOLVITE Take 1 mg by mouth in the morning.   furosemide 40 MG tablet Commonly known as: LASIX Take 1 tablet (40 mg total) by mouth 2 (two) times daily.   gabapentin 300 MG capsule Commonly  known as: NEURONTIN Take 1 capsule (300 mg total) by mouth daily.   metFORMIN 500 MG tablet Commonly known as: Glucophage Take 1 tablet (500 mg total) by mouth daily with breakfast.   methotrexate 2.5 MG tablet Take 4 tablets (10 mg total) by mouth every Wednesday. Please do not resume until cleared to do so by the kidney doctor at follow-up   multivitamin tablet Take 1 tablet by mouth daily with breakfast.   omeprazole 40 MG capsule Commonly known as: PRILOSEC Take 40 mg by mouth daily before breakfast.   ondansetron 4 MG tablet Commonly known as: ZOFRAN Take 4 mg by mouth 3 (three) times daily as needed for nausea or vomiting.   oxyCODONE 5 MG immediate release tablet Commonly known as: Oxy IR/ROXICODONE Take 1 tablet (5 mg total) by mouth every 4 (four) hours as needed for moderate pain.   polyethylene glycol 17 g packet Commonly known as: MIRALAX / GLYCOLAX Take 17 g by mouth daily.   potassium chloride 20 MEQ packet Commonly known as: KLOR-CON Take 20 mEq by mouth daily.   VITAMIN C PO Take 1 tablet by mouth daily.        Allergies  Allergen Reactions   Codeine Nausea And Vomiting   Hydroxychloroquine Rash    Consultations: Gastroenterology   Procedures/Studies: NM Hepato W/EF  Result Date: 04/11/2022 CLINICAL DATA:  Chronic upper abdominal pain. Assess gallbladder motility. Pain, nausea, and vomiting for about 4 months. EXAM: NUCLEAR MEDICINE HEPATOBILIARY IMAGING WITH GALLBLADDER EF TECHNIQUE: Sequential images of the abdomen were obtained out to 60 minutes following intravenous administration of radiopharmaceutical. After oral ingestion of Ensure, gallbladder ejection fraction was determined. At 60 min, normal ejection fraction is greater than 33%. RADIOPHARMACEUTICALS:  5.3 mCi Tc-44mmebrofenin IV COMPARISON:  CT abdomen and pelvis 04/06/2022 FINDINGS: Prompt uptake and biliary excretion of activity by the liver is seen. Gallbladder activity is  visualized starting at 5-10 minutes, consistent with patency of cystic duct. Biliary activity passes into small bowel, consistent with patent common bile duct. Calculated gallbladder ejection fraction is 18%. (Normal gallbladder ejection fraction with Ensure is greater than 33%.) IMPRESSION: 1. Patent cystic and common bile ducts. 2. Decreased gallbladder ejection fraction. This can be seen with chronic gallbladder dysmotility. Electronically Signed   By: RYvonne KendallM.D.   On: 04/11/2022 12:12   MR ANGIO ABDOMEN W WO CONTRAST  Result Date: 04/11/2022 CLINICAL DATA:  61year old female with questionable mesenteric ischemia EXAM: MRA ABDOMEN AND PELVIS WITH CONTRAST TECHNIQUE: Multiplanar, multiecho pulse sequences of the abdomen and pelvis were obtained with intravenous contrast. Angiographic images of abdomen and pelvis were obtained using MRA technique with intravenous contrast. CONTRAST:  752mGADAVIST GADOBUTROL 1 MMOL/ML IV SOLN COMPARISON:  CT 04/06/2022 FINDINGS: VASCULAR Aorta: Unremarkable course, caliber, contour of the abdominal aorta. Flow signal is maintained. No wall thickening. Signal loss secondary to calcified plaque within the abdominal aorta, similar distribution to the recent CT imaging. Celiac: Flow signal maintained in the celiac artery without significant artifact from calcified plaque. SMA: Flow signal maintained within the SMA, without significant calcified plaque. Renals: The fidelity of the study for  the assessment of the renal arteries is limited by anasarca/edema, respiratory motion. The maximum intensity projection images demonstrate that the renal arteries maintain flow signal at the origins, however, assessment for any renal artery abnormalities is limited in specificity given the artifact. IMA: Flow signal is maintained within the IMA Lower extremities: The fidelity of the study for the assessment of the iliac arteries is limited by anasarca/edema, respiratory motion. The  maximum intensity projection images demonstrate that the bilateral common iliac arteries and external iliac arteries maintain flow signal at the origins, however, assessment for any abnormality is limited in specificity given the artifact. Evidence calcified atherosclerotic plaque, similar to recent CT. Veins: Unremarkable appearance of the venous system. Review of the MIP images confirms the above findings. NON-VASCULAR Lower chest: Bilateral pleural effusions with associated atelectasis. Hepatobiliary: Uniform signal throughout liver. Unremarkable gall bladder. Pancreas: Uniform signal. Spleen: Uniform signal. Adrenals/Urinary Tract: - Right adrenal gland: Redemonstration of right adrenal nodule, not well characterized on the current MR. Prior measurement of 14 mm. - Left adrenal gland: Unremarkable. - Right kidney: No hydronephrosis.  Symmetric size to the left. - Left Kidney: No hydronephrosis.  Symmetric to the right. - Urinary Bladder: Partially distended Stomach/Bowel: Stomach partially distended. No distention of small bowel, otherwise unremarkable signal. Moderate to large formed stool burden. Lymphatic: No adenopathy. Mesenteric: Mesenteric edema better demonstrated on recent CT. Trace ascites within the dependent abdomen/pelvis. Reproductive: Hysterectomy Other: Body wall edema/anasarca. Musculoskeletal: Uniform signal of the visualized skeletal elements. IMPRESSION: The MR angiogram fidelity is limited by diffuse anasarca/edema and respiratory motion. Generally, study is negative for evidence of mesenteric ischemia, as the maximum intensity projection (MIP) images demonstrate that flow signal is maintained within the mesenteric arteries, however, assessment for subtle abnormalities is limited in specificity given the artifact. The same is true for renal arteries and the bilateral iliac arteries. Fluid positive state, with bilateral pleural effusions, low volume ascites, diffuse body wall/mesenteric  anasarca/edema. Aortic Atherosclerosis (ICD10-I70.0). Additional ancillary findings as above Signed, Dulcy Fanny. Nadene Rubins, RPVI [facility] Radiology Electronically Signed   By: Corrie Mckusick D.O.   On: 04/11/2022 09:15   MR ANGIO PELVIS W WO CONTRAST  Result Date: 04/11/2022 CLINICAL DATA:  61 year old female with questionable mesenteric ischemia EXAM: MRA ABDOMEN AND PELVIS WITH CONTRAST TECHNIQUE: Multiplanar, multiecho pulse sequences of the abdomen and pelvis were obtained with intravenous contrast. Angiographic images of abdomen and pelvis were obtained using MRA technique with intravenous contrast. CONTRAST:  37m GADAVIST GADOBUTROL 1 MMOL/ML IV SOLN COMPARISON:  CT 04/06/2022 FINDINGS: VASCULAR Aorta: Unremarkable course, caliber, contour of the abdominal aorta. Flow signal is maintained. No wall thickening. Signal loss secondary to calcified plaque within the abdominal aorta, similar distribution to the recent CT imaging. Celiac: Flow signal maintained in the celiac artery without significant artifact from calcified plaque. SMA: Flow signal maintained within the SMA, without significant calcified plaque. Renals: The fidelity of the study for the assessment of the renal arteries is limited by anasarca/edema, respiratory motion. The maximum intensity projection images demonstrate that the renal arteries maintain flow signal at the origins, however, assessment for any renal artery abnormalities is limited in specificity given the artifact. IMA: Flow signal is maintained within the IMA Lower extremities: The fidelity of the study for the assessment of the iliac arteries is limited by anasarca/edema, respiratory motion. The maximum intensity projection images demonstrate that the bilateral common iliac arteries and external iliac arteries maintain flow signal at the origins, however, assessment  for any abnormality is limited in specificity given the  artifact. Evidence calcified atherosclerotic plaque, similar to recent CT. Veins: Unremarkable appearance of the venous system. Review of the MIP images confirms the above findings. NON-VASCULAR Lower chest: Bilateral pleural effusions with associated atelectasis. Hepatobiliary: Uniform signal throughout liver. Unremarkable gall bladder. Pancreas: Uniform signal. Spleen: Uniform signal. Adrenals/Urinary Tract: - Right adrenal gland: Redemonstration of right adrenal nodule, not well characterized on the current MR. Prior measurement of 14 mm. - Left adrenal gland: Unremarkable. - Right kidney: No hydronephrosis.  Symmetric size to the left. - Left Kidney: No hydronephrosis.  Symmetric to the right. - Urinary Bladder: Partially distended Stomach/Bowel: Stomach partially distended. No distention of small bowel, otherwise unremarkable signal. Moderate to large formed stool burden. Lymphatic: No adenopathy. Mesenteric: Mesenteric edema better demonstrated on recent CT. Trace ascites within the dependent abdomen/pelvis. Reproductive: Hysterectomy Other: Body wall edema/anasarca. Musculoskeletal: Uniform signal of the visualized skeletal elements. IMPRESSION: The MR angiogram fidelity is limited by diffuse anasarca/edema and respiratory motion. Generally, study is negative for evidence of mesenteric ischemia, as the maximum intensity projection (MIP) images demonstrate that flow signal is maintained within the mesenteric arteries, however, assessment for subtle abnormalities is limited in specificity given the artifact. The same is true for renal arteries and the bilateral iliac arteries. Fluid positive state, with bilateral pleural effusions, low volume ascites, diffuse body wall/mesenteric anasarca/edema. Aortic Atherosclerosis (ICD10-I70.0). Additional ancillary findings as above Signed, Dulcy Fanny. Nadene Rubins, RPVI [facility] Radiology Electronically Signed   By:  Corrie Mckusick D.O.   On: 04/11/2022 09:15   VAS [place] UPPER EXTREMITY ARTERIAL DUPLEX  Result Date: 04/10/2022  UPPER EXTREMITY DUPLEX STUDY Patient Name:  COLETA GROSSHANS  Date of Exam:   04/09/2022 Medical Rec #: [id number]     Accession #:    [id number] Date of Birth: 05-May-1961     Patient Gender: F Patient Age:   62 years Exam Location:  [facility] Procedure:      VAS [place] UPPER EXTREMITY ARTERIAL DUPLEX Referring Phys: Hollace Hayward --------------------------------------------------------------------------------  Indications: Infiltration.  Risk Factors: Hypertension. Comparison Study: No prior studies. Performing Technologist: Oliver Hum RVT  Examination Guidelines: A complete evaluation includes B-mode imaging, spectral Doppler, color Doppler, and power Doppler as needed of all accessible portions of each vessel. Bilateral testing is considered an integral part of a complete examination. Limited examinations for reoccurring indications may be performed as noted.  Right Doppler Findings: +---------------+----------+---------+--------+--------+ Site           PSV (cm/s)Waveform StenosisComments +---------------+----------+---------+--------+--------+ Subclavian Prox79        triphasic                 +---------------+----------+---------+--------+--------+ Subclavian Dist          triphasic                 +---------------+----------+---------+--------+--------+ Axillary       172       triphasic                 +---------------+----------+---------+--------+--------+ Brachial Prox  101       triphasic                 +---------------+----------+---------+--------+--------+ Brachial Dist  106       triphasic                 +---------------+----------+---------+--------+--------+ Radial Prox    95        triphasic                 +---------------+----------+---------+--------+--------+  Radial Mid     93        triphasic                  +---------------+----------+---------+--------+--------+ Radial Dist    59        triphasic                 +---------------+----------+---------+--------+--------+ Ulnar Prox     53        triphasic                 +---------------+----------+---------+--------+--------+ Ulnar Mid      44        triphasic                 +---------------+----------+---------+--------+--------+ Ulnar Dist     46        triphasic                 +---------------+----------+---------+--------+--------+    Summary:  Right: No obstruction visualized in the right upper extremity. *See table(s) above for measurements and observations. Electronically signed by Servando Snare MD on 04/10/2022 at 10:34:36 AM.    Final    UE VENOUS DUPLEX (7am - 7pm)  Result Date: 04/07/2022 UPPER VENOUS STUDY  Patient Name:  SHIRELLE TOOTLE  Date of Exam:   04/06/2022 Medical Rec #: [id number]     Accession #:    [id number] Date of Birth: 04/19/1961     Patient Gender: F Patient Age:   36 years Exam Location:  [address] [facility] Procedure:      VAS [place] UPPER EXTREMITY VENOUS DUPLEX Referring Phys: Herbie Baltimore LOCKWOOD --------------------------------------------------------------------------------  Indications: Swelling Comparison Study: Previous exam on 03/22/22 was negative for DVT. Performing Technologist: Rogelia Rohrer RVT, RDMS  Examination Guidelines: A complete evaluation includes B-mode imaging, spectral Doppler, color Doppler, and power Doppler as needed of all accessible portions of each vessel. Bilateral testing is considered an integral part of a complete examination. Limited examinations for reoccurring indications may be performed as noted.  Right Findings: +----------+------------+---------+-----------+----------+-------+ RIGHT     CompressiblePhasicitySpontaneousPropertiesSummary +----------+------------+---------+-----------+----------+-------+ Subclavian    Full       Yes       Yes                       +----------+------------+---------+-----------+----------+-------+  Left Findings: +----------+------------+---------+-----------+----------+-------+ LEFT      CompressiblePhasicitySpontaneousPropertiesSummary +----------+------------+---------+-----------+----------+-------+ IJV           Full       Yes       Yes                      +----------+------------+---------+-----------+----------+-------+ Subclavian    Full       Yes       Yes                      +----------+------------+---------+-----------+----------+-------+ Axillary      Full       Yes       Yes                      +----------+------------+---------+-----------+----------+-------+ Brachial      Full       Yes       Yes                      +----------+------------+---------+-----------+----------+-------+ Radial        Full                                          +----------+------------+---------+-----------+----------+-------+  Ulnar         Full                                          +----------+------------+---------+-----------+----------+-------+ Cephalic      Full                                          +----------+------------+---------+-----------+----------+-------+ Basilic       Full       Yes       No                       +----------+------------+---------+-----------+----------+-------+  Summary:  Right: No evidence of thrombosis in the subclavian.  Left: No evidence of deep vein thrombosis in the upper extremity. No evidence of superficial vein thrombosis in the upper extremity. Subcutaneous edema seen in distal upper arm and forearm.  *See table(s) above for measurements and observations.  Diagnosing physician: Jamelle Haring Electronically signed by Jamelle Haring on 04/07/2022 at 4:21:57 PM.    Final    DG Chest 2 View  Result Date: 04/06/2022 CLINICAL DATA:  Epigastric pain with emesis. EXAM: CHEST - 2 VIEW COMPARISON:  Chest radiograph 03/20/2010 FINDINGS: The  cardiomediastinal silhouette is normal. There are small bilateral pleural effusions with minimal adjacent atelectasis. There is no other focal airspace disease. There is no pulmonary edema. There is no pleural effusion or pneumothorax There is no acute osseous abnormality. IMPRESSION: Small bilateral pleural effusions. Electronically Signed   By: Valetta Mole M.D.   On: 04/06/2022 13:02   CT Abdomen Pelvis W Contrast  Result Date: 04/06/2022 CLINICAL DATA:  Nausea vomiting, abdominal pain.  Epigastric pain. EXAM: CT ABDOMEN AND PELVIS WITH CONTRAST TECHNIQUE: Multidetector CT imaging of the abdomen and pelvis was performed using the standard protocol following bolus administration of intravenous contrast. RADIATION DOSE REDUCTION: This exam was performed according to the departmental dose-optimization program which includes automated exposure control, adjustment of the mA and/or kV according to patient size and/or use of iterative reconstruction technique. CONTRAST:  144m OMNIPAQUE IOHEXOL 300 MG/ML  SOLN COMPARISON:  CT March 17, 2022. FINDINGS: Lower chest: Similar small to moderate bilateral pleural effusions with adjacent relaxation atelectasis. Hepatobiliary: No suspicious hepatic lesion. Gallbladder is unremarkable. No biliary ductal dilation. Pancreas: No pancreatic ductal dilation or evidence of acute inflammation. Spleen: No splenomegaly or focal splenic lesion. Adrenals/Urinary Tract: Stable 1.4 and 1.2 cm right adrenal nodules favored to reflect benign adrenal adenomas but technically incompletely characterize. Left adrenal gland appears normal. No hydronephrosis. Kidneys demonstrate symmetric enhancement and excretion of contrast material. Urinary bladder is unremarkable for degree of distension. Stomach/Bowel: No radiopaque enteric contrast material was administered. Stomach is unremarkable for degree of distension. No pathologic dilation of small or large bowel. The appendix and terminal ileum  appear normal. No evidence of acute bowel inflammation. Vascular/Lymphatic: Aortic atherosclerosis. No pathologically enlarged abdominal or pelvic lymph nodes. Reproductive: Status post hysterectomy. No adnexal masses. Other: Similar diffuse subcutaneous edema with small volume abdominopelvic ascites. Musculoskeletal: No acute osseous abnormality. IMPRESSION: 1. Similar diffuse subcutaneous edema, small volume abdominopelvic ascites and bilateral pleural effusions suggestive of third spacing. 2. No new abnormal finding in the abdomen or pelvis. 3. Stable right adrenal nodules measuring up to 1.4 cm in favored to  reflect benign adrenal adenomas, suggest more definitive characterization with nonemergent adrenal protocol CT with and without contrast. 4.  Aortic Atherosclerosis (ICD10-I70.0). Electronically Signed   By: Dahlia Bailiff M.D.   On: 04/06/2022 13:01   CT RENAL BIOPSY  Result Date: 03/23/2022 INDICATION: Unexplained acute kidney injury, hypertension, diabetes, and lupus. EXAM: CT GUIDED CORE BIOPSY OF THE LEFT KIDNEY LOWER POLE CORTEX TECHNIQUE: Multidetector CT imaging of the MID ABDOMEN was performed following the standard protocol WITHOUT IV contrast. RADIATION DOSE REDUCTION: This exam was performed according to the departmental dose-optimization program which includes automated exposure control, adjustment of the mA and/or kV according to patient size and/or use of iterative reconstruction technique. MEDICATIONS: 1% LIDOCAINE LOCAL ANESTHESIA/SEDATION: Moderate (conscious) sedation was employed during this procedure. A total of Versed 0.5 mg and Fentanyl 125 mcg was administered intravenously by the radiology nurse. Total intra-service moderate Sedation Time: 15 minutes. The patient's level of consciousness and vital signs were monitored continuously by radiology nursing throughout the procedure under my direct supervision. COMPLICATIONS: None immediate. PROCEDURE: Informed written consent was  obtained from the patient after a thorough discussion of the procedural risks, benefits and alternatives. All questions were addressed. Maximal Sterile Barrier Technique was utilized including caps, mask, sterile gowns, sterile gloves, sterile drape, hand hygiene and skin antiseptic. A timeout was performed prior to the initiation of the procedure. previous imaging reviewed. patient position prone. noncontrast localization CT performed. the left kidney lower pole cortex was localized and marked for a posterolateral approach. Under sterile conditions local anesthesia, CT guidance was utilized to advance the 17 gauge 11.8 cm guide needle to the left kidney lower pole. Needle position confirmed with a CT. 2 18 gauge core biopsies obtained, 1 cm in length and a second 2 cm in length. These were placed in saline. Samples were solid and intact. Post procedure imaging demonstrates no hemorrhage or hematoma. Patient tolerated biopsy well. IMPRESSION: Successful CT-guided left kidney lower pole cortex core biopsy Electronically Signed   By: Jerilynn Mages.  Shick M.D.   On: 03/23/2022 12:49   VAS [place] UPPER EXTREMITY VENOUS DUPLEX  Result Date: 03/22/2022 UPPER VENOUS STUDY  Patient Name:  MARIEANN ZIPP  Date of Exam:   03/22/2022 Medical Rec #: [id number]     Accession #:    [id number] Date of Birth: Oct 30, 1960     Patient Gender: F Patient Age:   67 years Exam Location:  [facility] Procedure:      VAS [place] UPPER EXTREMITY VENOUS DUPLEX Referring Phys: Otelia Santee --------------------------------------------------------------------------------  Indications: Swelling Risk Factors: None identified. Comparison Study: No prior studies. Performing Technologist: Oliver Hum RVT  Examination Guidelines: A complete evaluation includes B-mode imaging, spectral Doppler, color Doppler, and power Doppler as needed of all accessible portions of each vessel. Bilateral testing is considered an integral part of a complete examination. Limited  examinations for reoccurring indications may be performed as noted.  Right Findings: +----------+------------+---------+-----------+----------+-------+ RIGHT     CompressiblePhasicitySpontaneousPropertiesSummary +----------+------------+---------+-----------+----------+-------+ Subclavian    Full       Yes       Yes                      +----------+------------+---------+-----------+----------+-------+  Left Findings: +----------+------------+---------+-----------+----------+-------+ LEFT      CompressiblePhasicitySpontaneousPropertiesSummary +----------+------------+---------+-----------+----------+-------+ IJV           Full       Yes       Yes                      +----------+------------+---------+-----------+----------+-------+  Subclavian    Full       Yes       Yes                      +----------+------------+---------+-----------+----------+-------+ Axillary      Full       Yes       Yes                      +----------+------------+---------+-----------+----------+-------+ Brachial      Full       Yes       Yes                      +----------+------------+---------+-----------+----------+-------+ Radial        Full                                          +----------+------------+---------+-----------+----------+-------+ Ulnar         Full                                          +----------+------------+---------+-----------+----------+-------+ Cephalic      Full                                          +----------+------------+---------+-----------+----------+-------+ Basilic       Full                                          +----------+------------+---------+-----------+----------+-------+  Summary:  Right: No evidence of thrombosis in the subclavian.  Left: No evidence of deep vein thrombosis in the upper extremity. No evidence of superficial vein thrombosis in the upper extremity.  *See table(s) above for measurements and  observations.  Diagnosing physician: Deitra Mayo MD Electronically signed by Deitra Mayo MD on 03/22/2022 at 4:57:13 PM.    Final    VAS [place] LOWER EXTREMITY VENOUS (DVT)  Result Date: 03/19/2022  Lower Venous DVT Study Patient Name:  NAZARIAH CADET  Date of Exam:   03/18/2022 Medical Rec #: [id number]     Accession #:    [id number] Date of Birth: Feb 26, 1961     Patient Gender: F Patient Age:   57 years Exam Location:  [facility] Procedure:      VAS [place] LOWER EXTREMITY VENOUS (DVT) Referring Phys: Eulogio Bear --------------------------------------------------------------------------------  Indications: Edema.  Risk Factors: Lupus. Limitations: Poor ultrasound/tissue interface. Comparison Study: No previous exams Performing Technologist: Jody Hill RVT, RDMS  Examination Guidelines: A complete evaluation includes B-mode imaging, spectral Doppler, color Doppler, and power Doppler as needed of all accessible portions of each vessel. Bilateral testing is considered an integral part of a complete examination. Limited examinations for reoccurring indications may be performed as noted. The reflux portion of the exam is performed with the patient in reverse Trendelenburg.  +--------+---------------+---------+-----------+----------+--------------------+ RIGHT   CompressibilityPhasicitySpontaneityPropertiesThrombus Aging       +--------+---------------+---------+-----------+----------+--------------------+ CFV     Full           Yes      Yes                                       +--------+---------------+---------+-----------+----------+--------------------+  SFJ     Full                                                              +--------+---------------+---------+-----------+----------+--------------------+ FV Prox Full           Yes      Yes                                       +--------+---------------+---------+-----------+----------+--------------------+ FV Mid  Full            Yes      Yes                                       +--------+---------------+---------+-----------+----------+--------------------+ FV      Full           Yes      Yes                                       Distal                                                                    +--------+---------------+---------+-----------+----------+--------------------+ PFV                    Yes      Yes                  patent by                                                                 color/doppler        +--------+---------------+---------+-----------+----------+--------------------+ POP     Full           Yes      Yes                                       +--------+---------------+---------+-----------+----------+--------------------+ PTV     Full                                                              +--------+---------------+---------+-----------+----------+--------------------+ PERO    Full                                                              +--------+---------------+---------+-----------+----------+--------------------+  Difficulty compressing CFV/SFJ due to patient bearing down due to discomfort.  +--------+---------------+---------+-----------+----------+--------------------+ LEFT    CompressibilityPhasicitySpontaneityPropertiesThrombus Aging       +--------+---------------+---------+-----------+----------+--------------------+ CFV     Full           Yes      Yes                                       +--------+---------------+---------+-----------+----------+--------------------+ SFJ     Full                                                              +--------+---------------+---------+-----------+----------+--------------------+ FV Prox Full           Yes      Yes                                       +--------+---------------+---------+-----------+----------+--------------------+ FV Mid  Full            Yes      Yes                                       +--------+---------------+---------+-----------+----------+--------------------+ FV                     Yes      Yes                  patent by            Distal                                               color/doppler        +--------+---------------+---------+-----------+----------+--------------------+ PFV     Full           Yes      Yes                                       +--------+---------------+---------+-----------+----------+--------------------+ POP     Full           Yes      Yes                                       +--------+---------------+---------+-----------+----------+--------------------+ PTV     Full                                                              +--------+---------------+---------+-----------+----------+--------------------+ PERO    Full                                                              +--------+---------------+---------+-----------+----------+--------------------+  Difficulty compressing CFV/SFJ due to patient bearing down due to discomfort.    Summary: BILATERAL: - No evidence of deep vein thrombosis seen in the lower extremities, bilaterally. -No evidence of popliteal cyst, bilaterally. -Diffuse subcutaneous edema, bilaterally.   *See table(s) above for measurements and observations. Electronically signed by Harold Barban MD on 03/19/2022 at 4:12:23 PM.    Final    US Renal  Result Date: 03/17/2022 CLINICAL DATA:  Acute kidney injury EXAM: RENAL / URINARY TRACT ULTRASOUND COMPLETE COMPARISON:  CT abdomen pelvis 03/17/2022 FINDINGS: Right Kidney: Renal measurements: 10.6 x 3.8 x 4.8 cm = volume: 100 mL. Echogenicity within normal limits. No mass or hydronephrosis visualized. Left Kidney: Renal measurements: 9.6 x 4.6 x 4.3 cm = volume: 98 mL. Echogenicity within normal limits. No mass or hydronephrosis visualized. Bladder: Appears normal for degree of bladder  distention. Other: Right pleural effusion noted. IMPRESSION: Decreased renal volume bilaterally.  No obstruction or mass. Right pleural effusion Electronically Signed   By: Franchot Gallo M.D.   On: 03/17/2022 12:21   CT CHEST ABDOMEN PELVIS WO CONTRAST  Result Date: 03/17/2022 CLINICAL DATA:  Abdominal pain, dizziness, vomiting EXAM: CT CHEST, ABDOMEN AND PELVIS WITHOUT CONTRAST TECHNIQUE: Multidetector CT imaging of the chest, abdomen and pelvis was performed following the standard protocol without IV contrast. RADIATION DOSE REDUCTION: This exam was performed according to the departmental dose-optimization program which includes automated exposure control, adjustment of the mA and/or kV according to patient size and/or use of iterative reconstruction technique. COMPARISON:  CT abdomen and pelvis done on 02/24/2022 FINDINGS: CT CHEST FINDINGS Cardiovascular: There are scattered calcifications seen thoracic aorta and its major branches. Small pericardial effusion is seen. Mediastinum/Nodes: No significant lymphadenopathy is seen. Lungs/Pleura: Small linear densities are seen in the lateral aspect of right upper lung field and posterior aspects of both lower lung fields. Small to moderate bilateral pleural effusions are seen, more so on the right side. There is no pneumothorax. Musculoskeletal: No acute findings are seen. CT ABDOMEN PELVIS FINDINGS Hepatobiliary: No focal abnormalities are seen in liver. There is no dilation of bile ducts. Gallbladder is unremarkable. Pancreas: No focal abnormalities are seen. Spleen: Spleen appears smaller than usual in size. Adrenals/Urinary Tract: There is 1.4 cm low-density nodule in right adrenal with no significant change suggesting possible adenoma. No follow-up is recommended. There is no hydronephrosis. There are no renal or ureteral stones. Urinary bladder is not distended. Stomach/Bowel: Stomach is unremarkable. Small bowel loops are not dilated. Appendix is difficult  to visualize. In image 86 of series 2, there is a small caliber tubular structure in right lower quadrant, possibly normal appendix. There is no focal pericecal inflammation. There is no significant wall thickening in colon. There is no pericolic stranding. Vascular/Lymphatic: Scattered arterial calcifications are seen. Reproductive: Uterus is not seen. Other: Small ascites is seen in pelvis. There is no pneumoperitoneum. Umbilical hernia with possible small amount of fluid is noted. There is diffuse edema in subcutaneous plane suggesting anasarca. Musculoskeletal: No acute findings are seen. IMPRESSION: Small-to-moderate bilateral pleural effusions, more so on the right side. Small pericardial effusion. There are small linear patchy densities in right upper lung field and both lower lung fields suggesting atelectasis. There is no evidence of intestinal obstruction or pneumoperitoneum. There is no hydronephrosis. Small ascites. There is diffuse edema in abdominal wall suggesting anasarca. 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 Scattered calcifications are seen in aorta and major branches. Electronically Signed   By: Royston Cowper  Rathinasamy M.D.   On: 03/17/2022 11:43   ECHOCARDIOGRAM COMPLETE  Result Date: 03/15/2022    ECHOCARDIOGRAM REPORT   Patient Name:   MYRKA SYLVA  Date of Exam: 03/15/2022 Medical Rec #:  [id number]     Height:       62.0 in Accession #:    [id number]    Weight:       137.2 lb Date of Birth:  Nov 24, 1960     BSA:          1.629 m Patient Age:    50 years      BP:           118/64 mmHg Patient Gender: F             HR:           97 bpm. Exam Location:  [address] Procedure: 2D Echo, Cardiac Doppler and Color Doppler Indications:    R60.9 Edema  History:        Patient has no prior history of Echocardiogram examinations.                 Risk Factors:Hypertension, Former Smoker and Dyslipidemia.                 Lupus. Anemia.  Sonographer:    Basilia Jumbo BS, RDCS  Referring Phys: [place]  1. Left ventricular ejection fraction, by estimation, is 60 to 65%. The left ventricle has normal function. The left ventricle has no regional wall motion abnormalities. Left ventricular diastolic parameters were normal.  2. Right ventricular systolic function is normal. The right ventricular size is normal. There is normal pulmonary artery systolic pressure. The estimated right ventricular systolic pressure is 69.6 mmHg.  3. The mitral valve is normal in structure. Mild mitral valve regurgitation. No evidence of mitral stenosis.  4. The aortic valve is normal in structure. Aortic valve regurgitation is not visualized. No aortic stenosis is present.  5. The inferior vena cava is normal in size with greater than 50% respiratory variability, suggesting right atrial pressure of 3 mmHg.  6. A small pericardial effusion is present. The pericardial effusion is anterior to the right ventricle and localized with largest diameter near the right atrium with compression of the right atrium. Moderate pleural effusion in the left lateral region.  There is < 25% respirophasic change in mitral valve inflow velocities. Normal IVC diameter. Consdier chest CT for further assessment of Right atrial compression. LIkely pericardial effusion but need to rule out other etiologies. FINDINGS  Left Ventricle: Left ventricular ejection fraction, by estimation, is 60 to 65%. The left ventricle has normal function. The left ventricle has no regional wall motion abnormalities. The left ventricular internal cavity size was normal in size. There is  no left ventricular hypertrophy. Left ventricular diastolic parameters were normal. Normal left ventricular filling pressure. Right Ventricle: The right ventricular size is normal. No increase in right ventricular wall thickness. Right ventricular systolic function is normal. There is normal pulmonary artery systolic pressure. The tricuspid regurgitant  velocity is 2.48 m/s, and  with an assumed right atrial pressure of 3 mmHg, the estimated right ventricular systolic pressure is 78.9 mmHg. Left Atrium: Left atrial size was normal in size. Right Atrium: Right atrial size was normal in size. Pericardium: A small pericardial effusion is present. The pericardial effusion is anterior to the right ventricle and localized near the right atrium. Mitral Valve: The mitral valve is normal in structure. Mild mitral  valve regurgitation. No evidence of mitral valve stenosis. Tricuspid Valve: The tricuspid valve is normal in structure. Tricuspid valve regurgitation is mild . No evidence of tricuspid stenosis. Aortic Valve: The aortic valve is normal in structure. Aortic valve regurgitation is not visualized. No aortic stenosis is present. Pulmonic Valve: The pulmonic valve was normal in structure. Pulmonic valve regurgitation is not visualized. No evidence of pulmonic stenosis. Aorta: The aortic root is normal in size and structure. Venous: The inferior vena cava is normal in size with greater than 50% respiratory variability, suggesting right atrial pressure of 3 mmHg. IAS/Shunts: No atrial level shunt detected by color flow Doppler. Additional Comments: There is a large pleural effusion in the left lateral region. Mild ascites is present.  LEFT VENTRICLE PLAX 2D LVIDd:         3.40 cm   Diastology LVIDs:         1.90 cm   LV e' medial:    12.60 cm/s LV PW:         1.10 cm   LV E/e' medial:  4.4 LV IVS:        0.80 cm   LV e' lateral:   10.70 cm/s LVOT diam:     1.90 cm   LV E/e' lateral: 5.2 LV SV:         52 LV SV Index:   32 LVOT Area:     2.84 cm  RIGHT VENTRICLE             IVC RV Basal diam:  2.50 cm     IVC diam: 1.20 cm RV S prime:     12.00 cm/s TAPSE (M-mode): 1.6 cm RVSP:           27.6 mmHg LEFT ATRIUM             Index        RIGHT ATRIUM           Index LA diam:        2.40 cm 1.47 cm/m   RA Pressure: 3.00 mmHg LA Vol (A2C):   21.3 ml 13.08 ml/m  RA Area:      6.47 cm LA Vol (A4C):   21.5 ml 13.20 ml/m  RA Volume:   9.71 ml   5.96 ml/m LA Biplane Vol: 21.5 ml 13.20 ml/m  AORTIC VALVE LVOT Vmax:   110.00 cm/s LVOT Vmean:  72.200 cm/s LVOT VTI:    0.182 m  AORTA Ao Root diam: 2.60 cm Ao Asc diam:  2.60 cm MITRAL VALVE               TRICUSPID VALVE                            TR Peak grad:   24.6 mmHg MV Decel Time: 225 msec    TR Vmax:        248.00 cm/s MV E velocity: 55.40 cm/s  Estimated RAP:  3.00 mmHg MV A velocity: 64.40 cm/s  RVSP:           27.6 mmHg MV E/A ratio:  0.86                            SHUNTS                            Systemic VTI:  0.18  m                            Systemic Diam: 1.90 cm Fransico Him MD Electronically signed by Fransico Him MD Signature Date/Time: 03/15/2022/2:06:20 PM    Final    (Echo, Carotid, EGD, Colonoscopy, ERCP)    Subjective: Patient seen and examined.  Mostly occasional mild pain.  Is scared of getting severe pain.  No other events.  Able to eat regular diet.  Normal bowel function. Discussed in detail about all her tests and explained. She did have improvement on her leg swellings with diuretics in the past.   Discharge Exam: Vitals:   04/11/22 0511 04/11/22 1222  BP: 114/80 123/87  Pulse: 97 (!) 104  Resp: 18 18  Temp: 98.3 F (36.8 C) 98 F (36.7 C)  SpO2: 96% 97%   Vitals:   04/10/22 1237 04/10/22 1949 04/11/22 0511 04/11/22 1222  BP: 115/79 134/83 114/80 123/87  Pulse: (!) 101 89 97 (!) 104  Resp: '20 18 18 18  '$ Temp: 98.2 F (36.8 C) 98.5 F (36.9 C) 98.3 F (36.8 C) 98 F (36.7 C)  TempSrc: Oral Oral  Oral  SpO2: 99% 93% 96% 97%  Weight:   71.6 kg   Height:        General: Pt is alert, awake, not in acute distress, on room air. Cardiovascular: RRR, S1/S2 +, no rubs, no gallops Respiratory: CTA bilaterally, no wheezing, no rhonchi Abdominal: Soft, NT, ND, bowel sounds + Extremities: 1+ edema both legs.  Pitting edema dorsum of the left hand.    The results of significant  diagnostics from this hospitalization (including imaging, microbiology, ancillary and laboratory) are listed below for reference.     Microbiology: No results found for this or any previous visit (from the past 240 hour(s)).   Labs: BNP (last 3 results) Recent Labs    02/16/22 1118 03/17/22 1000  BNP 14.7 00.8   Basic Metabolic Panel: Recent Labs  Lab 04/06/22 1110 04/07/22 0544 04/07/22 1253 04/08/22 1357 04/10/22 0725  NA 136 140  --   --  135  K 3.2* 3.5  --   --  3.8  CL 89* 100  --   --  101  CO2 34* 33*  --   --  26  GLUCOSE 186* 112* 130* >600* 134*  BUN 25* 17  --   --  10  CREATININE 1.05* 0.97  --   --  0.82  CALCIUM 9.5 9.0  --   --  8.6*  MG  --  1.8  --   --   --   PHOS  --  3.6  --   --   --    Liver Function Tests: Recent Labs  Lab 04/06/22 1110 04/07/22 0544  AST 52* 36  ALT 108* 68*  ALKPHOS 61 50  BILITOT 0.6 0.6  PROT 6.8 5.5*  ALBUMIN 3.8 3.0*   Recent Labs  Lab 04/06/22 1110  LIPASE 37   No results for input(s): "AMMONIA" in the last 168 hours. CBC: Recent Labs  Lab 04/06/22 1110 04/07/22 0544  WBC 22.9* 9.1  NEUTROABS 19.7*  --   HGB 14.1 12.7  HCT 41.2 38.8  MCV 96.5 100.3*  PLT 1,150* 745*   Cardiac Enzymes: No results for input(s): "CKTOTAL", "CKMB", "CKMBINDEX", "TROPONINI" in the last 168 hours. BNP: Invalid input(s): "POCBNP" CBG: Recent Labs  Lab 04/10/22 1133 04/10/22 1630 04/10/22 2206 04/11/22  0755 04/11/22 1221  GLUCAP 196* 174* 159* 113* 151*   D-Dimer No results for input(s): "DDIMER" in the last 72 hours. Hgb A1c No results for input(s): "HGBA1C" in the last 72 hours. Lipid Profile No results for input(s): "CHOL", "HDL", "LDLCALC", "TRIG", "CHOLHDL", "LDLDIRECT" in the last 72 hours. Thyroid function studies No results for input(s): "TSH", "T4TOTAL", "T3FREE", "THYROIDAB" in the last 72 hours.  Invalid input(s): "FREET3" Anemia work up No results for input(s): "VITAMINB12", "FOLATE",  "FERRITIN", "TIBC", "IRON", "RETICCTPCT" in the last 72 hours. Urinalysis    Component Value Date/Time   COLORURINE YELLOW 04/06/2022 1040   APPEARANCEUR CLEAR 04/06/2022 1040   LABSPEC 1.013 04/06/2022 1040   PHURINE 8.0 04/06/2022 1040   GLUCOSEU NEGATIVE 04/06/2022 1040   HGBUR NEGATIVE 04/06/2022 1040   BILIRUBINUR NEGATIVE 04/06/2022 1040   BILIRUBINUR negative 11/24/2016 1637   KETONESUR NEGATIVE 04/06/2022 1040   PROTEINUR NEGATIVE 04/06/2022 1040   UROBILINOGEN 0.2 11/24/2016 1637   NITRITE NEGATIVE 04/06/2022 1040   LEUKOCYTESUR SMALL (A) 04/06/2022 1040   Sepsis Labs Recent Labs  Lab 04/06/22 1110 04/07/22 0544  WBC 22.9* 9.1   Microbiology No results found for this or any previous visit (from the past 240 hour(s)).   Time coordinating discharge: 35 minutes  SIGNED:   Barb Merino, MD  [facility] 04/11/2022, 1:25 PM

## 2022-04-11 NOTE — Plan of Care (Signed)

## 2022-04-11 NOTE — Progress Notes (Signed)
Mobility Specialist - Progress Note   04/11/22 0859  Mobility  Activity Ambulated with assistance in hallway  Level of Assistance Contact guard assist, steadying assist  Assistive Device None  Distance Ambulated (ft) 100 ft  Activity Response Tolerated well  $Mobility charge 1 Mobility   Pt received in bed and agreed for mobility, some discomfort in legs. Pt returned to bed with all needs met.   Roderick Pee Mobility Specialist

## 2022-04-12 NOTE — Patient Outreach (Signed)
[name] 09-07-1960 [id number]   [facility] [ACO] Patient: Alyssa Rhodes PPO  Primary Care Provider:  Doretha Sou, MD, is with [facility], this is not a [facility] provider on the [facility] roster.   Called the patient regarding post hospital follow up, HIPPA confirmed. Patient endorses [facility] and that she is no longer with [facility] [a [facility] provider].   Plan:  Continue to follow progress and disposition to assess for post hospital care management needs.    For questions contact:   Natividad Brood, RN BSN [facility] Liaison  [phone] business mobile phone Toll free office [phone]  Fax number: [phone] [email or web address]

## 2022-04-14 ENCOUNTER — Encounter: Payer: Self-pay | Admitting: Radiology

## 2022-04-14 ENCOUNTER — Ambulatory Visit: Payer: BC Managed Care – PPO | Admitting: Radiology

## 2022-04-14 VITALS — BP 122/76

## 2022-04-14 DIAGNOSIS — N952 Postmenopausal atrophic vaginitis: Secondary | ICD-10-CM

## 2022-04-14 DIAGNOSIS — K828 Other specified diseases of gallbladder: Secondary | ICD-10-CM | POA: Diagnosis not present

## 2022-04-14 DIAGNOSIS — R1011 Right upper quadrant pain: Secondary | ICD-10-CM | POA: Diagnosis not present

## 2022-04-14 MED ORDER — ESTRADIOL 0.1 MG/GM VA CREA: 1.0000 g | TOPICAL_CREAM | VAGINAL | 11 refills | Status: AC

## 2022-04-14 NOTE — Progress Notes (Signed)
      Subjective: Alyssa Rhodes is a 61 y.o. female here for follow up vulvovaginal atrophy. Was doing well on estrace cream and pharmacy would not refill, has been off x 4 weeks. Recently hospitalized for severe lower extremity edema and pain. No cause found. Was d/c'd 3 days ago. She is now having a cough with some labored breathing. Has appt scheduled with cardiologist.    Review of Systems  Constitutional:  Positive for fatigue and unexpected weight change.  Respiratory:  Positive for cough and shortness of breath.   Cardiovascular:  Positive for chest pain and leg swelling.  Gastrointestinal:  Positive for abdominal distention.  Musculoskeletal:  Positive for back pain.    Past Medical History:  Diagnosis Date   Diabetes mellitus without complication ([place])    Hyperlipidemia    Hypertension    Lupus ([place])    Thyroid nodule 03/2010   aspiration      Objective:  Today's Vitals   04/14/22 0748  BP: 122/76   There is no height or weight on file to calculate BMI.   -General: no acute distress, slightly labored breathing -Vulva: swelling of the mons and labia -Vagina: atrophy slightly improved, no d/c present -Cervix: absent -Perineum: no lesions -Uterus: absent -Adnexa: no masses or tenderness  Chaperone offered and declined.  Assessment:/Plan:  1. Vaginal atrophy  - estradiol (ESTRACE) 0.1 MG/GM vaginal cream; Place 1 g vaginally 3 (three) times a week.  Dispense: 42.5 g; Refill: 11   Follow up with cardiology to see if she can get a sooner appt, if swelling or breathing worsen back to ER

## 2022-04-15 DIAGNOSIS — J90 Pleural effusion, not elsewhere classified: Secondary | ICD-10-CM | POA: Diagnosis not present

## 2022-04-15 DIAGNOSIS — R601 Generalized edema: Secondary | ICD-10-CM | POA: Diagnosis not present

## 2022-04-15 DIAGNOSIS — M7989 Other specified soft tissue disorders: Secondary | ICD-10-CM | POA: Diagnosis not present

## 2022-04-15 DIAGNOSIS — Z6825 Body mass index (BMI) 25.0-25.9, adult: Secondary | ICD-10-CM | POA: Diagnosis not present

## 2022-04-15 DIAGNOSIS — Z09 Encounter for follow-up examination after completed treatment for conditions other than malignant neoplasm: Secondary | ICD-10-CM | POA: Diagnosis not present

## 2022-04-16 DIAGNOSIS — R9431 Abnormal electrocardiogram [ECG] [EKG]: Secondary | ICD-10-CM | POA: Diagnosis not present

## 2022-04-16 DIAGNOSIS — R1011 Right upper quadrant pain: Secondary | ICD-10-CM | POA: Diagnosis not present

## 2022-04-16 DIAGNOSIS — R0602 Shortness of breath: Secondary | ICD-10-CM | POA: Diagnosis not present

## 2022-04-20 ENCOUNTER — Encounter: Payer: BC Managed Care – PPO | Admitting: Registered"

## 2022-04-22 DIAGNOSIS — H524 Presbyopia: Secondary | ICD-10-CM | POA: Diagnosis not present

## 2022-04-22 DIAGNOSIS — H25813 Combined forms of age-related cataract, bilateral: Secondary | ICD-10-CM | POA: Diagnosis not present

## 2022-04-22 DIAGNOSIS — H40021 Open angle with borderline findings, high risk, right eye: Secondary | ICD-10-CM | POA: Diagnosis not present

## 2022-04-22 DIAGNOSIS — H40012 Open angle with borderline findings, low risk, left eye: Secondary | ICD-10-CM | POA: Diagnosis not present

## 2022-04-22 DIAGNOSIS — N179 Acute kidney failure, unspecified: Secondary | ICD-10-CM | POA: Diagnosis not present

## 2022-04-22 DIAGNOSIS — E119 Type 2 diabetes mellitus without complications: Secondary | ICD-10-CM | POA: Diagnosis not present

## 2022-04-22 NOTE — Anesthesia Postprocedure Evaluation (Signed)
Anesthesia Post Note  Patient: COCO SHARPNACK  Procedure(s) Performed: ESOPHAGOGASTRODUODENOSCOPY (EGD) WITH PROPOFOL     Patient location during evaluation: PACU Anesthesia Type: MAC Level of consciousness: awake and alert Pain management: pain level controlled Vital Signs Assessment: post-procedure vital signs reviewed and stable Respiratory status: spontaneous breathing, nonlabored ventilation, respiratory function stable and patient connected to nasal cannula oxygen Cardiovascular status: stable and blood pressure returned to baseline Postop Assessment: no apparent nausea or vomiting Anesthetic complications: no   No notable events documented.  Last Vitals:  Vitals:   04/11/22 0511 04/11/22 1222  BP: 114/80 123/87  Pulse: 97 (!) 104  Resp: 18 18  Temp: 36.8 C 36.7 C  SpO2: 96% 97%    Last Pain:  Vitals:   04/11/22 1222  TempSrc: Oral  PainSc:                  [place]

## 2022-04-23 DIAGNOSIS — I10 Essential (primary) hypertension: Secondary | ICD-10-CM | POA: Diagnosis not present

## 2022-04-23 DIAGNOSIS — L03119 Cellulitis of unspecified part of limb: Secondary | ICD-10-CM | POA: Diagnosis not present

## 2022-04-23 DIAGNOSIS — Z79899 Other long term (current) drug therapy: Secondary | ICD-10-CM | POA: Diagnosis not present

## 2022-04-23 DIAGNOSIS — Z09 Encounter for follow-up examination after completed treatment for conditions other than malignant neoplasm: Secondary | ICD-10-CM | POA: Diagnosis not present

## 2022-04-23 DIAGNOSIS — I890 Lymphedema, not elsewhere classified: Secondary | ICD-10-CM | POA: Diagnosis not present

## 2022-04-23 DIAGNOSIS — J90 Pleural effusion, not elsewhere classified: Secondary | ICD-10-CM | POA: Diagnosis not present

## 2022-04-23 DIAGNOSIS — Z6825 Body mass index (BMI) 25.0-25.9, adult: Secondary | ICD-10-CM | POA: Diagnosis not present

## 2022-04-23 DIAGNOSIS — N179 Acute kidney failure, unspecified: Secondary | ICD-10-CM | POA: Diagnosis not present

## 2022-04-27 ENCOUNTER — Encounter: Payer: Self-pay | Admitting: Internal Medicine

## 2022-04-27 ENCOUNTER — Ambulatory Visit: Payer: BC Managed Care – PPO | Attending: Internal Medicine | Admitting: Internal Medicine

## 2022-04-27 VITALS — BP 122/80 | HR 100 | Ht 62.0 in | Wt 158.0 lb

## 2022-04-27 DIAGNOSIS — I10 Essential (primary) hypertension: Secondary | ICD-10-CM | POA: Diagnosis not present

## 2022-04-27 DIAGNOSIS — M069 Rheumatoid arthritis, unspecified: Secondary | ICD-10-CM

## 2022-04-27 DIAGNOSIS — D8989 Other specified disorders involving the immune mechanism, not elsewhere classified: Secondary | ICD-10-CM | POA: Diagnosis not present

## 2022-04-27 DIAGNOSIS — R0602 Shortness of breath: Secondary | ICD-10-CM | POA: Diagnosis not present

## 2022-04-27 DIAGNOSIS — R601 Generalized edema: Secondary | ICD-10-CM

## 2022-04-27 DIAGNOSIS — J90 Pleural effusion, not elsewhere classified: Secondary | ICD-10-CM | POA: Diagnosis not present

## 2022-04-27 DIAGNOSIS — M7989 Other specified soft tissue disorders: Secondary | ICD-10-CM | POA: Diagnosis not present

## 2022-04-27 DIAGNOSIS — Z87891 Personal history of nicotine dependence: Secondary | ICD-10-CM | POA: Diagnosis not present

## 2022-04-27 DIAGNOSIS — F17210 Nicotine dependence, cigarettes, uncomplicated: Secondary | ICD-10-CM | POA: Diagnosis not present

## 2022-04-27 NOTE — Patient Instructions (Addendum)
Medication Instructions:  No Changes In Medications at this time.  *If you need a refill on your cardiac medications before your next appointment, please call your pharmacy*  Lab Work: None Ordered At This Time.  If you have labs (blood work) drawn today and your tests are completely normal, you will receive your results only by: [place] (if you have MyChart) OR A paper copy in the mail If you have any lab test that is abnormal or we need to change your treatment, we will call you to review the results.  Testing/Procedures: None Ordered At This Time.   Follow-Up: At [facility], you and your health needs are our priority.  As part of our continuing mission to provide you with exceptional heart care, we have created designated Provider Care Teams.  These Care Teams include your primary Cardiologist (physician) and Advanced Practice Providers (APPs -  Physician Assistants and Nurse Practitioners) who all work together to provide you with the care you need, when you need it.  Your next appointment:   AS NEEDED   The format for your next appointment:   In Person  Provider:   Janina Mayo, MD    REFERRAL ADDED FOR HEMATOLOGY/ONCOLOGY- PATIENT AWARE PER Dr. Harl Bowie

## 2022-04-27 NOTE — Addendum Note (Signed)
Addended by: Rexanne Mano B on: 04/27/2022 02:34 PM   Modules accepted: Orders

## 2022-04-27 NOTE — Progress Notes (Addendum)
Cardiology Office Note:    Date:  04/27/2022   ID:  Alyssa Rhodes, DOB 1961/06/01, MRN [id number]  PCP:  Doretha Sou, MD   [facility] Providers Cardiologist:  Janina Mayo, MD     Referring MD: Maximiano Coss, NP   Chief Complaint  Patient presents with   Follow-up  Abnormal EKG  History of Present Illness:    Alyssa Rhodes is a 61 y.o. 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 She has no hx of cardiac dx. She notes LE swelling since late May. Swelling progresses during the day. In the AM it is better. No orthopnea or PND. She has normal renal function. Father had heart disease.  Interim hx: Trialed compression stockings and patient notes they are tight. She also notes abdominal and arm swelling. She has swelling in her feet, legs and L arm.  This has been going on since May.This has progressed since she was here. She had recent labs , crt is normal. Urine showed normal microalb creatinine ratio. TSH is normal.  She still denies orthopnea or PND  Interim Hx 04/27/2022  She notes weight gain still. It is mainly her legs BL and L arm. Her echo was normal and BNP was low. She had a normal mammogram in May which was normal. She had kappa/lamda LC ratio elevation 2.7, no M spike observed.    Wt Readings from Last 3 Encounters:  04/27/22 158 lb (71.7 kg)  04/11/22 157 lb 13.6 oz (71.6 kg)  03/28/22 134 lb 11.2 oz (61.1 kg)    Active Ambulatory Problems    Diagnosis Date Noted   HTN (hypertension) 10/18/2011   Tobacco user 10/18/2011   Microcytic anemia 12/17/2019   Other fatigue    Deficiency anemia    Iron deficiency anemia 12/18/2019   AKI (acute kidney injury) ([place]) 03/17/2022   Abdominal pain 04/06/2022   Reactive thrombocytosis 04/06/2022   Leucocytosis 04/06/2022   Anasarca 04/06/2022   Type 2 diabetes  mellitus with complication, without long-term current use of insulin ([place]) 04/06/2022   Rheumatoid arthritis ([place]) 04/27/2022   Resolved Ambulatory Problems    Diagnosis Date Noted   Lupus ([place])    Past Medical History:  Diagnosis Date   Diabetes mellitus without complication ([place])    Hyperlipidemia    Hypertension    Thyroid nodule 03/2010      Past Surgical History:  Procedure Laterality Date   ABDOMINAL HYSTERECTOMY     CESAREAN SECTION     ESOPHAGOGASTRODUODENOSCOPY (EGD) WITH PROPOFOL N/A 04/08/2022   Procedure: ESOPHAGOGASTRODUODENOSCOPY (EGD) WITH PROPOFOL;  Surgeon: Clarene Essex, MD;  Location: [facility];  Service: Gastroenterology;  Laterality: N/A;   SHOULDER SURGERY  2011   LEFT    Current Medications: Current Meds  Medication Sig   Ascorbic Acid (VITAMIN C PO) Take 1 tablet by mouth daily.   atorvastatin (LIPITOR) 40 MG tablet Take 1 tablet (40 mg total) by mouth daily.   doxycycline (VIBRA-TABS) 100 MG tablet Take 100 mg by mouth 2 (two) times daily.   estradiol (ESTRACE) 0.1 MG/GM vaginal cream Place 1 g vaginally 3 (three) times a week.   folic acid (FOLVITE) 1 MG tablet Take 1 mg by mouth in the morning.   furosemide (LASIX) 40 MG tablet Take 1 tablet (40 mg total) by mouth 2 (two) times daily.  gabapentin (NEURONTIN) 300 MG capsule Take 1 capsule (300 mg total) by mouth daily.   metFORMIN (GLUCOPHAGE) 500 MG tablet Take 1 tablet (500 mg total) by mouth daily with breakfast.   methotrexate 2.5 MG tablet Take 4 tablets (10 mg total) by mouth every Wednesday. Please do not resume until cleared to do so by the kidney doctor at follow-up   Multiple Vitamin (MULTIVITAMIN) tablet Take 1 tablet by mouth daily with breakfast.   Omega-3 Fatty Acids (FISH OIL PO) Take 1 capsule by mouth in the morning and at bedtime.   omeprazole (PRILOSEC) 40 MG capsule Take 40 mg by mouth daily before breakfast.   ondansetron (ZOFRAN) 4 MG tablet Take 4 mg by mouth 3 (three) times  daily as needed for nausea or vomiting.   oxyCODONE (OXY IR/ROXICODONE) 5 MG immediate release tablet Take 1 tablet (5 mg total) by mouth every 4 (four) hours as needed for moderate pain.   polyethylene glycol (MIRALAX / GLYCOLAX) 17 g packet Take 17 g by mouth daily.   potassium chloride (KLOR-CON) 20 MEQ packet Take 20 mEq by mouth daily.     Allergies:   Codeine and Hydroxychloroquine   Social History   Socioeconomic History   Marital status: Widowed    Spouse name: Not on file   Number of children: 2   Years of education: Not on file   Highest education level: Not on file  Occupational History   Occupation: accountant  Tobacco Use   Smoking status: Former    Packs/day: 1.00    Years: 41.00    Total pack years: 41.00    Types: Cigarettes   Smokeless tobacco: Never  Vaping Use   Vaping Use: Never used  Substance and Sexual Activity   Alcohol use: Not Currently    Alcohol/week: 3.0 standard drinks of alcohol    Types: 3 Standard drinks or equivalent per week    Comment: 3/week   Drug use: No   Sexual activity: Not Currently    Partners: Male  Other Topics Concern   Not on file  Social History Narrative   Not on file   Social Determinants of Health   Financial Resource Strain: Low Risk  (11/19/2018)   Overall Financial Resource Strain (CARDIA)    Difficulty of Paying Living Expenses: Not hard at all  Food Insecurity: No Food Insecurity (04/06/2022)   Hunger Vital Sign    Worried About Running Out of Food in the Last Year: Never true    Ran Out of Food in the Last Year: Never true  Transportation Needs: No Transportation Needs (04/06/2022)   PRAPARE - Hydrologist (Medical): No    Lack of Transportation (Non-Medical): No  Physical Activity: Inactive (11/19/2018)   Exercise Vital Sign    Days of Exercise per Week: 0 days    Minutes of Exercise per Session: 0 min  Stress: No Stress Concern Present (11/19/2018)   [place]    Feeling of Stress : Only a little  Social Connections: Moderately Isolated (11/19/2018)   Social Connection and Isolation Panel [NHANES]    Frequency of Communication with Friends and Family: Never    Frequency of Social Gatherings with Friends and Family: Never    Attends Religious Services: More than 4 times per year    Active Member of [employer] or Organizations: No    Attends Archivist Meetings: Never    Marital Status: Widowed  Family History: The patient's family history includes Asthma in her mother; Diabetes in her mother; Heart disease in her father; Hypertension in her father.  ROS:   Please see the history of present illness.     All other systems reviewed and are negative.  EKGs/Labs/Other Studies Reviewed:    The following studies were reviewed today:   EKG:  EKG is  ordered today.  The ekg ordered today demonstrates   01/06/2022-Sinus tachycardia 105 bpm  Recent Labs: 03/17/2022: B Natriuretic Peptide 25.6 03/18/2022: TSH 1.199 04/07/2022: ALT 68; Hemoglobin 12.7; Magnesium 1.8; Platelets 745 04/10/2022: BUN 10; Creatinine, Ser 0.82; Potassium 3.8; Sodium 135   Recent Lipid Panel    Component Value Date/Time   CHOL 118 02/04/2022 0818   TRIG 69 02/04/2022 0818   HDL 38 (L) 02/04/2022 0818   CHOLHDL 3.1 02/04/2022 0818   CHOLHDL 4 12/29/2021 0924   VLDL 21.6 12/29/2021 0924   LDLCALC 66 02/04/2022 0818     Risk Assessment/Calculations:           Physical Exam:    VS:   Vitals:   04/27/22 0817  BP: 122/80  Pulse: 100  SpO2: 94%     Wt Readings from Last 3 Encounters:  04/27/22 158 lb (71.7 kg)  04/11/22 157 lb 13.6 oz (71.6 kg)  03/28/22 134 lb 11.2 oz (61.1 kg)     GEN:  Well nourished, well developed in no acute distress HEENT: Normal NECK: No JVD; No carotid bruits LYMPHATICS: No lymphadenopathy CARDIAC: RRR, no murmurs, rubs, gallops RESPIRATORY:  Clear to  auscultation without rales, wheezing or rhonchi  ABDOMEN: Soft, non-tender, distended with pitting flanks MUSCULOSKELETAL:  edema L arm, pitting edema BL feet to thighs SKIN: Warm and dry NEUROLOGIC:  Alert and oriented x 3 PSYCHIATRIC:  Normal affect   ASSESSMENT:     Anasarca: non cardiac related.  She had normal BNP and, TTE showed normal LV function, no valve dx. Small pericardial effusion but this is not contributing. She does not have heart failure. Ddx includes cirrhosis, but this was not seen on her CT scan.  A malignancy is possible, however, she had a normal mammogram in May. Uptodate with her colon CA (negative cologaurd in June) screening. She had 1.4 cm low-density nodule on the R adrenal gland that is c/f adenoma.Last visit stopped her norvasc all together since it is associated with swelling. Her albumin in June was normal, this is not related. No signs of nephrotic syndrome on spot prot/crt ratio. Her Kappa/lamda ratio is elevated, no M spike. This may be lymphedema related to malignancy.  CVD risk mitigation:   HLD: continue lipitor 40 mg daily.  LDL 127 mg/dL; goal < 100 mg/dL, LDL 66 at goal  A1c: 7.5; goal < 7  HTN: changed amlodipine 10- benazapril 20 mg daily ( she was also prescribed amlodipine 5; stopped this) to amlodipine 10 mg- olmesartan 40 mg daily and added chlorthalidone 25 mg daily. Will stop norvasc all together. Will continue olmesartan 40 mg. Bps well controlled  Smoking: quit, encouraging to continue    PLAN:    In order of problems listed above:   Non cardiac, no further cardiac w/u Recommend referral heme/onc to consider BM biopsy   Medication Adjustments/Labs and Tests Ordered: Current medicines are reviewed at length with the patient today.  Concerns regarding medicines are outlined above.  No orders of the defined types were placed in this encounter.  No orders of the defined types were placed in  this encounter.   Patient Instructions   Medication Instructions:  No Changes In Medications at this time.  *If you need a refill on your cardiac medications before your next appointment, please call your pharmacy*  Lab Work: None Ordered At This Time.  If you have labs (blood work) drawn today and your tests are completely normal, you will receive your results only by: [place] (if you have MyChart) OR A paper copy in the mail If you have any lab test that is abnormal or we need to change your treatment, we will call you to review the results.  Testing/Procedures: None Ordered At This Time.   Follow-Up: At [facility], you and your health needs are our priority.  As part of our continuing mission to provide you with exceptional heart care, we have created designated Provider Care Teams.  These Care Teams include your primary Cardiologist (physician) and Advanced Practice Providers (APPs -  Physician Assistants and Nurse Practitioners) who all work together to provide you with the care you need, when you need it.  Your next appointment:   AS NEEDED   The format for your next appointment:   In Person  Provider:   Janina Mayo, MD          Signed, Janina Mayo, MD  04/27/2022 11:03 AM    [place]

## 2022-05-05 ENCOUNTER — Ambulatory Visit: Payer: BC Managed Care – PPO | Admitting: Hematology and Oncology

## 2022-05-05 DIAGNOSIS — E663 Overweight: Secondary | ICD-10-CM | POA: Diagnosis not present

## 2022-05-05 DIAGNOSIS — Z87891 Personal history of nicotine dependence: Secondary | ICD-10-CM | POA: Diagnosis not present

## 2022-05-05 DIAGNOSIS — Z6828 Body mass index (BMI) 28.0-28.9, adult: Secondary | ICD-10-CM | POA: Diagnosis not present

## 2022-05-05 DIAGNOSIS — I890 Lymphedema, not elsewhere classified: Secondary | ICD-10-CM | POA: Diagnosis not present

## 2022-05-05 DIAGNOSIS — R0602 Shortness of breath: Secondary | ICD-10-CM | POA: Diagnosis not present

## 2022-05-05 DIAGNOSIS — M7989 Other specified soft tissue disorders: Secondary | ICD-10-CM | POA: Diagnosis not present

## 2022-05-12 DIAGNOSIS — J90 Pleural effusion, not elsewhere classified: Secondary | ICD-10-CM | POA: Diagnosis not present

## 2022-05-12 DIAGNOSIS — I7300 Raynaud's syndrome without gangrene: Secondary | ICD-10-CM | POA: Diagnosis not present

## 2022-05-12 DIAGNOSIS — I10 Essential (primary) hypertension: Secondary | ICD-10-CM | POA: Diagnosis not present

## 2022-05-12 DIAGNOSIS — Z720 Tobacco use: Secondary | ICD-10-CM | POA: Diagnosis not present

## 2022-05-13 DIAGNOSIS — R601 Generalized edema: Secondary | ICD-10-CM | POA: Diagnosis not present

## 2022-05-13 DIAGNOSIS — R1011 Right upper quadrant pain: Secondary | ICD-10-CM | POA: Diagnosis not present

## 2022-05-13 DIAGNOSIS — J90 Pleural effusion, not elsewhere classified: Secondary | ICD-10-CM | POA: Diagnosis not present

## 2022-05-16 LAB — SURGICAL PATHOLOGY

## 2022-05-17 DIAGNOSIS — I7300 Raynaud's syndrome without gangrene: Secondary | ICD-10-CM | POA: Diagnosis not present

## 2022-05-17 DIAGNOSIS — M0579 Rheumatoid arthritis with rheumatoid factor of multiple sites without organ or systems involvement: Secondary | ICD-10-CM | POA: Diagnosis not present

## 2022-05-17 DIAGNOSIS — M19041 Primary osteoarthritis, right hand: Secondary | ICD-10-CM | POA: Diagnosis not present

## 2022-05-17 DIAGNOSIS — R768 Other specified abnormal immunological findings in serum: Secondary | ICD-10-CM | POA: Diagnosis not present

## 2022-05-20 DIAGNOSIS — E785 Hyperlipidemia, unspecified: Secondary | ICD-10-CM | POA: Diagnosis not present

## 2022-05-20 DIAGNOSIS — I129 Hypertensive chronic kidney disease with stage 1 through stage 4 chronic kidney disease, or unspecified chronic kidney disease: Secondary | ICD-10-CM | POA: Diagnosis not present

## 2022-05-20 DIAGNOSIS — R601 Generalized edema: Secondary | ICD-10-CM | POA: Diagnosis not present

## 2022-05-20 DIAGNOSIS — N179 Acute kidney failure, unspecified: Secondary | ICD-10-CM | POA: Diagnosis not present

## 2022-05-21 DIAGNOSIS — I10 Essential (primary) hypertension: Secondary | ICD-10-CM | POA: Diagnosis not present

## 2022-05-21 DIAGNOSIS — Z79899 Other long term (current) drug therapy: Secondary | ICD-10-CM | POA: Diagnosis not present

## 2022-05-21 DIAGNOSIS — E119 Type 2 diabetes mellitus without complications: Secondary | ICD-10-CM | POA: Diagnosis not present

## 2022-05-21 DIAGNOSIS — Z6828 Body mass index (BMI) 28.0-28.9, adult: Secondary | ICD-10-CM | POA: Diagnosis not present

## 2022-05-21 DIAGNOSIS — E7800 Pure hypercholesterolemia, unspecified: Secondary | ICD-10-CM | POA: Diagnosis not present

## 2022-05-21 DIAGNOSIS — M7989 Other specified soft tissue disorders: Secondary | ICD-10-CM | POA: Diagnosis not present

## 2022-05-23 ENCOUNTER — Emergency Department (HOSPITAL_COMMUNITY): Payer: BC Managed Care – PPO

## 2022-05-23 ENCOUNTER — Inpatient Hospital Stay (HOSPITAL_COMMUNITY)
Admission: EM | Admit: 2022-05-23 | Discharge: 2022-06-08 | DRG: 186 | Disposition: A | Discharge status: Home / Self Care | Payer: BC Managed Care – PPO | Attending: Internal Medicine | Admitting: Internal Medicine

## 2022-05-23 ENCOUNTER — Ambulatory Visit: Payer: BC Managed Care – PPO | Admitting: Registered"

## 2022-05-23 ENCOUNTER — Other Ambulatory Visit: Payer: Self-pay

## 2022-05-23 DIAGNOSIS — D509 Iron deficiency anemia, unspecified: Secondary | ICD-10-CM | POA: Diagnosis present

## 2022-05-23 DIAGNOSIS — I3139 Other pericardial effusion (noninflammatory): Secondary | ICD-10-CM | POA: Diagnosis present

## 2022-05-23 DIAGNOSIS — R112 Nausea with vomiting, unspecified: Secondary | ICD-10-CM | POA: Diagnosis not present

## 2022-05-23 DIAGNOSIS — E43 Unspecified severe protein-calorie malnutrition: Secondary | ICD-10-CM | POA: Diagnosis present

## 2022-05-23 DIAGNOSIS — R1010 Upper abdominal pain, unspecified: Secondary | ICD-10-CM

## 2022-05-23 DIAGNOSIS — J9811 Atelectasis: Secondary | ICD-10-CM | POA: Diagnosis not present

## 2022-05-23 DIAGNOSIS — E785 Hyperlipidemia, unspecified: Secondary | ICD-10-CM | POA: Diagnosis not present

## 2022-05-23 DIAGNOSIS — R1013 Epigastric pain: Secondary | ICD-10-CM | POA: Diagnosis not present

## 2022-05-23 DIAGNOSIS — E230 Hypopituitarism: Secondary | ICD-10-CM | POA: Diagnosis not present

## 2022-05-23 DIAGNOSIS — G8929 Other chronic pain: Secondary | ICD-10-CM | POA: Diagnosis present

## 2022-05-23 DIAGNOSIS — R946 Abnormal results of thyroid function studies: Secondary | ICD-10-CM | POA: Diagnosis present

## 2022-05-23 DIAGNOSIS — R1084 Generalized abdominal pain: Secondary | ICD-10-CM | POA: Diagnosis not present

## 2022-05-23 DIAGNOSIS — Z90710 Acquired absence of both cervix and uterus: Secondary | ICD-10-CM

## 2022-05-23 DIAGNOSIS — I890 Lymphedema, not elsewhere classified: Secondary | ICD-10-CM

## 2022-05-23 DIAGNOSIS — I7300 Raynaud's syndrome without gangrene: Secondary | ICD-10-CM | POA: Diagnosis present

## 2022-05-23 DIAGNOSIS — E875 Hyperkalemia: Secondary | ICD-10-CM | POA: Diagnosis not present

## 2022-05-23 DIAGNOSIS — K829 Disease of gallbladder, unspecified: Secondary | ICD-10-CM | POA: Diagnosis present

## 2022-05-23 DIAGNOSIS — R627 Adult failure to thrive: Secondary | ICD-10-CM | POA: Diagnosis not present

## 2022-05-23 DIAGNOSIS — K219 Gastro-esophageal reflux disease without esophagitis: Secondary | ICD-10-CM | POA: Diagnosis present

## 2022-05-23 DIAGNOSIS — J90 Pleural effusion, not elsewhere classified: Secondary | ICD-10-CM | POA: Diagnosis not present

## 2022-05-23 DIAGNOSIS — R635 Abnormal weight gain: Secondary | ICD-10-CM | POA: Diagnosis present

## 2022-05-23 DIAGNOSIS — Z888 Allergy status to other drugs, medicaments and biological substances status: Secondary | ICD-10-CM

## 2022-05-23 DIAGNOSIS — E118 Type 2 diabetes mellitus with unspecified complications: Secondary | ICD-10-CM | POA: Diagnosis not present

## 2022-05-23 DIAGNOSIS — J439 Emphysema, unspecified: Secondary | ICD-10-CM | POA: Diagnosis not present

## 2022-05-23 DIAGNOSIS — K828 Other specified diseases of gallbladder: Secondary | ICD-10-CM | POA: Diagnosis present

## 2022-05-23 DIAGNOSIS — K824 Cholesterolosis of gallbladder: Secondary | ICD-10-CM | POA: Diagnosis not present

## 2022-05-23 DIAGNOSIS — M069 Rheumatoid arthritis, unspecified: Secondary | ICD-10-CM | POA: Diagnosis not present

## 2022-05-23 DIAGNOSIS — R0600 Dyspnea, unspecified: Secondary | ICD-10-CM | POA: Diagnosis not present

## 2022-05-23 DIAGNOSIS — K838 Other specified diseases of biliary tract: Secondary | ICD-10-CM | POA: Diagnosis not present

## 2022-05-23 DIAGNOSIS — R609 Edema, unspecified: Secondary | ICD-10-CM | POA: Diagnosis not present

## 2022-05-23 DIAGNOSIS — E1165 Type 2 diabetes mellitus with hyperglycemia: Secondary | ICD-10-CM | POA: Diagnosis not present

## 2022-05-23 DIAGNOSIS — R091 Pleurisy: Secondary | ICD-10-CM | POA: Diagnosis not present

## 2022-05-23 DIAGNOSIS — Z6825 Body mass index (BMI) 25.0-25.9, adult: Secondary | ICD-10-CM | POA: Diagnosis not present

## 2022-05-23 DIAGNOSIS — E1159 Type 2 diabetes mellitus with other circulatory complications: Secondary | ICD-10-CM | POA: Diagnosis not present

## 2022-05-23 DIAGNOSIS — R1011 Right upper quadrant pain: Secondary | ICD-10-CM | POA: Diagnosis not present

## 2022-05-23 DIAGNOSIS — R7401 Elevation of levels of liver transaminase levels: Secondary | ICD-10-CM | POA: Diagnosis present

## 2022-05-23 DIAGNOSIS — R846 Abnormal cytological findings in specimens from respiratory organs and thorax: Secondary | ICD-10-CM | POA: Diagnosis present

## 2022-05-23 DIAGNOSIS — D75838 Other thrombocytosis: Secondary | ICD-10-CM | POA: Diagnosis present

## 2022-05-23 DIAGNOSIS — Z87891 Personal history of nicotine dependence: Secondary | ICD-10-CM

## 2022-05-23 DIAGNOSIS — Z825 Family history of asthma and other chronic lower respiratory diseases: Secondary | ICD-10-CM

## 2022-05-23 DIAGNOSIS — I110 Hypertensive heart disease with heart failure: Secondary | ICD-10-CM | POA: Diagnosis not present

## 2022-05-23 DIAGNOSIS — R945 Abnormal results of liver function studies: Secondary | ICD-10-CM | POA: Diagnosis not present

## 2022-05-23 DIAGNOSIS — J918 Pleural effusion in other conditions classified elsewhere: Secondary | ICD-10-CM | POA: Diagnosis not present

## 2022-05-23 DIAGNOSIS — I10 Essential (primary) hypertension: Secondary | ICD-10-CM | POA: Diagnosis not present

## 2022-05-23 DIAGNOSIS — I5033 Acute on chronic diastolic (congestive) heart failure: Secondary | ICD-10-CM | POA: Diagnosis present

## 2022-05-23 DIAGNOSIS — R601 Generalized edema: Secondary | ICD-10-CM | POA: Diagnosis not present

## 2022-05-23 DIAGNOSIS — J940 Chylous effusion: Principal | ICD-10-CM | POA: Diagnosis present

## 2022-05-23 DIAGNOSIS — E876 Hypokalemia: Secondary | ICD-10-CM | POA: Diagnosis present

## 2022-05-23 DIAGNOSIS — Z79899 Other long term (current) drug therapy: Secondary | ICD-10-CM

## 2022-05-23 DIAGNOSIS — K5900 Constipation, unspecified: Secondary | ICD-10-CM | POA: Diagnosis not present

## 2022-05-23 DIAGNOSIS — R590 Localized enlarged lymph nodes: Secondary | ICD-10-CM

## 2022-05-23 DIAGNOSIS — R109 Unspecified abdominal pain: Secondary | ICD-10-CM | POA: Diagnosis present

## 2022-05-23 DIAGNOSIS — Z833 Family history of diabetes mellitus: Secondary | ICD-10-CM

## 2022-05-23 DIAGNOSIS — E871 Hypo-osmolality and hyponatremia: Secondary | ICD-10-CM | POA: Diagnosis not present

## 2022-05-23 DIAGNOSIS — M79604 Pain in right leg: Secondary | ICD-10-CM | POA: Diagnosis not present

## 2022-05-23 DIAGNOSIS — Z885 Allergy status to narcotic agent status: Secondary | ICD-10-CM

## 2022-05-23 DIAGNOSIS — M79605 Pain in left leg: Secondary | ICD-10-CM | POA: Diagnosis present

## 2022-05-23 DIAGNOSIS — Z8249 Family history of ischemic heart disease and other diseases of the circulatory system: Secondary | ICD-10-CM

## 2022-05-23 DIAGNOSIS — R748 Abnormal levels of other serum enzymes: Secondary | ICD-10-CM | POA: Diagnosis not present

## 2022-05-23 DIAGNOSIS — G9389 Other specified disorders of brain: Secondary | ICD-10-CM | POA: Diagnosis not present

## 2022-05-23 LAB — CBC
HCT: 39.9 % (ref 36.0–46.0)
Hemoglobin: 13.1 g/dL (ref 12.0–15.0)
MCH: 32.2 pg (ref 26.0–34.0)
MCHC: 32.8 g/dL (ref 30.0–36.0)
MCV: 98 fL (ref 80.0–100.0)
Platelets: 555 10*3/uL — ABNORMAL HIGH (ref 150–400)
RBC: 4.07 MIL/uL (ref 3.87–5.11)
RDW: 14.7 % (ref 11.5–15.5)
WBC: 10 10*3/uL (ref 4.0–10.5)
nRBC: 0 % (ref 0.0–0.2)

## 2022-05-23 LAB — URINALYSIS, ROUTINE W REFLEX MICROSCOPIC
Bilirubin Urine: NEGATIVE
Glucose, UA: NEGATIVE mg/dL
Hgb urine dipstick: NEGATIVE
Ketones, ur: NEGATIVE mg/dL
Nitrite: NEGATIVE
Protein, ur: NEGATIVE mg/dL
Specific Gravity, Urine: 1.015 (ref 1.005–1.030)
pH: 5 (ref 5.0–8.0)

## 2022-05-23 LAB — COMPREHENSIVE METABOLIC PANEL
ALT: 428 U/L — ABNORMAL HIGH (ref 0–44)
AST: 268 U/L — ABNORMAL HIGH (ref 15–41)
Albumin: 3.2 g/dL — ABNORMAL LOW (ref 3.5–5.0)
Alkaline Phosphatase: 164 U/L — ABNORMAL HIGH (ref 38–126)
Anion gap: 9 (ref 5–15)
BUN: 10 mg/dL (ref 8–23)
CO2: 35 mmol/L — ABNORMAL HIGH (ref 22–32)
Calcium: 8.6 mg/dL — ABNORMAL LOW (ref 8.9–10.3)
Chloride: 95 mmol/L — ABNORMAL LOW (ref 98–111)
Creatinine, Ser: 0.96 mg/dL (ref 0.44–1.00)
GFR, Estimated: >60 mL/min (ref >60)
Glucose, Bld: 153 mg/dL — ABNORMAL HIGH (ref 70–99)
Potassium: 3.1 mmol/L — ABNORMAL LOW (ref 3.5–5.1)
Sodium: 139 mmol/L (ref 135–145)
Total Bilirubin: 0.6 mg/dL (ref 0.3–1.2)
Total Protein: 6.7 g/dL (ref 6.5–8.1)

## 2022-05-23 LAB — SODIUM, URINE, RANDOM: Sodium, Ur: <10 mmol/L

## 2022-05-23 LAB — PROTIME-INR
INR: 1 (ref 0.8–1.2)
Prothrombin Time: 12.7 seconds (ref 11.4–15.2)

## 2022-05-23 LAB — CREATININE, URINE, RANDOM: Creatinine, Urine: 238 mg/dL

## 2022-05-23 LAB — BLOOD GAS, VENOUS
Acid-Base Excess: 15.4 mmol/L — ABNORMAL HIGH (ref 0.0–2.0)
Bicarbonate: 41.2 mmol/L — ABNORMAL HIGH (ref 20.0–28.0)
O2 Saturation: 33.3 %
Patient temperature: 37
pCO2, Ven: 54 mmHg (ref 44–60)
pH, Ven: 7.49 — ABNORMAL HIGH (ref 7.25–7.43)
pO2, Ven: <31 mmHg — CL (ref 32–45)

## 2022-05-23 LAB — ACETAMINOPHEN LEVEL: Acetaminophen (Tylenol), Serum: <10 ug/mL — ABNORMAL LOW (ref 10–30)

## 2022-05-23 LAB — BRAIN NATRIURETIC PEPTIDE: B Natriuretic Peptide: 82.6 pg/mL (ref 0.0–100.0)

## 2022-05-23 LAB — LIPASE, BLOOD: Lipase: 26 U/L (ref 11–51)

## 2022-05-23 LAB — CBG MONITORING, ED: Glucose-Capillary: 106 mg/dL — ABNORMAL HIGH (ref 70–99)

## 2022-05-23 MED ORDER — ONDANSETRON HCL 4 MG/2ML IJ SOLN
4.0000 mg | Freq: Once | INTRAMUSCULAR | Status: AC
  Administered 2022-05-23: 4 mg via INTRAVENOUS
  Filled 2022-05-23: qty 2

## 2022-05-23 MED ORDER — OXYCODONE HCL 5 MG PO TABS
5.0000 mg | ORAL_TABLET | Freq: Once | ORAL | Status: AC
  Administered 2022-05-23: 5 mg via ORAL
  Filled 2022-05-23: qty 1

## 2022-05-23 MED ORDER — FENTANYL CITRATE PF 50 MCG/ML IJ SOSY
50.0000 ug | PREFILLED_SYRINGE | Freq: Once | INTRAMUSCULAR | Status: AC
  Administered 2022-05-23: 50 ug via INTRAVENOUS
  Filled 2022-05-23: qty 1

## 2022-05-23 MED ORDER — FUROSEMIDE 10 MG/ML IJ SOLN
80.0000 mg | Freq: Once | INTRAMUSCULAR | Status: AC
  Administered 2022-05-23: 80 mg via INTRAVENOUS
  Filled 2022-05-23: qty 8

## 2022-05-23 MED ORDER — INSULIN ASPART 100 UNIT/ML IJ SOLN
0.0000 [IU] | INTRAMUSCULAR | Status: DC
  Administered 2022-05-24: 2 [IU] via SUBCUTANEOUS
  Administered 2022-05-25: 1 [IU] via SUBCUTANEOUS
  Filled 2022-05-23: qty 0.09

## 2022-05-23 NOTE — Assessment & Plan Note (Signed)
Obtain hepatitis serologies could be in the setting of liver congestion. If does not improve can obtain ultrasound of the liver to further evaluate And GI consult Obtain acetaminophen level

## 2022-05-23 NOTE — ED Provider Triage Note (Signed)
Emergency Medicine Provider Triage Evaluation Note  Alyssa Rhodes , a 61 y.o. female  was evaluated in triage.  Pt complains of abdominal pain x 3 days, worse this morning. Localized on the right side. Nauseous and vomiting, with intermittent diarrhea.   Saw [facility] Dr Bobbye Morton who told her she needed her gallbladder taken out on 10/15, but said she had "too much fluid and would drown" if they put her under for surgery.   Recently changed from furosemide to torsemide, follows with [facility]. Says she has about 40 lbs of extra fluid right now  Review of Systems  Positive: Abd pain, N/V/D, lightheaded, DOE, peripheral edema Negative: Fever, chills  Physical Exam  BP (!) 153/87 (BP Location: Right Arm)   Pulse (!) 106   Temp 98.3 F (36.8 C) (Oral)   Resp 20   SpO2 99%  Gen:   Awake, no distress   Resp:  Normal effort  MSK:   Moves extremities without difficulty  Other:  Bilateral leg and hand swelling  Medical Decision Making  Medically screening exam initiated at 12:45 PM.  Appropriate orders placed.  BIRDIE FETTY was informed that the remainder of the evaluation will be completed by another provider, this initial triage assessment does not replace that evaluation, and the importance of remaining in the ED until their evaluation is complete.  Workup initiated   Kateri Plummer, PA-C 05/23/22 1247

## 2022-05-23 NOTE — Assessment & Plan Note (Signed)
Chronic stable not on blood pressure medications

## 2022-05-23 NOTE — Assessment & Plan Note (Signed)
Cause unclear we will check prealbumin Albumin somewhat diminished. Had this work-up in the past No evidence of CHF Seem to have responded to diuresis we will try again.

## 2022-05-23 NOTE — H&P (Signed)
[name] HQI:[id number] DOB: 1961-01-11 DOA: 05/23/2022     PCP: Jonna Clark, MD   Outpatient Specialists:   CARDS:  Dr. Wyline Mood now release her  Pul[facility]   General surgery at [facility], Tresa Endo, LPN  Patient arrived to ER on 05/23/22 at 1134 Referred by Attending Virgina Norfolk, DO    Patient coming from:    home Lives  With family    Chief Complaint:   Chief Complaint  Patient presents with   Abdominal Pain    HPI: Alyssa Rhodes is a 61 y.o. female with medical history significant of RA, anasarca, biliary diskenesia, DM2, HTN, HLD    Presented with right upper quadrant pain Presents with abdominal pain nausea vomiting and lower extremity swelling on the left arm as well has been ongoing for the past few months she has known history of rheumatoid arthritis for which she takes methotrexate Has been seen by nephrology and started on torsemide to see if that would help with fluid overload.  She does have chronic abdominal pain but it has gotten much worse today.  Also noted more of a right upper quadrant pain she is not able to tolerate anything by mouth for past few days.  It feels like the swelling is increasing.  No fevers or chills.   It seems the patient has been seen at [facility] for the same complaints of right upper quadrant discomfort was not able to be cleared for surgery secondary to hypoxia she was diagnosed with biliary dyskinesia.  Patient have had at least 50 pound weight gain over the past 5 months recently was on prednisone taper but now has completed it. General surgery did recommend laparoscopic cholecystectomy But patient seem to be too unstable for that Patient has been seen by cardiology in the past echogram showed no evidence of CHF   Regarding pertinent Chronic problems:     Hyperlipidemia - not on statins   Lipid Panel     Component Value Date/Time   CHOL 118 02/04/2022 0818   TRIG  69 02/04/2022 0818   HDL 38 (L) 02/04/2022 0818   CHOLHDL 3.1 02/04/2022 0818   CHOLHDL 4 12/29/2021 0924   VLDL 21.6 12/29/2021 0924   LDLCALC 66 02/04/2022 0818   LABVLDL 14 02/04/2022 0818     HTN not on meds does take torsemide for swelling       DM 2 -  Lab Results  Component Value Date   HGBA1C 7.5 (H) 03/18/2022   on PO meds only     History of rheumatoid arthritis on methotrexate but just recently been taken off  While in ER:   Noted to have evidence of fluid overload Potassium down to 3.1    CXR - Moderate right and small left pleural effusions with adjacent basilar consolidation, likely atelectasis.  CTabd/pelvis -punctate right nephrolithiasis but otherwise unremarkable persistent anasarca and moderate pleural effusions right adrenal nodule nodularity   Following Medications were ordered in ER: Medications  oxyCODONE (Oxy IR/ROXICODONE) immediate release tablet 5 mg (5 mg Oral Given 05/23/22 1257)  furosemide (LASIX) injection 80 mg (80 mg Intravenous Given 05/23/22 2115)  fentaNYL (SUBLIMAZE) injection 50 mcg (50 mcg Intravenous Given 05/23/22 2116)  ondansetron (ZOFRAN) injection 4 mg (4 mg Intravenous Given 05/23/22 2116)    ________________    ED Triage Vitals  Enc Vitals Group     BP 05/23/22 1206 (!) 153/87     Pulse Rate 05/23/22  1206 (!) 106     Resp 05/23/22 1206 20     Temp 05/23/22 1206 98.3 F (36.8 C)     Temp Source 05/23/22 1206 Oral     SpO2 05/23/22 1206 99 %     Weight --      Height --      Head Circumference --      Peak Flow --      Pain Score 05/23/22 1217 10     Pain Loc --      Pain Edu? --      Excl. in GC? --   TMAX(24)@     _________________________________________ Significant initial  Findings: Abnormal Labs Reviewed  COMPREHENSIVE METABOLIC PANEL - Abnormal; Notable for the following components:      Result Value   Potassium 3.1 (*)    Chloride 95 (*)    CO2 35 (*)    Glucose, Bld 153 (*)    Calcium 8.6 (*)     Albumin 3.2 (*)    AST 268 (*)    ALT 428 (*)    Alkaline Phosphatase 164 (*)    All other components within normal limits  CBC - Abnormal; Notable for the following components:   Platelets 555 (*)    All other components within normal limits  URINALYSIS, ROUTINE W REFLEX MICROSCOPIC - Abnormal; Notable for the following components:   APPearance CLOUDY (*)    Leukocytes,Ua MODERATE (*)    Bacteria, UA RARE (*)    All other components within normal limits      ECG: Ordered Personally reviewed and interpreted by me showing: HR : 90 Rhythm: Sinus rhythm Multiple ventricular premature complexes Borderline T abnormalities, diffuse leads Prolonged QT interval QTC 581     The recent clinical data is shown below. Vitals:   05/23/22 1206 05/23/22 1820 05/23/22 2045 05/23/22 2130  BP: (!) 153/87 (!) 148/92 (!) 163/91 (!) 140/84  Pulse: (!) 106 92 (!) 102 92  Resp: 20 18 17 17   Temp: 98.3 F (36.8 C) 98.2 F (36.8 C)    TempSrc: Oral Oral    SpO2: 99% 90% 97% 95%    WBC     Component Value Date/Time   WBC 10.0 05/23/2022 1312   LYMPHSABS 1.3 04/06/2022 1110   LYMPHSABS 1.7 09/23/2019 1002   MONOABS 1.8 (H) 04/06/2022 1110   EOSABS 0.1 04/06/2022 1110   EOSABS 0.2 09/23/2019 1002   BASOSABS 0.1 04/06/2022 1110   BASOSABS 0.1 09/23/2019 1002      UA   no evidence of UTI   yeast in urine   Urine analysis:    Component Value Date/Time   COLORURINE YELLOW 05/23/2022 2043   APPEARANCEUR CLOUDY (A) 05/23/2022 2043   LABSPEC 1.015 05/23/2022 2043   PHURINE 5.0 05/23/2022 2043   GLUCOSEU NEGATIVE 05/23/2022 2043   HGBUR NEGATIVE 05/23/2022 2043   BILIRUBINUR NEGATIVE 05/23/2022 2043   BILIRUBINUR negative 11/24/2016 1637   KETONESUR NEGATIVE 05/23/2022 2043   PROTEINUR NEGATIVE 05/23/2022 2043   UROBILINOGEN 0.2 11/24/2016 1637   NITRITE NEGATIVE 05/23/2022 2043   LEUKOCYTESUR MODERATE (A) 05/23/2022 2043    Results for orders placed or performed during the hospital  encounter of 05/25/10  Surgical pcr screen     Status: None   Collection Time: 05/21/10  9:35 AM  Result Value Ref Range Status   MRSA, PCR NEGATIVE NEGATIVE Final   Staphylococcus aureus  NEGATIVE Final    NEGATIVE  The Xpert SA Assay (FDA approved for NASAL specimens only), is one component of a comprehensive surveillance program.  It is not intended to diagnose infection nor to guide or monitor treatment.     _______________________________________________ Hospitalist was called for admission for   Anasarca  Nausea and vomiting, unspecified vomiting type    The following Work up has been ordered so far:  Orders Placed This Encounter  Procedures   CT ABDOMEN PELVIS WO CONTRAST   DG Chest 2 View   Lipase, blood   Comprehensive metabolic panel   CBC   Urinalysis, Routine w reflex microscopic   Brain natriuretic peptide   Protime-INR   Diet NPO time specified   Consult to hospitalist   ED EKG   EKG 12-Lead     OTHER Significant initial  Findings:  labs showing:    Recent Labs  Lab 05/23/22 1312  NA 139  K 3.1*  CO2 35*  GLUCOSE 153*  BUN 10  CREATININE 0.96  CALCIUM 8.6*    Cr  stable,    Lab Results  Component Value Date   CREATININE 0.96 05/23/2022   CREATININE 0.82 04/10/2022   CREATININE 0.97 04/07/2022    Recent Labs  Lab 05/23/22 1312  AST 268*  ALT 428*  ALKPHOS 164*  BILITOT 0.6  PROT 6.7  ALBUMIN 3.2*   Lab Results  Component Value Date   CALCIUM 8.6 (L) 05/23/2022   PHOS 3.6 04/07/2022     Plt: Lab Results  Component Value Date   PLT 555 (H) 05/23/2022       Recent Labs  Lab 05/23/22 1312  WBC 10.0  HGB 13.1  HCT 39.9  MCV 98.0  PLT 555*    HG/HCT  stable,      Component Value Date/Time   HGB 13.1 05/23/2022 1312   HGB 9.8 (L) 09/23/2019 1002   HCT 39.9 05/23/2022 1312   HCT 30.9 (L) 09/23/2019 1002   MCV 98.0 05/23/2022 1312   MCV 80 09/23/2019 1002     Recent Labs  Lab 05/23/22 1312  LIPASE  26     Cardiac Panel (last 3 results) No results for input(s): "CKTOTAL", "CKMB", "TROPONINI", "RELINDX" in the last 72 hours.  .car BNP (last 3 results) Recent Labs    02/16/22 1118 03/17/22 1000 05/23/22 1312  BNP 14.7 25.6 82.6      DM  labs:  HbA1C: Recent Labs    06/30/21 0819 12/29/21 0924 03/18/22 0528  HGBA1C 6.4 13.5 Repeated and verified X2.* 7.5*       CBG (last 3)  No results for input(s): "GLUCAP" in the last 72 hours.        Cultures: No results found for: "SDES", "SPECREQUEST", "CULT", "REPTSTATUS"   Radiological Exams on Admission: DG Chest 2 View  Result Date: 05/23/2022 CLINICAL DATA:  fluid retention, dyspnea on exertion EXAM: CHEST - 2 VIEW COMPARISON:  Radiograph 04/06/2022 FINDINGS: Unchanged cardiomediastinal silhouette. There are moderate right and small left pleural effusions with adjacent basilar consolidation. No evidence of pneumothorax. No acute osseous abnormality. IMPRESSION: Moderate right and small left pleural effusions with adjacent basilar consolidation, likely atelectasis. Electronically Signed   By: Caprice Renshaw M.D.   On: 05/23/2022 13:27   CT ABDOMEN PELVIS WO CONTRAST  Result Date: 05/23/2022 CLINICAL DATA:  Pain.  Fluid in arms and legs.  "Gallbladder flare". EXAM: CT ABDOMEN AND PELVIS WITHOUT CONTRAST TECHNIQUE: Multidetector CT imaging of the abdomen and pelvis was performed following the standard protocol without  IV contrast. RADIATION DOSE REDUCTION: This exam was performed according to the departmental dose-optimization program which includes automated exposure control, adjustment of the mA and/or kV according to patient size and/or use of iterative reconstruction technique. COMPARISON:  04/06/2022 FINDINGS: Lower chest: Centrilobular emphysema. Bibasilar atelectasis. Moderate, right larger than left pleural effusions are increased. Normal heart size. Hepatobiliary: Normal noncontrast appearance of the liver. The gallbladder  is borderline distended, without calcified stone or specific evidence of acute cholecystitis. No biliary duct dilatation. Pancreas: Normal, without mass or ductal dilatation. Spleen: Normal in size, without focal abnormality. Adrenals/Urinary Tract: Normal left adrenal gland. Right adrenal nodularity, including at up to 1.4 cm and -3 HU on 22/4, most consistent with an adenoma. Suspect punctate lower pole right renal collecting system stone on 72/6 and 66/7. No hydroureter or ureteric calculi. No bladder calculi. Stomach/Bowel: Normal stomach, without wall thickening. Normal colon, appendix, and terminal ileum. Normal small bowel. Vascular/Lymphatic: Aortic atherosclerosis. No abdominopelvic adenopathy. Reproductive: Hysterectomy.  No adnexal mass. Other: Trace free pelvic fluid, similar. No free intraperitoneal air. Diffuse anasarca Musculoskeletal: No acute osseous abnormality. IMPRESSION: 1. Probable punctate right nephrolithiasis. No other explanation for abdominal pain. 2. Low sensitivity exam secondary to lack of oral or IV contrast. 3. Increase in moderate bilateral pleural effusions with persistent diffuse anasarca and trace pelvic fluid. Findings again suggest fluid overload/third-spacing. 4. Aortic atherosclerosis (ICD10-I70.0) and emphysema (ICD10-J43.9). 5. Right adrenal nodularity favored to be secondary to an underlying adenoma . In the absence of clinically indicated signs/symptoms require(s) no independent follow-up. Electronically Signed   By: Jeronimo Greaves M.D.   On: 05/23/2022 13:25   _______________________________________________________________________________________________________ Latest  Blood pressure (!) 140/84, pulse 92, temperature 98.2 F (36.8 C), temperature source Oral, resp. rate 17, SpO2 95 %.   Vitals  labs and radiology finding personally reviewed  Review of Systems:    Pertinent positives include:  abdominal pain, nausea, vomiting  Constitutional:  No weight loss,  night sweats, Fevers, chills, fatigue, weight loss  HEENT:  No headaches, Difficulty swallowing,Tooth/dental problems,Sore throat,  No sneezing, itching, ear ache, nasal congestion, post nasal drip,  Cardio-vascular:  No chest pain, Orthopnea, PND, anasarca, dizziness, palpitations.no Bilateral lower extremity swelling  GI:  No heartburn, indigestion, , diarrhea, change in bowel habits, loss of appetite, melena, blood in stool, hematemesis Resp:  no shortness of breath at rest. No dyspnea on exertion, No excess mucus, no productive cough, No non-productive cough, No coughing up of blood.No change in color of mucus.No wheezing. Skin:  no rash or lesions. No jaundice GU:  no dysuria, change in color of urine, no urgency or frequency. No straining to urinate.  No flank pain.  Musculoskeletal:  No joint pain or no joint swelling. No decreased range of motion. No back pain.  Psych:  No change in mood or affect. No depression or anxiety. No memory loss.  Neuro: no localizing neurological complaints, no tingling, no weakness, no double vision, no gait abnormality, no slurred speech, no confusion  All systems reviewed and apart from HOPI all are negative _______________________________________________________________________________________________ Past Medical History:   Past Medical History:  Diagnosis Date   Diabetes mellitus without complication (HCC)    Hyperlipidemia    Hypertension    Lupus (HCC)    Thyroid nodule 03/2010   aspiration    Past Surgical History:  Procedure Laterality Date   ABDOMINAL HYSTERECTOMY     CESAREAN SECTION     ESOPHAGOGASTRODUODENOSCOPY (EGD) WITH PROPOFOL N/A 04/08/2022   Procedure: ESOPHAGOGASTRODUODENOSCOPY (EGD) WITH  PROPOFOL;  Surgeon: Vida Rigger, MD;  Location: [facility];  Service: Gastroenterology;  Laterality: N/A;   SHOULDER SURGERY  2011   LEFT    Social History:  Ambulatory   independently      reports that she has quit smoking.  Her smoking use included cigarettes. She has a 41.00 pack-year smoking history. She has never used smokeless tobacco. She reports that she does not currently use alcohol after a past usage of about 3.0 standard drinks of alcohol per week. She reports that she does not use drugs.     Family History:  Family History  Problem Relation Age of Onset   Asthma Mother        doe not know mother well, sister advised her   Diabetes Mother    Heart disease Father        heart attack   Hypertension Father    ______________________________________________________________________________________________ Allergies: Allergies  Allergen Reactions   Codeine Nausea And Vomiting and Nausea Only   Hydroxychloroquine Rash     Prior to Admission medications   Medication Sig Start Date End Date Taking? Authorizing Provider  Ascorbic Acid (VITAMIN C PO) Take 1 tablet by mouth daily.    [provider]  atorvastatin (LIPITOR) 40 MG tablet Take 1 tablet (40 mg total) by mouth daily. 12/30/21   Janeece Agee, NP  doxycycline (VIBRA-TABS) 100 MG tablet Take 100 mg by mouth 2 (two) times daily. 04/23/22   [provider]  folic acid (FOLVITE) 1 MG tablet Take 1 mg by mouth in the morning. 02/27/19   [provider]  furosemide (LASIX) 40 MG tablet Take 1 tablet (40 mg total) by mouth 2 (two) times daily. 04/11/22 05/11/22  Dorcas Carrow, MD  gabapentin (NEURONTIN) 300 MG capsule Take 1 capsule (300 mg total) by mouth daily. 03/29/22   Osvaldo Shipper, MD  metFORMIN (GLUCOPHAGE) 500 MG tablet Take 1 tablet (500 mg total) by mouth daily with breakfast. 04/11/22 04/11/23  Dorcas Carrow, MD  methotrexate 2.5 MG tablet Take 4 tablets (10 mg total) by mouth every Wednesday. Please do not resume until cleared to do so by the kidney doctor at follow-up 03/30/22   Osvaldo Shipper, MD  Multiple Vitamin (MULTIVITAMIN) tablet Take 1 tablet by mouth daily with breakfast.    [provider]   Omega-3 Fatty Acids (FISH OIL PO) Take 1 capsule by mouth in the morning and at bedtime.    [provider]  omeprazole (PRILOSEC) 40 MG capsule Take 40 mg by mouth daily before breakfast. 03/04/22   [provider]  ondansetron (ZOFRAN) 4 MG tablet Take 4 mg by mouth 3 (three) times daily as needed for nausea or vomiting. 03/12/22   [provider]  oxyCODONE (OXY IR/ROXICODONE) 5 MG immediate release tablet Take 1 tablet (5 mg total) by mouth every 4 (four) hours as needed for moderate pain. 04/11/22   Dorcas Carrow, MD  polyethylene glycol (MIRALAX / GLYCOLAX) 17 g packet Take 17 g by mouth daily. 03/29/22   Osvaldo Shipper, MD  potassium chloride (KLOR-CON) 20 MEQ packet Take 20 mEq by mouth daily. 04/11/22 05/11/22  Dorcas Carrow, MD    ___________________________________________________________________________________________________ Physical Exam:    05/23/2022    9:30 PM 05/23/2022    8:45 PM 05/23/2022    6:20 PM  Vitals with BMI  Systolic 140 163 161  Diastolic 84 91 92  Pulse 92 102 92     1. General:  in No  Acute distress  Chronically ill   -appearing 2. Psychological: Alert and  Oriented 3. Head/ENT:   Dry Mucous Membranes                          Head Non traumatic, neck supple                           Poor Dentition 4. SKIN: normal   Skin turgor,  Skin clean Dry and intact no rash 5. Heart: Regular rate and rhythm no  Murmur, no Rub or gallop 6. Lungs:   no wheezes or crackles  diminished at the bases 7. Abdomen: Soft, generalized -tender, distended   obese  bowel sounds present 8. Lower extremities: no clubbing, cyanosis, 2+ edema 9. Neurologically Grossly intact, moving all 4 extremities equally  10. MSK: Normal range of motion    Chart has been reviewed  ______________________________________________________________________________________________  Assessment/Plan 61 y.o. female with medical history significant of RA, anasarca,  biliary diskenesia, DM2, HTN, HLD   Admitted for   Anasarca    Nausea and vomiting, unspecified vomiting type      Present on Admission:  Anasarca  HTN (hypertension)  Abdominal pain  Rheumatoid arthritis (HCC)  Type 2 diabetes mellitus with complication, without long-term current use of insulin (HCC)  Hypokalemia  Transaminitis     Anasarca Cause unclear we will check prealbumin Albumin somewhat diminished. Had this work-up in the past No evidence of CHF Seem to have responded to diuresis we will try again.   HTN (hypertension) Chronic stable not on blood pressure medications  Abdominal pain Chronic recurrent could be secondary to biliary dyskinesia but patient not a candidate for cholecystectomy given underlying medical condition.  Supportive management  Rheumatoid arthritis (HCC) Hold methotrexate while being hospitalized  Type 2 diabetes mellitus with complication, without long-term current use of insulin (HCC) Order sliding scale hold p.o. medications  Hypokalemia We will replace and check magnesium level  Transaminitis Obtain hepatitis serologies could be in the setting of liver congestion. If does not improve can obtain ultrasound of the liver to further evaluate And GI consult Obtain acetaminophen level    Other plan as per orders.  DVT prophylaxis:  SCD    Code Status:    Code Status: Prior FULL CODE    as per patient   I had personally discussed CODE STATUS with patient     Family Communication:   Family   at  Bedside    Disposition Plan:        To home once workup is complete and patient is stable   Following barriers for discharge:                        Fluid status improve                                               Would benefit from PT/OT eval prior to DC  Ordered                    Nutrition    consulted                   Consults called: none  Admission status:  ED Disposition     ED Disposition  Admit  Condition  --    Comment  Hospital Area: [facility] [[id number]]  Level of Care: Telemetry [5]  Admit to tele based on following criteria: Other see comments  Comments: hypokalemia  May admit patient to [facility] or [facility] if equivalent level of care is available:: No  Covid Evaluation: Asymptomatic - no recent exposure (last 10 days) testing not required  Diagnosis: Anasarca [[id number]]  Admitting Physician: Therisa Doyne [[id number]]  Attending Physician: Therisa Doyne [[id number]]  Certification:: I certify this patient will need inpatient services for at least 2 midnights  Estimated Length of Stay: 2            inpatient     I Expect 2 midnight stay secondary to severity of patient's current illness need for inpatient interventions justified by the following:  hemodynamic instability despite optimal treatment    Severe lab/radiological/exam abnormalities including:    Elevated LFT and extensive comorbidities including:  Chronic pain  DM2  That are currently affecting medical management.   I expect  patient to be hospitalized for 2 midnights requiring inpatient medical care.  Patient is at high risk for adverse outcome (such as loss of life or disability) if not treated.  Indication for inpatient stay as follows:    severe pain requiring acute inpatient management,      Need for IV diuretics    Level of care     tele  For 12H       Anastazia Creek 05/24/2022, 12:18 AM    [facility]     after 2 AM please page floor coverage PA If 7AM-7PM, please contact the day team taking care of the patient using [email or web address]   Patient was evaluated in the context of the global COVID-19 pandemic, which necessitated consideration that the patient might be at risk for infection with the SARS-CoV-2 virus that causes COVID-19. Institutional protocols and algorithms that pertain to the evaluation of patients at risk for COVID-19 are in a state of rapid change based on  information released by regulatory bodies including the CDC and federal and state organizations. These policies and algorithms were followed during the patient's care.

## 2022-05-23 NOTE — Subjective & Objective (Signed)
Presents with abdominal pain nausea vomiting and lower extremity swelling on the left arm as well has been ongoing for the past few months she has known history of rheumatoid arthritis for which she takes methotrexate Has been seen by nephrology and started on torsemide to see if that would help with fluid overload.  She does have chronic abdominal pain but it has gotten much worse today.  Also noted more of a right upper quadrant pain she is not able to tolerate anything by mouth for past few days.  It feels like the swelling is increasing.  No fevers or chills.

## 2022-05-23 NOTE — Assessment & Plan Note (Signed)
We will replace and check magnesium level 

## 2022-05-23 NOTE — ED Triage Notes (Signed)
Pt reports generalized abd pain with n/v/d x2 days. Pt reports need cholecystomy  Pt has left arm and bilateral lower extremity swelling.

## 2022-05-23 NOTE — Assessment & Plan Note (Signed)
Chronic recurrent could be secondary to biliary dyskinesia but patient not a candidate for cholecystectomy given underlying medical condition.  Supportive management

## 2022-05-23 NOTE — Assessment & Plan Note (Signed)
Order sliding scale hold p.o. medications 

## 2022-05-23 NOTE — Assessment & Plan Note (Signed)
Hold methotrexate while being hospitalized

## 2022-05-23 NOTE — ED Notes (Signed)
Date and time results received: 05/23/22 2340 (use smartphrase ".now" to insert current time)  Test: VBG PO2 Critical Value: <31  Name of Provider Notified: A. Roel Cluck, MD  Orders Received? Or Actions Taken?:  n/a

## 2022-05-23 NOTE — ED Provider Notes (Signed)
[facility] DEPT Provider Note   CSN: [id number] Arrival date & time: 05/23/22  1134     History  Chief Complaint  Patient presents with   Abdominal Pain    Alyssa Rhodes is a 61 y.o. female.  Patient is here with ongoing abdominal pain, nausea, vomiting, swelling of her leg and left arm.  Alyssa Rhodes has been dealing with this issue for the last several months.  History of rheumatoid arthritis on methotrexate.  Alyssa Rhodes has had multiple evaluations by multiple specialties for her issues and overall not quite sure why Alyssa Rhodes has had this volume overload issue.  Alyssa Rhodes recently just started torsemide after seeing nephrology.  Alyssa Rhodes has been having some chronic abdominal pain that is acute on chronic today.  Patient has had increased right upper quadrant abdominal pain, nausea, vomiting.  Alyssa Rhodes has not been able to tolerate anything by mouth for the last several days.  Alyssa Rhodes feels like Alyssa Rhodes is getting more swelling.  Denies any fevers or chills.  Denies any sick contacts.  The history is provided by the patient.       Home Medications Prior to Admission medications   Medication Sig Start Date End Date Taking? Authorizing Provider  Ascorbic Acid (VITAMIN C PO) Take 1 tablet by mouth daily.    [provider]  atorvastatin (LIPITOR) 40 MG tablet Take 1 tablet (40 mg total) by mouth daily. 12/30/21   Maximiano Coss, NP  doxycycline (VIBRA-TABS) 100 MG tablet Take 100 mg by mouth 2 (two) times daily. 04/23/22   [provider]  folic acid (FOLVITE) 1 MG tablet Take 1 mg by mouth in the morning. 02/27/19   [provider]  furosemide (LASIX) 40 MG tablet Take 1 tablet (40 mg total) by mouth 2 (two) times daily. 04/11/22 05/11/22  Barb Merino, MD  gabapentin (NEURONTIN) 300 MG capsule Take 1 capsule (300 mg total) by mouth daily. 03/29/22   Bonnielee Haff, MD  metFORMIN (GLUCOPHAGE) 500 MG tablet Take 1 tablet (500 mg total) by mouth daily with breakfast. 04/11/22  04/11/23  Barb Merino, MD  methotrexate 2.5 MG tablet Take 4 tablets (10 mg total) by mouth every Wednesday. Please do not resume until cleared to do so by the kidney doctor at follow-up 03/30/22   Bonnielee Haff, MD  Multiple Vitamin (MULTIVITAMIN) tablet Take 1 tablet by mouth daily with breakfast.    [provider]  Omega-3 Fatty Acids (FISH OIL PO) Take 1 capsule by mouth in the morning and at bedtime.    [provider]  omeprazole (PRILOSEC) 40 MG capsule Take 40 mg by mouth daily before breakfast. 03/04/22   [provider]  ondansetron (ZOFRAN) 4 MG tablet Take 4 mg by mouth 3 (three) times daily as needed for nausea or vomiting. 03/12/22   [provider]  oxyCODONE (OXY IR/ROXICODONE) 5 MG immediate release tablet Take 1 tablet (5 mg total) by mouth every 4 (four) hours as needed for moderate pain. 04/11/22   Barb Merino, MD  polyethylene glycol (MIRALAX / GLYCOLAX) 17 g packet Take 17 g by mouth daily. 03/29/22   Bonnielee Haff, MD  potassium chloride (KLOR-CON) 20 MEQ packet Take 20 mEq by mouth daily. 04/11/22 05/11/22  Barb Merino, MD      Allergies    Codeine and Hydroxychloroquine    Review of Systems   Review of Systems  Physical Exam Updated Vital Signs BP (!) 163/91   Pulse (!) 102   Temp 98.2 F (  36.8 C) (Oral)   Resp 17   SpO2 97%  Physical Exam Vitals and nursing note reviewed.  Constitutional:      General: Alyssa Rhodes is not in acute distress.    Appearance: Alyssa Rhodes is well-developed. Alyssa Rhodes is not ill-appearing.  HENT:     Head: Normocephalic and atraumatic.     Mouth/Throat:     Mouth: Mucous membranes are moist.  Eyes:     Extraocular Movements: Extraocular movements intact.     Conjunctiva/sclera: Conjunctivae normal.     Pupils: Pupils are equal, round, and reactive to light.  Cardiovascular:     Rate and Rhythm: Normal rate and regular rhythm.     Heart sounds: Normal heart sounds. No murmur heard. Pulmonary:     Effort:  Pulmonary effort is normal. No respiratory distress.     Breath sounds: Normal breath sounds.  Abdominal:     General: Abdomen is flat.     Palpations: Abdomen is soft.     Tenderness: There is abdominal tenderness in the right upper quadrant and epigastric area.  Musculoskeletal:        General: No swelling.     Cervical back: Neck supple.  Skin:    General: Skin is warm and dry.     Capillary Refill: Capillary refill takes less than 2 seconds.     Comments: Diffuse anasarca to the left arm, bilateral legs, abdomen, swelling spares the right arm  Neurological:     Mental Status: Alyssa Rhodes is alert.  Psychiatric:        Mood and Affect: Mood normal.     ED Results / Procedures / Treatments   Labs (all labs ordered are listed, but only abnormal results are displayed) Labs Reviewed  COMPREHENSIVE METABOLIC PANEL - Abnormal; Notable for the following components:      Result Value   Potassium 3.1 (*)    Chloride 95 (*)    CO2 35 (*)    Glucose, Bld 153 (*)    Calcium 8.6 (*)    Albumin 3.2 (*)    AST 268 (*)    ALT 428 (*)    Alkaline Phosphatase 164 (*)    All other components within normal limits  CBC - Abnormal; Notable for the following components:   Platelets 555 (*)    All other components within normal limits  URINALYSIS, ROUTINE W REFLEX MICROSCOPIC - Abnormal; Notable for the following components:   APPearance CLOUDY (*)    Leukocytes,Ua MODERATE (*)    Bacteria, UA RARE (*)    All other components within normal limits  LIPASE, BLOOD  BRAIN NATRIURETIC PEPTIDE  PROTIME-INR    EKG None  Radiology DG Chest 2 View  Result Date: 05/23/2022 CLINICAL DATA:  fluid retention, dyspnea on exertion EXAM: CHEST - 2 VIEW COMPARISON:  Radiograph 04/06/2022 FINDINGS: Unchanged cardiomediastinal silhouette. There are moderate right and small left pleural effusions with adjacent basilar consolidation. No evidence of pneumothorax. No acute osseous abnormality. IMPRESSION:  Moderate right and small left pleural effusions with adjacent basilar consolidation, likely atelectasis. Electronically Signed   By: Maurine Simmering M.D.   On: 05/23/2022 13:27   CT ABDOMEN PELVIS WO CONTRAST  Result Date: 05/23/2022 CLINICAL DATA:  Pain.  Fluid in arms and legs.  "Gallbladder flare". EXAM: CT ABDOMEN AND PELVIS WITHOUT CONTRAST TECHNIQUE: Multidetector CT imaging of the abdomen and pelvis was performed following the standard protocol without IV contrast. RADIATION DOSE REDUCTION: This exam was performed according to the departmental dose-optimization program which  includes automated exposure control, adjustment of the mA and/or kV according to patient size and/or use of iterative reconstruction technique. COMPARISON:  04/06/2022 FINDINGS: Lower chest: Centrilobular emphysema. Bibasilar atelectasis. Moderate, right larger than left pleural effusions are increased. Normal heart size. Hepatobiliary: Normal noncontrast appearance of the liver. The gallbladder is borderline distended, without calcified stone or specific evidence of acute cholecystitis. No biliary duct dilatation. Pancreas: Normal, without mass or ductal dilatation. Spleen: Normal in size, without focal abnormality. Adrenals/Urinary Tract: Normal left adrenal gland. Right adrenal nodularity, including at up to 1.4 cm and -3 HU on 22/4, most consistent with an adenoma. Suspect punctate lower pole right renal collecting system stone on 72/6 and 66/7. No hydroureter or ureteric calculi. No bladder calculi. Stomach/Bowel: Normal stomach, without wall thickening. Normal colon, appendix, and terminal ileum. Normal small bowel. Vascular/Lymphatic: Aortic atherosclerosis. No abdominopelvic adenopathy. Reproductive: Hysterectomy.  No adnexal mass. Other: Trace free pelvic fluid, similar. No free intraperitoneal air. Diffuse anasarca Musculoskeletal: No acute osseous abnormality. IMPRESSION: 1. Probable punctate right nephrolithiasis. No other  explanation for abdominal pain. 2. Low sensitivity exam secondary to lack of oral or IV contrast. 3. Increase in moderate bilateral pleural effusions with persistent diffuse anasarca and trace pelvic fluid. Findings again suggest fluid overload/third-spacing. 4. Aortic atherosclerosis (ICD10-I70.0) and emphysema (ICD10-J43.9). 5. Right adrenal nodularity favored to be secondary to an underlying adenoma . In the absence of clinically indicated signs/symptoms require(s) no independent follow-up. Electronically Signed   By: Abigail Miyamoto M.D.   On: 05/23/2022 13:25    Procedures Procedures    Medications Ordered in ED Medications  oxyCODONE (Oxy IR/ROXICODONE) immediate release tablet 5 mg (5 mg Oral Given 05/23/22 1257)  furosemide (LASIX) injection 80 mg (80 mg Intravenous Given 05/23/22 2115)  fentaNYL (SUBLIMAZE) injection 50 mcg (50 mcg Intravenous Given 05/23/22 2116)  ondansetron (ZOFRAN) injection 4 mg (4 mg Intravenous Given 05/23/22 2116)    ED Course/ Medical Decision Making/ A&P                           Medical Decision Making Amount and/or Complexity of Data Reviewed Labs: ordered.  Risk Prescription drug management. Decision regarding hospitalization.   THELDA GAGAN is here with abdominal pain, nausea, vomiting, volume overload.  Complex medical history with history of rheumatoid arthritis on methotrexate.  Alyssa Rhodes has been struggling with volume overload here recently.  Alyssa Rhodes is maybe 40 pounds heavier than normal.  Alyssa Rhodes is now seeing [facility] who switched her recently to furosemide to torsemide.  Alyssa Rhodes continues to gain weight in her legs and left arm.  Alyssa Rhodes never had swelling of the right arm.  Alyssa Rhodes has had some chronic abdominal issues with gallbladder issues.  Alyssa Rhodes has been having nausea and vomiting and not able to keep anything down.  Denies any fevers or chills.  Alyssa Rhodes does not have a history of lupus despite her chart saying that.  Alyssa Rhodes has a history of hypertension and  diabetes.  Overall Alyssa Rhodes has diffuse anasarca.  Suspect that is the cause of her discomfort.  Alyssa Rhodes is already had blood work done and CT scan abdomen pelvis done prior to my evaluation.  Lab work per my review and interpretation shows no significant anemia or electrolyte abnormality or kidney injury.  Chest x-ray shows volume overload with enlarging pleural effusions.  Alyssa Rhodes has increased in her pleural effusions from prior scans.  Alyssa Rhodes is got diffuse anasarca.  There is no acute gallbladder or  surgical process in the abdomen.  Overall not quite sure why Alyssa Rhodes has so much volume overload.  Does not appear that Alyssa Rhodes has cirrhosis of the liver.  Does not appear that Alyssa Rhodes has a history of heart failure.  This could be some sort of autoimmune process.  However Alyssa Rhodes needs symptomatic support with pain medicine, nausea medicine and advancement of her diet.  Alyssa Rhodes would benefit from aggressive diuresis.  Will admit to medicine for further care.  This chart was dictated using voice recognition software.  Despite best efforts to proofread,  errors can occur which can change the documentation meaning.         Final Clinical Impression(s) / ED Diagnoses Final diagnoses:  Anasarca  Nausea and vomiting, unspecified vomiting type    Rx / DC Orders ED Discharge Orders     None         Lennice Sites, DO 05/23/22 2141

## 2022-05-24 ENCOUNTER — Encounter (HOSPITAL_COMMUNITY): Payer: Self-pay | Admitting: Internal Medicine

## 2022-05-24 ENCOUNTER — Inpatient Hospital Stay (HOSPITAL_COMMUNITY): Payer: BC Managed Care – PPO

## 2022-05-24 DIAGNOSIS — J90 Pleural effusion, not elsewhere classified: Secondary | ICD-10-CM | POA: Diagnosis not present

## 2022-05-24 DIAGNOSIS — R1010 Upper abdominal pain, unspecified: Secondary | ICD-10-CM | POA: Diagnosis not present

## 2022-05-24 DIAGNOSIS — R601 Generalized edema: Secondary | ICD-10-CM | POA: Diagnosis not present

## 2022-05-24 LAB — GLUCOSE, CAPILLARY
Glucose-Capillary: 82 mg/dL (ref 70–99)
Glucose-Capillary: 151 mg/dL — ABNORMAL HIGH (ref 70–99)

## 2022-05-24 LAB — LACTATE DEHYDROGENASE, PLEURAL OR PERITONEAL FLUID: LD, Fluid: 75 U/L — ABNORMAL HIGH (ref 3–23)

## 2022-05-24 LAB — COMPREHENSIVE METABOLIC PANEL
ALT: 271 U/L — ABNORMAL HIGH (ref 0–44)
AST: 136 U/L — ABNORMAL HIGH (ref 15–41)
Albumin: 2.7 g/dL — ABNORMAL LOW (ref 3.5–5.0)
Alkaline Phosphatase: 118 U/L (ref 38–126)
Anion gap: 10 (ref 5–15)
BUN: 11 mg/dL (ref 8–23)
CO2: 32 mmol/L (ref 22–32)
Calcium: 8.3 mg/dL — ABNORMAL LOW (ref 8.9–10.3)
Chloride: 99 mmol/L (ref 98–111)
Creatinine, Ser: 0.91 mg/dL (ref 0.44–1.00)
GFR, Estimated: >60 mL/min (ref >60)
Glucose, Bld: 105 mg/dL — ABNORMAL HIGH (ref 70–99)
Potassium: 3.5 mmol/L (ref 3.5–5.1)
Sodium: 141 mmol/L (ref 135–145)
Total Bilirubin: 0.7 mg/dL (ref 0.3–1.2)
Total Protein: 5.7 g/dL — ABNORMAL LOW (ref 6.5–8.1)

## 2022-05-24 LAB — PHOSPHORUS
Phosphorus: 3.6 mg/dL (ref 2.5–4.6)
Phosphorus: 3.7 mg/dL (ref 2.5–4.6)

## 2022-05-24 LAB — CBC
HCT: 44.2 % (ref 36.0–46.0)
Hemoglobin: 14.6 g/dL (ref 12.0–15.0)
MCH: 32.5 pg (ref 26.0–34.0)
MCHC: 33 g/dL (ref 30.0–36.0)
MCV: 98.4 fL (ref 80.0–100.0)
Platelets: 372 10*3/uL (ref 150–400)
RBC: 4.49 MIL/uL (ref 3.87–5.11)
RDW: 14.7 % (ref 11.5–15.5)
WBC: 7.3 10*3/uL (ref 4.0–10.5)
nRBC: 0 % (ref 0.0–0.2)

## 2022-05-24 LAB — CBG MONITORING, ED
Glucose-Capillary: 106 mg/dL — ABNORMAL HIGH (ref 70–99)
Glucose-Capillary: 81 mg/dL (ref 70–99)
Glucose-Capillary: 93 mg/dL (ref 70–99)

## 2022-05-24 LAB — BASIC METABOLIC PANEL
Anion gap: 13 (ref 5–15)
BUN: 11 mg/dL (ref 8–23)
CO2: 31 mmol/L (ref 22–32)
Calcium: 8.4 mg/dL — ABNORMAL LOW (ref 8.9–10.3)
Chloride: 97 mmol/L — ABNORMAL LOW (ref 98–111)
Creatinine, Ser: 0.83 mg/dL (ref 0.44–1.00)
GFR, Estimated: >60 mL/min (ref >60)
Glucose, Bld: 119 mg/dL — ABNORMAL HIGH (ref 70–99)
Potassium: 3.2 mmol/L — ABNORMAL LOW (ref 3.5–5.1)
Sodium: 141 mmol/L (ref 135–145)

## 2022-05-24 LAB — HEPATIC FUNCTION PANEL
ALT: 320 U/L — ABNORMAL HIGH (ref 0–44)
AST: 176 U/L — ABNORMAL HIGH (ref 15–41)
Albumin: 2.9 g/dL — ABNORMAL LOW (ref 3.5–5.0)
Alkaline Phosphatase: 125 U/L (ref 38–126)
Bilirubin, Direct: 0.3 mg/dL — ABNORMAL HIGH (ref 0.0–0.2)
Indirect Bilirubin: 0.5 mg/dL (ref 0.3–0.9)
Total Bilirubin: 0.8 mg/dL (ref 0.3–1.2)
Total Protein: 6 g/dL — ABNORMAL LOW (ref 6.5–8.1)

## 2022-05-24 LAB — BODY FLUID CELL COUNT WITH DIFFERENTIAL
Eos, Fluid: 1 %
Lymphs, Fluid: 69 %
Monocyte-Macrophage-Serous Fluid: 19 % — ABNORMAL LOW (ref 50–90)
Neutrophil Count, Fluid: 11 % (ref 0–25)
Total Nucleated Cell Count, Fluid: 253 cu mm (ref 0–1000)

## 2022-05-24 LAB — CK: Total CK: 43 U/L (ref 38–234)

## 2022-05-24 LAB — HEPATITIS PANEL, ACUTE
HCV Ab: NONREACTIVE
Hep A IgM: NONREACTIVE
Hep B C IgM: NONREACTIVE
Hepatitis B Surface Ag: NONREACTIVE

## 2022-05-24 LAB — MAGNESIUM
Magnesium: 1.5 mg/dL — ABNORMAL LOW (ref 1.7–2.4)
Magnesium: 1.7 mg/dL (ref 1.7–2.4)

## 2022-05-24 LAB — TROPONIN I (HIGH SENSITIVITY): Troponin I (High Sensitivity): 7 ng/L (ref <18)

## 2022-05-24 LAB — GLUCOSE, PLEURAL OR PERITONEAL FLUID: Glucose, Fluid: 140 mg/dL

## 2022-05-24 LAB — PREALBUMIN: Prealbumin: 17 mg/dL — ABNORMAL LOW (ref 18–38)

## 2022-05-24 LAB — PROTEIN, PLEURAL OR PERITONEAL FLUID: Total protein, fluid: <3 g/dL

## 2022-05-24 MED ORDER — SODIUM CHLORIDE 0.9 % IV SOLN: 250.0000 mL | INTRAVENOUS | Status: DC | PRN

## 2022-05-24 MED ORDER — POTASSIUM CHLORIDE CRYS ER 20 MEQ PO TBCR
40.0000 meq | EXTENDED_RELEASE_TABLET | Freq: Once | ORAL | Status: AC
  Administered 2022-05-24: 40 meq via ORAL
  Filled 2022-05-24: qty 2

## 2022-05-24 MED ORDER — LIDOCAINE HCL 1 % IJ SOLN
INTRAMUSCULAR | Status: AC
  Administered 2022-05-24: 10 mL
  Filled 2022-05-24: qty 20

## 2022-05-24 MED ORDER — MAGNESIUM SULFATE 2 GM/50ML IV SOLN
2.0000 g | Freq: Once | INTRAVENOUS | Status: AC
  Administered 2022-05-24: 2 g via INTRAVENOUS
  Filled 2022-05-24: qty 50

## 2022-05-24 MED ORDER — POTASSIUM CHLORIDE 10 MEQ/100ML IV SOLN
10.0000 meq | INTRAVENOUS | Status: AC
  Administered 2022-05-24 (×4): 10 meq via INTRAVENOUS
  Filled 2022-05-24 (×4): qty 100

## 2022-05-24 MED ORDER — PANTOPRAZOLE SODIUM 40 MG PO TBEC
40.0000 mg | DELAYED_RELEASE_TABLET | Freq: Every day | ORAL | Status: DC
  Administered 2022-05-24 – 2022-06-08 (×16): 40 mg via ORAL
  Filled 2022-05-24 (×17): qty 1

## 2022-05-24 MED ORDER — OXYCODONE HCL 5 MG PO TABS
5.0000 mg | ORAL_TABLET | ORAL | Status: DC | PRN
  Administered 2022-05-24 – 2022-05-25 (×6): 5 mg via ORAL
  Filled 2022-05-24 (×6): qty 1

## 2022-05-24 MED ORDER — SODIUM CHLORIDE 0.9% FLUSH: 3.0000 mL | INTRAVENOUS | Status: DC | PRN

## 2022-05-24 MED ORDER — FUROSEMIDE 10 MG/ML IJ SOLN
40.0000 mg | Freq: Two times a day (BID) | INTRAMUSCULAR | Status: DC
  Administered 2022-05-24 – 2022-05-25 (×3): 40 mg via INTRAVENOUS
  Filled 2022-05-24 (×3): qty 4

## 2022-05-24 MED ORDER — SODIUM CHLORIDE 0.9% FLUSH
3.0000 mL | Freq: Two times a day (BID) | INTRAVENOUS | Status: DC
  Administered 2022-05-24 – 2022-06-08 (×29): 3 mL via INTRAVENOUS

## 2022-05-24 MED ORDER — POLYETHYLENE GLYCOL 3350 17 G PO PACK
17.0000 g | PACK | Freq: Every day | ORAL | Status: DC
  Administered 2022-05-24 – 2022-05-26 (×3): 17 g via ORAL
  Filled 2022-05-24 (×7): qty 1

## 2022-05-24 MED ORDER — ALBUMIN HUMAN 25 % IV SOLN
25.0000 g | Freq: Four times a day (QID) | INTRAVENOUS | Status: DC
  Administered 2022-05-24: 12.5 g via INTRAVENOUS
  Administered 2022-05-24 – 2022-05-25 (×4): 25 g via INTRAVENOUS
  Filled 2022-05-24 (×6): qty 100

## 2022-05-24 NOTE — Hospital Course (Addendum)
61 y.o. female with PMHx RA recently taken off methotrexate, DM, HTN, HLD, biliary dyskinesia (dx'ed by [facility]) presenting with extensive approximate 50 pound weight gain of unclear etiology.  Attempts to diurese the patient in the outpatient setting with torsemide were unsuccessful.  Patient presented to [facility] emergency department with progressive weight gain and right upper quadrant pain as well as diffuse swelling.  The hospitalist group was then called to assess the patient for admission to the hospital.  Patient underwent extensive work-up to identify the cause of the patient's diffuse third spacing.  Urinalysis revealed no evidence of significant proteinuria.  Albumin was normal despite serial hepatic function panels.  Echocardiogram revealed a normal ejection fraction of 60 to 65%.  Considering patient's substantial abdominal pain patient was evaluated by surgery who felt that the patient's gallbladder was not the cause of her symptoms.   During this hospitalization patient was found to have persisting bilateral pleural effusions.  Patient underwent  right thoracentesis 10/31 by IR which yielded 1.3L of chylous appearing fluid. She again had 2nd right thoracentesis on 11/8 that again yielded 850cc of chylous appearing fluid. Cytology from first thora showed reactive mesothelial cells.  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    Pulmonology additionally assisted with managing this patient throughout the hospitalization and recommended Sandostatin subcutaneous every 8 hours for management.  Despite these extensive diagnostic efforts, the patient did clinically improve with substantial diuresis.  Patient was initially managed with intravenous Lasix and eventually transitioned back to oral torsemide in the final days of her hospitalization.  Patient's strength and mobility  dramatically improved, approaching baseline.    Case was once again discussed with Oncology (Dr. Lindi Adie) prior to discharge.  Considering the extensive work-up above, there is a recurrent concern for an underlying insidious malignancy.  Dr. Lindi Adie agreed to have the patient follow-up with him in clinic for further evaluation and potential work-up for an underlying malignant process.  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 Per last [facility] note from 11/7, discussion with rheumatology was had and they did not have any rheumatologic reason to explain her anasarca either. A 24h urine protein collection in progess

## 2022-05-24 NOTE — Assessment & Plan Note (Addendum)
Patient previously on methotrexate and was discontinued for planned outpatient cholecystectomy.

## 2022-05-24 NOTE — Assessment & Plan Note (Addendum)
Normotensive 

## 2022-05-24 NOTE — Procedures (Signed)
PROCEDURE SUMMARY:  Successful US guided right thoracentesis. Yielded 1.3 L of chylous fluid. Pt tolerated procedure well. No immediate complications.  Specimen sent for labs. CXR ordered; no post-procedure pneumothorax identified   EBL < 5 mL  Theresa Duty PA-C 05/24/2022 4:21 PM

## 2022-05-24 NOTE — Assessment & Plan Note (Addendum)
Likely related to diuresis. -Continue potassium supplementation as needed

## 2022-05-24 NOTE — ED Notes (Signed)
ED TO INPATIENT HANDOFF REPORT  ED Nurse Name and Phone #:   S Name/Age/Gender Alyssa Rhodes 61 y.o. female Room/Bed: WA15/WA15  Code Status   Code Status: Full Code  Home/SNF/Other Home Patient oriented to: self, place, time, and situation Is this baseline? Yes   Triage Complete: Triage complete  Chief Complaint Anasarca [R60.1]  Triage Note Pt reports generalized abd pain with n/v/d x2 days. Pt reports need cholecystomy  Pt has left arm and bilateral lower extremity swelling.    Allergies Allergies  Allergen Reactions   Codeine Nausea And Vomiting and Nausea Only   Hydroxychloroquine Rash    Level of Care/Admitting Diagnosis ED Disposition     ED Disposition  Admit   Condition  --   Comment  Hospital Area: [facility] [[id number]]  Level of Care: Telemetry [5]  Admit to tele based on following criteria: Other see comments  Comments: hypokalemia  May admit patient to [facility] if equivalent level of care is available:: No  Covid Evaluation: Asymptomatic - no recent exposure (last 10 days) testing not required  Diagnosis: Anasarca [[id number]]  Admitting Physician: Toy Baker [[id number]]  Attending Physician: Toy Baker [[id number]]  Certification:: I certify this patient will need inpatient services for at least 2 midnights  Estimated Length of Stay: 2          B Medical/Surgery History Past Medical History:  Diagnosis Date   Diabetes mellitus without complication ([place])    Hyperlipidemia    Hypertension    Lupus ([place])    Thyroid nodule 03/2010   aspiration   Past Surgical History:  Procedure Laterality Date   ABDOMINAL HYSTERECTOMY     CESAREAN SECTION     ESOPHAGOGASTRODUODENOSCOPY (EGD) WITH PROPOFOL N/A 04/08/2022   Procedure: ESOPHAGOGASTRODUODENOSCOPY (EGD) WITH PROPOFOL;  Surgeon: Clarene Essex, MD;  Location: [facility];  Service: Gastroenterology;  Laterality: N/A;   SHOULDER SURGERY  2011   LEFT      A IV Location/Drains/Wounds Patient Lines/Drains/Airways Status     Active Line/Drains/Airways     Name Placement date Placement time Site Days   Peripheral IV 05/23/22 20 G Right Antecubital 05/23/22  2113  Antecubital  1   External Urinary Catheter 05/23/22  2157  --  1            Intake/Output Last 24 hours  Intake/Output Summary (Last 24 hours) at 05/24/2022 1148 Last data filed at 05/24/2022 1127 Gross per 24 hour  Intake 72.26 ml  Output 900 ml  Net -827.74 ml    Labs/Imaging Results for orders placed or performed during the hospital encounter of 05/23/22 (from the past 48 hour(s))  Lipase, blood     Status: None   Collection Time: 05/23/22  1:12 PM  Result Value Ref Range   Lipase 26 11 - 51 U/L    Comment: Performed at [facility], [place] [address], [place], [place] [address]  Comprehensive metabolic panel     Status: Abnormal   Collection Time: 05/23/22  1:12 PM  Result Value Ref Range   Sodium 139 135 - 145 mmol/L   Potassium 3.1 (L) 3.5 - 5.1 mmol/L   Chloride 95 (L) 98 - 111 mmol/L   CO2 35 (H) 22 - 32 mmol/L   Glucose, Bld 153 (H) 70 - 99 mg/dL    Comment: Glucose reference range applies only to samples taken after fasting for at least 8 hours.   BUN 10 8 - 23 mg/dL  Creatinine, Ser 0.96 0.44 - 1.00 mg/dL   Calcium 8.6 (L) 8.9 - 10.3 mg/dL   Total Protein 6.7 6.5 - 8.1 g/dL   Albumin 3.2 (L) 3.5 - 5.0 g/dL   AST 268 (H) 15 - 41 U/L   ALT 428 (H) 0 - 44 U/L   Alkaline Phosphatase 164 (H) 38 - 126 U/L   Total Bilirubin 0.6 0.3 - 1.2 mg/dL   GFR, Estimated >60 >60 mL/min    Comment: (NOTE) Calculated using the CKD-EPI Creatinine Equation (2021)    Anion gap 9 5 - 15    Comment: Performed at [facility], [place] [address] [place], [place] [address]  CBC     Status: Abnormal   Collection Time: 05/23/22  1:12 PM  Result Value Ref Range   WBC 10.0 4.0 - 10.5 K/uL   RBC 4.07 3.87 - 5.11 MIL/uL   Hemoglobin  13.1 12.0 - 15.0 g/dL   HCT 39.9 36.0 - 46.0 %   MCV 98.0 80.0 - 100.0 fL   MCH 32.2 26.0 - 34.0 pg   MCHC 32.8 30.0 - 36.0 g/dL   RDW 14.7 11.5 - 15.5 %   Platelets 555 (H) 150 - 400 K/uL   nRBC 0.0 0.0 - 0.2 %    Comment: Performed at [facility], [place] [address], [place], [place] [address]  Brain natriuretic peptide     Status: None   Collection Time: 05/23/22  1:12 PM  Result Value Ref Range   B Natriuretic Peptide 82.6 0.0 - 100.0 pg/mL    Comment: Performed at [facility], [place] [address], [place], [place] [address]  Urinalysis, Routine w reflex microscopic     Status: Abnormal   Collection Time: 05/23/22  8:43 PM  Result Value Ref Range   Color, Urine YELLOW YELLOW   APPearance CLOUDY (A) CLEAR   Specific Gravity, Urine 1.015 1.005 - 1.030   pH 5.0 5.0 - 8.0   Glucose, UA NEGATIVE NEGATIVE mg/dL   Hgb urine dipstick NEGATIVE NEGATIVE   Bilirubin Urine NEGATIVE NEGATIVE   Ketones, ur NEGATIVE NEGATIVE mg/dL   Protein, ur NEGATIVE NEGATIVE mg/dL   Nitrite NEGATIVE NEGATIVE   Leukocytes,Ua MODERATE (A) NEGATIVE   RBC / HPF 21-50 0 - 5 RBC/hpf   WBC, UA 21-50 0 - 5 WBC/hpf   Bacteria, UA RARE (A) NONE SEEN   Squamous Epithelial / LPF 21-50 0 - 5   Mucus PRESENT    Budding Yeast PRESENT    Hyaline Casts, UA PRESENT     Comment: Performed at [facility], [place] [address], [place], [place] [address]  Protime-INR     Status: None   Collection Time: 05/23/22  9:17 PM  Result Value Ref Range   Prothrombin Time 12.7 11.4 - 15.2 seconds   INR 1.0 0.8 - 1.2    Comment: (NOTE) INR goal varies based on device and disease states. Performed at [facility], [place] [address] [place], [place] [address]   Creatinine, urine, random     Status: None   Collection Time: 05/23/22 10:15 PM  Result Value Ref Range   Creatinine, Urine 238 mg/dL    Comment: Performed at [facility], [place]  [address], [place], [place] [address]  Sodium, urine, random     Status: None   Collection Time: 05/23/22 10:15 PM  Result Value Ref Range   Sodium, Ur <10 mmol/L    Comment: Performed  at [facility], [place] [address] [place], [place] [address]  CK     Status: None   Collection Time: 05/23/22 11:26 PM  Result Value Ref Range   Total CK 43 38 - 234 U/L    Comment: HEMOLYSIS AT THIS LEVEL MAY AFFECT RESULT Performed at [place] [address] [place], [place] [address]   Hepatic function panel     Status: Abnormal   Collection Time: 05/23/22 11:26 PM  Result Value Ref Range   Total Protein 6.0 (L) 6.5 - 8.1 g/dL   Albumin 2.9 (L) 3.5 - 5.0 g/dL   AST 176 (H) 15 - 41 U/L    Comment: HEMOLYSIS AT THIS LEVEL MAY AFFECT RESULT   ALT 320 (H) 0 - 44 U/L    Comment: HEMOLYSIS AT THIS LEVEL MAY AFFECT RESULT   Alkaline Phosphatase 125 38 - 126 U/L   Total Bilirubin 0.8 0.3 - 1.2 mg/dL    Comment: HEMOLYSIS AT THIS LEVEL MAY AFFECT RESULT   Bilirubin, Direct 0.3 (H) 0.0 - 0.2 mg/dL   Indirect Bilirubin 0.5 0.3 - 0.9 mg/dL    Comment: Performed at [facility], [place] [address] [place], [place] [address]  Magnesium     Status: Abnormal   Collection Time: 05/23/22 11:26 PM  Result Value Ref Range   Magnesium 1.5 (L) 1.7 - 2.4 mg/dL    Comment: Performed at [facility], [place] [address], [place], [place] [address]  Phosphorus     Status: None   Collection Time: 05/23/22 11:26 PM  Result Value Ref Range   Phosphorus 3.6 2.5 - 4.6 mg/dL    Comment: HEMOLYSIS AT THIS LEVEL MAY AFFECT RESULT Performed at [place] [address] [place], [place] [address]   Troponin I (High Sensitivity)     Status: None   Collection Time: 05/23/22 11:26 PM  Result Value Ref Range   Troponin I (High Sensitivity) 7 <18 ng/L    Comment: (NOTE) Elevated high sensitivity troponin I (hsTnI) values and  significant  changes across serial measurements may suggest ACS but many other  chronic and acute conditions are known to elevate hsTnI results.  Refer to the "Links" section for chest pain algorithms and additional  guidance. Performed at [facility], [place] [address], [place], [place] [address]   Basic metabolic panel     Status: Abnormal   Collection Time: 05/23/22 11:26 PM  Result Value Ref Range   Sodium 141 135 - 145 mmol/L   Potassium 3.2 (L) 3.5 - 5.1 mmol/L    Comment: HEMOLYSIS AT THIS LEVEL MAY AFFECT RESULT   Chloride 97 (L) 98 - 111 mmol/L   CO2 31 22 - 32 mmol/L   Glucose, Bld 119 (H) 70 - 99 mg/dL    Comment: Glucose reference range applies only to samples taken after fasting for at least 8 hours.   BUN 11 8 - 23 mg/dL   Creatinine, Ser 0.83 0.44 - 1.00 mg/dL   Calcium 8.4 (L) 8.9 - 10.3 mg/dL   GFR, Estimated >60 >60 mL/min    Comment: (NOTE) Calculated using the CKD-EPI Creatinine Equation (2021)    Anion gap 13 5 - 15    Comment: Performed at [facility], [place] [address], [place], [place] [address]  Acetaminophen level     Status: Abnormal   Collection Time: 05/23/22 11:26 PM  Result Value Ref Range   Acetaminophen (Tylenol),  Serum <10 (L) 10 - 30 ug/mL    Comment: (NOTE) Therapeutic concentrations vary significantly. A range of 10-30 ug/mL  may be an effective concentration for many patients. However, some  are best treated at concentrations outside of this range. Acetaminophen concentrations >150 ug/mL at 4 hours after ingestion  and >50 ug/mL at 12 hours after ingestion are often associated with  toxic reactions.  Performed at [facility], [place] [address] [place], [place] [address]   Blood gas, venous     Status: Abnormal   Collection Time: 05/23/22 11:26 PM  Result Value Ref Range   pH, Ven 7.49 (H) 7.25 - 7.43   pCO2, Ven 54 44 - 60 mmHg   pO2, Ven <31 (LL) 32 - 45 mmHg    Comment: CRITICAL  RESULT CALLED TO, READ BACK BY AND VERIFIED WITH: BRATU,D EMTP AT 2339 ON 05/23/22 BY [id number]    Bicarbonate 41.2 (H) 20.0 - 28.0 mmol/L   Acid-Base Excess 15.4 (H) 0.0 - 2.0 mmol/L   O2 Saturation 33.3 %   Patient temperature 37.0     Comment: Performed at [facility], [place] [address], [place], [place] [address]  CBG monitoring, ED     Status: Abnormal   Collection Time: 05/23/22 11:56 PM  Result Value Ref Range   Glucose-Capillary 106 (H) 70 - 99 mg/dL    Comment: Glucose reference range applies only to samples taken after fasting for at least 8 hours.  CBG monitoring, ED     Status: Abnormal   Collection Time: 05/24/22  4:03 AM  Result Value Ref Range   Glucose-Capillary 106 (H) 70 - 99 mg/dL    Comment: Glucose reference range applies only to samples taken after fasting for at least 8 hours.  Hepatitis panel, acute     Status: None   Collection Time: 05/24/22  5:33 AM  Result Value Ref Range   Hepatitis B Surface Ag NON REACTIVE NON REACTIVE   HCV Ab NON REACTIVE NON REACTIVE    Comment: (NOTE) Nonreactive HCV antibody screen is consistent with no HCV infections,  unless recent infection is suspected or other evidence exists to indicate HCV infection.     Hep A IgM NON REACTIVE NON REACTIVE   Hep B C IgM NON REACTIVE NON REACTIVE    Comment: Performed at [facility], [place] [address], [place], [place] [address]  Comprehensive metabolic panel     Status: Abnormal   Collection Time: 05/24/22  5:33 AM  Result Value Ref Range   Sodium 141 135 - 145 mmol/L   Potassium 3.5 3.5 - 5.1 mmol/L   Chloride 99 98 - 111 mmol/L   CO2 32 22 - 32 mmol/L   Glucose, Bld 105 (H) 70 - 99 mg/dL    Comment: Glucose reference range applies only to samples taken after fasting for at least 8 hours.   BUN 11 8 - 23 mg/dL   Creatinine, Ser 0.91 0.44 - 1.00 mg/dL   Calcium 8.3 (L) 8.9 - 10.3 mg/dL   Total Protein 5.7 (L) 6.5 - 8.1 g/dL   Albumin 2.7 (L) 3.5 - 5.0 g/dL    AST 136 (H) 15 - 41 U/L   ALT 271 (H) 0 - 44 U/L   Alkaline Phosphatase 118 38 - 126 U/L   Total Bilirubin 0.7 0.3 - 1.2 mg/dL   GFR, Estimated >60 >60 mL/min    Comment: (NOTE) Calculated using the CKD-EPI Creatinine Equation (2021)  Anion gap 10 5 - 15    Comment: Performed at [facility], [place] [address] [place], [place] [address]  CBC     Status: None   Collection Time: 05/24/22  5:33 AM  Result Value Ref Range   WBC 7.3 4.0 - 10.5 K/uL   RBC 4.49 3.87 - 5.11 MIL/uL   Hemoglobin 14.6 12.0 - 15.0 g/dL   HCT 44.2 36.0 - 46.0 %   MCV 98.4 80.0 - 100.0 fL   MCH 32.5 26.0 - 34.0 pg   MCHC 33.0 30.0 - 36.0 g/dL   RDW 14.7 11.5 - 15.5 %   Platelets 372 150 - 400 K/uL   nRBC 0.0 0.0 - 0.2 %    Comment: Performed at [facility], [place] [address] [place], [place] [address]  Magnesium     Status: None   Collection Time: 05/24/22  5:33 AM  Result Value Ref Range   Magnesium 1.7 1.7 - 2.4 mg/dL    Comment: Performed at [facility], [place] [address], [place], [place] [address]  Phosphorus     Status: None   Collection Time: 05/24/22  5:33 AM  Result Value Ref Range   Phosphorus 3.7 2.5 - 4.6 mg/dL    Comment: Performed at [facility], [place] [address] [place], [place] [address]  Prealbumin     Status: Abnormal   Collection Time: 05/24/22  5:41 AM  Result Value Ref Range   Prealbumin 17 (L) 18 - 38 mg/dL    Comment: Performed at [place] [address], [place],  [address]  CBG monitoring, ED     Status: None   Collection Time: 05/24/22  7:53 AM  Result Value Ref Range   Glucose-Capillary 81 70 - 99 mg/dL    Comment: Glucose reference range applies only to samples taken after fasting for at least 8 hours.  CBG monitoring, ED     Status: None   Collection Time: 05/24/22 11:46 AM  Result Value Ref Range   Glucose-Capillary 93 70 - 99 mg/dL    Comment: Glucose reference range applies only  to samples taken after fasting for at least 8 hours.   US Abdomen Limited RUQ (LIVER/GB)  Result Date: 05/24/2022 CLINICAL DATA:  Transaminitis EXAM: ULTRASOUND ABDOMEN LIMITED RIGHT UPPER QUADRANT COMPARISON:  CT 05/23/2022 FINDINGS: Gallbladder: Non mobile nonshadowing echogenic focus along the gallbladder wall, likely 7 mm polyp. No wall thickening or sonographic Murphy sign. Common bile duct: Diameter: Mildly dilated, 9 mm.  No visible ductal stones. Liver: No focal lesion identified. Within normal limits in parenchymal echogenicity. Portal vein is patent on color Doppler imaging with normal direction of blood flow towards the liver. Other: Right pleural effusion noted. IMPRESSION: 7 mm gallbladder wall polyp. No visible stones or evidence of acute cholecystitis. Dilated common bile duct measuring up to 9 mm. No visible ductal stones. This could be further evaluated with MRCP if felt clinically indicated. Electronically Signed   By: Rolm Baptise M.D.   On: 05/24/2022 01:10   DG Chest 2 View  Result Date: 05/23/2022 CLINICAL DATA:  fluid retention, dyspnea on exertion EXAM: CHEST - 2 VIEW COMPARISON:  Radiograph 04/06/2022 FINDINGS: Unchanged cardiomediastinal silhouette. There are moderate right and small left pleural effusions with adjacent basilar consolidation. No evidence of pneumothorax. No acute osseous abnormality. IMPRESSION: Moderate right and small left pleural effusions with adjacent basilar consolidation, likely atelectasis. Electronically Signed   By: Maurine Simmering  M.D.   On: 05/23/2022 13:27   CT ABDOMEN PELVIS WO CONTRAST  Result Date: 05/23/2022 CLINICAL DATA:  Pain.  Fluid in arms and legs.  "Gallbladder flare". EXAM: CT ABDOMEN AND PELVIS WITHOUT CONTRAST TECHNIQUE: Multidetector CT imaging of the abdomen and pelvis was performed following the standard protocol without IV contrast. RADIATION DOSE REDUCTION: This exam was performed according to the departmental dose-optimization  program which includes automated exposure control, adjustment of the mA and/or kV according to patient size and/or use of iterative reconstruction technique. COMPARISON:  04/06/2022 FINDINGS: Lower chest: Centrilobular emphysema. Bibasilar atelectasis. Moderate, right larger than left pleural effusions are increased. Normal heart size. Hepatobiliary: Normal noncontrast appearance of the liver. The gallbladder is borderline distended, without calcified stone or specific evidence of acute cholecystitis. No biliary duct dilatation. Pancreas: Normal, without mass or ductal dilatation. Spleen: Normal in size, without focal abnormality. Adrenals/Urinary Tract: Normal left adrenal gland. Right adrenal nodularity, including at up to 1.4 cm and -3 HU on 22/4, most consistent with an adenoma. Suspect punctate lower pole right renal collecting system stone on 72/6 and 66/7. No hydroureter or ureteric calculi. No bladder calculi. Stomach/Bowel: Normal stomach, without wall thickening. Normal colon, appendix, and terminal ileum. Normal small bowel. Vascular/Lymphatic: Aortic atherosclerosis. No abdominopelvic adenopathy. Reproductive: Hysterectomy.  No adnexal mass. Other: Trace free pelvic fluid, similar. No free intraperitoneal air. Diffuse anasarca Musculoskeletal: No acute osseous abnormality. IMPRESSION: 1. Probable punctate right nephrolithiasis. No other explanation for abdominal pain. 2. Low sensitivity exam secondary to lack of oral or IV contrast. 3. Increase in moderate bilateral pleural effusions with persistent diffuse anasarca and trace pelvic fluid. Findings again suggest fluid overload/third-spacing. 4. Aortic atherosclerosis (ICD10-I70.0) and emphysema (ICD10-J43.9). 5. Right adrenal nodularity favored to be secondary to an underlying adenoma . In the absence of clinically indicated signs/symptoms require(s) no independent follow-up. Electronically Signed   By: Abigail Miyamoto M.D.   On: 05/23/2022 13:25     Pending Labs Unresulted Labs (From admission, onward)     Start     Ordered   05/24/22 0848  Urea nitrogen, urine  Add-on,   AD        05/24/22 0847   05/23/22 2340  Osmolality  Once,   R        05/23/22 2340   05/23/22 2210  TSH  Add-on,   AD        05/23/22 2209            Vitals/Pain Today's Vitals   05/24/22 0956 05/24/22 1127 05/24/22 1130 05/24/22 1145  BP:  125/69 125/69   Pulse:  94  94  Resp:  '16 14 14  '$ Temp:  97.7 F (36.5 C)    TempSrc:  Oral    SpO2:  97%  92%  PainSc: 6  8       Isolation Precautions No active isolations  Medications Medications  insulin aspart (novoLOG) injection 0-9 Units ( Subcutaneous Not Given 05/24/22 0803)  pantoprazole (PROTONIX) EC tablet 40 mg (40 mg Oral Given 05/24/22 0942)  oxyCODONE (Oxy IR/ROXICODONE) immediate release tablet 5 mg (5 mg Oral Given 05/24/22 0803)  polyethylene glycol (MIRALAX / GLYCOLAX) packet 17 g (17 g Oral Given 05/24/22 0940)  furosemide (LASIX) injection 40 mg (40 mg Intravenous Given 05/24/22 0804)  sodium chloride flush (NS) 0.9 % injection 3 mL (3 mLs Intravenous Given 05/24/22 0942)  sodium chloride flush (NS) 0.9 % injection 3 mL (has no administration in time range)  0.9 %  sodium  chloride infusion (has no administration in time range)  magnesium sulfate IVPB 2 g 50 mL (2 g Intravenous New Bag/Given 05/24/22 1132)  albumin human 25 % solution 25 g (0 g Intravenous Stopped 05/24/22 1127)  oxyCODONE (Oxy IR/ROXICODONE) immediate release tablet 5 mg (5 mg Oral Given 05/23/22 1257)  furosemide (LASIX) injection 80 mg (80 mg Intravenous Given 05/23/22 2115)  fentaNYL (SUBLIMAZE) injection 50 mcg (50 mcg Intravenous Given 05/23/22 2116)  ondansetron (ZOFRAN) injection 4 mg (4 mg Intravenous Given 05/23/22 2116)  potassium chloride 10 mEq in 100 mL IVPB (0 mEq Intravenous Stopped 05/24/22 0608)  magnesium sulfate IVPB 2 g 50 mL (0 g Intravenous Stopped 05/24/22 0248)  potassium chloride SA  (KLOR-CON M) CR tablet 40 mEq (40 mEq Oral Given 05/24/22 0941)    Mobility  Low fall risk   Focused Assessments    R Recommendations: See Admitting Provider Note  Report given to:   Additional Notes:

## 2022-05-24 NOTE — Assessment & Plan Note (Addendum)
Most recent hemoglobin A1C of 7.5%. Uncontrolled with hyperglycemia. -Continue SSI

## 2022-05-24 NOTE — Assessment & Plan Note (Addendum)
Chronic. Unclear etiology. Patient evaluated by cardiology with concern for non-cardiogenic etiology. Patient managed with Lasix IV, zaroxolyn and albumin with improvement of edema. 24 urine undetectable. Weight on admission of 67 kg (147.71 lbs) with weight slightly up today at 62 kg (136.69 lbs) today. Lasix IV transitioned to torsemide PO. -Continue torsemide -Daily weights and strict in/out

## 2022-05-24 NOTE — ED Notes (Signed)
Holding mag at this time, per pharm mag and albumin are not compatible.

## 2022-05-24 NOTE — Progress Notes (Signed)
Progress Note    ANAIYAH ANGLEMYER   LPF:[id number]  DOB: 11-15-1960  DOA: 05/23/2022     1 PCP: Doretha Sou, MD  Initial CC: abdominal pain; generalized edema  Hospital Course: Ms. Klett is a 61 yo female with PMH RA (previously on MTX, just taken off per rheumatology on 10/24), DMII, HLD, HTN who presented with recurrent right-sided abdominal pain. Of note, she says she does not have Lupus despite showing on her PMH.  This has been an ongoing complaint for several months.  She has gone recent work-up with surgery including multiple imaging studies and HIDA scan performed 04/11/2022.  EF 18% on HIDA scan with patent cystic duct and CBD.  She has also been having extensive generalized edema for several months.  She was evaluated outpatient by general surgery regarding possible cholecystectomy but due to her volume overload, was considered high risk for surgery until edema improved and etiology found. Echo was performed 03/15/2022 which showed normal EF, 60 to 65%, no RWMA, normal diastolic parameters.  Small pericardial effusion. She has undergone multiple duplex studies.  All negative for DVT.  She has also followed up with cardiology, recently seen on 04/27/2022.  She has been trialed with compression stockings with minimal effect due to discomfort and being too tight.  Lasix was transitioned to torsemide.  Amlodipine was discontinued altogether in case of contributing. Her edema is considered noncardiac in etiology.  She was recommended for referral to heme-onc to consider bone marrow biopsy.   She is admitted for her recurrent abdominal pain as noted and further work-up of possible regarding her ongoing edema.  Interval History:  Seen this morning in the ER.  We reviewed her work-up thus far over the past several months and current symptoms.  She does reiterate that eating triggers her right upper quadrant abdominal pain.  Denied any bloody bowel movements.  Edema has continued to  persist and she denies being on any home oxygen. She also confirms that her rheumatologist took her off methotrexate last week in efforts to allow her to undergo cholecystectomy.   Assessment and Plan: * Anasarca - Ongoing issue for several months now.  Has been evaluated by cardiology recently on 04/27/2022.  Edema is considered noncardiac in origin (negative BNP as well). See cardiology note as well -Of note, right upper extremity is spared and has no edema -Does have significant bilateral pleural effusions and small pericardial effusion. - for now will try and diurese as best as possible with IV lasix and albumin and monitor response to this -Of note, did discuss case with hematology (Dr. Julien Nordmann) and there was no pressing need for inpatient consultation; we reviewed prior thrombocytosis (over 1 million) and K/L ratio etc. For now, plan is to continue treatments as outlined, but if PLTC were to uptrend once again, can consider formal inpatient heme/onc evaluation for possible bone marrow biopsy or other input  Abdominal pain - Patient having ongoing intermittent abdominal pain since approximately May.  She endorses that food typically triggers her pain and therefore she does not eat much.  Denies any bloody bowel movements associated with eating.  No post prandial pain -She has previously undergone work-up with general surgery with possible consideration for cholecystectomy however her ongoing anasarca has been deterrent for surgery -HIDA scan performed 04/11/2022 noted with EF 18% but patent cystic duct and CBD.  Despite patent ducts, my concern would be for underlying biliary dyskinesia contributing to her pain.  Cholecystectomy hopefully to still be considered per  surgery.  I have asked for consultation during hospitalization for further input and evaluation -For now, continue monitoring abdominal pain and potential precipitating factors -Continue treating pain - Continue diet - Hold off on  fluids  Pleural effusion - Noted on repeat imaging and persists; right appears greater than left -Starting with thoracentesis, suspect right side will be drained.  Would also attempt to repeat thoracentesis to ultimately have both sides drained -Multiple studies ordered for testing including cytology.  Possible that this may shed some insight into her anasarca  Transaminitis - possibly from underlying gallbladder issues vs congestive hepatopathy despite no CHF; renal function normal as well, but has had prior AKI also - trend LFTs on diuresis   Rheumatoid arthritis ([place]) - Patient states she was formally taken off of methotrexate by her rheumatologist on 05/17/2022 in efforts to allow her to be able to undergo surgery for cholecystectomy  Hypokalemia - Replete as needed  Type 2 diabetes mellitus with complication, without long-term current use of insulin (HCC) - Continue SSI and CBG monitoring  HTN (hypertension) - BP elevated on admission and has slowly down trended - Continue Lasix for now - If remains elevated, can slowly resume further home meds - Avoid amlodipine given underlying anasarca which cardiology discontinued   Old records reviewed in assessment of this patient  Antimicrobials:   DVT prophylaxis:  SCDs Start: 05/24/22 0055   Code Status:   Code Status: Full Code  Mobility Assessment (last 72 hours)     Mobility Assessment     Row Name 05/24/22 04:50:30           Does patient have an order for bedrest or is patient medically unstable No - Continue assessment       What is the highest level of mobility based on the progressive mobility assessment? Level 5 (Walks with assist in room/hall) - Balance while stepping forward/back and can walk in room with assist - Complete                Barriers to discharge:  Disposition Plan: Home in 3 to 4 days Status is: Inpatient  Objective: Blood pressure 131/76, pulse 96, temperature 97.9 F (36.6 C),  temperature source Oral, resp. rate 16, SpO2 92 %.  Examination:  Physical Exam Constitutional:      Appearance: Normal appearance.  HENT:     Head: Normocephalic and atraumatic.     Mouth/Throat:     Mouth: Mucous membranes are moist.  Eyes:     Extraocular Movements: Extraocular movements intact.  Cardiovascular:     Rate and Rhythm: Normal rate and regular rhythm.  Pulmonary:     Comments: Crackles appreciated and decreased basilar breath sounds bilaterally Abdominal:     General: Bowel sounds are normal. There is no distension.     Palpations: Abdomen is soft.     Comments: Right upper quadrant tenderness to palpation  Musculoskeletal:        General: Swelling present.     Cervical back: Normal range of motion and neck supple.     Comments: Generalized tense edema throughout but spares right upper extremity  Skin:    General: Skin is warm and dry.  Neurological:     General: No focal deficit present.     Mental Status: She is alert.  Psychiatric:        Mood and Affect: Mood normal.      Consultants:  General surgery  Procedures:    Data Reviewed: Results for orders  placed or performed during the hospital encounter of 05/23/22 (from the past 24 hour(s))  Urinalysis, Routine w reflex microscopic     Status: Abnormal   Collection Time: 05/23/22  8:43 PM  Result Value Ref Range   Color, Urine YELLOW YELLOW   APPearance CLOUDY (A) CLEAR   Specific Gravity, Urine 1.015 1.005 - 1.030   pH 5.0 5.0 - 8.0   Glucose, UA NEGATIVE NEGATIVE mg/dL   Hgb urine dipstick NEGATIVE NEGATIVE   Bilirubin Urine NEGATIVE NEGATIVE   Ketones, ur NEGATIVE NEGATIVE mg/dL   Protein, ur NEGATIVE NEGATIVE mg/dL   Nitrite NEGATIVE NEGATIVE   Leukocytes,Ua MODERATE (A) NEGATIVE   RBC / HPF 21-50 0 - 5 RBC/hpf   WBC, UA 21-50 0 - 5 WBC/hpf   Bacteria, UA RARE (A) NONE SEEN   Squamous Epithelial / LPF 21-50 0 - 5   Mucus PRESENT    Budding Yeast PRESENT    Hyaline Casts, UA PRESENT    Protime-INR     Status: None   Collection Time: 05/23/22  9:17 PM  Result Value Ref Range   Prothrombin Time 12.7 11.4 - 15.2 seconds   INR 1.0 0.8 - 1.2  Creatinine, urine, random     Status: None   Collection Time: 05/23/22 10:15 PM  Result Value Ref Range   Creatinine, Urine 238 mg/dL  Sodium, urine, random     Status: None   Collection Time: 05/23/22 10:15 PM  Result Value Ref Range   Sodium, Ur <10 mmol/L  CK     Status: None   Collection Time: 05/23/22 11:26 PM  Result Value Ref Range   Total CK 43 38 - 234 U/L  Hepatic function panel     Status: Abnormal   Collection Time: 05/23/22 11:26 PM  Result Value Ref Range   Total Protein 6.0 (L) 6.5 - 8.1 g/dL   Albumin 2.9 (L) 3.5 - 5.0 g/dL   AST 176 (H) 15 - 41 U/L   ALT 320 (H) 0 - 44 U/L   Alkaline Phosphatase 125 38 - 126 U/L   Total Bilirubin 0.8 0.3 - 1.2 mg/dL   Bilirubin, Direct 0.3 (H) 0.0 - 0.2 mg/dL   Indirect Bilirubin 0.5 0.3 - 0.9 mg/dL  Magnesium     Status: Abnormal   Collection Time: 05/23/22 11:26 PM  Result Value Ref Range   Magnesium 1.5 (L) 1.7 - 2.4 mg/dL  Phosphorus     Status: None   Collection Time: 05/23/22 11:26 PM  Result Value Ref Range   Phosphorus 3.6 2.5 - 4.6 mg/dL  Troponin I (High Sensitivity)     Status: None   Collection Time: 05/23/22 11:26 PM  Result Value Ref Range   Troponin I (High Sensitivity) 7 <18 ng/L  Basic metabolic panel     Status: Abnormal   Collection Time: 05/23/22 11:26 PM  Result Value Ref Range   Sodium 141 135 - 145 mmol/L   Potassium 3.2 (L) 3.5 - 5.1 mmol/L   Chloride 97 (L) 98 - 111 mmol/L   CO2 31 22 - 32 mmol/L   Glucose, Bld 119 (H) 70 - 99 mg/dL   BUN 11 8 - 23 mg/dL   Creatinine, Ser 0.83 0.44 - 1.00 mg/dL   Calcium 8.4 (L) 8.9 - 10.3 mg/dL   GFR, Estimated >60 >60 mL/min   Anion gap 13 5 - 15  Acetaminophen level     Status: Abnormal   Collection Time: 05/23/22 11:26 PM  Result  Value Ref Range   Acetaminophen (Tylenol), Serum <10 (L) 10 - 30  ug/mL  Blood gas, venous     Status: Abnormal   Collection Time: 05/23/22 11:26 PM  Result Value Ref Range   pH, Ven 7.49 (H) 7.25 - 7.43   pCO2, Ven 54 44 - 60 mmHg   pO2, Ven <31 (LL) 32 - 45 mmHg   Bicarbonate 41.2 (H) 20.0 - 28.0 mmol/L   Acid-Base Excess 15.4 (H) 0.0 - 2.0 mmol/L   O2 Saturation 33.3 %   Patient temperature 37.0   CBG monitoring, ED     Status: Abnormal   Collection Time: 05/23/22 11:56 PM  Result Value Ref Range   Glucose-Capillary 106 (H) 70 - 99 mg/dL  CBG monitoring, ED     Status: Abnormal   Collection Time: 05/24/22  4:03 AM  Result Value Ref Range   Glucose-Capillary 106 (H) 70 - 99 mg/dL  Hepatitis panel, acute     Status: None   Collection Time: 05/24/22  5:33 AM  Result Value Ref Range   Hepatitis B Surface Ag NON REACTIVE NON REACTIVE   HCV Ab NON REACTIVE NON REACTIVE   Hep A IgM NON REACTIVE NON REACTIVE   Hep B C IgM NON REACTIVE NON REACTIVE  Comprehensive metabolic panel     Status: Abnormal   Collection Time: 05/24/22  5:33 AM  Result Value Ref Range   Sodium 141 135 - 145 mmol/L   Potassium 3.5 3.5 - 5.1 mmol/L   Chloride 99 98 - 111 mmol/L   CO2 32 22 - 32 mmol/L   Glucose, Bld 105 (H) 70 - 99 mg/dL   BUN 11 8 - 23 mg/dL   Creatinine, Ser 0.91 0.44 - 1.00 mg/dL   Calcium 8.3 (L) 8.9 - 10.3 mg/dL   Total Protein 5.7 (L) 6.5 - 8.1 g/dL   Albumin 2.7 (L) 3.5 - 5.0 g/dL   AST 136 (H) 15 - 41 U/L   ALT 271 (H) 0 - 44 U/L   Alkaline Phosphatase 118 38 - 126 U/L   Total Bilirubin 0.7 0.3 - 1.2 mg/dL   GFR, Estimated >60 >60 mL/min   Anion gap 10 5 - 15  CBC     Status: None   Collection Time: 05/24/22  5:33 AM  Result Value Ref Range   WBC 7.3 4.0 - 10.5 K/uL   RBC 4.49 3.87 - 5.11 MIL/uL   Hemoglobin 14.6 12.0 - 15.0 g/dL   HCT 44.2 36.0 - 46.0 %   MCV 98.4 80.0 - 100.0 fL   MCH 32.5 26.0 - 34.0 pg   MCHC 33.0 30.0 - 36.0 g/dL   RDW 14.7 11.5 - 15.5 %   Platelets 372 150 - 400 K/uL   nRBC 0.0 0.0 - 0.2 %  Magnesium      Status: None   Collection Time: 05/24/22  5:33 AM  Result Value Ref Range   Magnesium 1.7 1.7 - 2.4 mg/dL  Phosphorus     Status: None   Collection Time: 05/24/22  5:33 AM  Result Value Ref Range   Phosphorus 3.7 2.5 - 4.6 mg/dL  Prealbumin     Status: Abnormal   Collection Time: 05/24/22  5:41 AM  Result Value Ref Range   Prealbumin 17 (L) 18 - 38 mg/dL  CBG monitoring, ED     Status: None   Collection Time: 05/24/22  7:53 AM  Result Value Ref Range   Glucose-Capillary 81 70 - 99  mg/dL  CBG monitoring, ED     Status: None   Collection Time: 05/24/22 11:46 AM  Result Value Ref Range   Glucose-Capillary 93 70 - 99 mg/dL    I have Reviewed nursing notes, Vitals, and Lab results since pt's last encounter. Pertinent lab results : see above I have ordered test including BMP, CBC, Mg I have reviewed the last note from staff over past 24 hours I have discussed pt's care plan and test results with nursing staff, case manager   LOS: 1 day   Dwyane Dee, MD [facility] 05/24/2022, 1:45 PM

## 2022-05-24 NOTE — Assessment & Plan Note (Addendum)
Patient previously evaluated for consideration of elective cholecystectomy as an outpatient. General surgery consulted while inpatient. HIDA scan noted to be significant for reduced EF and patent cystic and common bile ducts. Patient without indication for inpatient cholecystectomy, so procedure deferred to outpatient. Abdominal pain appears to be improved from previous.

## 2022-05-24 NOTE — ED Notes (Signed)
Pt's O2 80% on RA while sleeping. Pt placed on 2lpm [place], O2 99%.

## 2022-05-24 NOTE — Assessment & Plan Note (Addendum)
Severely elevated AST/ALT up to 2,146/1,035. Presumed ischemic per GI discussion this admission. AST/ALT trending down. -Recheck hepatic function

## 2022-05-24 NOTE — Consult Note (Signed)
Alyssa Rhodes Feb 09, 1961  [id number].    Requesting MD: Dr. Dwyane Dee Chief Complaint/Reason for Consult: biliary dyskinesia   HPI:  This is a 61 yo black female with a history of RA, DM, HLD, Raynaud's, and HTN who has had an unintentional 50# weight gain in the last several months secondary to diffuse anasarca of unclear etiology.  She has been assured this is not secondary to her medications from her RA.  She is actually off of her MTX as well as steroids.  She has also been having abdominal pain for the last couple of months as well.  She was admitted several weeks ago and stated at that time her abdominal pain was upper abdomen and was not related to food or post-prandial.  She would have some N/V at times, but pain was worse then and would last for 5 hours or and so then dissipate.  Most of her pain would come in the mornings when she would wake up.  She underwent MRA of her abdomen, [place], CT, upper endo, and HIDA scan.  Besides significant anasarca no etiology of this was found and no clear etiology of her abdominal pain except a low gb ef of 18% but no reproduction by report in a previous noted of symptoms with CCK.  In the interim the patient has been seen by cardiology who felt this etiology was not cardiac in natures but recommended a heme/onc eval for possible bone marrow biopsy.  She has also been sent to a pulmonary office with no significant work up as of yet.  She does have significant SOB with talking, walking, or any physical activity and has known B pleural effusions.  She has also seen nephrology who placed her on torsemide to see if this would help with her anasarca.  It does not appear that this has had a significant impact overall.  Oddly enough, her anasarca spares her RUE only.  Today she presents to the ED as she began having upper abdomina, RUQ, and some what diffuse abdominal pain at 0400am Saturday morning.  She did have some nausea, but these symptoms resolved after  about 4 hours.  This happened again on Monday morning around the same time.  Despite the times, she does seem to think some of her symptoms are now food related, but isn't very clear on this given her symptoms woke her up from sleep.  Today her pain is improved and she was able to eat some breakfast here this morning.  Her repeat CT scan was negative and [place] was negative for stones or evidence of cholecystitis.  Her transaminases are mildly elevated.  Hepatitis panel is negative.  TB/alkphos normal.  WBC is normal as well.  Her albumin is 2.7 and prealbumin 17, but her anasarca predated her decreased nutritional state.  We have been asked to see her for possible lap chole for biliary dyskinesia.  ROS: ROS: see HPI  Family History  Problem Relation Age of Onset   Asthma Mother        doe not know mother well, sister advised her   Diabetes Mother    Heart disease Father        heart attack   Hypertension Father     Past Medical History:  Diagnosis Date   Diabetes mellitus without complication ([place])    Hyperlipidemia    Hypertension    Lupus ([place])    Thyroid nodule 03/2010   aspiration    Past Surgical History:  Procedure Laterality Date   ABDOMINAL HYSTERECTOMY     CESAREAN SECTION     ESOPHAGOGASTRODUODENOSCOPY (EGD) WITH PROPOFOL N/A 04/08/2022   Procedure: ESOPHAGOGASTRODUODENOSCOPY (EGD) WITH PROPOFOL;  Surgeon: Clarene Essex, MD;  Location: [facility];  Service: Gastroenterology;  Laterality: N/A;   SHOULDER SURGERY  2011   LEFT    Social History:  reports that she has quit smoking. Her smoking use included cigarettes. She has a 41.00 pack-year smoking history. She has never used smokeless tobacco. She reports that she does not currently use alcohol after a past usage of about 3.0 standard drinks of alcohol per week. She reports that she does not use drugs.  Allergies:  Allergies  Allergen Reactions   Codeine Nausea And Vomiting and Nausea Only   Hydroxychloroquine Rash     (Not in a hospital admission)    Physical Exam: Blood pressure 125/69, pulse 94, temperature 97.7 F (36.5 C), temperature source Oral, resp. rate 16, SpO2 97 %. General: pleasant, WD, black female who is laying in bed in NAD HEENT: head is normocephalic, atraumatic.  Sclera are noninjected.  PERRL.  Ears and nose without any masses or lesions.  Mouth is pink and moist Heart: regular, rate, and rhythm.  Normal s1,s2. No obvious murmurs, gallops, or rubs noted.  Palpable radial and pedal pulses bilaterally, but decrease secondary to edema Lungs: CTAB, no wheezes, rhonchi, or rales noted.  Respiratory effort nonlabored, but decrease BS at bases bilaterally.  On 2L [place], sats around 97% Abd: diffuse anasarca causing a firmness to her skin, mild diffuse tenderness greatest in upper abdomen throughout., distention secondary to anasarca, +BS, no masses, hernias, or organomegaly.  No peritonitis, guarding, rebound. MS: LUE and BLE with significant edema and anasarca.  RUE is spared and quite small.  Some evidence of raynauds cyanosis in left hand Psych: A&Ox3 with an appropriate affect.   Results for orders placed or performed during the hospital encounter of 05/23/22 (from the past 48 hour(s))  Lipase, blood     Status: None   Collection Time: 05/23/22  1:12 PM  Result Value Ref Range   Lipase 26 11 - 51 U/L    Comment: Performed at [facility], [place] [address], [place], [place] [address]  Comprehensive metabolic panel     Status: Abnormal   Collection Time: 05/23/22  1:12 PM  Result Value Ref Range   Sodium 139 135 - 145 mmol/L   Potassium 3.1 (L) 3.5 - 5.1 mmol/L   Chloride 95 (L) 98 - 111 mmol/L   CO2 35 (H) 22 - 32 mmol/L   Glucose, Bld 153 (H) 70 - 99 mg/dL    Comment: Glucose reference range applies only to samples taken after fasting for at least 8 hours.   BUN 10 8 - 23 mg/dL   Creatinine, Ser 0.96 0.44 - 1.00 mg/dL   Calcium 8.6 (L) 8.9 - 10.3 mg/dL   Total  Protein 6.7 6.5 - 8.1 g/dL   Albumin 3.2 (L) 3.5 - 5.0 g/dL   AST 268 (H) 15 - 41 U/L   ALT 428 (H) 0 - 44 U/L   Alkaline Phosphatase 164 (H) 38 - 126 U/L   Total Bilirubin 0.6 0.3 - 1.2 mg/dL   GFR, Estimated >60 >60 mL/min    Comment: (NOTE) Calculated using the CKD-EPI Creatinine Equation (2021)    Anion gap 9 5 - 15    Comment: Performed at [facility], [place] [address] [place], [place] [address]  CBC     Status: Abnormal   Collection Time: 05/23/22  1:12 PM  Result Value Ref Range   WBC 10.0 4.0 - 10.5 K/uL   RBC 4.07 3.87 - 5.11 MIL/uL   Hemoglobin 13.1 12.0 - 15.0 g/dL   HCT 39.9 36.0 - 46.0 %   MCV 98.0 80.0 - 100.0 fL   MCH 32.2 26.0 - 34.0 pg   MCHC 32.8 30.0 - 36.0 g/dL   RDW 14.7 11.5 - 15.5 %   Platelets 555 (H) 150 - 400 K/uL   nRBC 0.0 0.0 - 0.2 %    Comment: Performed at [facility], [place] [address] [place], [place] [address]  Brain natriuretic peptide     Status: None   Collection Time: 05/23/22  1:12 PM  Result Value Ref Range   B Natriuretic Peptide 82.6 0.0 - 100.0 pg/mL    Comment: Performed at [facility], [place] [address], [place], [place] [address]  Urinalysis, Routine w reflex microscopic     Status: Abnormal   Collection Time: 05/23/22  8:43 PM  Result Value Ref Range   Color, Urine YELLOW YELLOW   APPearance CLOUDY (A) CLEAR   Specific Gravity, Urine 1.015 1.005 - 1.030   pH 5.0 5.0 - 8.0   Glucose, UA NEGATIVE NEGATIVE mg/dL   Hgb urine dipstick NEGATIVE NEGATIVE   Bilirubin Urine NEGATIVE NEGATIVE   Ketones, ur NEGATIVE NEGATIVE mg/dL   Protein, ur NEGATIVE NEGATIVE mg/dL   Nitrite NEGATIVE NEGATIVE   Leukocytes,Ua MODERATE (A) NEGATIVE   RBC / HPF 21-50 0 - 5 RBC/hpf   WBC, UA 21-50 0 - 5 WBC/hpf   Bacteria, UA RARE (A) NONE SEEN   Squamous Epithelial / LPF 21-50 0 - 5   Mucus PRESENT    Budding Yeast PRESENT    Hyaline Casts, UA PRESENT     Comment: Performed at [facility], [place] [address] [place], [place] [address]  Protime-INR     Status: None   Collection Time: 05/23/22  9:17 PM  Result Value Ref Range   Prothrombin Time 12.7 11.4 - 15.2 seconds   INR 1.0 0.8 - 1.2    Comment: (NOTE) INR goal varies based on device and disease states. Performed at [facility], [place] [address], [place], [place] [address]   Creatinine, urine, random     Status: None   Collection Time: 05/23/22 10:15 PM  Result Value Ref Range   Creatinine, Urine 238 mg/dL    Comment: Performed at [facility], [place] [address] [place], [place] [address]  Sodium, urine, random     Status: None   Collection Time: 05/23/22 10:15 PM  Result Value Ref Range   Sodium, Ur <10 mmol/L    Comment: Performed at [facility], [place] [address], [place], [place] [address]  CK     Status: None   Collection Time: 05/23/22 11:26 PM  Result Value Ref Range   Total CK 43 38 - 234 U/L    Comment: HEMOLYSIS AT THIS LEVEL MAY AFFECT RESULT Performed at [place] [address] [place],  [address]   Hepatic function panel     Status: Abnormal   Collection Time: 05/23/22 11:26 PM  Result Value Ref Range   Total Protein 6.0 (L) 6.5 - 8.1 g/dL   Albumin 2.9 (L) 3.5 - 5.0 g/dL   AST 176 (H) 15 - 41 U/L  Comment: HEMOLYSIS AT THIS LEVEL MAY AFFECT RESULT   ALT 320 (H) 0 - 44 U/L    Comment: HEMOLYSIS AT THIS LEVEL MAY AFFECT RESULT   Alkaline Phosphatase 125 38 - 126 U/L   Total Bilirubin 0.8 0.3 - 1.2 mg/dL    Comment: HEMOLYSIS AT THIS LEVEL MAY AFFECT RESULT   Bilirubin, Direct 0.3 (H) 0.0 - 0.2 mg/dL   Indirect Bilirubin 0.5 0.3 - 0.9 mg/dL    Comment: Performed at [facility], [place] [address], [place], [place] [address]  Magnesium     Status: Abnormal   Collection Time: 05/23/22 11:26 PM  Result Value Ref Range   Magnesium 1.5 (L) 1.7 - 2.4 mg/dL    Comment: Performed  at [facility], [place] [address] [place], [place] [address]  Phosphorus     Status: None   Collection Time: 05/23/22 11:26 PM  Result Value Ref Range   Phosphorus 3.6 2.5 - 4.6 mg/dL    Comment: HEMOLYSIS AT THIS LEVEL MAY AFFECT RESULT Performed at [place] [address], [place], [place] [address]   Troponin I (High Sensitivity)     Status: None   Collection Time: 05/23/22 11:26 PM  Result Value Ref Range   Troponin I (High Sensitivity) 7 <18 ng/L    Comment: (NOTE) Elevated high sensitivity troponin I (hsTnI) values and significant  changes across serial measurements may suggest ACS but many other  chronic and acute conditions are known to elevate hsTnI results.  Refer to the "Links" section for chest pain algorithms and additional  guidance. Performed at [facility], [place] [address] [place], [place] [address]   Basic metabolic panel     Status: Abnormal   Collection Time: 05/23/22 11:26 PM  Result Value Ref Range   Sodium 141 135 - 145 mmol/L   Potassium 3.2 (L) 3.5 - 5.1 mmol/L    Comment: HEMOLYSIS AT THIS LEVEL MAY AFFECT RESULT   Chloride 97 (L) 98 - 111 mmol/L   CO2 31 22 - 32 mmol/L   Glucose, Bld 119 (H) 70 - 99 mg/dL    Comment: Glucose reference range applies only to samples taken after fasting for at least 8 hours.   BUN 11 8 - 23 mg/dL   Creatinine, Ser 0.83 0.44 - 1.00 mg/dL   Calcium 8.4 (L) 8.9 - 10.3 mg/dL   GFR, Estimated >60 >60 mL/min    Comment: (NOTE) Calculated using the CKD-EPI Creatinine Equation (2021)    Anion gap 13 5 - 15    Comment: Performed at [facility], [place] [address], [place], [place] [address]  Acetaminophen level     Status: Abnormal   Collection Time: 05/23/22 11:26 PM  Result Value Ref Range   Acetaminophen (Tylenol), Serum <10 (L) 10 - 30 ug/mL    Comment: (NOTE) Therapeutic concentrations vary significantly. A range of 10-30 ug/mL  may be an  effective concentration for many patients. However, some  are best treated at concentrations outside of this range. Acetaminophen concentrations >150 ug/mL at 4 hours after ingestion  and >50 ug/mL at 12 hours after ingestion are often associated with  toxic reactions.  Performed at [facility], [place] [address] [place], [place] [address]   Blood gas, venous     Status: Abnormal   Collection Time: 05/23/22 11:26 PM  Result Value Ref Range   pH, Ven 7.49 (H) 7.25 - 7.43   pCO2, Ven 54  44 - 60 mmHg   pO2, Ven <31 (LL) 32 - 45 mmHg    Comment: CRITICAL RESULT CALLED TO, READ BACK BY AND VERIFIED WITH: BRATU,D EMTP AT 2339 ON 05/23/22 BY [email or web address]    Bicarbonate 41.2 (H) 20.0 - 28.0 mmol/L   Acid-Base Excess 15.4 (H) 0.0 - 2.0 mmol/L   O2 Saturation 33.3 %   Patient temperature 37.0     Comment: Performed at [facility], [place] [address] [place], [place] [address]  CBG monitoring, ED     Status: Abnormal   Collection Time: 05/23/22 11:56 PM  Result Value Ref Range   Glucose-Capillary 106 (H) 70 - 99 mg/dL    Comment: Glucose reference range applies only to samples taken after fasting for at least 8 hours.  CBG monitoring, ED     Status: Abnormal   Collection Time: 05/24/22  4:03 AM  Result Value Ref Range   Glucose-Capillary 106 (H) 70 - 99 mg/dL    Comment: Glucose reference range applies only to samples taken after fasting for at least 8 hours.  Hepatitis panel, acute     Status: None   Collection Time: 05/24/22  5:33 AM  Result Value Ref Range   Hepatitis B Surface Ag NON REACTIVE NON REACTIVE   HCV Ab NON REACTIVE NON REACTIVE    Comment: (NOTE) Nonreactive HCV antibody screen is consistent with no HCV infections,  unless recent infection is suspected or other evidence exists to indicate HCV infection.     Hep A IgM NON REACTIVE NON REACTIVE   Hep B C IgM NON REACTIVE NON REACTIVE    Comment: Performed at [facility], [place] [address], [place], [place] [address]  Comprehensive metabolic panel     Status: Abnormal   Collection Time: 05/24/22  5:33 AM  Result Value Ref Range   Sodium 141 135 - 145 mmol/L   Potassium 3.5 3.5 - 5.1 mmol/L   Chloride 99 98 - 111 mmol/L   CO2 32 22 - 32 mmol/L   Glucose, Bld 105 (H) 70 - 99 mg/dL    Comment: Glucose reference range applies only to samples taken after fasting for at least 8 hours.   BUN 11 8 - 23 mg/dL   Creatinine, Ser 0.91 0.44 - 1.00 mg/dL   Calcium 8.3 (L) 8.9 - 10.3 mg/dL   Total Protein 5.7 (L) 6.5 - 8.1 g/dL   Albumin 2.7 (L) 3.5 - 5.0 g/dL   AST 136 (H) 15 - 41 U/L   ALT 271 (H) 0 - 44 U/L   Alkaline Phosphatase 118 38 - 126 U/L   Total Bilirubin 0.7 0.3 - 1.2 mg/dL   GFR, Estimated >60 >60 mL/min    Comment: (NOTE) Calculated using the CKD-EPI Creatinine Equation (2021)    Anion gap 10 5 - 15    Comment: Performed at [facility], [place] [address], [place], [place] [address]  CBC     Status: None   Collection Time: 05/24/22  5:33 AM  Result Value Ref Range   WBC 7.3 4.0 - 10.5 K/uL   RBC 4.49 3.87 - 5.11 MIL/uL   Hemoglobin 14.6 12.0 - 15.0 g/dL   HCT 44.2 36.0 - 46.0 %   MCV 98.4 80.0 - 100.0 fL   MCH 32.5 26.0 - 34.0 pg   MCHC 33.0 30.0 - 36.0 g/dL   RDW 14.7 11.5 - 15.5 %   Platelets 372 150 - 400 K/uL  nRBC 0.0 0.0 - 0.2 %    Comment: Performed at [facility] [place] [address] [place], [place] [address]  Magnesium     Status: None   Collection Time: 05/24/22  5:33 AM  Result Value Ref Range   Magnesium 1.7 1.7 - 2.4 mg/dL    Comment: Performed at [facility], [place] [address], [place], [place] [address]  Phosphorus     Status: None   Collection Time: 05/24/22  5:33 AM  Result Value Ref Range   Phosphorus 3.7 2.5 - 4.6 mg/dL    Comment: Performed at [facility], [place] [address] [place], [place] [address]  Prealbumin     Status: Abnormal   Collection Time: 05/24/22   5:41 AM  Result Value Ref Range   Prealbumin 17 (L) 18 - 38 mg/dL    Comment: Performed at [place] [address] [place], [place] [address]  CBG monitoring, ED     Status: None   Collection Time: 05/24/22  7:53 AM  Result Value Ref Range   Glucose-Capillary 81 70 - 99 mg/dL    Comment: Glucose reference range applies only to samples taken after fasting for at least 8 hours.   US Abdomen Limited RUQ (LIVER/GB)  Result Date: 05/24/2022 CLINICAL DATA:  Transaminitis EXAM: ULTRASOUND ABDOMEN LIMITED RIGHT UPPER QUADRANT COMPARISON:  CT 05/23/2022 FINDINGS: Gallbladder: Non mobile nonshadowing echogenic focus along the gallbladder wall, likely 7 mm polyp. No wall thickening or sonographic Murphy sign. Common bile duct: Diameter: Mildly dilated, 9 mm.  No visible ductal stones. Liver: No focal lesion identified. Within normal limits in parenchymal echogenicity. Portal vein is patent on color Doppler imaging with normal direction of blood flow towards the liver. Other: Right pleural effusion noted. IMPRESSION: 7 mm gallbladder wall polyp. No visible stones or evidence of acute cholecystitis. Dilated common bile duct measuring up to 9 mm. No visible ductal stones. This could be further evaluated with MRCP if felt clinically indicated. Electronically Signed   By: Rolm Baptise M.D.   On: 05/24/2022 01:10   DG Chest 2 View  Result Date: 05/23/2022 CLINICAL DATA:  fluid retention, dyspnea on exertion EXAM: CHEST - 2 VIEW COMPARISON:  Radiograph 04/06/2022 FINDINGS: Unchanged cardiomediastinal silhouette. There are moderate right and small left pleural effusions with adjacent basilar consolidation. No evidence of pneumothorax. No acute osseous abnormality. IMPRESSION: Moderate right and small left pleural effusions with adjacent basilar consolidation, likely atelectasis. Electronically Signed   By: Maurine Simmering M.D.   On: 05/23/2022 13:27   CT ABDOMEN PELVIS WO CONTRAST  Result Date:  05/23/2022 CLINICAL DATA:  Pain.  Fluid in arms and legs.  "Gallbladder flare". EXAM: CT ABDOMEN AND PELVIS WITHOUT CONTRAST TECHNIQUE: Multidetector CT imaging of the abdomen and pelvis was performed following the standard protocol without IV contrast. RADIATION DOSE REDUCTION: This exam was performed according to the departmental dose-optimization program which includes automated exposure control, adjustment of the mA and/or kV according to patient size and/or use of iterative reconstruction technique. COMPARISON:  04/06/2022 FINDINGS: Lower chest: Centrilobular emphysema. Bibasilar atelectasis. Moderate, right larger than left pleural effusions are increased. Normal heart size. Hepatobiliary: Normal noncontrast appearance of the liver. The gallbladder is borderline distended, without calcified stone or specific evidence of acute cholecystitis. No biliary duct dilatation. Pancreas: Normal, without mass or ductal dilatation. Spleen: Normal in size, without focal abnormality. Adrenals/Urinary Tract: Normal left adrenal gland. Right adrenal nodularity, including at up to 1.4 cm and -  3 HU on 22/4, most consistent with an adenoma. Suspect punctate lower pole right renal collecting system stone on 72/6 and 66/7. No hydroureter or ureteric calculi. No bladder calculi. Stomach/Bowel: Normal stomach, without wall thickening. Normal colon, appendix, and terminal ileum. Normal small bowel. Vascular/Lymphatic: Aortic atherosclerosis. No abdominopelvic adenopathy. Reproductive: Hysterectomy.  No adnexal mass. Other: Trace free pelvic fluid, similar. No free intraperitoneal air. Diffuse anasarca Musculoskeletal: No acute osseous abnormality. IMPRESSION: 1. Probable punctate right nephrolithiasis. No other explanation for abdominal pain. 2. Low sensitivity exam secondary to lack of oral or IV contrast. 3. Increase in moderate bilateral pleural effusions with persistent diffuse anasarca and trace pelvic fluid. Findings again  suggest fluid overload/third-spacing. 4. Aortic atherosclerosis (ICD10-I70.0) and emphysema (ICD10-J43.9). 5. Right adrenal nodularity favored to be secondary to an underlying adenoma . In the absence of clinically indicated signs/symptoms require(s) no independent follow-up. Electronically Signed   By: Abigail Miyamoto M.D.   On: 05/23/2022 13:25      Assessment/Plan Abdominal pain, biliary dyskinesia, diffuse anasarca of unclear etiology The patient has been seen and examined, chart, labs, vitals, and imaging personally reviewed.  The patient presents with diffuse abdominal pain but greatest in her upper abdomen and RUQ.  She has a normal [place] and CT scan.  She does have a HIDA that reveals an EF of 18% from last month.  However, by report she possibly had no reproduction of her symptoms with CCK.  It is possible the patient may be having some pain from this condition, but she also has this significant other problem with her anasarca of unclear etiology.  It is possible some of her abdominal symptoms could be coming from this as well as have nothing to do with her gallbladder as not everyone with low gb EFs are symptomatic.  With that being said, we are certainly concerned that we could be more hurtful than helpful right now by proceeding with an operation in the setting of SOB, unclear etiology of her anasarca.  We have recommended further evaluation with possible thoracentesis to evaluate her fluid to see if that gives any answers, but also a possible heme/onc eval as originally recommended by cardiology. At some point moving forward she may benefit from a lap chole, but we are not planning on that at this time.  We have discussed this with primary service.     FEN - carb mod diet VTE - ok for chemical prophylaxis from our standpoint ID - none currently needed  DM HTN HLD RA  Raynaud's  I reviewed hospitalist notes, last 24 h vitals and pain scores, last 48 h intake and output, last 24 h labs and  trends, and last 24 h imaging results.  Henreitta Cea, [facility] 05/24/2022, 11:40 AM Please see Amion for pager number during day hours 7:00am-4:30pm or 7:00am -11:30am on weekends

## 2022-05-24 NOTE — ED Notes (Signed)
Pt in bed, pt eating breakfast, pt reports decreased pain

## 2022-05-24 NOTE — Assessment & Plan Note (Deleted)
-   Noted on repeat imaging and persists; right appears greater than left -Starting with thoracentesis, suspect right side will be drained.  Would also attempt to repeat thoracentesis to ultimately have both sides drained -Multiple studies ordered for testing including cytology.  Possible that this may shed some insight into her anasarca

## 2022-05-24 NOTE — Evaluation (Signed)
Occupational Therapy Evaluation Patient Details Name: Alyssa Rhodes MRN: [id number] DOB: Apr 26, 1961 Today's Date: 05/24/2022   History of Present Illness Alyssa Rhodes is a 61 yo female admitted 05/23/22 with recurrent right-sided abdominal pain. She has also been having extensive generalized edema for several months. PMH includes: RA (previously on MTX, just taken off per rheumatology on 10/24), DMII, HLD, HTN   Clinical Impression   Pt is typically independent in ADL and mobility. ADLs take extended time and as edema has gotten worse she worries more and more about falling in the shower. Today she is min A for bed mobility, required min A for UB dressing, max A for LB dressing to don socks (attempted multiple compensatory strategies), min A for HHA SPT to chair. Set up for grooming tasks in the chair, and self-feeding when OT ended session. She was on 2L O2 throughout session and when removed did drop to 87% on RA. OT will continue to follow acutely. At this time recommend tub bench for safety and to be able to access shower with BLE edema. Also recommending HHOT post-acute for falls risk evaluation and for energy conservation strategies in her home environment. Next session plan on simulated tub transfers and energy conservation as well as LB dressing strategies      Recommendations for follow up therapy are one component of a multi-disciplinary discharge planning process, led by the attending physician.  Recommendations may be updated based on patient status, additional functional criteria and insurance authorization.   Follow Up Recommendations  Home health OT    Assistance Recommended at Discharge Intermittent Supervision/Assistance  Patient can return home with the following A little help with walking and/or transfers;A lot of help with bathing/dressing/bathroom;Assistance with cooking/housework;Assist for transportation;Help with stairs or ramp for entrance    Functional Status Assessment   Patient has had a recent decline in their functional status and demonstrates the ability to make significant improvements in function in a reasonable and predictable amount of time.  Equipment Recommendations  Tub/shower bench;BSC/3in1    Recommendations for Other Services PT consult     Precautions / Restrictions Precautions Precautions: Fall Restrictions Weight Bearing Restrictions: No      Mobility Bed Mobility Overal bed mobility: Needs Assistance Bed Mobility: Supine to Sit     Supine to sit: Min assist     General bed mobility comments: assist for hips to EOB with bed pad    Transfers Overall transfer level: Needs assistance Equipment used: 1 person hand held assist Transfers: Sit to/from Stand, Bed to chair/wheelchair/BSC Sit to Stand: Min assist, From elevated surface (ED stretcher)     Step pivot transfers: Min assist     General transfer comment: min A for balance      Balance Overall balance assessment: Needs assistance Sitting-balance support: Bilateral upper extremity supported Sitting balance-Leahy Scale: Fair     Standing balance support: Single extremity supported, Reliant on assistive device for balance Standing balance-Leahy Scale: Poor                             ADL either performed or assessed with clinical judgement   ADL Overall ADL's : Needs assistance/impaired Eating/Feeding: Modified independent   Grooming: Wash/dry hands;Set up;Sitting Grooming Details (indicate cue type and reason): in chair Upper Body Bathing: Set up;Sitting   Lower Body Bathing: Moderate assistance;Sitting/lateral leans Lower Body Bathing Details (indicate cue type and reason): knees down Upper Body Dressing : Minimal assistance;Sitting Upper  Body Dressing Details (indicate cue type and reason): donning extra gown Lower Body Dressing: Maximal assistance;Sitting/lateral leans Lower Body Dressing Details (indicate cue type and reason): unable to  access feet - attempted multiple compensatory strategies Toilet Transfer: Minimal assistance;Stand-pivot;BSC/3in1 Toilet Transfer Details (indicate cue type and reason): HHA         Functional mobility during ADLs: Minimal assistance;Cueing for safety (HHA, SPT only) General ADL Comments: decreased access to LB for ADL, decreased functionality in LUE due to edema     Vision Baseline Vision/History: 1 Wears glasses Ability to See in Adequate Light: 0 Adequate Patient Visual Report: No change from baseline       Perception     Praxis      Pertinent Vitals/Pain Pain Assessment Pain Assessment: Faces Faces Pain Scale: Hurts a little bit Pain Location: BLE, LUE, lower back, from edema Pain Descriptors / Indicators: Discomfort, Sore Pain Intervention(s): Monitored during session, Repositioned     Hand Dominance Right   Extremity/Trunk Assessment Upper Extremity Assessment Upper Extremity Assessment: LUE deficits/detail LUE Deficits / Details: edema present throughout limb grossly 3+/5 LUE Coordination: decreased fine motor;decreased gross motor   Lower Extremity Assessment Lower Extremity Assessment: Defer to PT evaluation (BLE edema and generalized weakness)       Communication Communication Communication: No difficulties   Cognition Arousal/Alertness: Awake/alert Behavior During Therapy: WFL for tasks assessed/performed Overall Cognitive Status: Within Functional Limits for tasks assessed                                       General Comments  Pt on 2L O2 throughout session    Exercises     Shoulder Instructions      Home Living Family/patient expects to be discharged to:: Private residence Living Arrangements: Children (son) Available Help at Discharge: Available PRN/intermittently Type of Home: House Home Access: Level entry     Home Layout: One level     Bathroom Shower/Tub: Corporate investment banker: Standard      Home Equipment: None   Additional Comments: son works 4 hrs/day      Prior Functioning/Environment Prior Level of Function : Independent/Modified 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 ADLs Comments: Takes a long time but is independent        OT Problem List: Decreased activity tolerance;Decreased knowledge of use of DME or AE;Impaired UE functional use;Pain;Increased edema      OT Treatment/Interventions: Self-care/ADL training;Energy conservation;DME and/or AE instruction;Therapeutic activities;Patient/family education;Balance training    OT Goals(Current goals can be found in the care plan section) Acute Rehab OT Goals Patient Stated Goal: be safe when I shower OT Goal Formulation: With patient Time For Goal Achievement: 06/07/22 Potential to Achieve Goals: Good ADL Goals Pt Will Perform Grooming: with modified independence;standing Pt Will Perform Upper Body Dressing: with modified independence;sitting Pt Will Perform Lower Body Dressing: with modified independence;with adaptive equipment;sit to/from stand Pt Will Transfer to Toilet: with modified independence;ambulating Pt Will Perform Toileting - Clothing Manipulation and hygiene: with modified independence;sit to/from stand Pt Will Perform Tub/Shower Transfer: Tub transfer;with supervision;tub bench;shower seat Additional ADL Goal #1: Pt will verbalize 3 energy conservation strategies to use duringh ADL routine at independent level  OT Frequency: Min 2X/week    Co-evaluation  AM-PAC OT "6 Clicks" Daily Activity     Outcome Measure Help from another person eating meals?: None Help from another person taking care of personal grooming?: A Little Help from another person toileting, which includes using toliet, bedpan, or urinal?: A Lot Help from another person bathing (including washing, rinsing, drying)?: A Lot Help from another person to put on and  taking off regular upper body clothing?: A Little Help from another person to put on and taking off regular lower body clothing?: A Lot 6 Click Score: 16   End of Session Equipment Utilized During Treatment: Gait belt;Oxygen (2L) Nurse Communication: Mobility status  Activity Tolerance: Patient tolerated treatment well Patient left: in chair;with call bell/phone within reach  OT Visit Diagnosis: Unsteadiness on feet (R26.81);Other abnormalities of gait and mobility (R26.89);Muscle weakness (generalized) (M62.81);Pain Pain - Right/Left: Left Pain - part of body: Arm;Leg (BLE)                Time: 1610-9604 OT Time Calculation (min): 25 min Charges:  OT General Charges $OT Visit: 1 Visit OT Evaluation $OT Eval Moderate Complexity: 1 Mod OT Treatments $Self Care/Home Management : 8-22 mins  Jesse Sans OTR/L Acute Rehabilitation Services Office: [place] 05/24/2022, 2:11 PM

## 2022-05-25 DIAGNOSIS — R601 Generalized edema: Secondary | ICD-10-CM | POA: Diagnosis not present

## 2022-05-25 DIAGNOSIS — E43 Unspecified severe protein-calorie malnutrition: Secondary | ICD-10-CM | POA: Insufficient documentation

## 2022-05-25 LAB — GLUCOSE, CAPILLARY
Glucose-Capillary: 110 mg/dL — ABNORMAL HIGH (ref 70–99)
Glucose-Capillary: 110 mg/dL — ABNORMAL HIGH (ref 70–99)
Glucose-Capillary: 139 mg/dL — ABNORMAL HIGH (ref 70–99)
Glucose-Capillary: 142 mg/dL — ABNORMAL HIGH (ref 70–99)
Glucose-Capillary: 142 mg/dL — ABNORMAL HIGH (ref 70–99)
Glucose-Capillary: 97 mg/dL (ref 70–99)

## 2022-05-25 LAB — COMPREHENSIVE METABOLIC PANEL
ALT: 99 U/L — ABNORMAL HIGH (ref 0–44)
AST: 44 U/L — ABNORMAL HIGH (ref 15–41)
Albumin: 3.8 g/dL (ref 3.5–5.0)
Alkaline Phosphatase: 62 U/L (ref 38–126)
Anion gap: 7 (ref 5–15)
BUN: 11 mg/dL (ref 8–23)
CO2: 33 mmol/L — ABNORMAL HIGH (ref 22–32)
Calcium: 8.6 mg/dL — ABNORMAL LOW (ref 8.9–10.3)
Chloride: 103 mmol/L (ref 98–111)
Creatinine, Ser: 0.79 mg/dL (ref 0.44–1.00)
GFR, Estimated: >60 mL/min (ref >60)
Glucose, Bld: 114 mg/dL — ABNORMAL HIGH (ref 70–99)
Potassium: 3.3 mmol/L — ABNORMAL LOW (ref 3.5–5.1)
Sodium: 143 mmol/L (ref 135–145)
Total Bilirubin: 0.7 mg/dL (ref 0.3–1.2)
Total Protein: 5.6 g/dL — ABNORMAL LOW (ref 6.5–8.1)

## 2022-05-25 LAB — CBC WITH DIFFERENTIAL/PLATELET
Abs Immature Granulocytes: 0.02 10*3/uL (ref 0.00–0.07)
Basophils Absolute: 0 10*3/uL (ref 0.0–0.1)
Basophils Relative: 0 %
Eosinophils Absolute: 0.2 10*3/uL (ref 0.0–0.5)
Eosinophils Relative: 3 %
HCT: 33.3 % — ABNORMAL LOW (ref 36.0–46.0)
Hemoglobin: 11.1 g/dL — ABNORMAL LOW (ref 12.0–15.0)
Immature Granulocytes: 0 %
Lymphocytes Relative: 13 %
Lymphs Abs: 0.9 10*3/uL (ref 0.7–4.0)
MCH: 32.7 pg (ref 26.0–34.0)
MCHC: 33.3 g/dL (ref 30.0–36.0)
MCV: 98.2 fL (ref 80.0–100.0)
Monocytes Absolute: 0.5 10*3/uL (ref 0.1–1.0)
Monocytes Relative: 7 %
Neutro Abs: 5.5 10*3/uL (ref 1.7–7.7)
Neutrophils Relative %: 77 %
Platelets: 450 10*3/uL — ABNORMAL HIGH (ref 150–400)
RBC: 3.39 MIL/uL — ABNORMAL LOW (ref 3.87–5.11)
RDW: 14.6 % (ref 11.5–15.5)
WBC: 7.3 10*3/uL (ref 4.0–10.5)
nRBC: 0 % (ref 0.0–0.2)

## 2022-05-25 LAB — MAGNESIUM: Magnesium: 2.1 mg/dL (ref 1.7–2.4)

## 2022-05-25 LAB — AMYLASE, PLEURAL OR PERITONEAL FLUID: Amylase, Fluid: 75 U/L

## 2022-05-25 MED ORDER — ENSURE MAX PROTEIN PO LIQD
11.0000 [oz_av] | Freq: Two times a day (BID) | ORAL | Status: DC
  Administered 2022-05-26 – 2022-05-30 (×5): 11 [oz_av] via ORAL
  Filled 2022-05-25 (×15): qty 330

## 2022-05-25 MED ORDER — ADULT MULTIVITAMIN W/MINERALS CH
1.0000 | ORAL_TABLET | Freq: Every day | ORAL | Status: DC
  Administered 2022-05-25 – 2022-06-08 (×15): 1 via ORAL
  Filled 2022-05-25 (×15): qty 1

## 2022-05-25 MED ORDER — METHOCARBAMOL 500 MG PO TABS
500.0000 mg | ORAL_TABLET | Freq: Three times a day (TID) | ORAL | Status: DC
  Administered 2022-05-25 – 2022-05-27 (×6): 500 mg via ORAL
  Filled 2022-05-25 (×6): qty 1

## 2022-05-25 MED ORDER — ONDANSETRON HCL 4 MG PO TABS: 4.0000 mg | ORAL_TABLET | Freq: Four times a day (QID) | ORAL | Status: DC | PRN

## 2022-05-25 MED ORDER — PROSOURCE PLUS PO LIQD
30.0000 mL | Freq: Three times a day (TID) | ORAL | Status: DC
  Administered 2022-05-25 – 2022-06-01 (×13): 30 mL via ORAL
  Filled 2022-05-25 (×16): qty 30

## 2022-05-25 MED ORDER — INSULIN ASPART 100 UNIT/ML IJ SOLN
0.0000 [IU] | Freq: Every day | INTRAMUSCULAR | Status: DC
  Administered 2022-06-01 – 2022-06-04 (×2): 2 [IU] via SUBCUTANEOUS

## 2022-05-25 MED ORDER — FUROSEMIDE 10 MG/ML IJ SOLN
80.0000 mg | Freq: Two times a day (BID) | INTRAMUSCULAR | Status: DC
  Administered 2022-05-25 – 2022-05-27 (×3): 80 mg via INTRAVENOUS
  Filled 2022-05-25 (×4): qty 8

## 2022-05-25 MED ORDER — POTASSIUM CHLORIDE CRYS ER 20 MEQ PO TBCR
40.0000 meq | EXTENDED_RELEASE_TABLET | ORAL | Status: AC
  Administered 2022-05-25 (×2): 40 meq via ORAL
  Filled 2022-05-25 (×2): qty 2

## 2022-05-25 MED ORDER — ACETAMINOPHEN 325 MG PO TABS
650.0000 mg | ORAL_TABLET | Freq: Four times a day (QID) | ORAL | Status: DC | PRN
  Administered 2022-05-25: 650 mg via ORAL
  Filled 2022-05-25: qty 2

## 2022-05-25 MED ORDER — INSULIN ASPART 100 UNIT/ML IJ SOLN
0.0000 [IU] | Freq: Three times a day (TID) | INTRAMUSCULAR | Status: DC
  Administered 2022-05-26 – 2022-05-27 (×3): 1 [IU] via SUBCUTANEOUS
  Administered 2022-05-27: 2 [IU] via SUBCUTANEOUS
  Administered 2022-05-27 – 2022-05-28 (×2): 1 [IU] via SUBCUTANEOUS
  Administered 2022-05-28: 2 [IU] via SUBCUTANEOUS
  Administered 2022-05-29: 1 [IU] via SUBCUTANEOUS
  Administered 2022-05-29: 5 [IU] via SUBCUTANEOUS
  Administered 2022-05-29 – 2022-05-30 (×3): 2 [IU] via SUBCUTANEOUS
  Administered 2022-05-30: 1 [IU] via SUBCUTANEOUS
  Administered 2022-05-31: 2 [IU] via SUBCUTANEOUS
  Administered 2022-05-31: 1 [IU] via SUBCUTANEOUS
  Administered 2022-05-31 – 2022-06-01 (×3): 2 [IU] via SUBCUTANEOUS
  Administered 2022-06-02: 5 [IU] via SUBCUTANEOUS
  Administered 2022-06-02: 2 [IU] via SUBCUTANEOUS
  Administered 2022-06-02: 1 [IU] via SUBCUTANEOUS
  Administered 2022-06-03 (×2): 2 [IU] via SUBCUTANEOUS
  Administered 2022-06-04: 3 [IU] via SUBCUTANEOUS
  Administered 2022-06-04: 2 [IU] via SUBCUTANEOUS
  Administered 2022-06-04 – 2022-06-05 (×2): 3 [IU] via SUBCUTANEOUS
  Administered 2022-06-05: 2 [IU] via SUBCUTANEOUS
  Administered 2022-06-06 (×2): 1 [IU] via SUBCUTANEOUS
  Administered 2022-06-07: 2 [IU] via SUBCUTANEOUS
  Administered 2022-06-08: 1 [IU] via SUBCUTANEOUS

## 2022-05-25 MED ORDER — OXYCODONE HCL 5 MG PO TABS
5.0000 mg | ORAL_TABLET | ORAL | Status: DC | PRN
  Administered 2022-05-25 – 2022-06-04 (×17): 5 mg via ORAL
  Filled 2022-05-25 (×18): qty 1

## 2022-05-25 MED ORDER — ATORVASTATIN CALCIUM 40 MG PO TABS
40.0000 mg | ORAL_TABLET | Freq: Every day | ORAL | Status: DC
  Administered 2022-05-25 – 2022-05-28 (×4): 40 mg via ORAL
  Filled 2022-05-25 (×4): qty 1

## 2022-05-25 MED ORDER — ACETAMINOPHEN 650 MG RE SUPP: 650.0000 mg | Freq: Four times a day (QID) | RECTAL | Status: DC | PRN

## 2022-05-25 MED ORDER — GABAPENTIN 300 MG PO CAPS
300.0000 mg | ORAL_CAPSULE | Freq: Every day | ORAL | Status: DC
  Administered 2022-05-25 – 2022-05-27 (×3): 300 mg via ORAL
  Filled 2022-05-25 (×3): qty 1

## 2022-05-25 MED ORDER — ONDANSETRON HCL 4 MG/2ML IJ SOLN: 4.0000 mg | Freq: Four times a day (QID) | INTRAMUSCULAR | Status: DC | PRN

## 2022-05-25 NOTE — Progress Notes (Signed)
Chaplain engaged in an initial visit with Alyssa Rhodes.  Chaplain offered education on the Advanced Directive, Healthcare POA.  [name] would like to appoint her youngest son as her healthcare agent.  He should be up later today to help her complete the paperwork and have a meaningful conversation around her needs and desires.  Chaplain will follow-up.    05/25/22 1100  Clinical Encounter Type  Visited With Patient  Visit Type Initial;Social support  Spiritual Encounters  Spiritual Needs Brochure;Literature

## 2022-05-25 NOTE — Progress Notes (Signed)
Progress Note: General Surgery Service   Chief Complaint/Subjective: Doing somewhat better today.  Continued abdominal pain, all over similar to chronic abdominal pain issues.  Objective: Vital signs in last 24 hours: Temp:  [97.7 F (36.5 C)-98.4 F (36.9 C)] 98.2 F (36.8 C) (11/01 0442) Pulse Rate:  [81-100] 90 (11/01 0442) Resp:  [12-20] 16 (11/01 0442) BP: (122-150)/(68-98) 135/77 (11/01 0442) SpO2:  [92 %-100 %] 96 % (11/01 0442) Weight:  [67 kg] 67 kg (10/31 1434) Last BM Date : 05/23/22  Intake/Output from previous day: 10/31 0701 - 11/01 0700 In: 119 [IV Piggyback:119] Out: 1850 [Urine:1850] Intake/Output this shift: No intake/output data recorded.  Constitutional: NAD; conversant; no deformities Eyes: Moist conjunctiva; no lid lag; anicteric; PERRL Neck: Trachea midline; no thyromegaly Lungs: Normal respiratory effort; no tactile fremitus CV: RRR; no palpable thrills; no pitting edema GI: Abd Softer today, nontender; no palpable hepatosplenomegaly MSK: Normal range of motion of extremities; no clubbing/cyanosis Psychiatric: Appropriate affect; alert and oriented x3 Lymphatic: edema left arm worst, some edema in legs, no significant edema right arm  Lab Results: CBC  Recent Labs    05/23/22 1312 05/24/22 0533  WBC 10.0 7.3  HGB 13.1 14.6  HCT 39.9 44.2  PLT 555* 372   BMET Recent Labs    05/24/22 0533 05/25/22 0550  NA 141 143  K 3.5 3.3*  CL 99 103  CO2 32 33*  GLUCOSE 105* 114*  BUN 11 11  CREATININE 0.91 0.79  CALCIUM 8.3* 8.6*   PT/INR Recent Labs    05/23/22 2117  LABPROT 12.7  INR 1.0   ABG Recent Labs    05/23/22 2326  HCO3 41.2*    Anti-infectives: Anti-infectives (From admission, onward)    None       Medications: Scheduled Meds:  furosemide  40 mg Intravenous BID   insulin aspart  0-9 Units Subcutaneous Q4H   pantoprazole  40 mg Oral Daily   polyethylene glycol  17 g Oral Daily   sodium chloride flush  3 mL  Intravenous Q12H   Continuous Infusions:  sodium chloride     albumin human 25 g (05/25/22 0248)   PRN Meds:.sodium chloride, oxyCODONE, sodium chloride flush  Assessment/Plan: Alyssa Rhodes is a 61 year old female with an abnormal ejection fraction on HIDA scan.  She has chronic abdominal pain and multiple medical issues.  I do not think her gallbladder is her primary issue.  She appears to have a lymphatic issue causing swelling, chylous pleural effusion, and anasarca which may be due to poor lymphatic drainage.  Cardiology recommended a heme-onc consult as the edema did not appear to be explained by her cardiac testing.    She may benefit from cholecystectomy in the future, but these medical issues should be sorted out prior to surgery.  Once they are figured out, her symptoms may improve without surgery.   LOS: 2 days   Felicie Morn, MD  [facility] Use [email or web address] to contact on call provider  Daily Billing: [phone] - Straightforward / Low MDM

## 2022-05-25 NOTE — Progress Notes (Signed)
Progress Note Patient: Alyssa Rhodes ITG:[id number] DOB: 03-15-1961 DOA: 05/23/2022  DOS: the patient was seen and examined on 05/25/2022  Brief hospital course: [name] is a 61 yo female with PMH RA (previously on MTX, just taken off per rheumatology on 10/24), DMII, HLD, HTN who presented with recurrent right-sided abdominal pain. Of note, she says she does not have Lupus despite showing on her PMH.  This has been an ongoing complaint for several months.  She has gone recent work-up with surgery including multiple imaging studies and HIDA scan performed 04/11/2022.  EF 18% on HIDA scan with patent cystic duct and CBD.  She has also been having extensive generalized edema for several months.  She was evaluated outpatient by general surgery regarding possible cholecystectomy but due to her volume overload, was considered high risk for surgery until edema improved and etiology found. Echo was performed 03/15/2022 which showed normal EF, 60 to 65%, no RWMA, normal diastolic parameters.  Small pericardial effusion. She has undergone multiple duplex studies.  All negative for DVT.  She has also followed up with cardiology, recently seen on 04/27/2022.  She has been trialed with compression stockings with minimal effect due to discomfort and being too tight.  Lasix was transitioned to torsemide.  Amlodipine was discontinued altogether in case of contributing. Her edema is considered noncardiac in etiology.  She was recommended for referral to heme-onc to consider bone marrow biopsy.   She is admitted for her recurrent abdominal pain as noted and further work-up of possible regarding her ongoing edema. Assessment and Plan: Anasarca - Ongoing issue for several months now.  Has been evaluated by cardiology recently on 04/27/2022.  Edema is considered noncardiac in origin (negative BNP as well). See cardiology note as well -Of note, right upper extremity is spared and has no edema -Does have significant  bilateral pleural effusions and small pericardial effusion. - for now will try and diurese as best as possible with IV lasix and albumin and monitor response to this.  We will discontinue albumin. -Of note, did discuss case with hematology (Dr. Julien Nordmann) and there was no pressing need for inpatient consultation; we reviewed prior thrombocytosis (over 1 million) and K/L ratio etc. For now, plan is to continue treatments as outlined, but if PLTC were to uptrend once again, can consider formal inpatient heme/onc evaluation for possible bone marrow biopsy or other input   Abdominal pain - Patient having ongoing intermittent abdominal pain since approximately May.  She endorses that food typically triggers her pain and therefore she does not eat much.  Denies any bloody bowel movements associated with eating.  No post prandial pain -She has previously undergone work-up with general surgery with possible consideration for cholecystectomy however her ongoing anasarca has been deterrent for surgery -HIDA scan performed 04/11/2022 noted with EF 18% but patent cystic duct and CBD.  Despite patent ducts, my concern would be for underlying biliary dyskinesia contributing to her pain.  Her current pain does not appear to be coming from gallbladder and therefore agree with surgery to defer the consideration for cholecystectomy for now until her acute medical issues are resolved. 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  Would also attempt to repeat thoracentesis to ultimately have both sides drained -Multiple studies ordered  for testing including cytology.  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 Body mass index is 27.91 kg/m. Nutrition Problem: Severe Malnutrition Etiology: chronic illness (abdominal pain, anasarca) Nutrition Interventions: Interventions: Premier Protein, MVI, Prostat   Subjective: No nausea no vomiting no fever no chills.  Continues to report abdominal pain which is bilateral and epigastric region.  Does report leg pain.  Bilateral.  Also has swelling in the left hand  Physical Exam: Vitals:   05/24/22 1830 05/24/22 2229 05/25/22 0227 05/25/22 0442  BP: (!) 145/86 (!) 146/90 (!) 150/96 135/77  Pulse: 100 98 97 90  Resp: _0 Temp: 98 F (36.7 C) 98.4 F (36.9 C) 98.3 F (36.8 C) 98.2 F (36.8 C)  TempSrc: Oral Oral Oral Oral  SpO2: 97% 100% 100% 96%  Weight:      Height:       General: Appear in moderate distress; no visible Abnormal Neck Mass Or lumps, Conjunctiva normal Cardiovascular: S1 and S2 Present, no Murmur, Respiratory: good respiratory effort, Bilateral Air entry present and CTA, no Crackles, no wheezes Abdomen: Bowel Sound present,  diffuse upper abdominal pain Extremities: bilateral Pedal edema Neurology: alert and oriented to time, place, and person  Gait not checked due to patient safety concerns   Data Reviewed: I have Reviewed nursing notes, Vitals, and Lab results since pt's last encounter. Pertinent lab results CBC and BMP I have ordered test including CBC and BMP    Family Communication: No one at bedside  Disposition: Status is: Inpatient Remains inpatient appropriate because: Need further work-up. SCDs Start: 05/24/22 0055   Level of care: Telemetry Telemetry reviewed, shows NSR and Continue Telemetry due to CHF   Author: Berle Mull, MD 05/25/2022 6:35 PM Please look on [email or web address] to find out who is on call.

## 2022-05-25 NOTE — Evaluation (Signed)
Physical Therapy Evaluation Patient Details Name: Alyssa Rhodes MRN: [id number] DOB: 1960/12/20 Today's Date: 05/25/2022  History of Present Illness  Patient is 61 y.o. female admitted 05/23/22 with recurrent right-sided abdominal pain. She has also been having extensive generalized edema for several months. PMH includes: RA (previously on MTX, just taken off per rheumatology on 10/24), DMII, HLD, HTN.   Clinical Impression  Alyssa Rhodes is 61 y.o. female admitted with above HPI and diagnosis. Patient is currently limited by functional impairments below (see PT problem list). Patient lives with her son and is independent at baseline. Currently she is limited by pain and edema and requires min guard/assist for transfers and gait with RW. Patient will benefit from continued skilled PT interventions to address impairments and progress independence with mobility, recommending HHPT. Acute PT will follow and progress as able.        Recommendations for follow up therapy are one component of a multi-disciplinary discharge planning process, led by the attending physician.  Recommendations may be updated based on patient status, additional functional criteria and insurance authorization.  Follow Up Recommendations Home health PT      Assistance Recommended at Discharge Intermittent Supervision/Assistance  Patient can return home with the following  A little help with walking and/or transfers;A little help with bathing/dressing/bathroom;Assistance with cooking/housework;Assist for transportation;Help with stairs or ramp for entrance    Equipment Recommendations None recommended by PT  Recommendations for Other Services       Functional Status Assessment Patient has had a recent decline in their functional status and demonstrates the ability to make significant improvements in function in a reasonable and predictable amount of time.     Precautions / Restrictions Precautions Precautions:  Fall Precaution Comments: watch SpO2 Restrictions Weight Bearing Restrictions: No      Mobility  Bed Mobility Overal bed mobility: Needs Assistance Bed Mobility: Supine to Sit     Supine to sit: Min guard, HOB elevated     General bed mobility comments: pt taking increased time, VC's to use bed rail to pivot to EOB and scoot to place feet on floor required.    Transfers Overall transfer level: Needs assistance Equipment used: 1 person hand held assist Transfers: Sit to/from Stand, Bed to chair/wheelchair/BSC Sit to Stand: Min assist   Step pivot transfers: Min assist       General transfer comment: HHA for balance, pt slow to power up and cues to sequence turn/safe reach to recliner.    Ambulation/Gait Ambulation/Gait assistance: Min guard Gait Distance (Feet): 40 Feet Assistive device: Rolling walker (2 wheels) Gait Pattern/deviations: Decreased step length - left, Step-through pattern, Decreased step length - right, Decreased stride length, Shuffle, Decreased dorsiflexion - left, Decreased dorsiflexion - right Gait velocity: decr     General Gait Details: low slow steps with RW, close guard for safety, no LOB. Pt with difficulty lifting for hip flexion and poor dorsiflexion 2/2 heavy feeling in bil LE's.  Stairs            Wheelchair Mobility    Modified Rankin (Stroke Patients Only)       Balance Overall balance assessment: Needs assistance Sitting-balance support: Feet supported Sitting balance-Leahy Scale: Good     Standing balance support: During functional activity, Bilateral upper extremity supported, Reliant on assistive device for balance Standing balance-Leahy Scale: Poor  Pertinent Vitals/Pain Pain Assessment Pain Assessment: 0-10 Pain Score: 7  (before medication it was 10/10) Pain Location: LE's and abdomen Pain Descriptors / Indicators: Discomfort, Sore Pain Intervention(s): Limited  activity within patient's tolerance, Monitored during session, Repositioned, Premedicated before session    Home Living Family/patient expects to be discharged to:: Private residence Living Arrangements: Children (son) Available Help at Discharge: Available PRN/intermittently Type of Home: House Home Access: Level entry       Home Layout: One level Home Equipment: Conservation officer, nature (2 wheels) Additional Comments: son works 4 hrs/day, works from home    Prior Function Prior Level of Function : Independent/Modified Independent             Mobility Comments: 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 swelling greater in thighs around latearl aspect of knees LLE Deficits / Details: bil LE's with edema, firm and indurated throughout. swelling greater in thighs around latearl aspect of knees    Cervical / Trunk Assessment Cervical / Trunk Assessment: Normal  Communication   Communication: No difficulties  Cognition Arousal/Alertness: Awake/alert Behavior During Therapy: WFL for tasks assessed/performed Overall Cognitive Status: Within Functional Limits for tasks assessed                                          General Comments      Exercises Other Exercises Other Exercises: educated on retrograde massag for lymphatic drainage   Assessment/Plan    PT Assessment Patient needs continued PT services  PT Problem List Decreased strength;Decreased range of motion;Decreased activity  tolerance;Decreased balance;Decreased mobility;Decreased knowledge of use of DME;Decreased knowledge of precautions;Pain       PT Treatment Interventions Balance training;Therapeutic exercise;Therapeutic activities;Functional mobility training;Stair training;Gait training;DME instruction;Patient/family education;Manual techniques    PT Goals (Current goals can be found in the Care Plan section)  Acute Rehab PT Goals Patient Stated Goal: legs to stop hurting/swelling to go down PT Goal Formulation: With patient Time For Goal Achievement: 06/08/22 Potential to Achieve Goals: Good    Frequency Min 3X/week     Co-evaluation               AM-PAC PT "6 Clicks" Mobility  Outcome Measure Help needed turning from your back to your side while in a flat bed without using bedrails?: A Little Help needed moving from lying on your back to sitting on the side of a flat bed without using bedrails?: A Little Help needed moving to and from a bed to a chair (including a wheelchair)?: A Little Help needed standing up from a chair using your arms (e.g., wheelchair or bedside chair)?: A Little Help needed to walk in hospital room?: A Little Help needed climbing 3-5 steps with a railing? : A Lot 6 Click Score: 17    End of Session Equipment Utilized During Treatment: Gait belt Activity Tolerance: Patient tolerated treatment well Patient left: in chair;with call bell/phone within reach Nurse Communication: Mobility status PT Visit Diagnosis: Muscle weakness (generalized) (M62.81);Difficulty  in walking, not elsewhere classified (R26.2);Unsteadiness on feet (R26.81)    Time: 5800-6349 PT Time Calculation (min) (ACUTE ONLY): 27 min   Charges:   PT Evaluation $PT Eval Moderate Complexity: 1 Mod PT Treatments $Gait Training: 8-22 mins        Verner Mould, DPT Acute Rehabilitation Services Office [phone]  05/25/22 3:26 PM

## 2022-05-25 NOTE — Progress Notes (Addendum)
Initial Nutrition Assessment  DOCUMENTATION CODES:   Severe malnutrition in context of chronic illness  INTERVENTION:   -Ensure MAX Protein po BID, each supplement provides 150 kcal and 30 grams of protein (1.5g fat per bottle)  -Multivitamin with minerals daily  -Prosource Plus PO TID, each provides 100 kcals and 15g protein (0g fat)  NUTRITION DIAGNOSIS:   Severe Malnutrition related to chronic illness (abdominal pain, anasarca) as evidenced by energy intake < or equal to 75% for > or equal to 1 month, moderate fat depletion, severe muscle depletion.  GOAL:   Patient will meet greater than or equal to 90% of their needs  MONITOR:   PO intake, Supplement acceptance, Weight trends, I & O's, Labs  REASON FOR ASSESSMENT:   Consult Assessment of nutrition requirement/status  ASSESSMENT:   61 yo female with PMH RA (previously on MTX, just taken off per rheumatology on 10/24), DMII, HLD, HTN. Admitted with right-sided abdominal pain.  10/31: s/p 1.3L chylous fluid  Patient reports abdominal pain that has lasted for 6 months now. Pt quit smoking in January and quite drinking sometime this summer. Pt states she has had work-up and admissions to try and find out what is causing her edema. This has impacted her ADLs and mobility. Pt is unable to eat fried foods without getting sick. States the foods she tolerates the best are fruits and rice krispies. States she is aware to avoid fatty foods but reports she ate chili one day and got very sick. Pt ate bacon this morning for breakfast. Re-emphasized the need to limit fats in diet. Pt is agreeable to trying Ensure Max supplements for additional protein as pt has a hard time consuming meats d/t chewing difficulty. Pt had her back teeth removed. Will also trial Prosource supplements for protein. Per surgery note, no plans for surgery at this time.  Patient reports in May 2023 she weighed ~115 lbs prior to edema starting.  Pt has since had  weight gain r/t fluid accumulation.   Medications: IV Lasix, Miralax, KLOR-CON  Labs reviewed: CBGs: 82-151 Low K   NUTRITION - FOCUSED PHYSICAL EXAM:  Flowsheet Row Most Recent Value  Orbital Region Moderate depletion  Upper Arm Region Severe depletion  [right arm, left arm with edema]  Thoracic and Lumbar Region Unable to assess  Buccal Region Moderate depletion  Temple Region Severe depletion  Clavicle Bone Region Mild depletion  Clavicle and Acromion Bone Region Mild depletion  Scapular Bone Region Mild depletion  Dorsal Hand Severe depletion  [right hand]  Patellar Region Unable to assess  [edema]  Anterior Thigh Region Unable to assess  Posterior Calf Region Mild depletion  Edema (RD Assessment) Mild  Hair Reviewed  Eyes Reviewed  Mouth Reviewed  [poor dentition, missing back teeth]  Skin Reviewed  Nails Reviewed       Diet Order:   Diet Order             Diet Heart Room service appropriate? Yes; Fluid consistency: Thin  Diet effective now                   EDUCATION NEEDS:   Education needs have been addressed  Skin:  Skin Assessment: Reviewed RN Assessment  Last BM:  10/30  Height:   Ht Readings from Last 1 Encounters:  05/24/22 '5\' 1"'$  (1.549 m)    Weight:   Wt Readings from Last 1 Encounters:  05/24/22 67 kg    BMI:  Body mass index is 27.[place]  kg/m.  Estimated Nutritional Needs:   Kcal:  1700-1900  Protein:  85-100g  Fluid:  1.9L/day  Clayton Bibles, MS, RD, LDN Inpatient Clinical Dietitian Contact information available via Amion

## 2022-05-26 DIAGNOSIS — I3139 Other pericardial effusion (noninflammatory): Secondary | ICD-10-CM | POA: Diagnosis present

## 2022-05-26 DIAGNOSIS — K829 Disease of gallbladder, unspecified: Secondary | ICD-10-CM | POA: Diagnosis present

## 2022-05-26 DIAGNOSIS — R601 Generalized edema: Secondary | ICD-10-CM | POA: Diagnosis not present

## 2022-05-26 DIAGNOSIS — J940 Chylous effusion: Secondary | ICD-10-CM | POA: Diagnosis present

## 2022-05-26 LAB — CBC WITH DIFFERENTIAL/PLATELET
Abs Immature Granulocytes: 0.02 10*3/uL (ref 0.00–0.07)
Basophils Absolute: 0 10*3/uL (ref 0.0–0.1)
Basophils Relative: 0 %
Eosinophils Absolute: 0.3 10*3/uL (ref 0.0–0.5)
Eosinophils Relative: 4 %
HCT: 39.3 % (ref 36.0–46.0)
Hemoglobin: 12.7 g/dL (ref 12.0–15.0)
Immature Granulocytes: 0 %
Lymphocytes Relative: 20 %
Lymphs Abs: 1.2 10*3/uL (ref 0.7–4.0)
MCH: 32.6 pg (ref 26.0–34.0)
MCHC: 32.3 g/dL (ref 30.0–36.0)
MCV: 101 fL — ABNORMAL HIGH (ref 80.0–100.0)
Monocytes Absolute: 0.6 10*3/uL (ref 0.1–1.0)
Monocytes Relative: 11 %
Neutro Abs: 3.8 10*3/uL (ref 1.7–7.7)
Neutrophils Relative %: 65 %
Platelets: 491 10*3/uL — ABNORMAL HIGH (ref 150–400)
RBC: 3.89 MIL/uL (ref 3.87–5.11)
RDW: 15 % (ref 11.5–15.5)
WBC: 6 10*3/uL (ref 4.0–10.5)
nRBC: 0 % (ref 0.0–0.2)

## 2022-05-26 LAB — COMPREHENSIVE METABOLIC PANEL
ALT: 67 U/L — ABNORMAL HIGH (ref 0–44)
AST: 34 U/L (ref 15–41)
Albumin: 3.4 g/dL — ABNORMAL LOW (ref 3.5–5.0)
Alkaline Phosphatase: 54 U/L (ref 38–126)
Anion gap: 9 (ref 5–15)
BUN: 12 mg/dL (ref 8–23)
CO2: 29 mmol/L (ref 22–32)
Calcium: 8.9 mg/dL (ref 8.9–10.3)
Chloride: 101 mmol/L (ref 98–111)
Creatinine, Ser: 0.84 mg/dL (ref 0.44–1.00)
GFR, Estimated: >60 mL/min (ref >60)
Glucose, Bld: 128 mg/dL — ABNORMAL HIGH (ref 70–99)
Potassium: 4.5 mmol/L (ref 3.5–5.1)
Sodium: 139 mmol/L (ref 135–145)
Total Bilirubin: 0.6 mg/dL (ref 0.3–1.2)
Total Protein: 5.4 g/dL — ABNORMAL LOW (ref 6.5–8.1)

## 2022-05-26 LAB — GLUCOSE, CAPILLARY
Glucose-Capillary: 88 mg/dL (ref 70–99)
Glucose-Capillary: 135 mg/dL — ABNORMAL HIGH (ref 70–99)
Glucose-Capillary: 122 mg/dL — ABNORMAL HIGH (ref 70–99)
Glucose-Capillary: 132 mg/dL — ABNORMAL HIGH (ref 70–99)

## 2022-05-26 LAB — LIPID PANEL
Cholesterol: 74 mg/dL (ref 0–200)
HDL: 23 mg/dL — ABNORMAL LOW (ref >40)
LDL Cholesterol: 39 mg/dL (ref 0–99)
Total CHOL/HDL Ratio: 3.2 RATIO
Triglycerides: 60 mg/dL (ref <150)
VLDL: 12 mg/dL (ref 0–40)

## 2022-05-26 LAB — ACID FAST SMEAR (AFB, MYCOBACTERIA): Acid Fast Smear: NEGATIVE

## 2022-05-26 LAB — UREA NITROGEN, URINE: Urea Nitrogen, Ur: 425 mg/dL

## 2022-05-26 LAB — MAGNESIUM: Magnesium: 1.9 mg/dL (ref 1.7–2.4)

## 2022-05-26 LAB — C-REACTIVE PROTEIN: CRP: 0.5 mg/dL (ref <1.0)

## 2022-05-26 LAB — SEDIMENTATION RATE: Sed Rate: 15 mm/hr (ref 0–22)

## 2022-05-26 MED ORDER — FUROSEMIDE 10 MG/ML IJ SOLN
80.0000 mg | Freq: Once | INTRAMUSCULAR | Status: AC
  Administered 2022-05-26: 80 mg via INTRAVENOUS
  Filled 2022-05-26: qty 8

## 2022-05-26 MED ORDER — SENNOSIDES-DOCUSATE SODIUM 8.6-50 MG PO TABS
1.0000 | ORAL_TABLET | Freq: Two times a day (BID) | ORAL | Status: DC
  Administered 2022-05-26 – 2022-06-04 (×7): 1 via ORAL
  Filled 2022-05-26 (×18): qty 1

## 2022-05-26 NOTE — Progress Notes (Signed)
Cytology from thoracentesis still pending.  Awaiting further work up of anasarca.  Still not sure that abdominal pain is definitively coming from her gallbladder, but if she has completed her medical work up and cleared by medical team, then we can proceed with lap chole at some point during her stay.  It is possible her pain is coming from all the anasarca in her abdomen, but she does technically have a decrease in gb ejection fraction.  Will continue to monitor and follow.  Alyssa Rhodes 7:40 AM 05/26/2022

## 2022-05-26 NOTE — Progress Notes (Signed)
[facility] Progress Note Patient: Alyssa Rhodes IRC:[id number] DOB: October 17, 1960 DOA: 05/23/2022  DOS: the patient was seen and examined on 05/26/2022  Brief hospital course: Alyssa Rhodes is a 61 yo female with PMH RA (previously on MTX, just taken off per rheumatology on 10/24), DMII, HLD, HTN who presented with recurrent right-sided abdominal pain. Of note, she says she does not have Lupus despite showing on her PMH.  This has been an ongoing complaint for several months.  She has gone recent work-up with surgery including multiple imaging studies and HIDA scan performed 04/11/2022.  EF 18% on HIDA scan with patent cystic duct and CBD.  She has also been having extensive generalized edema for several months.  She was evaluated outpatient by general surgery regarding possible cholecystectomy but due to her volume overload, was considered high risk for surgery until edema improved and etiology found. Echo was performed 03/15/2022 which showed normal EF, 60 to 65%, no RWMA, normal diastolic parameters.  Small pericardial effusion. She has undergone multiple duplex studies.  All negative for DVT.  She has also followed up with cardiology, recently seen on 04/27/2022.  She has been trialed with compression stockings with minimal effect due to discomfort and being too tight.  Lasix was transitioned to torsemide.  Amlodipine was discontinued altogether in case of contributing. Her edema is considered noncardiac in etiology.  She was recommended for referral to heme-onc to consider bone marrow biopsy.   She is admitted for her recurrent abdominal pain as noted and further work-up of possible regarding her ongoing edema. 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 Suspect this is multifactorial in nature but most likely related to acute on chronic HFpEF. Continue with IV Lasix.  Left upper extremity lymphedema. Suspect this is related rheumatoid arthritis. For now we will monitor and recommend elevation. We will discuss with Alyssa Rhodes  Rheumatoid arthritis. ESR normal. Patient is currently not on any therapy for preparation for surgery.  Chylothorax. Recommend low-fat diet. Potential association with blepharitis. Biopsy currently pending.  Cholesterol level normal. Monitor.  Hypokalemia. Replacing.  Elevated LFT. Improving.  Bilateral lower leg cramping. Improving with Robaxin therapy.  Upper abdominal pain. Gallbladder disease. Will discuss with surgery.  Agree with holding off on lap chole for now.  Subjective: Upper abdominal pain still present.  No nausea no vomiting.  Reports constipation.  Lower leg pain improving.  Physical Exam: General: in mild distress;  Cardiovascular: S1 and S2 Present, no Murmur Respiratory: Good respiratory effort, Bilateral Air entry present, no Crackles, no wheezes Abdomen: Bowel Sound present, upper abdomen tenderness.  No distention. Extremities: Bilateral lower extremity edema.  Improving left upper extremity edema. Neurology: alert and oriented to time, place, and person   Data Reviewed: I have Reviewed nursing notes, Vitals, and Lab results. Since last encounter, pertinent lab results CBC and CMP   . I have ordered test including CBC and CMP  . I have reviewed the last note from general surgery  .    Disposition: Status is: Inpatient Remains inpatient appropriate because: Need for IV diuresis  SCDs Start: 05/24/22 0055   Family Communication: No one at  bedside Level of care: Telemetry Telemetry reviewed, shows frequent PVCs and Continue Telemetry due to  CHF  Vitals:   05/25/22 1659 05/25/22 2225 05/26/22 0545 05/26/22 0631  BP: 134/75 127/77 (!) 141/99   Pulse: 94 91 88   Resp: _0 Temp: 98.7 F (37.1 C) 98.4 F (36.9 C) 98 F (36.7 C)   TempSrc: Oral Oral Oral   SpO2: 99% 97% 97%   Weight:    67.2 kg  Height:         Author: Berle Mull, MD 05/26/2022 6:06 PM  Please look on [email or web address] to find out who is on call.

## 2022-05-26 NOTE — Progress Notes (Signed)
Occupational Therapy Treatment Patient Details Name: Alyssa Rhodes MRN: [id number] DOB: 05/25/1961 Today's Date: 05/26/2022   History of present illness Alyssa Rhodes is a 61 y.o. female with medical history significant of RA, anasarca, biliary diskenesia, DM2, HTN, HLD.  Presented with right upper quadrant pain  Presents with abdominal pain nausea vomiting and lower extremity swelling on the left arm as well has been ongoing for the past few months she has known history of rheumatoid arthritis for which she takes methotrexate  Has been seen by nephrology and started on torsemide to see if that would help with fluid overload.  She does have chronic abdominal pain but it has gotten much worse today.  Also noted more of a right upper quadrant pain she is not able to tolerate anything by mouth for past few days.  It feels like the swelling is increasing.  No fevers or chills.     It seems the patient has been seen at [facility] for the same complaints of right upper quadrant discomfort was not able to be cleared for surgery secondary to hypoxia she was diagnosed with biliary dyskinesia.     Patient have had at least 50 pound weight gain over the past 5 months recently was on prednisone taper but now has completed it.  General surgery did recommend laparoscopic cholecystectomy  But patient seem to be too unstable for that  Patient has been seen by cardiology in the past echogram showed no evidence of CHF   OT comments  Patient was motivated to participate in the session. She performed functional transfers and ambulating in the room using a rolling walker with SBA. She reported feelings of B LE stiffness, edema, and heaviness, as well as "labored" breathing during progressive activity. She was instructed on simple therapeutic exercises to assist with edema and stiffness, for which she provided teach back with supervision while seated in the chair. She was also instructed on implementing pursed lip breathing exercises as  needed during functional activity, and elevating her B LE for edema management. Continue OT plan of care.    Recommendations for follow up therapy are one component of a multi-disciplinary discharge planning process, led by the attending physician.  Recommendations may be updated based on patient status, additional functional criteria and insurance authorization.    Follow Up Recommendations  Home health OT    Assistance Recommended at Discharge Intermittent Supervision/Assistance  Patient can return home with the following  A lot of help with bathing/dressing/bathroom;Assistance with cooking/housework;Assist for transportation;Help with stairs or ramp for entrance   Equipment Recommendations  Tub/shower bench;BSC/3in1       Precautions / Restrictions Precautions Precaution Comments: watch SpO2 Restrictions Weight Bearing Restrictions: No       Mobility Bed Mobility    General bed mobility comments: the pt was received seated in the chair    Transfers Overall transfer level: Needs assistance   Transfers: Sit to/from Stand Sit to Stand: Supervision                     ADL either performed or assessed with clinical judgement   ADL   Eating/Feeding: Independent   Grooming: Wash/dry hands;Set up;Sitting Grooming Details (indicate cue type and reason): in chair         Upper Body Dressing : Set up;Sitting       Toilet Transfer: Min guard                   Cognition Arousal/Alertness:  Awake/alert Behavior During Therapy: WFL for tasks assessed/performed Overall Cognitive Status: Within Functional Limits for tasks assessed                          Pertinent Vitals/ Pain       Pain Assessment Pain Score: 3  Pain Location: LE's and abdomen Pain Intervention(s): Limited activity within patient's tolerance         Frequency  Min 2X/week        Progress Toward Goals  OT Goals(current goals can now be found in the care plan  section)  Progress towards OT goals: Progressing toward goals  Acute Rehab OT Goals OT Goal Formulation: With patient Time For Goal Achievement: 06/07/22 Potential to Achieve Goals: Good  Plan Discharge plan remains appropriate       AM-PAC OT "6 Clicks" Daily Activity     Outcome Measure   Help from another person eating meals?: None Help from another person taking care of personal grooming?: None Help from another person toileting, which includes using toliet, bedpan, or urinal?: A Little Help from another person bathing (including washing, rinsing, drying)?: A Little Help from another person to put on and taking off regular upper body clothing?: A Little Help from another person to put on and taking off regular lower body clothing?: A Lot 6 Click Score: 19    End of Session    OT Visit Diagnosis: Unsteadiness on feet (R26.81);Muscle weakness (generalized) (M62.81);Pain   Activity Tolerance Patient tolerated treatment well   Patient Left in chair;with call bell/phone within reach           Time: 1237-1252 OT Time Calculation (min): 15 min  Charges: OT General Charges $OT Visit: 1 Visit OT Treatments $Therapeutic Activity: 8-22 mins    Leota Sauers, OTR/L 05/26/2022, 1:07 PM

## 2022-05-27 DIAGNOSIS — R601 Generalized edema: Secondary | ICD-10-CM | POA: Diagnosis not present

## 2022-05-27 DIAGNOSIS — I890 Lymphedema, not elsewhere classified: Secondary | ICD-10-CM

## 2022-05-27 LAB — CBC WITH DIFFERENTIAL/PLATELET
Abs Immature Granulocytes: 0.04 10*3/uL (ref 0.00–0.07)
Basophils Absolute: 0 10*3/uL (ref 0.0–0.1)
Basophils Relative: 1 %
Eosinophils Absolute: 0.3 10*3/uL (ref 0.0–0.5)
Eosinophils Relative: 4 %
HCT: 34.3 % — ABNORMAL LOW (ref 36.0–46.0)
Hemoglobin: 11.1 g/dL — ABNORMAL LOW (ref 12.0–15.0)
Immature Granulocytes: 1 %
Lymphocytes Relative: 18 %
Lymphs Abs: 1 10*3/uL (ref 0.7–4.0)
MCH: 32.6 pg (ref 26.0–34.0)
MCHC: 32.4 g/dL (ref 30.0–36.0)
MCV: 100.9 fL — ABNORMAL HIGH (ref 80.0–100.0)
Monocytes Absolute: 0.5 10*3/uL (ref 0.1–1.0)
Monocytes Relative: 9 %
Neutro Abs: 4 10*3/uL (ref 1.7–7.7)
Neutrophils Relative %: 67 %
Platelets: 425 10*3/uL — ABNORMAL HIGH (ref 150–400)
RBC: 3.4 MIL/uL — ABNORMAL LOW (ref 3.87–5.11)
RDW: 14.6 % (ref 11.5–15.5)
WBC: 5.8 10*3/uL (ref 4.0–10.5)
nRBC: 0 % (ref 0.0–0.2)

## 2022-05-27 LAB — CYTOLOGY - NON PAP

## 2022-05-27 LAB — COMPREHENSIVE METABOLIC PANEL
ALT: 52 U/L — ABNORMAL HIGH (ref 0–44)
AST: 30 U/L (ref 15–41)
Albumin: 3.1 g/dL — ABNORMAL LOW (ref 3.5–5.0)
Alkaline Phosphatase: 53 U/L (ref 38–126)
Anion gap: 10 (ref 5–15)
BUN: 20 mg/dL (ref 8–23)
CO2: 29 mmol/L (ref 22–32)
Calcium: 8.3 mg/dL — ABNORMAL LOW (ref 8.9–10.3)
Chloride: 97 mmol/L — ABNORMAL LOW (ref 98–111)
Creatinine, Ser: 0.89 mg/dL (ref 0.44–1.00)
GFR, Estimated: >60 mL/min (ref >60)
Glucose, Bld: 131 mg/dL — ABNORMAL HIGH (ref 70–99)
Potassium: 3.7 mmol/L (ref 3.5–5.1)
Sodium: 136 mmol/L (ref 135–145)
Total Bilirubin: 0.6 mg/dL (ref 0.3–1.2)
Total Protein: 5.5 g/dL — ABNORMAL LOW (ref 6.5–8.1)

## 2022-05-27 LAB — MAGNESIUM: Magnesium: 1.7 mg/dL (ref 1.7–2.4)

## 2022-05-27 LAB — GLUCOSE, CAPILLARY
Glucose-Capillary: 129 mg/dL — ABNORMAL HIGH (ref 70–99)
Glucose-Capillary: 149 mg/dL — ABNORMAL HIGH (ref 70–99)
Glucose-Capillary: 180 mg/dL — ABNORMAL HIGH (ref 70–99)
Glucose-Capillary: 138 mg/dL — ABNORMAL HIGH (ref 70–99)

## 2022-05-27 MED ORDER — POTASSIUM CHLORIDE CRYS ER 20 MEQ PO TBCR
20.0000 meq | EXTENDED_RELEASE_TABLET | Freq: Every day | ORAL | Status: AC
  Administered 2022-05-27 – 2022-05-28 (×2): 20 meq via ORAL
  Filled 2022-05-27 (×2): qty 1

## 2022-05-27 MED ORDER — METOLAZONE 2.5 MG PO TABS
2.5000 mg | ORAL_TABLET | ORAL | Status: DC
  Administered 2022-05-27: 2.5 mg via ORAL
  Filled 2022-05-27: qty 1

## 2022-05-27 MED ORDER — CYCLOBENZAPRINE HCL 5 MG PO TABS
5.0000 mg | ORAL_TABLET | Freq: Three times a day (TID) | ORAL | Status: DC
  Administered 2022-05-27 – 2022-05-28 (×3): 5 mg via ORAL
  Filled 2022-05-27 (×3): qty 1

## 2022-05-27 MED ORDER — FUROSEMIDE 10 MG/ML IJ SOLN
80.0000 mg | Freq: Three times a day (TID) | INTRAMUSCULAR | Status: DC
  Administered 2022-05-27 – 2022-05-28 (×3): 80 mg via INTRAVENOUS
  Filled 2022-05-27 (×3): qty 8

## 2022-05-27 NOTE — Progress Notes (Signed)
Physical Therapy Treatment Patient Details Name: Alyssa Rhodes MRN: [id number] DOB: [birth year] Today's Date: 05/27/2022   History of Present Illness Ms. Osentoski is a 61 yo female with PMH RA (previously on MTX, just taken off per rheumatology on 10/24), DMII, HLD, HTN who presented with recurrent right-sided abdominal pain. Of note, she says she does not have Lupus despite showing on her PMH.   This has been an ongoing complaint for several months.  She has gone recent work-up with surgery including multiple imaging studies and HIDA scan performed 04/11/2022.  EF 18% on HIDA scan with patent cystic duct and CBD.     She has also been having extensive generalized edema for several months.  She was evaluated outpatient by general surgery regarding possible cholecystectomy but due to her volume overload, was considered high risk for surgery until edema improved and etiology found.  Echo was performed 03/15/2022 which showed normal EF, 60 to 65%, no RWMA, normal diastolic parameters.  Small pericardial effusion.  She has undergone multiple duplex studies.  All negative for DVT.     She has also followed up with cardiology, recently seen on 04/27/2022.  She has been trialed with compression stockings with minimal effect due to discomfort and being too tight.  Lasix was transitioned to torsemide.  Amlodipine was discontinued altogether in case of contributing.  Her edema is considered noncardiac in etiology.  She was recommended for referral to heme-onc to consider bone marrow biopsy.      She is admitted for her recurrent abdominal pain as noted and further work-up of possible regarding her ongoing edema.  Assessment and Plan:    PT Comments    General Comments: AxO x 3 very sweet lady.  Stated she has been dealing with this "Anascara" since May of this year.  "I use to be a size 4/6" "They still don't know why I am so big".  Pt also stated her feet are bigger and had to buy bigger shoes. Assisted OOB to amb in  hallway.  General Gait Details: low slow steps with RW, close guard for safety, no LOB. Pt with difficulty lifting for hip flexion and poor dorsiflexion 2/2 heavy feeling in bil LE's."feels like all the fluid is going down".  Mild dyspnea due to enlarged ABD most likely restricting normal diaphoramic breathing.  Avg RA sats with amb 94%. Pt plans to return home where she has a Son whom lives with her.   Recommendations for follow up therapy are one component of a multi-disciplinary discharge planning process, led by the attending physician.  Recommendations may be updated based on patient status, additional functional criteria and insurance authorization.  Follow Up Recommendations  Home health PT     Assistance Recommended at Discharge Intermittent Supervision/Assistance  Patient can return home with the following A little help with walking and/or transfers;A little help with bathing/dressing/bathroom;Assistance with cooking/housework;Assist for transportation;Help with stairs or ramp for entrance   Equipment Recommendations  None recommended by PT    Recommendations for Other Services       Precautions / Restrictions Precautions Precautions: Fall Precaution Comments: monitor sats Restrictions Weight Bearing Restrictions: No     Mobility  Bed Mobility Overal bed mobility: Needs Assistance Bed Mobility: Supine to Sit     Supine to sit: Supervision, Min guard     General bed mobility comments: self able with increased time.    Transfers Overall transfer level: Needs assistance Equipment used: 1 person hand held assist Transfers: Sit to/from  Stand Sit to Stand: Supervision           General transfer comment: HHA for balance, pt slow to power up and cues to sequence turn/safe reach to recliner.    Ambulation/Gait Ambulation/Gait assistance: Supervision, Min guard Gait Distance (Feet): 65 Feet Assistive device: Rolling walker (2 wheels) Gait Pattern/deviations:  Decreased step length - left, Step-through pattern, Decreased step length - right, Decreased stride length, Shuffle, Decreased dorsiflexion - left, Decreased dorsiflexion - right Gait velocity: decreased     General Gait Details: low slow steps with RW, close guard for safety, no LOB. Pt with difficulty lifting for hip flexion and poor dorsiflexion 2/2 heavy feeling in bil LE's."feels like all the fluid is going down".  Mild dyspnea due to enlarged ABD most likely restricting normal diaphoramic breathing.  Avg RA sats with amb 94%.   Stairs             Wheelchair Mobility    Modified Rankin (Stroke Patients Only)       Balance                                            Cognition Arousal/Alertness: Awake/alert Behavior During Therapy: WFL for tasks assessed/performed Overall Cognitive Status: Within Functional Limits for tasks assessed                                 General Comments: AxO x 3 very sweet lady.  Stated she has been dealing with this "Anascara" since May of this year.  "I use to be a size 4/6" "They still don't know why I am so big".  Pt also stated her feet are bigger and had to buy bigger shoes.        Exercises      General Comments        Pertinent Vitals/Pain Pain Assessment Pain Assessment: Faces Faces Pain Scale: Hurts little more Pain Location: ABD left Pain Descriptors / Indicators: Grimacing Pain Intervention(s): Monitored during session    Home Living                          Prior Function            PT Goals (current goals can now be found in the care plan section) Progress towards PT goals: Progressing toward goals    Frequency    Min 3X/week      PT Plan Current plan remains appropriate    Co-evaluation              AM-PAC PT "6 Clicks" Mobility   Outcome Measure  Help needed turning from your back to your side while in a flat bed without using bedrails?: A  Little Help needed moving from lying on your back to sitting on the side of a flat bed without using bedrails?: A Little Help needed moving to and from a bed to a chair (including a wheelchair)?: A Little Help needed standing up from a chair using your arms (e.g., wheelchair or bedside chair)?: A Little Help needed to walk in hospital room?: A Little Help needed climbing 3-5 steps with a railing? : A Lot 6 Click Score: 17    End of Session Equipment Utilized During Treatment: Gait belt Activity Tolerance: Patient limited by fatigue  Patient left: in chair;with call bell/phone within reach Nurse Communication: Mobility status PT Visit Diagnosis: Muscle weakness (generalized) (M62.81);Difficulty in walking, not elsewhere classified (R26.2);Unsteadiness on feet (R26.81)     Time: 5462-7035 PT Time Calculation (min) (ACUTE ONLY): 21 min  Charges:  $Gait Training: 8-22 mins                     {Timberlynn Kizziah  PTA Acute  [employer] M-F          [phone] Weekend pager [phone]

## 2022-05-27 NOTE — Plan of Care (Signed)
Pt c/o pain 8/10 this am.  PRN administered by nightshift RN.  No other complaints at this time.  Problem: Education: Goal: Ability to describe self-care measures that may prevent or decrease complications (Diabetes Survival Skills Education) will improve Outcome: Progressing Goal: Individualized Educational Video(s) Outcome: Progressing   Problem: Coping: Goal: Ability to adjust to condition or change in health will improve Outcome: Progressing   Problem: Fluid Volume: Goal: Ability to maintain a balanced intake and output will improve Outcome: Progressing   Problem: Health Behavior/Discharge Planning: Goal: Ability to identify and utilize available resources and services will improve Outcome: Progressing Goal: Ability to manage health-related needs will improve Outcome: Progressing   Problem: Metabolic: Goal: Ability to maintain appropriate glucose levels will improve Outcome: Progressing   Problem: Nutritional: Goal: Maintenance of adequate nutrition will improve Outcome: Progressing Goal: Progress toward achieving an optimal weight will improve Outcome: Progressing   Problem: Skin Integrity: Goal: Risk for impaired skin integrity will decrease Outcome: Progressing   Problem: Tissue Perfusion: Goal: Adequacy of tissue perfusion will improve Outcome: Progressing   Problem: Education: Goal: Knowledge of General Education information will improve Description: Including pain rating scale, medication(s)/side effects and non-pharmacologic comfort measures Outcome: Progressing   Problem: Health Behavior/Discharge Planning: Goal: Ability to manage health-related needs will improve Outcome: Progressing   Problem: Clinical Measurements: Goal: Ability to maintain clinical measurements within normal limits will improve Outcome: Progressing Goal: Will remain free from infection Outcome: Progressing Goal: Diagnostic test results will improve Outcome: Progressing Goal:  Respiratory complications will improve Outcome: Progressing Goal: Cardiovascular complication will be avoided Outcome: Progressing   Problem: Activity: Goal: Risk for activity intolerance will decrease Outcome: Progressing   Problem: Nutrition: Goal: Adequate nutrition will be maintained Outcome: Progressing   Problem: Coping: Goal: Level of anxiety will decrease Outcome: Progressing   Problem: Elimination: Goal: Will not experience complications related to bowel motility Outcome: Progressing Goal: Will not experience complications related to urinary retention Outcome: Progressing   Problem: Pain Managment: Goal: General experience of comfort will improve Outcome: Progressing   Problem: Safety: Goal: Ability to remain free from injury will improve Outcome: Progressing   Problem: Skin Integrity: Goal: Risk for impaired skin integrity will decrease Outcome: Progressing

## 2022-05-27 NOTE — Progress Notes (Signed)
[facility] Progress Note Patient: Alyssa Rhodes:[id number] DOB: [birth year] DOA: 05/23/2022  DOS: the patient was seen and examined on 05/27/2022  Brief hospital course: Ms. Alyssa Rhodes is a 61 yo female with PMH RA (previously on MTX, just taken off per rheumatology on 10/24), DMII, HLD, HTN who presented with recurrent right-sided abdominal pain.  Has history of rheumatoid arthritis and follows up with Dr. Amil Rhodes.   Recently had work-up with surgery including multiple imaging studies and HIDA scan performed 04/11/2022.  EF 18% on HIDA scan with patent cystic duct and CBD. She has also been having extensive generalized edema for several months.  Echo was performed 03/15/2022 which showed normal EF, 60 to 65%, no RWMA, normal diastolic parameters.  Small pericardial effusion. She has undergone multiple duplex studies.  All negative for DVT. She was seen by cardiology, on 04/27/2022.  They felt her edema is noncardiac in etiology and recommended for referral to heme-onc to consider bone marrow biopsy. Currently being aggressively diuresed with IV Lasix 80 twice daily and responding well. Assessment and Plan: Acute on chronic HFpEF. Continue with IV Lasix. Add Zaroxolyn. Increase frequency of Lasix from 80 mg twice daily to 80 mg 3 times daily. Monitor renal function. Patient currently agreeable to use Unna boots. Check TSH, free T4, cortisol level. If does not improve will consider cardiology consult for possible right heart cath  Left upper extremity lymphedema. Most likely in the setting of rheumatoid arthritis. Monitor. Elevate.  Generalized body ache. Continue muscle relaxant, switch from Robaxin to Flexeril.  Acute on chronic upper abdominal pain. Pain appears to be more in the setting of edema rather than in the setting of cholecystitis or gallbladder sludge. For now focusing on treating heart failure.  Failure to thrive. Severe protein calorie malnutrition. Placing the patient  at high risk of poor outcome. Continue supplements.  Hypokalemia. Replacing.  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  No nausea no vomiting.  Passing gas.  Had a bowel movement yesterday.  Lower extremity edema worsening.  Physical Exam: General: in moderate distress;  Cardiovascular: S1 and S2 Present, no Murmur Respiratory: Normal respiratory effort, Bilateral Air entry present, faint basal Crackles, no wheezes Abdomen: Bowel Sound present, diffusely tender everywhere. Extremities: Bilateral lower extreme edema as well as left lower extremity edema. Neurology: alert and oriented to time, place, and person   Data Reviewed: I have Reviewed nursing notes, Vitals, and Lab results. Since last encounter, pertinent lab results CBC and BMP   . I have ordered test including CBC and BMP  . I have discussed pt's care plan and test results with general surgery  .   Disposition: Status is: Inpatient Remains inpatient appropriate because: Need aggressive IV diuresis  SCDs Start: 05/24/22 0055   Family Communication: No one at bedside Level of care: Telemetry  Vitals:   05/26/22 0545 05/26/22 0631 05/26/22 2127 05/27/22 0541  BP: (!) 141/99  128/71 123/65  Pulse: 88  98 97  Resp: _0 Temp: 98 F (36.7 C)  97.9 F (36.6 C) 98.1 F (36.7  C)  TempSrc: Oral  Oral Oral  SpO2: 97%  98% 99%  Weight:  67.2 kg    Height:         Author: Berle Mull, MD 05/27/2022 6:04 PM  Please look on [email or web address] to find out who is on call.

## 2022-05-27 NOTE — TOC Initial Note (Signed)
Transition of Care [facility]) - Initial/Assessment Note    Patient Details  Name: Alyssa Rhodes MRN: [id number] Date of Birth: 1961/03/10  Transition of Care [facility]) CM/SW Contact:    Angelita Ingles, RN Phone [phone]  05/27/2022, 12:08 PM  Clinical Narrative:                 TOC following for patient with high risk for readmission. Patient states that she is from home where she functions independently. Patient states that she follows up with PCP at [facility] although she can't recall her MD's name stating that she calls her Dr. Vania Rea. Patient states that her som lives with her but she drives herself to appointments and still works. Patient reports that medications are accessible and affordable. Patient reports that she has no Dme in the home and is looking to purchase a shower chair with the understanding that insurance does not cover the cost of shower chair. Patient currently refusing recommended 3 in 1. Patient has been made aware that she has recommendations for home health PT/OT. Patient declines to have therapy set up stating that she plans to return to work and remain completely independent. No other needs identifies at this time. TOC will continue to follow for any further recommendations.  Expected Discharge Plan: Home/Self Care Barriers to Discharge: Continued Medical Work up   Patient Goals and CMS Choice Patient states their goals for this hospitalization and ongoing recovery are:: Wants to get better and be able to return to work. CMS Medicare.gov Compare Post Acute Care list provided to::  (patient refused home health) Choice offered to / list presented to : Patient  Expected Discharge Plan and Services Expected Discharge Plan: Home/Self Care In-house Referral: NA Discharge Planning Services: CM Consult Post Acute Care Choice: NA Living arrangements for the past 2 months: Single Family Home                 DME Arranged: Patient refused services         HH  Arranged: Patient Refused [place] Agency: NA        Prior Living Arrangements/Services Living arrangements for the past 2 months: Single Family Home Lives with:: Adult Children Patient language and need for interpreter reviewed:: Yes Do you feel safe going back to the place where you live?: Yes      Need for Family Participation in Patient Care: No (Comment) Care giver support system in place?: Yes (comment) Current home services:  (n/a) Criminal Activity/Legal Involvement Pertinent to Current Situation/Hospitalization: No - Comment as needed  Activities of Daily Living Home Assistive Devices/Equipment: Walker (specify type) (standard walker- no wheels) ADL Screening (condition at time of admission) Patient's cognitive ability adequate to safely complete daily activities?: Yes Is the patient deaf or have difficulty hearing?: No Does the patient have difficulty seeing, even when wearing glasses/contacts?: No Does the patient have difficulty concentrating, remembering, or making decisions?: No Patient able to express need for assistance with ADLs?: Yes Does the patient have difficulty dressing or bathing?: No Independently performs ADLs?: Yes (appropriate for developmental age) Does the patient have difficulty walking or climbing stairs?: No Weakness of Legs: Both Weakness of Arms/Hands: Both  Permission Sought/Granted Permission sought to share information with : Family Supports Permission granted to share information with : No              Emotional Assessment Appearance:: Appears stated age Attitude/Demeanor/Rapport: Gracious Affect (typically observed): Pleasant Orientation: : Oriented to Self, Oriented to Place,  Oriented to  Time, Oriented to Situation Alcohol / Substance Use: Not Applicable Psych Involvement: No (comment)  Admission diagnosis:  Anasarca [R60.1] Nausea and vomiting, unspecified vomiting type [R11.2] Patient Active Problem List   Diagnosis Date Noted    Lymphedema of left upper extremity 05/27/2022   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   Gallbladder disease 05/26/2022   Pericardial effusion 05/26/2022   Protein-calorie malnutrition, severe 05/25/2022   Bilateral pleural effusion 05/24/2022   Hypokalemia 05/23/2022   Transaminitis 05/23/2022   Rheumatoid arthritis ([place]) 04/27/2022   Abdominal pain 04/06/2022   Reactive thrombocytosis 04/06/2022   Leucocytosis 04/06/2022   Anasarca 04/06/2022   Type 2 diabetes mellitus with complication, without long-term current use of insulin ([place]) 04/06/2022   AKI (acute kidney injury) ([place]) 03/17/2022   Iron deficiency anemia 12/18/2019   Microcytic anemia 12/17/2019   Other fatigue    Deficiency anemia    HTN (hypertension) 10/18/2011   Tobacco user 10/18/2011   PCP:  Doretha Sou, MD Pharmacy:   [place], [place] [address] Phone: [phone] Fax: [phone]     Social Determinants of Health (SDOH) Interventions    Readmission Risk Interventions    05/27/2022   12:02 PM 04/11/2022   11:35 AM  Readmission Risk Prevention Plan  Transportation Screening Complete Complete  PCP or Specialist Appt within 5-7 Days  Complete  PCP or Specialist Appt within 3-5 Days Complete   Home Care Screening  Complete  Medication Review (RN CM)  Complete  HRI or Home Care Consult Complete   Social Work Consult for [place] Planning/Counseling Complete   Palliative Care Screening Not Applicable   Medication Review Press photographer) Referral to Pharmacy

## 2022-05-27 NOTE — Progress Notes (Signed)
Progress Note: General Surgery Service   Chief Complaint/Subjective: Still with some abdominal pain in the epigastrium and RUQ, but no n/v and tolerating her diet.  Objective: Vital signs in last 24 hours: Temp:  [97.9 F (36.6 C)-98.1 F (36.7 C)] 98.1 F (36.7 C) (11/03 0541) Pulse Rate:  [97-98] 97 (11/03 0541) Resp:  [17] 17 (11/03 0541) BP: (123-128)/(65-71) 123/65 (11/03 0541) SpO2:  [98 %-99 %] 99 % (11/03 0541) Last BM Date : 05/23/22  Intake/Output from previous day: 11/02 0701 - 11/03 0700 In: 709 [P.O.:709] Out: 300 [Urine:300] Intake/Output this shift: Total I/O In: 120 [P.O.:120] Out: -   Abd: soft, still mildly tender in epigastrium and RUQ, +BS, still with anasarca of abdominal wall Skin/Ext: diffuse anasarca still present   Lab Results: CBC  Recent Labs    05/26/22 0603 05/27/22 0718  WBC 6.0 5.8  HGB 12.7 11.1*  HCT 39.3 34.3*  PLT 491* 425*   BMET Recent Labs    05/26/22 0603 05/27/22 0718  NA 139 136  K 4.5 3.7  CL 101 97*  CO2 29 29  GLUCOSE 128* 131*  BUN 12 20  CREATININE 0.84 0.89  CALCIUM 8.9 8.3*   PT/INR No results for input(s): "LABPROT", "INR" in the last 72 hours.  ABG No results for input(s): "PHART", "HCO3" in the last 72 hours.  Invalid input(s): "PCO2", "PO2"   Anti-infectives: Anti-infectives (From admission, onward)    None       Medications: Scheduled Meds:  (feeding supplement) PROSource Plus  30 mL Oral TID BM   atorvastatin  40 mg Oral Daily   furosemide  80 mg Intravenous BID   gabapentin  300 mg Oral Daily   insulin aspart  0-5 Units Subcutaneous QHS   insulin aspart  0-9 Units Subcutaneous TID WC   methocarbamol  500 mg Oral TID   multivitamin with minerals  1 tablet Oral Daily   pantoprazole  40 mg Oral Daily   polyethylene glycol  17 g Oral Daily   potassium chloride  20 mEq Oral Daily   Ensure Max Protein  11 oz Oral BID   senna-docusate  1 tablet Oral BID   sodium chloride flush  3 mL  Intravenous Q12H   Continuous Infusions:  sodium chloride     PRN Meds:.sodium chloride, acetaminophen **OR** acetaminophen, ondansetron **OR** ondansetron (ZOFRAN) IV, oxyCODONE, sodium chloride flush  Assessment/Plan: Ms. Glaeser is a 61 year old female with an abnormal ejection fraction on HIDA scan.  She has abdominal pain and multiple medical issues, including this anasarca of unclear etiology  I do not think her gallbladder is her primary issue.  She appears to have a lymphatic issue causing swelling, chylous pleural effusion, and anasarca which may be due to poor lymphatic drainage.  Cardiology recommended a heme-onc consult, but heme-onc here did not feel they needed to see her acutely.  Labs and cytology from [facility]ora still pending.  She may benefit from cholecystectomy in the future, but these medical issues should be sorted out prior to surgery.  Once they are figured out, her symptoms may improve without surgery.   LOS: 4 days   Henreitta Cea, [facility] Use [email or web address] to contact on call provider

## 2022-05-27 NOTE — Plan of Care (Signed)

## 2022-05-28 ENCOUNTER — Inpatient Hospital Stay (HOSPITAL_COMMUNITY): Payer: BC Managed Care – PPO

## 2022-05-28 DIAGNOSIS — R601 Generalized edema: Secondary | ICD-10-CM | POA: Diagnosis not present

## 2022-05-28 LAB — COMPREHENSIVE METABOLIC PANEL
ALT: 1035 U/L — ABNORMAL HIGH (ref 0–44)
AST: 2146 U/L — ABNORMAL HIGH (ref 15–41)
Albumin: 3.4 g/dL — ABNORMAL LOW (ref 3.5–5.0)
Alkaline Phosphatase: 265 U/L — ABNORMAL HIGH (ref 38–126)
Anion gap: 12 (ref 5–15)
BUN: 23 mg/dL (ref 8–23)
CO2: 33 mmol/L — ABNORMAL HIGH (ref 22–32)
Calcium: 8.9 mg/dL (ref 8.9–10.3)
Chloride: 89 mmol/L — ABNORMAL LOW (ref 98–111)
Creatinine, Ser: 0.88 mg/dL (ref 0.44–1.00)
GFR, Estimated: >60 mL/min (ref >60)
Glucose, Bld: 135 mg/dL — ABNORMAL HIGH (ref 70–99)
Potassium: 3 mmol/L — ABNORMAL LOW (ref 3.5–5.1)
Sodium: 134 mmol/L — ABNORMAL LOW (ref 135–145)
Total Bilirubin: 0.5 mg/dL (ref 0.3–1.2)
Total Protein: 6.2 g/dL — ABNORMAL LOW (ref 6.5–8.1)

## 2022-05-28 LAB — HEPATIC FUNCTION PANEL
ALT: 833 U/L — ABNORMAL HIGH (ref 0–44)
AST: 1373 U/L — ABNORMAL HIGH (ref 15–41)
Albumin: 3.5 g/dL (ref 3.5–5.0)
Alkaline Phosphatase: 260 U/L — ABNORMAL HIGH (ref 38–126)
Bilirubin, Direct: 0.1 mg/dL (ref 0.0–0.2)
Indirect Bilirubin: 0.4 mg/dL (ref 0.3–0.9)
Total Bilirubin: 0.5 mg/dL (ref 0.3–1.2)
Total Protein: 6.2 g/dL — ABNORMAL LOW (ref 6.5–8.1)

## 2022-05-28 LAB — CBC WITH DIFFERENTIAL/PLATELET
Abs Immature Granulocytes: 0.02 10*3/uL (ref 0.00–0.07)
Basophils Absolute: 0 10*3/uL (ref 0.0–0.1)
Basophils Relative: 1 %
Eosinophils Absolute: 0.1 10*3/uL (ref 0.0–0.5)
Eosinophils Relative: 4 %
HCT: 37 % (ref 36.0–46.0)
Hemoglobin: 12.1 g/dL (ref 12.0–15.0)
Immature Granulocytes: 1 %
Lymphocytes Relative: 21 %
Lymphs Abs: 0.7 10*3/uL (ref 0.7–4.0)
MCH: 32 pg (ref 26.0–34.0)
MCHC: 32.7 g/dL (ref 30.0–36.0)
MCV: 97.9 fL (ref 80.0–100.0)
Monocytes Absolute: 0.3 10*3/uL (ref 0.1–1.0)
Monocytes Relative: 9 %
Neutro Abs: 2.2 10*3/uL (ref 1.7–7.7)
Neutrophils Relative %: 64 %
Platelets: 526 10*3/uL — ABNORMAL HIGH (ref 150–400)
RBC: 3.78 MIL/uL — ABNORMAL LOW (ref 3.87–5.11)
RDW: 14.2 % (ref 11.5–15.5)
WBC: 3.4 10*3/uL — ABNORMAL LOW (ref 4.0–10.5)
nRBC: 0 % (ref 0.0–0.2)

## 2022-05-28 LAB — HEPATITIS PANEL, ACUTE
HCV Ab: NONREACTIVE
Hep A IgM: NONREACTIVE
Hep B C IgM: NONREACTIVE
Hepatitis B Surface Ag: NONREACTIVE

## 2022-05-28 LAB — TSH: TSH: 1.27 u[IU]/mL (ref 0.350–4.500)

## 2022-05-28 LAB — GLUCOSE, CAPILLARY
Glucose-Capillary: 131 mg/dL — ABNORMAL HIGH (ref 70–99)
Glucose-Capillary: 188 mg/dL — ABNORMAL HIGH (ref 70–99)
Glucose-Capillary: 108 mg/dL — ABNORMAL HIGH (ref 70–99)
Glucose-Capillary: 157 mg/dL — ABNORMAL HIGH (ref 70–99)

## 2022-05-28 LAB — ETHANOL: Alcohol, Ethyl (B): <10 mg/dL (ref <10)

## 2022-05-28 LAB — BODY FLUID CULTURE W GRAM STAIN: Culture: NO GROWTH

## 2022-05-28 LAB — PROTIME-INR
INR: 1 (ref 0.8–1.2)
Prothrombin Time: 13.4 seconds (ref 11.4–15.2)

## 2022-05-28 LAB — ACETAMINOPHEN LEVEL: Acetaminophen (Tylenol), Serum: <10 ug/mL — ABNORMAL LOW (ref 10–30)

## 2022-05-28 LAB — CORTISOL: Cortisol, Plasma: 7.5 ug/dL

## 2022-05-28 LAB — MAGNESIUM: Magnesium: 1.8 mg/dL (ref 1.7–2.4)

## 2022-05-28 LAB — T4, FREE: Free T4: 0.93 ng/dL (ref 0.61–1.12)

## 2022-05-28 MED ORDER — COSYNTROPIN 0.25 MG IJ SOLR
0.2500 mg | Freq: Once | INTRAMUSCULAR | Status: AC
  Administered 2022-05-29: 0.25 mg via INTRAVENOUS
  Filled 2022-05-28: qty 0.25

## 2022-05-28 MED ORDER — ENOXAPARIN SODIUM 40 MG/0.4ML IJ SOSY
40.0000 mg | PREFILLED_SYRINGE | Freq: Every day | INTRAMUSCULAR | Status: DC
  Administered 2022-05-28 – 2022-06-08 (×12): 40 mg via SUBCUTANEOUS
  Filled 2022-05-28 (×12): qty 0.4

## 2022-05-28 MED ORDER — GABAPENTIN 300 MG PO CAPS
300.0000 mg | ORAL_CAPSULE | Freq: Two times a day (BID) | ORAL | Status: DC
  Administered 2022-05-28 – 2022-06-08 (×23): 300 mg via ORAL
  Filled 2022-05-28 (×23): qty 1

## 2022-05-28 MED ORDER — FUROSEMIDE 10 MG/ML IJ SOLN
80.0000 mg | Freq: Two times a day (BID) | INTRAMUSCULAR | Status: DC
  Administered 2022-05-28 – 2022-05-30 (×4): 80 mg via INTRAVENOUS
  Filled 2022-05-28 (×4): qty 8

## 2022-05-28 NOTE — Plan of Care (Signed)
  Problem: Fluid Volume: Goal: Ability to maintain a balanced intake and output will improve Outcome: Progressing   Problem: Skin Integrity: Goal: Risk for impaired skin integrity will decrease Outcome: Progressing   Problem: Education: Goal: Knowledge of General Education information will improve Description: Including pain rating scale, medication(s)/side effects and non-pharmacologic comfort measures Outcome: Progressing   Problem: Health Behavior/Discharge Planning: Goal: Ability to manage health-related needs will improve Outcome: Progressing   Problem: Clinical Measurements: Goal: Ability to maintain clinical measurements within normal limits will improve Outcome: Progressing Goal: Will remain free from infection Outcome: Progressing Goal: Diagnostic test results will improve Outcome: Progressing Goal: Respiratory complications will improve Outcome: Progressing   Problem: Activity: Goal: Risk for activity intolerance will decrease Outcome: Progressing   Problem: Coping: Goal: Level of anxiety will decrease Outcome: Progressing   Problem: Elimination: Goal: Will not experience complications related to bowel motility Outcome: Progressing Goal: Will not experience complications related to urinary retention Outcome: Progressing   Problem: Nutritional: Goal: Maintenance of adequate nutrition will improve Outcome: Not Progressing Goal: Progress toward achieving an optimal weight will improve Outcome: Not Progressing   Problem: Nutrition: Goal: Adequate nutrition will be maintained Outcome: Not Progressing

## 2022-05-28 NOTE — Plan of Care (Signed)
Pt continues to complain of moderate to severe pain in abdomen and BLLE.  Problem: Nutritional: Goal: Maintenance of adequate nutrition will improve 05/28/2022 1121 by Hewitt Blade, Kynnedi Zweig D, RN Outcome: Not Progressing 05/28/2022 1120 by Hewitt Blade, Lubertha Leite D, RN Outcome: Not Progressing   Problem: Nutrition: Goal: Adequate nutrition will be maintained 05/28/2022 1121 by Hewitt Blade, Mailyn Steichen D, RN Outcome: Not Progressing 05/28/2022 1120 by Hewitt Blade, Teron Blais D, RN Outcome: Not Progressing   Problem: Pain Managment: Goal: General experience of comfort will improve 05/28/2022 1121 by Hewitt Blade, Everton Bertha D, RN Outcome: Not Progressing 05/28/2022 1120 by Hewitt Blade, Stephanye Finnicum D, RN Outcome: Not Progressing   Problem: Education: Goal: Ability to describe self-care measures that may prevent or decrease complications (Diabetes Survival Skills Education) will improve Outcome: Progressing Goal: Individualized Educational Video(s) Outcome: Progressing   Problem: Coping: Goal: Ability to adjust to condition or change in health will improve Outcome: Progressing   Problem: Fluid Volume: Goal: Ability to maintain a balanced intake and output will improve Outcome: Progressing   Problem: Health Behavior/Discharge Planning: Goal: Ability to identify and utilize available resources and services will improve Outcome: Progressing Goal: Ability to manage health-related needs will improve Outcome: Progressing   Problem: Metabolic: Goal: Ability to maintain appropriate glucose levels will improve Outcome: Progressing   Problem: Nutritional: Goal: Progress toward achieving an optimal weight will improve Outcome: Progressing   Problem: Skin Integrity: Goal: Risk for impaired skin integrity will decrease Outcome: Progressing   Problem: Tissue Perfusion: Goal: Adequacy of tissue perfusion will improve Outcome: Progressing   Problem: Education: Goal: Knowledge  of General Education information will improve Description: Including pain rating scale, medication(s)/side effects and non-pharmacologic comfort measures Outcome: Progressing   Problem: Health Behavior/Discharge Planning: Goal: Ability to manage health-related needs will improve Outcome: Progressing   Problem: Clinical Measurements: Goal: Ability to maintain clinical measurements within normal limits will improve Outcome: Progressing Goal: Will remain free from infection Outcome: Progressing Goal: Diagnostic test results will improve Outcome: Progressing Goal: Respiratory complications will improve Outcome: Progressing Goal: Cardiovascular complication will be avoided Outcome: Progressing   Problem: Activity: Goal: Risk for activity intolerance will decrease Outcome: Progressing   Problem: Coping: Goal: Level of anxiety will decrease Outcome: Progressing   Problem: Elimination: Goal: Will not experience complications related to bowel motility Outcome: Progressing Goal: Will not experience complications related to urinary retention Outcome: Progressing   Problem: Safety: Goal: Ability to remain free from injury will improve Outcome: Progressing   Problem: Skin Integrity: Goal: Risk for impaired skin integrity will decrease Outcome: Progressing

## 2022-05-28 NOTE — Progress Notes (Signed)
Spoke to phlebotomist to coordinate time of lab draw for tomorrow mornings medication of cosyntropin  per order. I was told that the [place] lab draw can only be done on Monday mornings at 0800.

## 2022-05-28 NOTE — Plan of Care (Signed)
  Problem: Fluid Volume: Goal: Ability to maintain a balanced intake and output will improve Outcome: Progressing   Problem: Skin Integrity: Goal: Risk for impaired skin integrity will decrease Outcome: Progressing   Problem: Nutrition: Goal: Adequate nutrition will be maintained Outcome: Progressing

## 2022-05-28 NOTE — Progress Notes (Signed)
[facility] Progress Note Patient: Alyssa Rhodes QQV:[id number] DOB: 02-24-1961 DOA: 05/23/2022  DOS: the patient was seen and examined on 05/28/2022  Brief hospital course: Ms. Alyssa Rhodes is a 61 yo female with PMH RA (previously on MTX, just taken off per rheumatology on 10/24), DMII, HLD, HTN who presented with recurrent right-sided abdominal pain.  Has history of rheumatoid arthritis and follows up with Dr. Amil Rhodes.   Recently had work-up with surgery including multiple imaging studies and HIDA scan performed 04/11/2022.  EF 18% on HIDA scan with patent cystic duct and CBD. She has also been having extensive generalized edema for several months.  Echo was performed 03/15/2022 which showed normal EF, 60 to 65%, no RWMA, normal diastolic parameters.  Small pericardial effusion. She has undergone multiple duplex studies.  All negative for DVT. She was seen by cardiology, on 04/27/2022.  They felt her edema is noncardiac in etiology and recommended for referral to heme-onc to consider bone marrow biopsy. Currently being aggressively diuresed with IV Lasix.  11/4 LFT elevation in thousands, now improving. Assessment and Plan: Acute transaminitis. Etiology not clear. Patient is hospitalized for 5 days thus less likely Tylenol ingestion or alcohol ingestion. Acute hepatitis is also less likely. Most likely this is acute idiosyncratic reaction to Zaroxolyn or Flexeril. LFT already trending down from 2000-1004 AST and from 1000-804 ALT. Bilirubin level normal. INR level normal. Glucose level normal. Tylenol level undetectable. Alcohol negative. We will check ultrasound abdomen and discuss with GI tomorrow. Avoid hepatotoxic medication including Lipitor and Tylenol.  Acute on chronic HFpEF. Continue with IV Lasix. We will reduce the frequency from 3 times daily to twice daily. Responding well with significant improvement in edema and weight with stable serum creatinine Filed Weights   05/24/22  1434 05/26/22 0631 05/28/22 0933  Weight: 67 kg 67.2 kg 61 kg  While she responded well to Zaroxolyn given severe idiosyncratic reaction I am going to hold this medication for now.  Chronic pain. Increase gabapentin to 300 mg twice daily.  Low cortisol level. We will check cosyntropin stimulation test tomorrow.  Abnormal TSH. TSH is undetectably low. Free T4 is normal. Etiology of this finding is not clear as the patient clearly does not appear to have any severe symptoms of hyperthyroidism and has edema which could have been consistent with hypothyroidism. For now we will monitor and recheck value in 2 weeks.  Severe protein calorie malnutrition. We will check vitamin B1 and vitamin B12 levels as well. Continue supplements.  Hypokalemia. Currently being replaced. Hyponatremia. Clinically insignificant.  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  Feeling actually better.  Abdominal pain about the same.  Passing gas.  No nausea or vomiting.  Physical Exam: General: in mild distress;  Cardiovascular: S1 and S2 Present, no Murmur Respiratory: Good respiratory effort, Bilateral Air entry present, faint basal crackles, no wheezes Abdomen: Bowel Sound present, diffusely tender in the upper abdominal area unchanged from prior Extremities: Improving edema in left upper extremity.  Skin wrinkle can be seen.  Improving edema in bilateral lower extremity. Neurology: alert and oriented to time,  place, and person   Data Reviewed: I have Reviewed nursing notes, Vitals, and Lab  results. Since last encounter, pertinent lab results CBC and BMP   . I have ordered test including CBC, CMP, cortisol level, ACTH, INR, Tylenol level, alcohol level, B1 and B12  . I have ordered imaging ultrasound liver  .   Disposition: Status is: Inpatient Remains inpatient appropriate because: Need further work-up for anasarca and LFT elevation  enoxaparin (LOVENOX) injection 40 mg Start: 05/28/22 1100 SCDs Start: 05/24/22 0055   Family Communication: No one at bedside Level of care: Telemetry Continue telemetry due to CHF  Vitals:   05/27/22 1959 05/28/22 0408 05/28/22 0933 05/28/22 1421  BP: 130/71 127/74  124/74  Pulse: 90 100  94  Resp: _0 Temp: 98 F (36.7 C) 98.4 F (36.9 C)  98.4 F (36.9 C)  TempSrc: Oral Oral  Oral  SpO2: 100% 99%  97%  Weight:   61 kg   Height:         Author: Berle Mull, MD 05/28/2022 5:34 PM  Please look on [email or web address] to find out who is on call.

## 2022-05-29 DIAGNOSIS — R601 Generalized edema: Secondary | ICD-10-CM | POA: Diagnosis not present

## 2022-05-29 LAB — CBC WITH DIFFERENTIAL/PLATELET
Abs Immature Granulocytes: 0.02 10*3/uL (ref 0.00–0.07)
Basophils Absolute: 0 10*3/uL (ref 0.0–0.1)
Basophils Relative: 0 %
Eosinophils Absolute: 0.2 10*3/uL (ref 0.0–0.5)
Eosinophils Relative: 5 %
HCT: 37.7 % (ref 36.0–46.0)
Hemoglobin: 12.2 g/dL (ref 12.0–15.0)
Immature Granulocytes: 0 %
Lymphocytes Relative: 18 %
Lymphs Abs: 0.9 10*3/uL (ref 0.7–4.0)
MCH: 31.9 pg (ref 26.0–34.0)
MCHC: 32.4 g/dL (ref 30.0–36.0)
MCV: 98.7 fL (ref 80.0–100.0)
Monocytes Absolute: 0.4 10*3/uL (ref 0.1–1.0)
Monocytes Relative: 7 %
Neutro Abs: 3.5 10*3/uL (ref 1.7–7.7)
Neutrophils Relative %: 70 %
Platelets: 592 10*3/uL — ABNORMAL HIGH (ref 150–400)
RBC: 3.82 MIL/uL — ABNORMAL LOW (ref 3.87–5.11)
RDW: 14.1 % (ref 11.5–15.5)
WBC: 5 10*3/uL (ref 4.0–10.5)
nRBC: 0 % (ref 0.0–0.2)

## 2022-05-29 LAB — COMPREHENSIVE METABOLIC PANEL
ALT: 512 U/L — ABNORMAL HIGH (ref 0–44)
AST: 476 U/L — ABNORMAL HIGH (ref 15–41)
Albumin: 3.5 g/dL (ref 3.5–5.0)
Alkaline Phosphatase: 204 U/L — ABNORMAL HIGH (ref 38–126)
Anion gap: 10 (ref 5–15)
BUN: 29 mg/dL — ABNORMAL HIGH (ref 8–23)
CO2: 36 mmol/L — ABNORMAL HIGH (ref 22–32)
Calcium: 8.9 mg/dL (ref 8.9–10.3)
Chloride: 87 mmol/L — ABNORMAL LOW (ref 98–111)
Creatinine, Ser: 0.8 mg/dL (ref 0.44–1.00)
GFR, Estimated: >60 mL/min (ref >60)
Glucose, Bld: 161 mg/dL — ABNORMAL HIGH (ref 70–99)
Potassium: 2.7 mmol/L — CL (ref 3.5–5.1)
Sodium: 133 mmol/L — ABNORMAL LOW (ref 135–145)
Total Bilirubin: 0.3 mg/dL (ref 0.3–1.2)
Total Protein: 6.8 g/dL (ref 6.5–8.1)

## 2022-05-29 LAB — BASIC METABOLIC PANEL
Anion gap: 12 (ref 5–15)
BUN: 33 mg/dL — ABNORMAL HIGH (ref 8–23)
CO2: 34 mmol/L — ABNORMAL HIGH (ref 22–32)
Calcium: 9.3 mg/dL (ref 8.9–10.3)
Chloride: 89 mmol/L — ABNORMAL LOW (ref 98–111)
Creatinine, Ser: 1.13 mg/dL — ABNORMAL HIGH (ref 0.44–1.00)
GFR, Estimated: 55 mL/min — ABNORMAL LOW (ref >60)
Glucose, Bld: 211 mg/dL — ABNORMAL HIGH (ref 70–99)
Potassium: 3.4 mmol/L — ABNORMAL LOW (ref 3.5–5.1)
Sodium: 135 mmol/L (ref 135–145)

## 2022-05-29 LAB — GLUCOSE, CAPILLARY
Glucose-Capillary: 142 mg/dL — ABNORMAL HIGH (ref 70–99)
Glucose-Capillary: 187 mg/dL — ABNORMAL HIGH (ref 70–99)
Glucose-Capillary: 267 mg/dL — ABNORMAL HIGH (ref 70–99)
Glucose-Capillary: 129 mg/dL — ABNORMAL HIGH (ref 70–99)
Glucose-Capillary: 163 mg/dL — ABNORMAL HIGH (ref 70–99)

## 2022-05-29 LAB — PROTIME-INR
INR: 1 (ref 0.8–1.2)
Prothrombin Time: 13 seconds (ref 11.4–15.2)

## 2022-05-29 LAB — ACTH STIMULATION, 3 TIME POINTS
Cortisol, 30 Min: 22.7 ug/dL
Cortisol, 60 Min: 28.2 ug/dL
Cortisol, Base: 6.9 ug/dL

## 2022-05-29 LAB — MAGNESIUM: Magnesium: 1.9 mg/dL (ref 1.7–2.4)

## 2022-05-29 MED ORDER — POTASSIUM CHLORIDE CRYS ER 20 MEQ PO TBCR
40.0000 meq | EXTENDED_RELEASE_TABLET | ORAL | Status: AC
  Administered 2022-05-29 (×2): 40 meq via ORAL
  Filled 2022-05-29 (×2): qty 2

## 2022-05-29 NOTE — Progress Notes (Signed)
Mobility Specialist - Progress Note   05/29/22 1303  Therapy Vitals  Temp 98.5 F (36.9 C)  Temp Source Oral  Pulse Rate 95  Resp 17  BP 126/82  Patient Position (if appropriate) Sitting  Oxygen Therapy  SpO2 92 %  O2 Device Nasal Cannula  Mobility  Activity Ambulated with assistance in hallway  Level of Assistance Standby assist, set-up cues, supervision of patient - no hands on  Assistive Device Front wheel walker  Distance Ambulated (ft) 150 ft  Activity Response Tolerated well  Mobility Referral Yes  $Mobility charge 1 Mobility   Pt received in bed and agreed to mobility, no c/o pain nor discomfort, just fatigue. Pt back to chair with all needs met  [place] Specialist

## 2022-05-29 NOTE — Plan of Care (Signed)
  Problem: Fluid Volume: Goal: Ability to maintain a balanced intake and output will improve Outcome: Progressing   Problem: Skin Integrity: Goal: Risk for impaired skin integrity will decrease Outcome: Progressing   Problem: Safety: Goal: Ability to remain free from injury will improve Outcome: Progressing

## 2022-05-29 NOTE — Progress Notes (Signed)
[facility] Progress Note Patient: KARSTEN VAUGHN YTK:[id number] DOB: 1961-05-13 DOA: 05/23/2022  DOS: the patient was seen and examined on 05/29/2022  Brief hospital course: Ms. Noell is a 61 yo female with PMH RA (previously on MTX, just taken off per rheumatology on 10/24), DMII, HLD, HTN who presented with recurrent right-sided abdominal pain.  Has history of rheumatoid arthritis and follows up with Dr. Amil Amen.   Recently had work-up with surgery including multiple imaging studies and HIDA scan performed 04/11/2022.  EF 18% on HIDA scan with patent cystic duct and CBD. She has also been having extensive generalized edema for several months.  Echo was performed 03/15/2022 which showed normal EF, 60 to 65%, no RWMA, normal diastolic parameters.  Small pericardial effusion. She has undergone multiple duplex studies.  All negative for DVT. She was seen by cardiology, on 04/27/2022.  They felt her edema is noncardiac in etiology and recommended for referral to heme-onc to consider bone marrow biopsy. Currently being aggressively diuresed with IV Lasix.  11/4 LFT elevation in thousands, now improving. 11/5 liver continues to improve.  Per discussion with GI this appears to be ischemic injury rather than reducing reticulocyte injury.  Assessment and Plan: Acute transaminitis. Most likely versus ischemic injury per GI discussion. Tylenol level normal.  Alcohol level normal. Hepatitis level negative. Ultrasound shows possible biliary sludge.  Liver Doppler negative for any thrombosis. Less likely due to critical reaction. Per [facility] this is mostly ischemic reaction as bilirubin level is normal, and LFTs are improving rapidly. Over hepatotoxic medication.  Acute on chronic HFpEF. Continue with IV Lasix 80 mg twice daily. TED stockings. Patient has dependent edema on her left elbow while there is no edema on humeral area or around the wrist area.  Use sleeve. Filed Weights   05/26/22 0631  05/28/22 0933 05/29/22 0619  Weight: 67.2 kg 61 kg 59.9 kg    Chronic pain. Gabapentin 3 mg twice daily but  Lower cortisol level. Cosyntropin stimulation test is actually normal.  Severe protein calorie malnutrition but Check B12 and B12 tomorrow. Continue supplements per  Severe hypokalemia. Potassium level 2.7 Replacing aggressively and recheck later in the evening. Magnesium level adequate.  Recurrent right pleural effusion Had thoracentesis earlier.  Fluid showed chylothorax Appears to have recurrence of the fluid based on the ultrasound. We will perform chest x-ray for further evaluation.  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  No hematemesis.  Tolerating oral diet.  Having BM. Abdominal pain unchanged. Physical Exam: General: in moderate distress;  Cardiovascular: S1 and S2 Present, no Murmur Respiratory: Normal respiratory effort, Bilateral Air entry present, no Crackles, no wheezes Abdomen: Bowel Sound present, diffusely tender Extremities: Bilateral lower extremity edema Neurology: alert and oriented to time, place, and person   Data Reviewed: I have Reviewed nursing notes, Vitals, and Lab results. Since last encounter, pertinent lab results CBC and CMP and INR   . I have ordered test including CBC CMP INR  . I have discussed pt's care plan and test results with GI  .   Disposition: Status is:  Inpatient Remains inpatient appropriate because: Need for IV diuresis and further work-up  Place TED hose Start: 05/29/22 1553 enoxaparin (LOVENOX)  injection 40 mg Start: 05/28/22 1100 SCDs Start: 05/24/22 0055   Family Communication: No one at bedside Level of care: Telemetry continue telemetry for CHF  Vitals:   05/28/22 2215 05/29/22 0551 05/29/22 0619 05/29/22 1303  BP: 120/76 133/83  126/82  Pulse: 95 87  95  Resp: _0 Temp: 98.5 F (36.9 C) 98.3 F (36.8 C)  98.5 F (36.9 C)  TempSrc: Oral Oral  Oral  SpO2: 98% 97%  92%  Weight:   59.9 kg   Height:         Author: Berle Mull, MD 05/29/2022 6:53 PM  Please look on [email or web address] to find out who is on call.

## 2022-05-30 ENCOUNTER — Inpatient Hospital Stay (HOSPITAL_COMMUNITY): Payer: BC Managed Care – PPO

## 2022-05-30 DIAGNOSIS — R601 Generalized edema: Secondary | ICD-10-CM | POA: Diagnosis not present

## 2022-05-30 LAB — COMPREHENSIVE METABOLIC PANEL
ALT: 267 U/L — ABNORMAL HIGH (ref 0–44)
AST: 171 U/L — ABNORMAL HIGH (ref 15–41)
Albumin: 3.4 g/dL — ABNORMAL LOW (ref 3.5–5.0)
Alkaline Phosphatase: 141 U/L — ABNORMAL HIGH (ref 38–126)
Anion gap: 12 (ref 5–15)
BUN: 41 mg/dL — ABNORMAL HIGH (ref 8–23)
CO2: 35 mmol/L — ABNORMAL HIGH (ref 22–32)
Calcium: 8.9 mg/dL (ref 8.9–10.3)
Chloride: 89 mmol/L — ABNORMAL LOW (ref 98–111)
Creatinine, Ser: 1.15 mg/dL — ABNORMAL HIGH (ref 0.44–1.00)
GFR, Estimated: 54 mL/min — ABNORMAL LOW (ref >60)
Glucose, Bld: 137 mg/dL — ABNORMAL HIGH (ref 70–99)
Potassium: 3 mmol/L — ABNORMAL LOW (ref 3.5–5.1)
Sodium: 136 mmol/L (ref 135–145)
Total Bilirubin: 0.5 mg/dL (ref 0.3–1.2)
Total Protein: 6.2 g/dL — ABNORMAL LOW (ref 6.5–8.1)

## 2022-05-30 LAB — GLUCOSE, CAPILLARY
Glucose-Capillary: 131 mg/dL — ABNORMAL HIGH (ref 70–99)
Glucose-Capillary: 175 mg/dL — ABNORMAL HIGH (ref 70–99)
Glucose-Capillary: 170 mg/dL — ABNORMAL HIGH (ref 70–99)
Glucose-Capillary: 159 mg/dL — ABNORMAL HIGH (ref 70–99)

## 2022-05-30 LAB — VITAMIN B12: Vitamin B-12: 1158 pg/mL — ABNORMAL HIGH (ref 180–914)

## 2022-05-30 LAB — MAGNESIUM: Magnesium: 1.8 mg/dL (ref 1.7–2.4)

## 2022-05-30 MED ORDER — POTASSIUM CHLORIDE 20 MEQ PO PACK: 40.0000 meq | PACK | Freq: Once | ORAL | Status: DC

## 2022-05-30 MED ORDER — POTASSIUM CHLORIDE 20 MEQ PO PACK
20.0000 meq | PACK | Freq: Once | ORAL | Status: AC
  Administered 2022-05-30: 20 meq via ORAL
  Filled 2022-05-30: qty 1

## 2022-05-30 MED ORDER — POTASSIUM CHLORIDE CRYS ER 20 MEQ PO TBCR
40.0000 meq | EXTENDED_RELEASE_TABLET | ORAL | Status: DC
  Administered 2022-05-30: 40 meq via ORAL
  Filled 2022-05-30: qty 2

## 2022-05-30 MED ORDER — POTASSIUM CHLORIDE 20 MEQ PO PACK: 60.0000 meq | PACK | Freq: Once | ORAL | Status: DC

## 2022-05-30 MED ORDER — ALBUMIN HUMAN 25 % IV SOLN
12.5000 g | Freq: Four times a day (QID) | INTRAVENOUS | Status: AC
  Administered 2022-05-30 (×2): 12.5 g via INTRAVENOUS
  Filled 2022-05-30 (×2): qty 50

## 2022-05-30 NOTE — Progress Notes (Signed)
[facility] Progress Note Patient: Alyssa Rhodes KHT:[id number] DOB: 27-Mar-1961 DOA: 05/23/2022  DOS: the patient was seen and examined on 05/30/2022  Brief hospital course: Ms. Voorheis is a 61 yo female with PMH RA (previously on MTX, just taken off per rheumatology on 10/24), DMII, HLD, HTN who presented with recurrent right-sided abdominal pain.  Has history of rheumatoid arthritis and follows up with Dr. Amil Amen.   Recently had work-up with surgery including multiple imaging studies and HIDA scan performed 04/11/2022.  EF 18% on HIDA scan with patent cystic duct and CBD. She has also been having extensive generalized edema for several months.  Echo was performed 03/15/2022 which showed normal EF, 60 to 65%, no RWMA, normal diastolic parameters.  Small pericardial effusion. She has undergone multiple duplex studies.  All negative for DVT. She was seen by cardiology, on 04/27/2022.  They felt her edema is noncardiac in etiology and recommended for referral to heme-onc to consider bone marrow biopsy. Currently being aggressively diuresed with IV Lasix.  11/4 LFT elevation in thousands, now improving. 11/5 liver continues to improve.  Per discussion with GI this appears to be ischemic injury rather than reducing reticulocyte injury.  Assessment and Plan: Acute on chronic diastolic CHF. Continue with IV Lasix x1 today. Continue TED stockings. Discussed with cardiology, recommend no need for further intervention including right heart cath right now although close outpatient follow-up.  Acute transaminitis. Most likely ischemic injury/shock liver. Improving. Monitor for  Chronic pain. Continue gabapentin.  Acute kidney injury. Renal function worsening. Likely in the setting of diuresis. We will provide IV albumin. Monitor.  Hypokalemia. We will replace.  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  Physical Exam: General: in mild distress;  Cardiovascular: S1 and S2 Present, no Murmur Respiratory: noral respiratory effort, Bilateral Air entry present, no Crackles, no wheezes Abdomen: Bowel Sound present, Non tender  Extremities: no Neurology: alert and oriented to time, place, and person   Data Reviewed: I have Reviewed nursing notes, Vitals, and Lab results. Since last encounter, pertinent lab results CBC and CMP   . I have ordered test including CBC and CMP  . I have discussed pt's care plan and test results with cardiology  .   Disposition: Status is: Inpatient Remains inpatient appropriate because: AKI requiring close observation.  Place TED hose Start: 05/29/22 1553 enoxaparin (LOVENOX) injection 40 mg Start: 05/28/22 1100 SCDs Start: 05/24/22 0055   Family Communication: No one at bedside Level of care: Telemetry continue telemetry due to CHF. Vitals:   05/29/22 2234 05/30/22 0550 05/30/22 0603 05/30/22 1233  BP: 125/71 123/80  126/75  Pulse: 90 83  85  Resp: _0 Temp: 98.1 F (36.7 C) 98.1 F (36.7 C)  97.9 F (36.6 C)  TempSrc: Oral Oral  Oral  SpO2: 100% 98%  97%  Weight:   60.3 kg   Height:         Author: Berle Mull, MD 05/30/2022 7:03 PM  Please look on [email or web address] to find out who is on call.

## 2022-05-30 NOTE — Progress Notes (Addendum)
Physical Therapy Treatment Patient Details Name: Alyssa Rhodes MRN: [id number] DOB: 11-24-1960 Today's Date: 05/30/2022   History of Present Illness Alyssa Rhodes is a 61 yo female with PMH RA (previously on MTX, just taken off per rheumatology on 10/24), DMII, HLD, HTN who presented with recurrent right-sided abdominal pain. Of note, she says she does not have Lupus despite showing on her PMH.   This has been an ongoing complaint for several months.  She has gone recent work-up with surgery including multiple imaging studies and HIDA scan performed 04/11/2022.  EF 18% on HIDA scan with patent cystic duct and CBD.     She has also been having extensive generalized edema for several months.  She was evaluated outpatient by general surgery regarding possible cholecystectomy but due to her volume overload, was considered high risk for surgery until edema improved and etiology found.  Echo was performed 03/15/2022 which showed normal EF, 60 to 65%,   Small pericardial effusion.  Dopplers negative for DVT.   She is admitted for her recurrent abdominal pain as noted and further work-up of possible regarding her ongoing edema.      Alyssa Rhodes Comments    Alyssa Rhodes is progressing this session. Incr gait distance/tolerance with SpO2= 89-95% on RA. Replaced O2 at 1L at rest  as she had one brief decr to 87%. Continue Alyssa Rhodes in acute setting. Alyssa Rhodes is motivated to work with Alyssa Rhodes, wants to get back to work, consider [facility] if Alyssa Rhodes agreeable   Recommendations for follow up therapy are one component of a multi-disciplinary discharge planning process, led by the attending physician.  Recommendations may be updated based on patient status, additional functional criteria and insurance authorization.  Follow Up Recommendations  Home health Alyssa Rhodes     Assistance Recommended at Discharge Intermittent Supervision/Assistance  Patient can return home with the following A little help with walking and/or transfers;A little help with  bathing/dressing/bathroom;Assistance with cooking/housework;Assist for transportation;Help with stairs or ramp for entrance   Equipment Recommendations  None recommended by Alyssa Rhodes    Recommendations for Other Services       Precautions / Restrictions Precautions Precautions: Fall Precaution Comments: monitor sats Restrictions Weight Bearing Restrictions: No     Mobility  Bed Mobility   Bed Mobility: Sit to Supine     Supine to sit: Supervision, Modified independent (Device/Increase time) Sit to supine: Modified independent (Device/Increase time)   General bed mobility comments: incr time, no physical assist    Transfers Overall transfer level: Needs assistance Equipment used: None Transfers: Sit to/from Stand Sit to Stand: Supervision   Step pivot transfers: Supervision, Min guard       General transfer comment: for safety, incr time, wide BOS    Ambulation/Gait Ambulation/Gait assistance: Supervision Gait Distance (Feet): 160 Feet Assistive device: None Gait Pattern/deviations: Step-through pattern, Decreased stride length, Wide base of support Gait velocity: decreased     General Gait Details: slower, guarded gait, occasional rail or wall touch to steady self. no overt LOB. SpO2=89-95% on RA   Stairs             Wheelchair Mobility    Modified Rankin (Stroke Patients Only)       Balance   Sitting-balance support: Feet supported Sitting balance-Leahy Scale: Good     Standing balance support: No upper extremity supported, During functional activity Standing balance-Leahy Scale: Fair  Cognition Arousal/Alertness: Awake/alert Behavior During Therapy: WFL for tasks assessed/performed Overall Cognitive Status: Within Functional Limits for tasks assessed                                          Exercises      General Comments        Pertinent Vitals/Pain Pain Assessment Pain  Assessment: No/denies pain    Home Living                          Prior Function            Alyssa Rhodes Goals (current goals can now be found in the care plan section) Acute Rehab Alyssa Rhodes Goals Patient Stated Goal: legs to stop hurting/swelling to go down Alyssa Rhodes Goal Formulation: With patient Time For Goal Achievement: 06/08/22 Potential to Achieve Goals: Good Progress towards Alyssa Rhodes goals: Progressing toward goals    Frequency    Min 3X/week      Alyssa Rhodes Plan Current plan remains appropriate    Co-evaluation              AM-PAC Alyssa Rhodes "6 Clicks" Mobility   Outcome Measure  Help needed turning from your back to your side while in a flat bed without using bedrails?: None Help needed moving from lying on your back to sitting on the side of a flat bed without using bedrails?: A Little Help needed moving to and from a bed to a chair (including a wheelchair)?: A Little Help needed standing up from a chair using your arms (e.g., wheelchair or bedside chair)?: A Little Help needed to walk in hospital room?: A Little Help needed climbing 3-5 steps with a railing? : A Little 6 Click Score: 19    End of Session Equipment Utilized During Treatment: Gait belt Activity Tolerance: Patient tolerated treatment well Patient left: with call bell/phone within reach;in bed (no alarm on arrival)   Alyssa Rhodes Visit Diagnosis: Muscle weakness (generalized) (M62.81);Difficulty in walking, not elsewhere classified (R26.2);Unsteadiness on feet (R26.81)     Time:    Baxter Flattery, Alyssa Rhodes  Acute Rehab Dept [facility]) [phone]  [facility] Weekend Pager [facility] only)  [phone]  05/30/2022    [facility] 05/30/2022, 12:06 PM

## 2022-05-30 NOTE — Progress Notes (Signed)
Patient with biliary dyskinesia on outpatient HIDA with patent cystic duct and CBD. She is tolerating PO diet without nausea/vomiting and do not think acute treatment with laparoscopic cholecystectomy is indicated at this time especially in light of acute medical problems. Transaminitis improving. Follow up outpatient as previously planned.

## 2022-05-30 NOTE — Progress Notes (Signed)
Inpatient Diabetes Program Recommendations  AACE/ADA: New Consensus Statement on Inpatient Glycemic Control (2015)  Target Ranges:  Prepandial:   less than 140 mg/dL      Peak postprandial:   less than 180 mg/dL (1-2 hours)      Critically ill patients:  140 - 180 mg/dL   Lab Results  Component Value Date   GLUCAP 131 (H) 05/30/2022   HGBA1C 7.5 (H) 03/18/2022    Review of Glycemic Control  Latest Reference Range & Units 05/29/22 07:29 05/29/22 11:21 05/29/22 16:41 05/29/22 21:46 05/29/22 22:37 05/30/22 07:23  Glucose-Capillary 70 - 99 mg/dL 142 (H) 187 (H) 267 (H) 129 (H) 163 (H) 131 (H)   Diabetes history: DM  Outpatient Diabetes medications:  Metformin 500 mg daily (patient not taking) Current orders for Inpatient glycemic control:  Novolog sensitive tid with meals and HS  Inpatient Diabetes Program Recommendations:    Note post prandial blood sugars increased on 11/5.  Consider adding Novolog 2 units tid with meals (hold if patient eats less than 50% or NPO).   Thanks,  Adah Perl, RN, BC-ADM Inpatient Diabetes Coordinator Pager [phone]  (8a-5p)

## 2022-05-31 ENCOUNTER — Inpatient Hospital Stay (HOSPITAL_COMMUNITY): Payer: BC Managed Care – PPO

## 2022-05-31 DIAGNOSIS — R601 Generalized edema: Secondary | ICD-10-CM | POA: Diagnosis not present

## 2022-05-31 LAB — COMPREHENSIVE METABOLIC PANEL WITH GFR
ALT: 136 U/L — ABNORMAL HIGH (ref 0–44)
AST: 66 U/L — ABNORMAL HIGH (ref 15–41)
Albumin: 3.4 g/dL — ABNORMAL LOW (ref 3.5–5.0)
Alkaline Phosphatase: 101 U/L (ref 38–126)
Anion gap: 15 (ref 5–15)
BUN: 33 mg/dL — ABNORMAL HIGH (ref 8–23)
CO2: 34 mmol/L — ABNORMAL HIGH (ref 22–32)
Calcium: 9.7 mg/dL (ref 8.9–10.3)
Chloride: 92 mmol/L — ABNORMAL LOW (ref 98–111)
Creatinine, Ser: 0.93 mg/dL (ref 0.44–1.00)
GFR, Estimated: >60 mL/min
Glucose, Bld: 175 mg/dL — ABNORMAL HIGH (ref 70–99)
Potassium: 3.9 mmol/L (ref 3.5–5.1)
Sodium: 141 mmol/L (ref 135–145)
Total Bilirubin: 0.4 mg/dL (ref 0.3–1.2)
Total Protein: 6 g/dL — ABNORMAL LOW (ref 6.5–8.1)

## 2022-05-31 LAB — GLUCOSE, CAPILLARY
Glucose-Capillary: 130 mg/dL — ABNORMAL HIGH (ref 70–99)
Glucose-Capillary: 180 mg/dL — ABNORMAL HIGH (ref 70–99)
Glucose-Capillary: 163 mg/dL — ABNORMAL HIGH (ref 70–99)
Glucose-Capillary: 161 mg/dL — ABNORMAL HIGH (ref 70–99)

## 2022-05-31 LAB — ACTH: C206 ACTH: 3 pg/mL — ABNORMAL LOW (ref 7.2–63.3)

## 2022-05-31 LAB — MAGNESIUM: Magnesium: 1.9 mg/dL (ref 1.7–2.4)

## 2022-05-31 MED ORDER — HYDROCHLOROTHIAZIDE 25 MG PO TABS
25.0000 mg | ORAL_TABLET | Freq: Every day | ORAL | Status: DC
  Administered 2022-05-31 – 2022-06-08 (×9): 25 mg via ORAL
  Filled 2022-05-31 (×9): qty 1

## 2022-05-31 MED ORDER — TORSEMIDE 20 MG PO TABS
40.0000 mg | ORAL_TABLET | Freq: Two times a day (BID) | ORAL | Status: DC
  Administered 2022-05-31 – 2022-06-03 (×6): 40 mg via ORAL
  Filled 2022-05-31 (×7): qty 2

## 2022-05-31 MED ORDER — GADOBUTROL 1 MMOL/ML IV SOLN
6.0000 mL | Freq: Once | INTRAVENOUS | Status: AC | PRN
  Administered 2022-05-31: 6 mL via INTRAVENOUS

## 2022-05-31 NOTE — Progress Notes (Signed)
Mobility Specialist - Progress Note   05/31/22 1401  Mobility  Activity Ambulated independently in hallway  Level of Assistance Independent  Assistive Device None  Distance Ambulated (ft) 250 ft  Activity Response Tolerated well  Mobility Referral Yes  $Mobility charge 1 Mobility   Pt received in bed and agreeable to mobility. Pt wears O2 in bed, but states it is not needed during ambulation. I believe if she wore O2 during ambulation she would probably be able to ambulate a little further. No complaints during mobility. Pt to bed after session with all needs met.     [facility]

## 2022-05-31 NOTE — Progress Notes (Signed)
Occupational Therapy Treatment Patient Details Name: Alyssa IGLEHART MRN: [id number] DOB: [birth year] Today's Date: 05/31/2022   History of present illness Ms. Blackley is a 61 yo female with PMH RA (previously on MTX, just taken off per rheumatology on 10/24), DMII, HLD, HTN who presented with recurrent right-sided abdominal pain. Of note, she says she does not have Lupus despite showing on her PMH.   This has been an ongoing complaint for several months.  She has gone recent work-up with surgery including multiple imaging studies and HIDA scan performed 04/11/2022.  EF 18% on HIDA scan with patent cystic duct and CBD.     She has also been having extensive generalized edema for several months.  She was evaluated outpatient by general surgery regarding possible cholecystectomy but due to her volume overload, was considered high risk for surgery until edema improved and etiology found.  Echo was performed 03/15/2022 which showed normal EF, 60 to 65%, no RWMA, normal diastolic parameters.  Small pericardial effusion.  She has undergone multiple duplex studies.  All negative for DVT.     She has also followed up with cardiology, recently seen on 04/27/2022.  She has been trialed with compression stockings with minimal effect due to discomfort and being too tight.  Lasix was transitioned to torsemide.  Amlodipine was discontinued altogether in case of contributing.  Her edema is considered noncardiac in etiology.  She was recommended for referral to heme-onc to consider bone marrow biopsy.      She is admitted for her recurrent abdominal pain as noted and further work-up of possible regarding her ongoing edema.  Assessment and Plan:   OT comments  Patient was instructed on upper and lower body therapeutic exercises for improved strengthening and endurance needed to optimize her ADL performance. She performed teach back of exercises seated EOB, demonstrating good tolerance. Overall, she has made gradual functional  progression as a result of receiving OT services and she has met her OT goals. She does not require further OT services, therefore OT will sign off.    Recommendations for follow up therapy are one component of a multi-disciplinary discharge planning process, led by the attending physician.  Recommendations may be updated based on patient status, additional functional criteria and insurance authorization.    Follow Up Recommendations  No OT follow up    Assistance Recommended at Discharge PRN     Equipment Recommendations  Tub/shower bench       Precautions / Restrictions Restrictions Weight Bearing Restrictions: No       Mobility Bed Mobility   Bed Mobility: Supine to Sit     Supine to sit: Modified independent (Device/Increase time) Sit to supine: Modified independent (Device/Increase time)        Transfers       Sit to Stand: Supervision                     ADL either performed or assessed with clinical judgement   ADL   Eating/Feeding: Independent   Grooming: Modified independent Grooming Details (indicate cue type and reason): at sink level         Upper Body Dressing : Independent;Sitting   Lower Body Dressing: Independent Lower Body Dressing Details (indicate cue type and reason): sitting EOB                               Cognition Arousal/Alertness: Awake/alert Behavior During Therapy: [facility] for  tasks assessed/performed Overall Cognitive Status: Within Functional Limits for tasks assessed                                 Pertinent Vitals/ Pain       Pain Assessment Pain Assessment: No/denies pain         Frequency   (no further OT needs)        Progress Toward Goals  OT Goals(current goals can now be found in the care plan section)  Progress towards OT goals: Goals met/education completed, patient discharged from OT  Acute Rehab OT Goals Patient Stated Goal: to return home soon OT Goal Formulation:  All assessment and education complete, DC therapy  Plan Discharge plan needs to be updated       AM-PAC OT "6 Clicks" Daily Activity     Outcome Measure   Help from another person eating meals?: None Help from another person taking care of personal grooming?: None Help from another person toileting, which includes using toliet, bedpan, or urinal?: None Help from another person bathing (including washing, rinsing, drying)?: A Little Help from another person to put on and taking off regular upper body clothing?: None Help from another person to put on and taking off regular lower body clothing?: None 6 Click Score: 23    End of Session Equipment Utilized During Treatment: Oxygen  OT Visit Diagnosis: Unsteadiness on feet (R26.81);Muscle weakness (generalized) (M62.81);Pain   Activity Tolerance Patient tolerated treatment well   Patient Left in bed;with call bell/phone within reach   Nurse Communication Mobility status        Time: 9450-3888 OT Time Calculation (min): 12 min  Charges: OT General Charges $OT Visit: 1 Visit OT Treatments $Therapeutic Exercise: 8-22 mins    Leota Sauers, OTR/L 05/31/2022, 5:37 PM

## 2022-05-31 NOTE — Progress Notes (Signed)
Mobility Specialist - Progress Note   05/31/22 0928  Mobility  Activity Ambulated independently in hallway  Level of Assistance Independent  Assistive Device None  Distance Ambulated (ft) 250 ft  Activity Response Tolerated well  Mobility Referral Yes  $Mobility charge 1 Mobility   Pt received in bed and agreeable to mobility. Upon arrival pt was on O2. Pt stated she did not need O2 while ambulating. No complaints during mobility. Pt to bed after session with all needs met.    [facility]

## 2022-05-31 NOTE — Progress Notes (Signed)
[facility] Progress Note Patient: Alyssa Rhodes JME:[id number] DOB: July 14, 1961 DOA: 05/23/2022  DOS: the patient was seen and examined on 05/31/2022  Brief hospital course: [name] is a 61 yo female with PMH RA (previously on MTX, just taken off per rheumatology on 10/24), DMII, HLD, HTN who presented with recurrent right-sided abdominal pain.  Has history of rheumatoid arthritis and follows up with Dr. Amil Amen.   Recently had work-up with surgery including multiple imaging studies and HIDA scan performed 04/11/2022.  EF 18% on HIDA scan with patent cystic duct and CBD. She has also been having extensive generalized edema for several months.  Echo was performed 03/15/2022 which showed normal EF, 60 to 65%, no RWMA, normal diastolic parameters.  Small pericardial effusion. She has undergone multiple duplex studies.  All negative for DVT. She was seen by cardiology, on 04/27/2022.  They felt her edema is noncardiac in etiology and recommended for referral to heme-onc to consider bone marrow biopsy. Currently being aggressively diuresed with IV Lasix.  11/4 LFT elevation in thousands, now improving. 11/5 liver continues to improve.  Per discussion with GI this appears to be ischemic injury rather than reducing reticulocyte injury.  11/6 discussed with cardiology, RV appears to be normal.  Currently does not appear to have any indication for right heart cath. 11/7 discussed with rheumatology.  Recent follow-up in the clinic and work-up does not show any evidence of any rheumatological condition contributing to her anasarca.  Cortisol level low.  MRI negative for any pituitary tumor.  Thoracentesis ordered.  Oral diuretics initiated. Assessment and Plan: Anasarca. Acute on chronic HFpEF This appears to be an ongoing issue for several months. Patient had outpatient work-up with cardiology including an echocardiogram which showed preserved EF. Cardiology felt that the edema is noncardiac. Albumin  level not severely low. Cortisol level low but cosyntropin stimulation test normal. No renal failure. Protein creatinine ratio is not consistent with nephrotic syndrome. Oncology was consulted via phone, recommend no need for intervention. TSH suppressed and free T4 normal.  Less likely myxedema. No cushingoid features.  ACTH low. Patient's edematous weight is around 160 pounds, on admission weight was 147 pound currently 133 pound.  Patient was treated aggressively with IV albumin plus IV Lasix initially and later on with IV Lasix. Zaroxolyn was given for 1 dose but developed severe LFT elevation therefore now discontinued. For now patient will be started on oral torsemide and Aldactone and will monitor reaction. Per discussion with cardiology, if the patient does not have any other explanation, they may consider right heart cath outpatient. Possibility of a malignancy cannot be ruled out as well as a lymphatic issue cannot be ruled out although currently no indication to perform further work-up.  Right chylous pleural effusion. Patient had a pleural effusion underwent thoracentesis which was chylous in nature. Cultures negative.  Cytology negative. Patient has mild reaccumulation of the fluid and will perform another thoracentesis and sent more work-up.  Diffuse upper abdominal pain. This is mostly consistent with edema rather than intra-abdominal pathology. Patient had extensive work-up during her recent hospital stay including CT and MRI and endoscopy. The only positive finding was a positive HIDA scan with EF 18%. General surgery was consulted. Patient is currently off of methotrexate for gallbladder surgery. But currently I do not think there is any significant indication for gallbladder surgery until her anasarca etiology is identified.  Elevated LFT. Patient has chronically elevation of her LFT which was actually improving with diuresis when This is  most likely passive venous  congestion. LFTs elevated to 2000 with normal bilirubin and normal INR in the setting of use of Zaroxolyn most likely an ischemic injury.  Ultrasound Doppler negative for any thrombus.  RUQ ultrasound shows sludge. Now LFTs improving back again to 100. Monitor.  Hypokalemia. Replace.  Rheumatoid arthritis. Patient was on methotrexate and Plaquenil. Patient developed some reaction with Plaquenil and therefore was discontinued. Patient was on methotrexate which was discontinued for preparation for surgery as well as there was concern that the methotrexate was associated with initiation of anasarca. Currently does not have any significant symptoms of rheumatoid arthritis. Monitor. Outpatient follow-up with Dr. Beekman.  HTN. Blood pressures stable on current regimen. Monitor.  Type 2 diabetes mellitus, with HLD without long-term insulin use. On sliding scale insulin.  Bilateral leg pain. On gabapentin. Monitor.  GERD. On PPI.  Low ACTH.  Low TSH. Patient had a cortisol level which was very low. Work-up was initiated patient has normal cosyntropin stimulation test. TSH is undetectably low although T4 is normal. ACTH is severely low as well. MRI brain with and without contrast was performed which is negative for any pituitary tumor.  Abnormal MRI. Patient does have some T2 hyperintense lesions. Incidental finding patient does not have any focal deficit. Although patient will benefit from outpatient neurology follow-up.  Gallbladder disease. HIDA scan shows low EF on gallbladder. General surgery considering lap chole once work-up is completed for anasarca.  Severe protein calorie malnutrition. Dietitian following. Continue supplement.   Subjective: Continues to have abdominal pain.  No headache.  No dizziness or events.  No chest pain.  Swelling of the leg remaining stable.  Physical Exam: General: in no distress;  Cardiovascular: S1 and S2 Present, no  Murmur Respiratory: Increased respiratory effort, Bilateral Air entry present, no Crackles, no wheezes Abdomen: Bowel Sound present, diffusely tender Extremities: Bilateral edema improving now only trace Neurology: alert and oriented to time, place, and person   Data Reviewed: I have Reviewed nursing notes, Vitals, and Lab results. Since last encounter, pertinent lab results CBC and BMP   . I have ordered test including BMP and magnesium  . I have discussed pt's care plan and test results with rheumatology  . I have ordered imaging MRI brain  .   Disposition: Status is: Inpatient Remains inpatient appropriate because: Need for diuretics and monitor for stability of renal function with successful diuresis response on oral therapy.  Place TED hose Start: 05/29/22 1553 enoxaparin (LOVENOX) injection 40 mg Start: 05/28/22 1100 SCDs Start: 05/24/22 0055   Family Communication: No one at bedside Level of care: Telemetry continue telemetry for CHF  Vitals:   05/30/22 2229 05/31/22 0721 05/31/22 1412 05/31/22 2057  BP: 122/81 123/79 (!) 142/78 124/82  Pulse: 87 86 96 87  Resp: 15 15 18 16  Temp: 97.8 F (36.6 C) 98.6 F (37 C) 98.3 F (36.8 C) 98 F (36.7 C)  TempSrc: Oral Oral Oral Oral  SpO2: 93% 97% 99% 97%  Weight:      Height:         Author:  , MD 05/31/2022 9:41 PM  Please look on [email or web address] to find out who is on call. 

## 2022-06-01 ENCOUNTER — Inpatient Hospital Stay (HOSPITAL_COMMUNITY): Payer: BC Managed Care – PPO

## 2022-06-01 DIAGNOSIS — R601 Generalized edema: Secondary | ICD-10-CM | POA: Diagnosis not present

## 2022-06-01 DIAGNOSIS — E43 Unspecified severe protein-calorie malnutrition: Secondary | ICD-10-CM

## 2022-06-01 DIAGNOSIS — E230 Hypopituitarism: Secondary | ICD-10-CM | POA: Diagnosis not present

## 2022-06-01 DIAGNOSIS — J940 Chylous effusion: Secondary | ICD-10-CM | POA: Diagnosis not present

## 2022-06-01 DIAGNOSIS — M79604 Pain in right leg: Secondary | ICD-10-CM

## 2022-06-01 DIAGNOSIS — R112 Nausea with vomiting, unspecified: Secondary | ICD-10-CM

## 2022-06-01 DIAGNOSIS — M79605 Pain in left leg: Secondary | ICD-10-CM | POA: Insufficient documentation

## 2022-06-01 LAB — COMPREHENSIVE METABOLIC PANEL
ALT: 105 U/L — ABNORMAL HIGH (ref 0–44)
AST: 53 U/L — ABNORMAL HIGH (ref 15–41)
Albumin: 3.5 g/dL (ref 3.5–5.0)
Alkaline Phosphatase: 93 U/L (ref 38–126)
Anion gap: 11 (ref 5–15)
BUN: 35 mg/dL — ABNORMAL HIGH (ref 8–23)
CO2: 37 mmol/L — ABNORMAL HIGH (ref 22–32)
Calcium: 9.3 mg/dL (ref 8.9–10.3)
Chloride: 90 mmol/L — ABNORMAL LOW (ref 98–111)
Creatinine, Ser: 1.13 mg/dL — ABNORMAL HIGH (ref 0.44–1.00)
GFR, Estimated: 55 mL/min — ABNORMAL LOW (ref >60)
Glucose, Bld: 145 mg/dL — ABNORMAL HIGH (ref 70–99)
Potassium: 3.3 mmol/L — ABNORMAL LOW (ref 3.5–5.1)
Sodium: 138 mmol/L (ref 135–145)
Total Bilirubin: 0.5 mg/dL (ref 0.3–1.2)
Total Protein: 6.5 g/dL (ref 6.5–8.1)

## 2022-06-01 LAB — BODY FLUID CELL COUNT WITH DIFFERENTIAL
Eos, Fluid: 1 %
Lymphs, Fluid: 31 %
Monocyte-Macrophage-Serous Fluid: 59 % (ref 50–90)
Neutrophil Count, Fluid: 9 % (ref 0–25)
Total Nucleated Cell Count, Fluid: 459 cu mm (ref 0–1000)

## 2022-06-01 LAB — PROTEIN, URINE, 24 HOUR
Collection Interval-UPROT: 24 hours
Protein, Urine: <6 mg/dL
Urine Total Volume-UPROT: 2500 mL

## 2022-06-01 LAB — GLUCOSE, CAPILLARY
Glucose-Capillary: 118 mg/dL — ABNORMAL HIGH (ref 70–99)
Glucose-Capillary: 176 mg/dL — ABNORMAL HIGH (ref 70–99)
Glucose-Capillary: 157 mg/dL — ABNORMAL HIGH (ref 70–99)
Glucose-Capillary: 212 mg/dL — ABNORMAL HIGH (ref 70–99)

## 2022-06-01 LAB — GLUCOSE, PLEURAL OR PERITONEAL FLUID: Glucose, Fluid: 191 mg/dL

## 2022-06-01 LAB — PROTEIN, PLEURAL OR PERITONEAL FLUID: Total protein, fluid: 4 g/dL

## 2022-06-01 LAB — LACTATE DEHYDROGENASE, PLEURAL OR PERITONEAL FLUID: LD, Fluid: 77 U/L — ABNORMAL HIGH (ref 3–23)

## 2022-06-01 LAB — VITAMIN B1: Vitamin B1 (Thiamine): 135.8 nmol/L (ref 66.5–200.0)

## 2022-06-01 LAB — MAGNESIUM: Magnesium: 1.9 mg/dL (ref 1.7–2.4)

## 2022-06-01 MED ORDER — LIDOCAINE HCL 1 % IJ SOLN
INTRAMUSCULAR | Status: AC
  Filled 2022-06-01: qty 20

## 2022-06-01 MED ORDER — MEDIUM CHAIN TRIGLYCERIDES PO OIL
10.0000 mL | TOPICAL_OIL | Freq: Three times a day (TID) | ORAL | Status: AC
  Administered 2022-06-02 – 2022-06-03 (×3): 10 mL via ORAL
  Filled 2022-06-01 (×3): qty 10

## 2022-06-01 MED ORDER — MEDIUM CHAIN TRIGLYCERIDES PO OIL
20.0000 mL | TOPICAL_OIL | Freq: Three times a day (TID) | ORAL | Status: DC
  Administered 2022-06-04 – 2022-06-05 (×4): 20 mL via ORAL
  Filled 2022-06-01 (×6): qty 20

## 2022-06-01 MED ORDER — POTASSIUM CHLORIDE 20 MEQ PO PACK
40.0000 meq | PACK | Freq: Once | ORAL | Status: AC
  Administered 2022-06-01: 40 meq via ORAL
  Filled 2022-06-01: qty 2

## 2022-06-01 MED ORDER — POTASSIUM CHLORIDE CRYS ER 20 MEQ PO TBCR: 40.0000 meq | EXTENDED_RELEASE_TABLET | Freq: Once | ORAL | Status: DC

## 2022-06-01 MED ORDER — MEDIUM CHAIN TRIGLYCERIDES PO OIL
5.0000 mL | TOPICAL_OIL | Freq: Three times a day (TID) | ORAL | Status: AC
  Administered 2022-06-01 – 2022-06-02 (×3): 5 mL via ORAL
  Filled 2022-06-01 (×3): qty 5

## 2022-06-01 MED ORDER — BOOST / RESOURCE BREEZE PO LIQD CUSTOM
1.0000 | Freq: Three times a day (TID) | ORAL | Status: DC
  Administered 2022-06-01 – 2022-06-05 (×9): 1 via ORAL

## 2022-06-01 MED ORDER — MEDIUM CHAIN TRIGLYCERIDES PO OIL
15.0000 mL | TOPICAL_OIL | Freq: Three times a day (TID) | ORAL | Status: AC
  Administered 2022-06-03 – 2022-06-04 (×3): 15 mL via ORAL
  Filled 2022-06-01 (×4): qty 15

## 2022-06-01 MED ORDER — PROSOURCE PLUS PO LIQD
30.0000 mL | Freq: Three times a day (TID) | ORAL | Status: DC
  Administered 2022-06-01 – 2022-06-08 (×10): 30 mL via ORAL
  Filled 2022-06-01 (×13): qty 30

## 2022-06-01 NOTE — Progress Notes (Signed)
Mobility Specialist - Progress Note   06/01/22 0909  Mobility  Activity Ambulated independently in hallway  Level of Assistance Independent  Assistive Device None  Distance Ambulated (ft) 300 ft  Activity Response Tolerated well  Mobility Referral Yes  $Mobility charge 1 Mobility   Pt received in bed and agreeable to mobility. No complaints during mobility. Pt did hold on to wall rails a few times throughout ambulation. Pt to bed after session with all needs met.      [facility]

## 2022-06-01 NOTE — Progress Notes (Signed)
PROGRESS NOTE    JYL CHICO  LOV:[id number] DOB: 1960/08/02 DOA: 05/23/2022 PCP: Doretha Sou, MD   Brief Narrative: Alyssa Rhodes is a 61 y.o. female with a history of rheumatoid arthritis, diabetes mellitus type 2, hyperlipidemia, hypertension. Patient presented secondary to right sided abdominal pain. Her pain was managed conservatively but she also had significant anasarca requiring IV diuresis with improvement of edema. During treatment, patient appears to have suffered possible ischemic injury to liver as evident by significantly elevated AST/ALT which is now improving. 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 Low fat diet. Pulmonology consulted.   Assessment and Plan: * Anasarca Chronic. Unclear etiology. Patient evaluated by cardiology with concern for non-cardiogenic etiology. Patient managed with Lasix IV, zaroxolyn and albumin with improvement of edema. Weight on admission of 67 kg (147.71 lbs) with most recent weight of 60.3 kg (133 lbs) on 11/6. Lasix IV transitioned to torsemide PO. 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 General surgery consulted while inpatient. HIDA scan noted to be significant for reduced EF and patent cystic and common bile ducts. Patient without indication for inpatient cholecystectomy, so procedure deferred to outpatient. Abdominal pain appears to be improved from previous.  Bilateral leg pain -Continue gabapentin  ACTH deficiency (HCC) Unclear etiology, although patient does have an incidental right adrenal nodularity likely to be an underlying adenoma. Normal cortisol level with low ACTH level of 3. Normal TSH. Patient will need endocrinology follow-up as an outpatient.  Pericardial  effusion Noted one cho from 02/2022.  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 Patient has received two thoracentesis, both with milky fluid consistent with chylothorax. No trauma or recent surgeries. No known malignancy. Recent CT abdomen/pelvis without contrast unremarkable for etiology. -Pleural fluid triglycerides and cholesterol -CT chest w/contrast -Pulmonology consult -Dietitian consult  Protein-calorie malnutrition, severe Dietitian recommendations (11/1): Ensure MAX Protein po BID, each supplement provides 150 kcal and 30 grams of protein (1.5g fat per bottle) Multivitamin with minerals daily Prosource Plus PO TID, each provides 100 kcals and 15g protein (0g fat)  Transaminitis Severely elevated AST/ALT up to 2,146/1,035. Presumed ischemic per GI discussion this admission. AST/ALT trending down.  Hypokalemia -Potassium supplementation  Rheumatoid arthritis ([place]) Patient previously on methotrexate and was discontinued for plan for cholecystectomy.  Type 2 diabetes mellitus with complication, without long-term current use of insulin (HCC) Most recent hemoglobin A1C of 7.5%. Uncontrolled with hyperglycemia. -Continue SSI  HTN (hypertension) Mostly normotensive.    DVT prophylaxis: Lovenox Code Status:   Code Status: Full Code Family Communication: None at bedside Disposition Plan: Discharge home pending workup/management of chylothorax   Consultants:  General surgery Pulmonology  Procedures:  Thoracentesis  Antimicrobials: None    Subjective: Patient reports no issues this afternoon. Eating lunch. No chest pain or dyspnea at this time. No recent trauma/surgeries. No known malignancy.  Objective: BP 125/74 (BP Location: Left Arm)   Pulse 88   Temp 98.6 F (37 C) (Oral)   Resp 16   Ht '5\' 1"'$  (1.549 m)   Wt 60.3 kg   SpO2 96%   BMI 25.13 kg/m   Examination:  General exam: Appears calm and comfortable Respiratory system:   Respiratory effort normal. Cardiovascular system: S1 & S2 heard, RRR. No murmurs, rubs, gallops or clicks. Gastrointestinal system: Abdomen is nondistended, soft and nontender.  No organomegaly or masses felt. Normal bowel sounds heard. Central nervous system: Alert and oriented. No focal neurological deficits. Musculoskeletal: No calf tenderness Skin: No cyanosis. No rashes Psychiatry: Judgement and insight appear normal. Mood & affect appropriate.    Data Reviewed: I have personally reviewed following labs and imaging studies  CBC Lab Results  Component Value Date   WBC 5.0 05/29/2022   RBC 3.82 (L) 05/29/2022   HGB 12.2 05/29/2022   HCT 37.7 05/29/2022   MCV 98.7 05/29/2022   MCH 31.9 05/29/2022   PLT 592 (H) 05/29/2022   MCHC 32.4 05/29/2022   RDW 14.1 05/29/2022   LYMPHSABS 0.9 05/29/2022   MONOABS 0.4 05/29/2022   EOSABS 0.2 05/29/2022   BASOSABS 0.0 85/08/7739     Last metabolic panel Lab Results  Component Value Date   NA 138 06/01/2022   K 3.3 (L) 06/01/2022   CL 90 (L) 06/01/2022   CO2 37 (H) 06/01/2022   BUN 35 (H) 06/01/2022   CREATININE 1.13 (H) 06/01/2022   GLUCOSE 145 (H) 06/01/2022   GFRNONAA 55 (L) 06/01/2022   GFRAA >60 12/17/2019   CALCIUM 9.3 06/01/2022   PHOS 3.7 05/24/2022   PROT 6.5 06/01/2022   ALBUMIN 3.5 06/01/2022   LABGLOB 2.8 03/17/2022   AGRATIO 1.1 03/17/2022   BILITOT 0.5 06/01/2022   ALKPHOS 93 06/01/2022   AST 53 (H) 06/01/2022   ALT 105 (H) 06/01/2022   ANIONGAP 11 06/01/2022    GFR: Estimated Creatinine Clearance: 43.6 mL/min (A) (by C-G formula based on SCr of 1.13 mg/dL (H)).  Recent Results (from the past 240 hour(s))  Acid Fast Smear (AFB)     Status: None   Collection Time: 05/24/22  3:55 PM   Specimen: PATH Cytology Pleural fluid  Result Value Ref Range Status   AFB Specimen Processing Concentration  Final   Acid Fast Smear Negative  Final    Comment: (NOTE) Performed At: [facility] [place], [place] [id number] Rush Farmer MD CN:[id number]    Source (AFB) PLEURAL  Final    Comment: Performed at [facility], [place] [address] [place], [place] [address]  Body fluid culture w Gram Stain     Status: None   Collection Time: 05/24/22  3:55 PM   Specimen: PATH Cytology Pleural fluid  Result Value Ref Range Status   Specimen Description   Final    PLEURAL Performed at [place] [address] [place], [place] [address]    Special Requests   Final    NONE Performed at [facility], [place] [address] [place], [place] [address]    Gram Stain   Final    RARE WBC PRESENT, PREDOMINANTLY MONONUCLEAR NO ORGANISMS SEEN    Culture   Final    NO GROWTH 3 DAYS Performed at [facility], [place] [address], [place],  [address]    Report Status 05/28/2022 FINAL  Final      Radiology Studies: US THORACENTESIS ASP PLEURAL SPACE W/IMG GUIDE  Result Date: 06/01/2022 INDICATION: Patient with history of RA, DM type 2, HTN and recurrent right pleural effusion. Request received for diagnostic and therapeutic right thoracentesis. EXAM: ULTRASOUND GUIDED DIAGNOSTIC AND THERAPEUTIC RIGHT THORACENTESIS MEDICATIONS: 10 mL 1 % lidocaine COMPLICATIONS: None immediate. PROCEDURE: An ultrasound guided thoracentesis was thoroughly discussed with the patient and questions answered. The benefits, risks, alternatives and complications were also discussed. The patient understands and wishes to proceed with the procedure.  Written consent was obtained. Ultrasound was performed to localize and mark an adequate pocket of fluid in the right chest. The area was then prepped and draped in the normal sterile fashion. 1% Lidocaine was used for local anesthesia. Under ultrasound guidance a 6 Fr Safe-T-Centesis catheter was introduced. Thoracentesis was performed. The catheter was removed and a dressing applied. FINDINGS: A total of approximately 850  cc of chylous fluid was removed. Samples were sent to the laboratory as requested by the clinical team. IMPRESSION: Successful ultrasound guided right thoracentesis yielding 850 cc of pleural fluid. Read by: Narda Rutherford, AGNP-BC Electronically Signed   By: Ruthann Cancer M.D.   On: 06/01/2022 12:03   DG CHEST PORT 1 VIEW  Result Date: 06/01/2022 CLINICAL DATA:  Post right thoracentesis EXAM: PORTABLE CHEST 1 VIEW COMPARISON:  Chest 05/30/2022 FINDINGS: Near complete clearing of right pleural effusion. Improved aeration right lung base. No pneumothorax. No change in left pleural effusion and left lower lobe atelectasis. Negative for heart failure. IMPRESSION: 1. Near complete clearing of right pleural effusion following thoracentesis. No pneumothorax. 2. Left pleural effusion and left lower lobe atelectasis unchanged. Electronically Signed   By: Franchot Gallo M.D.   On: 06/01/2022 11:25   MR BRAIN W WO CONTRAST  Result Date: 05/31/2022 EXAM: MRI HEAD WITHOUT AND WITH CONTRAST TECHNIQUE: Multiplanar, multiecho pulse sequences of the brain and surrounding structures were obtained without and with intravenous contrast. CONTRAST:  75m GADAVIST GADOBUTROL 1 MMOL/ML IV SOLN COMPARISON:  None Available. FINDINGS: Brain: No acute infarction, hemorrhage, hydrocephalus, extra-axial collection or mass lesion. Scattered and confluent foci of T2 hyperintensity are seen within the white the cerebral hemispheres, nonspecific. No focus of abnormal contrast enhancement identified. Vascular: Normal flow voids. Skull and upper cervical spine: Normal marrow signal. Sinuses/Orbits: Negative. Other: None. IMPRESSION: 1. Mild-to-moderate amount of T2 hyperintense lesions of the white matter in a nonspecific distribution. Differential diagnosis include chronic microangiopathy, demyelinating disease, post inflammatory/infectious processes and other autoimmune diseases. 2. No pituitary tumor identified in this routine protocol brain  MRI. Electronically Signed   By: KPedro EarlsM.D.   On: 05/31/2022 16:27      LOS: 9 days    RCordelia Poche MD [facility] 06/01/2022, 2:51 PM   If 7PM-7AM, please contact night-coverage [email or web address]

## 2022-06-01 NOTE — Assessment & Plan Note (Addendum)
Noted one echo from 02/2022.

## 2022-06-01 NOTE — Assessment & Plan Note (Addendum)
Unclear etiology, although patient does have an incidental right adrenal nodularity likely to be an underlying adenoma. Normal cortisol level with low ACTH level of 3. Normal TSH. Patient will need endocrinology follow-up as an outpatient.

## 2022-06-01 NOTE — Progress Notes (Signed)
Nutrition Follow-up  DOCUMENTATION CODES:   Severe malnutrition in context of chronic illness  INTERVENTION:   -Restricted fat diet to no more than 10g fat per day -Reviewed with patient -MD to order MCT oil, 5 ml TID then increase by 5 ml every day to recommended dose of 20 ml TID (provides ~460 fat kcals)  -Encouraged pt to continue protein supplements: -Prosource Plus PO TID, each provides 100 kcals and 15g protein (0g fat) -Boost Breeze po TID, each supplement provides 250 kcal and 9 grams of protein (0g fat) -D/c Ensure Max -pt request  NUTRITION DIAGNOSIS:   Severe Malnutrition related to chronic illness (abdominal pain, anasarca) as evidenced by energy intake < or equal to 75% for > or equal to 1 month, moderate fat depletion, severe muscle depletion.  Ongoing.  GOAL:   Patient will meet greater than or equal to 90% of their needs  Progressing.  MONITOR:   PO intake, Supplement acceptance, Weight trends, I & O's, Labs  REASON FOR ASSESSMENT:   Consult Assessment of nutrition requirement/status  ASSESSMENT:   61 yo female with PMH RA (previously on MTX, just taken off per rheumatology on 10/24), DMII, HLD, HTN. Admitted with right-sided abdominal pain.  10/31: thoracentesis, 1.3L chylous fluid   Patient s/p thoracentesis today, yielding 850 ml chylous fluid.  RD consulted by MD to further restrict fat in diet d/t possible chylothorax. Per pulmonology, likely but not confirmed yet. Placed daily limit of 10g fat per day on diet order. Made recommendation to MD to start pt on MCT oil, MD has ordered. Will start with lower dose then titrate up daily to goal of 20 ml/day to help with tolerance.   Pt states she hasn't been finishing her meals but has made a point to consume her protein foods (meats). Brought pt a list of foods that are acceptable for fat-free diet. Emphasized that her main sources of protein will be her protein supplements, all of which are fat-free. Pt  did not like Ensure Max so has not been drinking. Agreeable to trying Boost Breeze. Will increase Prosource supplements given pt's acceptance of supplements.   Admission weight: 147 lbs Last weight 11/6: 133 lbs  Medications: Multivitamin with minerals daily, KLOR-CON, Senokot  Labs reviewed: CBGs: 118-180 Low K   Diet Order:   Diet Order             Diet Heart Room service appropriate? Yes; Fluid consistency: Thin  Diet effective now                   EDUCATION NEEDS:   Education needs have been addressed  Skin:  Skin Assessment: Reviewed RN Assessment  Last BM:  11/7  Height:   Ht Readings from Last 1 Encounters:  05/24/22 '5\' 1"'$  (1.549 m)    Weight:   Wt Readings from Last 1 Encounters:  05/30/22 60.3 kg    BMI:  Body mass index is 25.13 kg/m.  Estimated Nutritional Needs:   Kcal:  1700-1900  Protein:  85-100g  Fluid:  1.9L/day  Clayton Bibles, MS, RD, LDN Inpatient Clinical Dietitian Contact information available via Amion

## 2022-06-01 NOTE — Assessment & Plan Note (Addendum)
Dietitian recommendations (11/13): -Decrease MCT oil dose to 5 ml, provide QID (20 ml total daily providing ~153 fat kcals) -If pt unable to tolerate MCT oil, may need to consider starting TPN/SMOF lipid infusion to help prevent essential fatty acid deficiency Restricted fat diet to no more than 10g fat per day  Prosource Plus PO QID, each provides 100 kcals and 15g protein Multivitamin with minerals daily Boost Breeze po TID, each supplement provides 250 kcal and 9 grams of protein

## 2022-06-01 NOTE — Progress Notes (Signed)
Physical Therapy Treatment Patient Details Name: Alyssa Rhodes MRN: [id number] DOB: 03-26-1961 Today's Date: 06/01/2022   History of Present Illness Alyssa Rhodes is a 61 yo female with PMH RA (previously on MTX, just taken off per rheumatology on 10/24), DMII, HLD, HTN who presented with recurrent right-sided abdominal pain. Of note, she says she does not have Lupus despite showing on her PMH.   This has been an ongoing complaint for several months.  She has gone recent work-up with surgery including multiple imaging studies and HIDA scan performed 04/11/2022.  EF 18% on HIDA scan with patent cystic duct and CBD.     She has also been having extensive generalized edema for several months.  She was evaluated outpatient by general surgery regarding possible cholecystectomy but due to her volume overload, was considered high risk for surgery until edema improved and etiology found.  Echo was performed 03/15/2022 which showed normal EF, 60 to 65%, no RWMA, normal diastolic parameters.  Small pericardial effusion.  She has undergone multiple duplex studies.  All negative for DVT.     She has also followed up with cardiology, recently seen on 04/27/2022.  She has been trialed with compression stockings with minimal effect due to discomfort and being too tight.  Lasix was transitioned to torsemide.  Amlodipine was discontinued altogether in case of contributing.  Her edema is considered noncardiac in etiology.  She was recommended for referral to heme-onc to consider bone marrow biopsy.      She is admitted for her recurrent abdominal pain as noted and further work-up of possible regarding her ongoing edema.  Assessment and Plan:    PT Comments    Very pleasant to work with. Stated she felt better after thoracentesis. Denied any pain. Patient was modified I with bed mobility; supervision for transfers; min guard for ambulation. Trial ambulation with cane and rollator. Patient stated "I feel wobbly" with the cane. Unsafe  turns with cane with LOB but self corrected and recovered. Pt felt more comfortable with rollator, tolerated well. Rest break needed halfway through ambulation due to fatigue. O2 and HR listed below. Patient plans on returning home with family support. EOB               RA: 97%       HR: 98% Ambulation     RA: 99%   HR:106  Recommendations for follow up therapy are one component of a multi-disciplinary discharge planning process, led by the attending physician.  Recommendations may be updated based on patient status, additional functional criteria and insurance authorization.  Follow Up Recommendations  Home health PT     Assistance Recommended at Discharge Intermittent Supervision/Assistance  Patient can return home with the following A little help with walking and/or transfers;A little help with bathing/dressing/bathroom;Assistance with cooking/housework;Assist for transportation;Help with stairs or ramp for entrance   Equipment Recommendations  None recommended by PT    Recommendations for Other Services       Precautions / Restrictions Precautions Precautions: Fall Precaution Comments: monitor sats Restrictions Weight Bearing Restrictions: No     Mobility  Bed Mobility Overal bed mobility: Needs Assistance Bed Mobility: Supine to Sit     Supine to sit: Modified independent (Device/Increase time), HOB elevated Sit to supine: Modified independent (Device/Increase time)   General bed mobility comments: incr time, no physical assist    Transfers Overall transfer level: Needs assistance Equipment used: Straight cane Transfers: Sit to/from Stand Sit to Stand: Supervision  General transfer comment: for safety, incr time.    Ambulation/Gait Ambulation/Gait assistance: Min guard Gait Distance (Feet): 325 Feet Assistive device: Rollator (4 wheels), Straight cane Gait Pattern/deviations: Step-through pattern, Decreased stride length, Wide base of support Gait  velocity: decreased     General Gait Details: Trial ambulation with cane and rollator. Patient stated "I feel wobbly" with the cane. unsafe turns with cane with LOB but self corrected and recovered. Pt felt more comfortable with rollator.   Stairs             Wheelchair Mobility    Modified Rankin (Stroke Patients Only)       Balance                                            Cognition Arousal/Alertness: Awake/alert Behavior During Therapy: WFL for tasks assessed/performed Overall Cognitive Status: Within Functional Limits for tasks assessed                                 General Comments: AxO x 3 very sweet lady.        Exercises      General Comments        Pertinent Vitals/Pain Pain Assessment Pain Assessment: No/denies pain Pain Intervention(s): Monitored during session    Home Living                          Prior Function            PT Goals (current goals can now be found in the care plan section) Acute Rehab PT Goals Patient Stated Goal: legs to stop hurting/swelling to go down PT Goal Formulation: With patient Time For Goal Achievement: 06/08/22 Potential to Achieve Goals: Good Progress towards PT goals: Progressing toward goals    Frequency    Min 3X/week      PT Plan Current plan remains appropriate    Co-evaluation              AM-PAC PT "6 Clicks" Mobility   Outcome Measure  Help needed turning from your back to your side while in a flat bed without using bedrails?: None Help needed moving from lying on your back to sitting on the side of a flat bed without using bedrails?: A Little Help needed moving to and from a bed to a chair (including a wheelchair)?: A Little Help needed standing up from a chair using your arms (e.g., wheelchair or bedside chair)?: A Little Help needed to walk in hospital room?: A Little Help needed climbing 3-5 steps with a railing? : A Little 6  Click Score: 19    End of Session Equipment Utilized During Treatment: Gait belt Activity Tolerance: Patient tolerated treatment well Patient left: with call bell/phone within reach;in bed;with bed alarm set Nurse Communication: Mobility status PT Visit Diagnosis: Muscle weakness (generalized) (M62.81);Difficulty in walking, not elsewhere classified (R26.2);Unsteadiness on feet (R26.81)     Time: 1400-1415 PT Time Calculation (min) (ACUTE ONLY): 15 min  Charges:  $Gait Training: 8-22 mins                     Quinnlan Abruzzo Chauncey Cruel 06/01/2022, 2:58 PM

## 2022-06-01 NOTE — Procedures (Addendum)
PROCEDURE SUMMARY:  Successful US guided diagnostic and therapeutic right thoracentesis. Yielded 850 cc of chylous fluid. Pt tolerated procedure well. No immediate complications.  Specimen was sent for labs. CXR ordered.  EBL < 1 mL  Tyson Alias, AGNP 06/01/2022 11:14 AM

## 2022-06-01 NOTE — Consult Note (Signed)
NAME:  Alyssa Rhodes, MRN:  [id number], DOB:  1960/10/14, LOS: 9 ADMISSION DATE:  05/23/2022, CONSULTATION DATE:  06/01/22 REFERRING MD:  Lonny Prude CHIEF COMPLAINT:  Abd pain   History of Present Illness:  Alyssa Rhodes is a 61 y.o. female who has a PMH as below including but not limited to RA (was on MTX but recently taken off by rheumatology on 10/24), DM, HTN, HLD, anasarca of unclear etiology. She presented to [facility] 10/30 with RUQ pain, N/V, LE edema. She was recently started on Torsemide for LE edema and volume overload. Abd pain has been chronic but had gotten worse. In fact, she was seen at [facility] for the same and was diagnosed with biliary dyskinesia.  She was evaluated by surgery who felt that gallbladder was not part of her issue (Had HIDA 9/18 with EF18% and patent cystic duct and CBD). She was evaluated by cardiology who did not feel that her anasarca was due to cardiac issues (echo 03/15/22 with EF 60-65%, no RWMA, small effusion. LE duplex also negative for DVT). They recommended heme-onc consult for possible bone marrow biopsy. Surgery has mentioned some concern for lymphatic disease causing her anasarca and chylothoraces (see below).  During this admission, she was found to have bilateral pleural effusions. She had right thoracentesis 10/31 by IR which yielded 1.3L of chylous appearing fluid. She again had 2nd right thora on 11/8 that again yielded 850cc of chylous appearing fluid. Cytology from first thora showed reactive mesothelial cells. Of note, no triglycerides or cholesterol was sent on pleural fluid.  She is being managed by [facility]; however, due to her suspected chylothoraces, PCCM asked to see in consultation. Per last TRH note from 11/7, discussion with rheumatology was had and they did not have any rheumatologic reason to explain her anasarca either. 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  Significant Hospital Events: Including procedures, antibiotic start and stop dates in addition to other pertinent events   10/30 admit. 10/31 right thora by IR which yielded 1.3L of chylous fluid. 11/8 2nd right thora by IR which yielded 850cc of chylous fluid. 11/8 PCCM consult.  Interim History / Subjective:    11/8 -reports being unwell for the last few to several months.  Presented with a lot of anasarca.  She states that edema is significantly improved with diuresis since admission.  Last CT scan of the chest was in August 2023.  Current CT chest with contrast has been ordered.  Objective:  Blood pressure 125/74, pulse 88, temperature 98.6 F (37 C), temperature source Oral, resp. rate 16, height _0  (1.549 m), weight 60.3 kg, SpO2 96 %.        Intake/Output Summary (Last 24 hours) at 06/01/2022 1456 Last data filed at 06/01/2022 1345 Gross per 24 hour  Intake 480 ml  Output 2175 ml  Net -1695 ml   Filed Weights   05/28/22 0933 05/29/22 0619 05/30/22 0603  Weight: 61 kg 59.9 kg 60.3 kg    Examination: General: thin. On o2. No distrss Neuro: Axox3. 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 Unclear etiology at this point. No recent surgeries to raise possibility of  thoracic duct injury etc. Of note, no trigs or cholesterol levels were sent from pleural fluid from previous thoracentesis. LDH was also on high side (77 most recently, 75 previously) and lymphocytes were normal (31% most recently, 69% previously). Classically, chylothorax fluid is exudative with a high lymphocyte count (>70 percent), a normal glucose level, a low LDH, and a cholesterol level <200 mg/dL (<5.18 mmol/L).   PLAN - Will try to add on cholesterol and triglyceride levels to today's thora labs  - next thora need TGL/Chol and FLow cytometry - CT chest chest/abdomen/pelvis to evaluate thoracic duct anatomy as well as to assess for mediastinal masses/lymphadenopathy  - With contrast  - 24h urine proetin collectio to complete  - Rx for RApamycin v Octreotide v Thoracic Duc Liggation based on above  [email or web address]  Best practice (evaluated daily):  Per triad  Labs   CBC: Recent Labs  Lab 05/26/22 0603 05/27/22 0718 05/28/22 0812 05/29/22 0907  WBC 6.0 5.8 3.4* 5.0  NEUTROABS 3.8 4.0 2.2 3.5  HGB 12.7 11.1* 12.1 12.2  HCT 39.3 34.3* 37.0 37.7  MCV 101.0* 100.9* 97.9 98.7  PLT 491* 425* 526* 592*    Basic Metabolic Panel: Recent Labs  Lab 05/28/22 0812 05/29/22 0907 05/29/22 1832 05/30/22 0736 05/31/22 1210 06/01/22 0636  NA 134* 133* 135 136 141 138  K 3.0* 2.7* 3.4* 3.0* 3.9 3.3*  CL 89* 87* 89* 89* 92* 90*  CO2 33* 36* 34* 35* 34* 37*  GLUCOSE 135* 161* 211* 137* 175* 145*  BUN 23 29* 33* 41* 33* 35*  CREATININE 0.88 0.80 1.13* 1.15* 0.93 1.13*  CALCIUM 8.9 8.9 9.3 8.9 9.7 9.3  MG 1.8 1.9  --  1.8 1.9 1.9   GFR: Estimated Creatinine Clearance: 43.6 mL/min (A) (by C-G formula based on SCr of 1.13 mg/dL (H)). Recent Labs  Lab 05/26/22 0603 05/27/22 0718 05/28/22 0812 05/29/22 0907  WBC 6.0 5.8 3.4* 5.0    Liver Function Tests: Recent Labs  Lab 05/28/22 1254 05/29/22 0907 05/30/22 0736 05/31/22 1210  06/01/22 0636  AST 1,373* 476* 171* 66* 53*  ALT 833* 512* 267* 136* 105*  ALKPHOS 260* 204* 141* 101 93  BILITOT 0.5 0.3 0.5 0.4 0.5  PROT 6.2* 6.8 6.2* 6.0* 6.5  ALBUMIN 3.5 3.5 3.4* 3.4* 3.5   No results for input(s): "LIPASE", "AMYLASE" in the last 168 hours. No results for input(s): "AMMONIA" in the last 168 hours.  ABG    Component Value Date/Time   HCO3 41.2 (H) 05/23/2022 2326   TCO2 30 12/14/2021 1001   O2SAT 33.3 05/23/2022 2326     Coagulation Profile: Recent Labs  Lab 05/28/22 1254 05/29/22 0907  INR 1.0 1.0    Cardiac Enzymes: No results for input(s): "CKTOTAL", "CKMB", "CKMBINDEX", "TROPONINI" in the last 168 hours.  HbA1C: Hgb A1c MFr Bld  Date/Time Value Ref Range Status  03/18/2022 05:28 AM 7.5 (H) 4.8 - 5.6 % Final    Comment:    (NOTE) Pre diabetes:          5.7%-6.4%  Diabetes:              >6.4%  Glycemic control for   <  7.0% adults with diabetes   12/29/2021 09:24 AM 13.5 Repeated and verified X2. (H) 4.6 - 6.5 % Final    Comment:    Glycemic Control Guidelines for People with Diabetes:Non Diabetic:  <6%Goal of Therapy: <7%Additional Action Suggested:  >8%     CBG: Recent Labs  Lab 05/31/22 1125 05/31/22 1626 05/31/22 2100 06/01/22 0746 06/01/22 1206  GLUCAP 180* 163* 161* 118* 176*    Past Medical History:  She,  has a past medical history of Diabetes mellitus without complication ([place]), Hyperlipidemia, Hypertension, Lupus ([place]), and Thyroid nodule (03/2010).   Surgical History:   Past Surgical History:  Procedure Laterality Date   ABDOMINAL HYSTERECTOMY     CESAREAN SECTION     ESOPHAGOGASTRODUODENOSCOPY (EGD) WITH PROPOFOL N/A 04/08/2022   Procedure: ESOPHAGOGASTRODUODENOSCOPY (EGD) WITH PROPOFOL;  Surgeon: Clarene Essex, MD;  Location: [facility];  Service: Gastroenterology;  Laterality: N/A;   SHOULDER SURGERY  2011   LEFT     Social History:   reports that she has quit smoking. Her smoking use included cigarettes.  She has a 41.00 pack-year smoking history. She has never used smokeless tobacco. She reports that she does not currently use alcohol after a past usage of about 3.0 standard drinks of alcohol per week. She reports that she does not use drugs.   Family History:  Her family history includes Asthma in her mother; Diabetes in her mother; Heart disease in her father; Hypertension in her father.   Allergies Allergies  Allergen Reactions   Codeine Nausea And Vomiting and Nausea Only   Hydroxychloroquine Rash     Home Medications  Prior to Admission medications   Medication Sig Start Date End Date Taking? Authorizing Provider  Ascorbic Acid (VITAMIN C PO) Take 1 tablet by mouth daily.   Yes [provider]  atorvastatin (LIPITOR) 40 MG tablet Take 1 tablet (40 mg total) by mouth daily. 12/30/21  Yes Maximiano Coss, NP  estradiol (ESTRACE) 0.1 MG/GM vaginal cream Place 1 Applicatorful vaginally 3 (three) times a week.   Yes [provider]  gabapentin (NEURONTIN) 300 MG capsule Take 1 capsule (300 mg total) by mouth daily. 03/29/22  Yes Bonnielee Haff, MD  Multiple Vitamin (MULTIVITAMIN) tablet Take 1 tablet by mouth daily with breakfast.   Yes [provider]  Omega-3 Fatty Acids (FISH OIL PO) Take 1 capsule by mouth in the morning and at bedtime.   Yes [provider]  omeprazole (PRILOSEC) 40 MG capsule Take 40 mg by mouth daily before breakfast. 03/04/22  Yes [provider]  ondansetron (ZOFRAN) 4 MG tablet Take 4 mg by mouth 3 (three) times daily as needed for nausea or vomiting. 03/12/22  Yes [provider]  oxyCODONE (OXY IR/ROXICODONE) 5 MG immediate release tablet Take 1 tablet (5 mg total) by mouth every 4 (four) hours as needed for moderate pain. 04/11/22  Yes Ghimire, Dante Gang, MD  potassium chloride (KLOR-CON) 20 MEQ packet Take 20 mEq by mouth daily.   Yes [provider]  torsemide (DEMADEX) 20 MG tablet Take 40 mg by mouth 2  (two) times daily. 05/20/22  Yes [provider]  furosemide (LASIX) 40 MG tablet Take 1 tablet (40 mg total) by mouth 2 (two) times daily. 04/11/22 05/11/22  Barb Merino, MD  metFORMIN (GLUCOPHAGE) 500 MG tablet Take 1 tablet (500 mg total) by mouth daily with breakfast. Patient not taking: Reported on 05/23/2022 04/11/22 04/11/23  Barb Merino, MD  methotrexate 2.5 MG tablet Take 4 tablets (10  mg total) by mouth every Wednesday. Please do not resume until cleared to do so by the kidney doctor at follow-up Patient not taking: Reported on 05/23/2022 03/30/22   Bonnielee Haff, MD  polyethylene glycol (MIRALAX / GLYCOLAX) 17 g packet Take 17 g by mouth daily. Patient not taking: Reported on 05/23/2022 03/29/22   Bonnielee Haff, MD

## 2022-06-01 NOTE — Assessment & Plan Note (Addendum)
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

## 2022-06-01 NOTE — Assessment & Plan Note (Signed)
Continue gabapentin.

## 2022-06-01 NOTE — Assessment & Plan Note (Addendum)
Fluid consistent with chylothorax. Right pleural fluid triglyceride level of 992. Patient has received two right thoracentesis, both with milky fluid consistent with chylothorax and one left thoracentesis with similar fluid. No trauma or recent surgeries. No known malignancy. Recent CT abdomen/pelvis without contrast unremarkable for etiology. Diet adjusted. CT chest without etiology for chylothorax. Pulmonology consulted on 11/8. -Pleural fluid cholesterol pending and left sided pleural fluid pending -Pulmonology recommendations: pending -Chest x-ray in AM

## 2022-06-01 NOTE — Plan of Care (Signed)
  Problem: Fluid Volume: Goal: Ability to maintain a balanced intake and output will improve Outcome: Progressing   Problem: Skin Integrity: Goal: Risk for impaired skin integrity will decrease Outcome: Progressing   Problem: Tissue Perfusion: Goal: Adequacy of tissue perfusion will improve Outcome: Progressing

## 2022-06-02 ENCOUNTER — Encounter (HOSPITAL_COMMUNITY): Payer: Self-pay | Admitting: Internal Medicine

## 2022-06-02 ENCOUNTER — Inpatient Hospital Stay (HOSPITAL_COMMUNITY): Payer: BC Managed Care – PPO

## 2022-06-02 DIAGNOSIS — E875 Hyperkalemia: Secondary | ICD-10-CM

## 2022-06-02 DIAGNOSIS — R601 Generalized edema: Secondary | ICD-10-CM | POA: Diagnosis not present

## 2022-06-02 DIAGNOSIS — J940 Chylous effusion: Secondary | ICD-10-CM | POA: Diagnosis not present

## 2022-06-02 DIAGNOSIS — M79604 Pain in right leg: Secondary | ICD-10-CM | POA: Diagnosis not present

## 2022-06-02 DIAGNOSIS — E230 Hypopituitarism: Secondary | ICD-10-CM | POA: Diagnosis not present

## 2022-06-02 LAB — BASIC METABOLIC PANEL
Anion gap: 14 (ref 5–15)
BUN: 41 mg/dL — ABNORMAL HIGH (ref 8–23)
CO2: 33 mmol/L — ABNORMAL HIGH (ref 22–32)
Calcium: 9 mg/dL (ref 8.9–10.3)
Chloride: 88 mmol/L — ABNORMAL LOW (ref 98–111)
Creatinine, Ser: 1.09 mg/dL — ABNORMAL HIGH (ref 0.44–1.00)
GFR, Estimated: 58 mL/min — ABNORMAL LOW (ref >60)
Glucose, Bld: 123 mg/dL — ABNORMAL HIGH (ref 70–99)
Potassium: 5.3 mmol/L — ABNORMAL HIGH (ref 3.5–5.1)
Sodium: 135 mmol/L (ref 135–145)

## 2022-06-02 LAB — GLUCOSE, CAPILLARY
Glucose-Capillary: 135 mg/dL — ABNORMAL HIGH (ref 70–99)
Glucose-Capillary: 164 mg/dL — ABNORMAL HIGH (ref 70–99)
Glucose-Capillary: 276 mg/dL — ABNORMAL HIGH (ref 70–99)
Glucose-Capillary: 199 mg/dL — ABNORMAL HIGH (ref 70–99)

## 2022-06-02 LAB — POTASSIUM
Potassium: 2.7 mmol/L — CL (ref 3.5–5.1)
Potassium: 2.9 mmol/L — ABNORMAL LOW (ref 3.5–5.1)

## 2022-06-02 LAB — CYTOLOGY - NON PAP

## 2022-06-02 LAB — TRIGLYCERIDES, BODY FLUIDS: Triglycerides, Fluid: 992 mg/dL

## 2022-06-02 LAB — MAGNESIUM: Magnesium: 1.9 mg/dL (ref 1.7–2.4)

## 2022-06-02 MED ORDER — POTASSIUM CHLORIDE 10 MEQ/100ML IV SOLN
10.0000 meq | INTRAVENOUS | Status: AC
  Administered 2022-06-02 – 2022-06-03 (×2): 10 meq via INTRAVENOUS
  Filled 2022-06-02 (×2): qty 100

## 2022-06-02 MED ORDER — POTASSIUM CHLORIDE 10 MEQ/100ML IV SOLN: 10.0000 meq | INTRAVENOUS | Status: DC

## 2022-06-02 MED ORDER — IOHEXOL 300 MG/ML  SOLN
75.0000 mL | Freq: Once | INTRAMUSCULAR | Status: AC | PRN
  Administered 2022-06-02: 75 mL via INTRAVENOUS

## 2022-06-02 MED ORDER — ACETAMINOPHEN 325 MG PO TABS
650.0000 mg | ORAL_TABLET | Freq: Four times a day (QID) | ORAL | Status: DC | PRN
  Administered 2022-06-02: 650 mg via ORAL
  Filled 2022-06-02: qty 2

## 2022-06-02 MED ORDER — POTASSIUM CHLORIDE CRYS ER 20 MEQ PO TBCR: 40.0000 meq | EXTENDED_RELEASE_TABLET | Freq: Once | ORAL | Status: DC

## 2022-06-02 MED ORDER — OCTREOTIDE ACETATE 50 MCG/ML IJ SOLN
50.0000 ug | Freq: Three times a day (TID) | INTRAMUSCULAR | Status: DC
  Administered 2022-06-02 – 2022-06-08 (×17): 50 ug via SUBCUTANEOUS
  Filled 2022-06-02 (×22): qty 1

## 2022-06-02 MED ORDER — POTASSIUM CHLORIDE CRYS ER 20 MEQ PO TBCR
40.0000 meq | EXTENDED_RELEASE_TABLET | Freq: Once | ORAL | Status: AC
  Administered 2022-06-02: 40 meq via ORAL
  Filled 2022-06-02: qty 2

## 2022-06-02 MED ORDER — SODIUM CHLORIDE (PF) 0.9 % IJ SOLN
INTRAMUSCULAR | Status: AC
  Filled 2022-06-02: qty 50

## 2022-06-02 NOTE — Consult Note (Signed)
NAME:  Alyssa Rhodes, MRN:  [id number], DOB:  1961/01/15, LOS: 71 ADMISSION DATE:  05/23/2022, CONSULTATION DATE:  06/01/22 REFERRING MD:  Lonny Prude CHIEF COMPLAINT:  Abd pain   History of Present Illness:  DOMINQUE Rhodes is a 61 y.o. female who has a PMH as below including but not limited to RA (was on MTX but recently taken off by rheumatology on 10/24), DM, HTN, HLD, anasarca of unclear etiology. She presented to [facility] 10/30 with RUQ pain, N/V, LE edema. She was recently started on Torsemide for LE edema and volume overload. Abd pain has been chronic but had gotten worse. In fact, she was seen at [facility] for the same and was diagnosed with biliary dyskinesia.  She was evaluated by surgery who felt that gallbladder was not part of her issue (Had HIDA 9/18 with EF18% and patent cystic duct and CBD). She was evaluated by cardiology who did not feel that her anasarca was due to cardiac issues (echo 03/15/22 with EF 60-65%, no RWMA, small effusion. LE duplex also negative for DVT). They recommended heme-onc consult for possible bone marrow biopsy. Surgery has mentioned some concern for lymphatic disease causing her anasarca and chylothoraces (see below).  During this admission, she was found to have bilateral pleural effusions. She had right thoracentesis 10/31 by IR which yielded 1.3L of chylous appearing fluid. She again had 2nd right thora on 11/8 that again yielded 850cc of chylous appearing fluid. Cytology from first thora showed reactive mesothelial cells. Of note, no triglycerides or cholesterol was sent on pleural fluid.  She is being managed by [facility]; however, due to her suspected chylothoraces, PCCM asked to see in consultation. Per last TRH note from 11/7, discussion with rheumatology was had and they did not have any rheumatologic reason to explain her anasarca either. 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); Bilateral leg pain; and Hyperkalemia on their problem list.  Significant Hospital Events: Including procedures, antibiotic start and stop dates in addition to other pertinent events   10/30 admit. 10/31 right thora by IR which yielded 1.3L of chylous fluid. 11/8 2nd right thora by IR which yielded 850cc of chylous fluid. 11/8 PCCM consult.  Interim History / Subjective:   Doing well overall, some left flank pain vs chest tightness. Winded with exertion.  Objective:  Blood pressure (!) 146/82, pulse 94, temperature 97.7 F (36.5 C), temperature source Oral, resp. rate 16, height _0  (1.549 m), weight 60.3 kg, SpO2 95 %.        Intake/Output Summary (Last 24 hours) at 06/02/2022 1425 Last data filed at 06/02/2022 0730 Gross per 24 hour  Intake 240 ml  Output 1925 ml  Net -1685 ml   Filed Weights   05/28/22 0933 05/29/22 0619 05/30/22 0603  Weight: 61 kg 59.9 kg 60.3 kg    Examination: General appearance: 61 y.o., female, NAD, conversant  Eyes:tracking appropriately HENT: NCAT; MMM Lungs: CTAB, no crackles, no wheeze, with normal respiratory effort CV: RRR, no murmur  Abdomen: Soft, non-tender; non-distended, BS present  Extremities: 1-2+ peripheral edema, warm Neuro: Alert and oriented  to person and place, no focal deficit    CT Chest 06/02/22 reviewed by me with increased size of left effusion, decrease in size of right effusion  Assessment & Plan:   Right chylothorax Left pleural effusion s/p thora x 2  on 10/31 and 11/8 both with chylous fluid obtained, TG 11/8 was 992! Unclear etiology at this point but potentially related to her RA. No recent surgeries to raise possibility of thoracic duct injury etc.  - RD has been consulted for dietary modification - recommend IR consult for dx/tx LEFT thoracentesis, please send TG, cholesterol in addition to usual studies including cytology - upep - if recurrent warranting drainage despite above measures and related to RA I wonder if she may need additional immunosuppression alternatively consider workup with IR for lymphoscintigraphy  Best practice (evaluated daily):  Per triad  [place]

## 2022-06-02 NOTE — TOC Progression Note (Signed)
Transition of Care [facility]) - Progression Note    Patient Details  Name: Alyssa Rhodes MRN: [id number] Date of Birth: 10/23/1960  Transition of Care [facility]) CM/SW [place], RN Phone Number:[phone]  06/02/2022, 8:54 AM  Clinical Narrative:    Patient continues to refuse all home health including RN for disease management. Patient states that she is planning to return to work and will not be home for any home health services.    Expected Discharge Plan: Home/Self Care Barriers to Discharge: Continued Medical Work up  Expected Discharge Plan and Services Expected Discharge Plan: Home/Self Care In-house Referral: NA Discharge Planning Services: CM Consult Post Acute Care Choice: NA Living arrangements for the past 2 months: Single Family Home                 DME Arranged: Patient refused services         HH Arranged: Patient Refused [place] Agency: NA         Social Determinants of Health (SDOH) Interventions    Readmission Risk Interventions    05/27/2022   12:02 PM 04/11/2022   11:35 AM  Readmission Risk Prevention Plan  Transportation Screening Complete Complete  PCP or Specialist Appt within 5-7 Days  Complete  PCP or Specialist Appt within 3-5 Days Complete   Home Care Screening  Complete  Medication Review (RN CM)  Complete  HRI or Home Care Consult Complete   Social Work Consult for [place] Planning/Counseling Complete   Palliative Care Screening Not Applicable   Medication Review [employer]) Referral to Pharmacy

## 2022-06-02 NOTE — Assessment & Plan Note (Addendum)
Appears to likely have been erroneous.

## 2022-06-02 NOTE — Progress Notes (Signed)
PROGRESS NOTE    Alyssa Rhodes  YHC:[id number] DOB: 08-14-1960 DOA: 05/23/2022 PCP: Doretha Sou, MD   Brief Narrative: Alyssa Rhodes is a 61 y.o. female with a history of rheumatoid arthritis, diabetes mellitus type 2, hyperlipidemia, hypertension. Patient presented secondary to right sided abdominal pain. Her pain was managed conservatively but she also had significant anasarca requiring IV diuresis with improvement of edema. During treatment, patient appears to have suffered possible ischemic injury to liver as evident by significantly elevated AST/ALT which is now improving. 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 Low fat diet. Pulmonology consulted.   Assessment and Plan: * Anasarca Chronic. Unclear etiology. Patient evaluated by cardiology with concern for non-cardiogenic etiology. Patient managed with Lasix IV, zaroxolyn and albumin with improvement of edema. Weight on admission of 67 kg (147.71 lbs) with most recent weight of 60.3 kg (133 lbs) on 11/6. Lasix IV transitioned to torsemide PO. 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 General surgery consulted while inpatient. HIDA scan noted to be significant for reduced EF and patent cystic and common bile ducts. Patient without indication for inpatient cholecystectomy, so procedure deferred to outpatient. Abdominal pain appears to be improved from previous.  Hyperkalemia Mild and likely secondary to potassium supplementation. -Recheck serum potassium  Bilateral leg pain -Continue gabapentin  ACTH deficiency (HCC) Unclear etiology, although patient does have an incidental right adrenal nodularity likely to be an underlying adenoma. Normal cortisol level with low ACTH level of 3.  Normal TSH. Patient will need endocrinology follow-up as an outpatient.  Pericardial effusion Noted one cho from 02/2022.  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 Pleural fluid triglyceride level of 992. Patient has received two thoracentesis, both with milky fluid consistent with chylothorax. No trauma or recent surgeries. No known malignancy. Recent CT abdomen/pelvis without contrast unremarkable for etiology. Diet adjusted. CT chest without etiology for chylothorax. Pulmonology consulted on 11/8. -Pleural fluid cholesterol pending -Pulmonology recommendations: pending today  Protein-calorie malnutrition, severe Dietitian recommendations (11/8): Restricted fat diet to no more than 10g fat per day Reviewed with patient MD to order MCT oil, 5 ml TID then increase by 5 ml every day to recommended dose of 20 ml TID (provides ~460 fat kcals) Encouraged pt to continue protein supplements: Prosource Plus PO TID, each provides 100 kcals and 15g protein (0g fat) Boost Breeze po TID, each supplement provides 250 kcal and 9 grams of protein (0g fat) D/c Ensure Max -pt request  Transaminitis Severely elevated AST/ALT up to 2,146/1,035. Presumed ischemic per GI discussion this admission. AST/ALT trending down.  Rheumatoid arthritis ([place]) Patient previously on methotrexate and was discontinued for plan for cholecystectomy.  Type 2 diabetes mellitus with complication, without long-term current use of insulin (HCC) Most recent hemoglobin A1C of 7.5%. Uncontrolled with hyperglycemia. -Continue SSI  HTN (hypertension) Mostly normotensive.  Hypokalemia-resolved as of 06/02/2022 Potassium supplementation given with resolution.    DVT prophylaxis: Lovenox Code Status:   Code Status: Full Code Family Communication: None at bedside Disposition Plan: Discharge home pending workup/management of chylothorax   Consultants:  General surgery Pulmonology  Procedures:   Thoracentesis  Antimicrobials: None    Subjective: Breathing is good today. No chest pain.  Objective: BP 108/81 (BP Location: Left Arm)   Pulse 85   Temp (!) 97.5 F (36.4 C) (Oral)  Resp 14   Ht '5\' 1"'$  (1.549 m)   Wt 60.3 kg   SpO2 95%   BMI 25.13 kg/m   Examination:  General exam: Appears calm and comfortable Respiratory system: Respiratory effort normal. Cardiovascular system: S1 & S2 heard, RRR. Gastrointestinal system: Abdomen is nondistended, soft and nontender. Normal bowel sounds heard. Central nervous system: Alert and oriented. No focal neurological deficits. Musculoskeletal: BLE pitting edema. No calf tenderness Skin: No cyanosis. No rashes Psychiatry: Judgement and insight appear normal. Mood & affect appropriate.    Data Reviewed: I have personally reviewed following labs and imaging studies  CBC Lab Results  Component Value Date   WBC 5.0 05/29/2022   RBC 3.82 (L) 05/29/2022   HGB 12.2 05/29/2022   HCT 37.7 05/29/2022   MCV 98.7 05/29/2022   MCH 31.9 05/29/2022   PLT 592 (H) 05/29/2022   MCHC 32.4 05/29/2022   RDW 14.1 05/29/2022   LYMPHSABS 0.9 05/29/2022   MONOABS 0.4 05/29/2022   EOSABS 0.2 05/29/2022   BASOSABS 0.0 83/66/2947     Last metabolic panel Lab Results  Component Value Date   NA 135 06/02/2022   K 5.3 (H) 06/02/2022   CL 88 (L) 06/02/2022   CO2 33 (H) 06/02/2022   BUN 41 (H) 06/02/2022   CREATININE 1.09 (H) 06/02/2022   GLUCOSE 123 (H) 06/02/2022   GFRNONAA 58 (L) 06/02/2022   GFRAA >60 12/17/2019   CALCIUM 9.0 06/02/2022   PHOS 3.7 05/24/2022   PROT 6.5 06/01/2022   ALBUMIN 3.5 06/01/2022   LABGLOB 2.8 03/17/2022   AGRATIO 1.1 03/17/2022   BILITOT 0.5 06/01/2022   ALKPHOS 93 06/01/2022   AST 53 (H) 06/01/2022   ALT 105 (H) 06/01/2022   ANIONGAP 14 06/02/2022    GFR: Estimated Creatinine Clearance: 45.2 mL/min (A) (by C-G formula based on SCr of 1.09 mg/dL (H)).  Recent Results (from the past 240 hour(s))   Acid Fast Smear (AFB)     Status: None   Collection Time: 05/24/22  3:55 PM   Specimen: PATH Cytology Pleural fluid  Result Value Ref Range Status   AFB Specimen Processing Concentration  Final   Acid Fast Smear Negative  Final    Comment: (NOTE) Performed At: [facility] [place], [place] [id number] Rush Farmer MD [id number]    Source (AFB) PLEURAL  Final    Comment: Performed at [facility], [place] [address], [place], [place] [address]  Body fluid culture w Gram Stain     Status: None   Collection Time: 05/24/22  3:55 PM   Specimen: PATH Cytology Pleural fluid  Result Value Ref Range Status   Specimen Description   Final    PLEURAL Performed at [facility] [address], [place], [place] [address]    Special Requests   Final    NONE Performed at [facility], [place] [address], [place], [place] [address]    Gram Stain   Final    RARE WBC PRESENT, PREDOMINANTLY MONONUCLEAR NO ORGANISMS SEEN    Culture   Final    NO GROWTH 3 DAYS Performed at [facility], [place] [address] [place], [place] [address]    Report Status 05/28/2022 FINAL  Final  Body fluid culture w Gram Stain     Status: None (Preliminary result)   Collection Time: 06/01/22 11:17 AM   Specimen: PATH Cytology Pleural fluid  Result Value Ref Range Status   Specimen Description  Final    PLEURAL Performed at [facility], [place] [address] [place], [place] [address]    Special Requests   Final    NONE Performed at [facility], [place] [address], [place], [place] [address]    Gram Stain NO WBC SEEN NO ORGANISMS SEEN   Final   Culture   Final    NO GROWTH < 24 HOURS Performed at [facility], [place] [address] [place], [place] [address]    Report Status PENDING  Incomplete      Radiology Studies: CT CHEST W CONTRAST  Result Date: 06/02/2022 CLINICAL DATA:  Pleural  effusion EXAM: CT CHEST WITH CONTRAST TECHNIQUE: Multidetector CT imaging of the chest was performed during intravenous contrast administration. RADIATION DOSE REDUCTION: This exam was performed according to the departmental dose-optimization program which includes automated exposure control, adjustment of the mA and/or kV according to patient size and/or use of iterative reconstruction technique. CONTRAST:  36m OMNIPAQUE IOHEXOL 300 MG/ML  SOLN COMPARISON:  Previous CT done on 03/17/2022 and chest radiograph done on 06/01/2022 FINDINGS: Cardiovascular: No acute findings are seen. There is homogeneous enhancement in thoracic aorta. There are no filling defects in central pulmonary artery branches. Mediastinum/Nodes: Subcentimeter nodes in mediastinum appear stable. Lungs/Pleura: There is interval decrease in right pleural effusion and increase in left pleural effusion. There is no pneumothorax. Pleural densities in both apices appear stable. Infiltrate in left lower lobe may suggest atelectasis/pneumonia. Increased interstitial markings are seen in the posterior right lower lung field. Centrilobular emphysema is seen. Upper Abdomen: There is 1.7 cm nodule in right adrenal with no significant change, possibly adenoma. No follow-up imaging is recommended. Musculoskeletal: No acute findings are seen. There is diffuse edema in subcutaneous plane suggesting anasarca. IMPRESSION: There is interval decrease in right pleural effusion and interval increase in left pleural effusion. There is no pneumothorax. Patchy infiltrates are noted in right lower lung fields. There is dense infiltrate in left lower lung fields. Findings suggest atelectasis/pneumonia in both lower lung fields. There is no evidence of thoracic aortic dissection. There is no evidence of central pulmonary artery embolism. Other findings as described in the body of the report. Electronically Signed   By: PElmer PickerM.D.   On: 06/02/2022 09:13   [place] THORACENTESIS ASP PLEURAL SPACE W/IMG GUIDE  Result Date: 06/01/2022 INDICATION: Patient with history of RA, DM type 2, HTN and recurrent right pleural effusion. Request received for diagnostic and therapeutic right thoracentesis. EXAM: ULTRASOUND GUIDED DIAGNOSTIC AND THERAPEUTIC RIGHT THORACENTESIS MEDICATIONS: 10 mL 1 % lidocaine COMPLICATIONS: None immediate. PROCEDURE: An ultrasound guided thoracentesis was thoroughly discussed with the patient and questions answered. The benefits, risks, alternatives and complications were also discussed. The patient understands and wishes to proceed with the procedure. Written consent was obtained. Ultrasound was performed to localize and mark an adequate pocket of fluid in the right chest. The area was then prepped and draped in the normal sterile fashion. 1% Lidocaine was used for local anesthesia. Under ultrasound guidance a 6 Fr Safe-T-Centesis catheter was introduced. Thoracentesis was performed. The catheter was removed and a dressing applied. FINDINGS: A total of approximately 850 cc of chylous fluid was removed. Samples were sent to the laboratory as requested by the clinical team. IMPRESSION: Successful ultrasound guided right thoracentesis yielding 850 cc of pleural fluid. Read by: SNarda Rutherford AGNP-BC Electronically Signed   By: DRuthann CancerM.D.   On: 06/01/2022 12:03   DG CHEST PORT 1 VIEW  Result Date: 06/01/2022 CLINICAL DATA:  Post right thoracentesis EXAM: PORTABLE CHEST 1 VIEW COMPARISON:  Chest 05/30/2022 FINDINGS: Near complete clearing of right pleural effusion. Improved aeration right lung base. No pneumothorax. No change in left pleural effusion and left lower lobe atelectasis. Negative for heart failure. IMPRESSION: 1. Near complete clearing of right pleural effusion following thoracentesis. No pneumothorax. 2. Left pleural effusion and left lower lobe atelectasis unchanged. Electronically Signed   By: Franchot Gallo M.D.   On: 06/01/2022 11:25    MR BRAIN W WO CONTRAST  Result Date: 05/31/2022 EXAM: MRI HEAD WITHOUT AND WITH CONTRAST TECHNIQUE: Multiplanar, multiecho pulse sequences of the brain and surrounding structures were obtained without and with intravenous contrast. CONTRAST:  31m GADAVIST GADOBUTROL 1 MMOL/ML IV SOLN COMPARISON:  None Available. FINDINGS: Brain: No acute infarction, hemorrhage, hydrocephalus, extra-axial collection or mass lesion. Scattered and confluent foci of T2 hyperintensity are seen within the white the cerebral hemispheres, nonspecific. No focus of abnormal contrast enhancement identified. Vascular: Normal flow voids. Skull and upper cervical spine: Normal marrow signal. Sinuses/Orbits: Negative. Other: None. IMPRESSION: 1. Mild-to-moderate amount of T2 hyperintense lesions of the white matter in a nonspecific distribution. Differential diagnosis include chronic microangiopathy, demyelinating disease, post inflammatory/infectious processes and other autoimmune diseases. 2. No pituitary tumor identified in this routine protocol brain MRI. Electronically Signed   By: KPedro EarlsM.D.   On: 05/31/2022 16:27      LOS: 10 days    RCordelia Poche MD [facility] 06/02/2022, 12:57 PM   If 7PM-7AM, please contact night-coverage [email or web address]

## 2022-06-02 NOTE — Progress Notes (Signed)
Nutrition Note  Spoke with pt at bedside. Pt reports she is trying to stick with dietary guidelines.  Per review of her breakfast (rice krispies with FF milk, orange). She is following low fat guidelines well. Answer pt's questions. Will leave protein supplementation as is, pt is accepting. Continue MCT oil.   Will continue to monitor.  Clayton Bibles, MS, RD, LDN Inpatient Clinical Dietitian Contact information available via Amion

## 2022-06-02 NOTE — Progress Notes (Signed)
Mobility Specialist - Progress Note   06/02/22 1151  Mobility  Activity Ambulated independently in hallway  Level of Assistance Independent  Assistive Device None  Distance Ambulated (ft) 500 ft  Activity Response Tolerated well  Mobility Referral Yes  $Mobility charge 1 Mobility   Pt received in bed and agreeable to mobility. No complaints during mobility. Pt to recliner after session with all needs met.     [facility]

## 2022-06-03 ENCOUNTER — Inpatient Hospital Stay (HOSPITAL_COMMUNITY): Payer: BC Managed Care – PPO

## 2022-06-03 DIAGNOSIS — J940 Chylous effusion: Secondary | ICD-10-CM | POA: Diagnosis not present

## 2022-06-03 DIAGNOSIS — R601 Generalized edema: Secondary | ICD-10-CM

## 2022-06-03 DIAGNOSIS — R112 Nausea with vomiting, unspecified: Secondary | ICD-10-CM

## 2022-06-03 DIAGNOSIS — E230 Hypopituitarism: Secondary | ICD-10-CM | POA: Diagnosis not present

## 2022-06-03 DIAGNOSIS — M79604 Pain in right leg: Secondary | ICD-10-CM | POA: Diagnosis not present

## 2022-06-03 LAB — CBC
HCT: 43.6 % (ref 36.0–46.0)
Hemoglobin: 14.4 g/dL (ref 12.0–15.0)
MCH: 32.3 pg (ref 26.0–34.0)
MCHC: 33 g/dL (ref 30.0–36.0)
MCV: 97.8 fL (ref 80.0–100.0)
Platelets: 539 10*3/uL — ABNORMAL HIGH (ref 150–400)
RBC: 4.46 MIL/uL (ref 3.87–5.11)
RDW: 13.6 % (ref 11.5–15.5)
WBC: 5.6 10*3/uL (ref 4.0–10.5)
nRBC: 0 % (ref 0.0–0.2)

## 2022-06-03 LAB — BASIC METABOLIC PANEL
Anion gap: 13 (ref 5–15)
BUN: 38 mg/dL — ABNORMAL HIGH (ref 8–23)
CO2: 36 mmol/L — ABNORMAL HIGH (ref 22–32)
Calcium: 9.5 mg/dL (ref 8.9–10.3)
Chloride: 84 mmol/L — ABNORMAL LOW (ref 98–111)
Creatinine, Ser: 1.22 mg/dL — ABNORMAL HIGH (ref 0.44–1.00)
GFR, Estimated: 50 mL/min — ABNORMAL LOW (ref >60)
Glucose, Bld: 144 mg/dL — ABNORMAL HIGH (ref 70–99)
Potassium: 3.9 mmol/L (ref 3.5–5.1)
Sodium: 133 mmol/L — ABNORMAL LOW (ref 135–145)

## 2022-06-03 LAB — BODY FLUID CELL COUNT WITH DIFFERENTIAL
Eos, Fluid: 0 %
Lymphs, Fluid: 84 %
Monocyte-Macrophage-Serous Fluid: 16 % — ABNORMAL LOW (ref 50–90)
Neutrophil Count, Fluid: 0 % (ref 0–25)
Total Nucleated Cell Count, Fluid: 571 cu mm (ref 0–1000)

## 2022-06-03 LAB — GRAM STAIN

## 2022-06-03 LAB — LACTATE DEHYDROGENASE, PLEURAL OR PERITONEAL FLUID: LD, Fluid: 66 U/L — ABNORMAL HIGH (ref 3–23)

## 2022-06-03 LAB — GLUCOSE, CAPILLARY
Glucose-Capillary: 131 mg/dL — ABNORMAL HIGH (ref 70–99)
Glucose-Capillary: 155 mg/dL — ABNORMAL HIGH (ref 70–99)
Glucose-Capillary: 161 mg/dL — ABNORMAL HIGH (ref 70–99)
Glucose-Capillary: 208 mg/dL — ABNORMAL HIGH (ref 70–99)
Glucose-Capillary: 195 mg/dL — ABNORMAL HIGH (ref 70–99)

## 2022-06-03 MED ORDER — FUROSEMIDE 10 MG/ML IJ SOLN
40.0000 mg | Freq: Once | INTRAMUSCULAR | Status: AC
  Administered 2022-06-03: 40 mg via INTRAVENOUS
  Filled 2022-06-03: qty 4

## 2022-06-03 MED ORDER — POTASSIUM CHLORIDE CRYS ER 20 MEQ PO TBCR
40.0000 meq | EXTENDED_RELEASE_TABLET | Freq: Once | ORAL | Status: AC
  Administered 2022-06-03: 40 meq via ORAL
  Filled 2022-06-03: qty 2

## 2022-06-03 MED ORDER — LIDOCAINE HCL 1 % IJ SOLN
INTRAMUSCULAR | Status: AC
  Filled 2022-06-03: qty 20

## 2022-06-03 MED ORDER — TORSEMIDE 20 MG PO TABS
40.0000 mg | ORAL_TABLET | Freq: Two times a day (BID) | ORAL | Status: DC
  Administered 2022-06-04 – 2022-06-06 (×5): 40 mg via ORAL
  Filled 2022-06-03 (×5): qty 2

## 2022-06-03 MED ORDER — LIDOCAINE HCL 1 % IJ SOLN
INTRAMUSCULAR | Status: AC
  Administered 2022-06-03: 15 mL
  Filled 2022-06-03: qty 20

## 2022-06-03 NOTE — Progress Notes (Signed)
PROGRESS NOTE    Alyssa Rhodes  QBH:[id number] DOB: 07-07-1961 DOA: 05/23/2022 PCP: Doretha Sou, MD   Brief Narrative: Alyssa Rhodes is a 61 y.o. female with a history of rheumatoid arthritis, diabetes mellitus type 2, hyperlipidemia, hypertension. Patient presented secondary to right sided abdominal pain. Her pain was managed conservatively but she also had significant anasarca requiring IV diuresis with improvement of edema. During treatment, patient appears to have suffered possible ischemic injury to liver as evident by significantly elevated AST/ALT which is now improving. 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 Low fat diet. Pulmonology consulted.   Assessment and Plan: * Anasarca Chronic. Unclear etiology. Patient evaluated by cardiology with concern for non-cardiogenic etiology. Patient managed with Lasix IV, zaroxolyn and albumin with improvement of edema. 24 urine undetectable. Weight on admission of 67 kg (147.71 lbs) with weight of 59 kg (130.07 lbs) today. Lasix IV transitioned to torsemide PO. 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 General surgery consulted while inpatient. HIDA scan noted to be significant for reduced EF and patent cystic and common bile ducts. Patient without indication for inpatient cholecystectomy, so procedure deferred to outpatient. Abdominal pain appears to be improved from previous.  Bilateral leg pain -Continue gabapentin  ACTH deficiency (HCC) Unclear etiology, although patient does have an incidental right adrenal nodularity likely to be an underlying adenoma. Normal cortisol level with low ACTH level of 3. Normal TSH. Patient will need  endocrinology follow-up as an outpatient.  Pericardial effusion Noted one cho from 02/2022.  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 Pleural fluid triglyceride level of 992. Patient has received two thoracentesis, both with milky fluid consistent with chylothorax. No trauma or recent surgeries. No known malignancy. Recent CT abdomen/pelvis without contrast unremarkable for etiology. Diet adjusted. CT chest without etiology for chylothorax. Pulmonology consulted on 11/8. -Pleural fluid cholesterol pending -Pulmonology recommendations: awaiting left thoracentesis/labs  Protein-calorie malnutrition, severe Dietitian recommendations (11/8): Restricted fat diet to no more than 10g fat per day Reviewed with patient MD to order MCT oil, 5 ml TID then increase by 5 ml every day to recommended dose of 20 ml TID (provides ~460 fat kcals) Encouraged pt to continue protein supplements: Prosource Plus PO TID, each provides 100 kcals and 15g protein (0g fat) Boost Breeze po TID, each supplement provides 250 kcal and 9 grams of protein (0g fat) D/c Ensure Max -pt request  Transaminitis Severely elevated AST/ALT up to 2,146/1,035. Presumed ischemic per GI discussion this admission. AST/ALT trending down.  Hypokalemia Likely related to diuresis. -Continue potassium supplementation as needed  Rheumatoid arthritis ([place]) Patient previously on methotrexate and was discontinued for plan for cholecystectomy.  Type 2 diabetes mellitus with complication, without long-term current use of insulin (HCC) Most recent hemoglobin A1C of 7.5%. Uncontrolled with hyperglycemia. -Continue SSI  HTN (hypertension) Mostly normotensive.  Hyperkalemia-resolved as of 06/03/2022 Appears to likely have been erroneous.     DVT prophylaxis: Lovenox Code Status:   Code Status: Full Code Family Communication: None at bedside Disposition Plan: Discharge home pending workup/management of  chylothorax   Consultants:  General surgery Pulmonology  Procedures:  Thoracentesis  Antimicrobials: None    Subjective: Patient reports some mildly increased BLE edema. Breathing well. No distress.  Objective: BP 126/77 (BP Location: Left  Arm)   Pulse 88   Temp 98.4 F (36.9 C) (Oral)   Resp 14   Ht '5\' 1"'$  (1.549 m)   Wt 60.3 kg   SpO2 93%   BMI 25.13 kg/m   Examination:  General exam: Appears calm and comfortable Respiratory system: Diminished on auscultation. Respiratory effort normal. Cardiovascular system: S1 & S2 heard, RRR. Gastrointestinal system: Abdomen is nondistended, soft and nontender. Normal bowel sounds heard. Central nervous system: Alert and oriented. No focal neurological deficits. Musculoskeletal: BLE 2+ pitting edema. No calf tenderness Skin: No cyanosis. No rashes Psychiatry: Judgement and insight appear normal. Mood & affect appropriate.    Data Reviewed: I have personally reviewed following labs and imaging studies  CBC Lab Results  Component Value Date   WBC 5.6 06/03/2022   RBC 4.46 06/03/2022   HGB 14.4 06/03/2022   HCT 43.6 06/03/2022   MCV 97.8 06/03/2022   MCH 32.3 06/03/2022   PLT 539 (H) 06/03/2022   MCHC 33.0 06/03/2022   RDW 13.6 06/03/2022   LYMPHSABS 0.9 05/29/2022   MONOABS 0.4 05/29/2022   EOSABS 0.2 05/29/2022   BASOSABS 0.0 41/93/7902     Last metabolic panel Lab Results  Component Value Date   NA 133 (L) 06/03/2022   K 3.9 06/03/2022   CL 84 (L) 06/03/2022   CO2 36 (H) 06/03/2022   BUN 38 (H) 06/03/2022   CREATININE 1.22 (H) 06/03/2022   GLUCOSE 144 (H) 06/03/2022   GFRNONAA 50 (L) 06/03/2022   GFRAA >60 12/17/2019   CALCIUM 9.5 06/03/2022   PHOS 3.7 05/24/2022   PROT 6.5 06/01/2022   ALBUMIN 3.5 06/01/2022   LABGLOB 2.8 03/17/2022   AGRATIO 1.1 03/17/2022   BILITOT 0.5 06/01/2022   ALKPHOS 93 06/01/2022   AST 53 (H) 06/01/2022   ALT 105 (H) 06/01/2022   ANIONGAP 13 06/03/2022     GFR: Estimated Creatinine Clearance: 40.4 mL/min (A) (by C-G formula based on SCr of 1.22 mg/dL (H)).  Recent Results (from the past 240 hour(s))  Acid Fast Smear (AFB)     Status: None   Collection Time: 05/24/22  3:55 PM   Specimen: PATH Cytology Pleural fluid  Result Value Ref Range Status   AFB Specimen Processing Concentration  Final   Acid Fast Smear Negative  Final    Comment: (NOTE) Performed At: [facility] [place], [place] [id number] Rush Farmer MD [id number]    Source (AFB) PLEURAL  Final    Comment: Performed at [facility], [place] [address] [place], [place] [address]  Body fluid culture w Gram Stain     Status: None   Collection Time: 05/24/22  3:55 PM   Specimen: PATH Cytology Pleural fluid  Result Value Ref Range Status   Specimen Description   Final    PLEURAL Performed at [facility] [address], [place], [place] [address]    Special Requests   Final    NONE Performed at [facility], [place] [address], [place], [place] [address]    Gram Stain   Final    RARE WBC PRESENT, PREDOMINANTLY MONONUCLEAR NO ORGANISMS SEEN    Culture   Final    NO GROWTH 3 DAYS Performed at [facility], [place] [address] [place], [place] [address]    Report Status 05/28/2022 FINAL  Final  Body fluid culture w Gram Stain     Status: None (Preliminary result)   Collection Time: 06/01/22 11:17 AM  Specimen: PATH Cytology Pleural fluid  Result Value Ref Range Status   Specimen Description   Final    PLEURAL Performed at [facility] [address], [place], [place] [address]    Special Requests   Final    NONE Performed at [facility], [place] [address] [place], [place] [address]    Gram Stain NO WBC SEEN NO ORGANISMS SEEN   Final   Culture   Final    NO GROWTH 2 DAYS Performed at [facility], [place] [address] [place], [place]  [address]    Report Status PENDING  Incomplete      Radiology Studies: CT CHEST W CONTRAST  Result Date: 06/02/2022 CLINICAL DATA:  Pleural effusion EXAM: CT CHEST WITH CONTRAST TECHNIQUE: Multidetector CT imaging of the chest was performed during intravenous contrast administration. RADIATION DOSE REDUCTION: This exam was performed according to the departmental dose-optimization program which includes automated exposure control, adjustment of the mA and/or kV according to patient size and/or use of iterative reconstruction technique. CONTRAST:  59m OMNIPAQUE IOHEXOL 300 MG/ML  SOLN COMPARISON:  Previous CT done on 03/17/2022 and chest radiograph done on 06/01/2022 FINDINGS: Cardiovascular: No acute findings are seen. There is homogeneous enhancement in thoracic aorta. There are no filling defects in central pulmonary artery branches. Mediastinum/Nodes: Subcentimeter nodes in mediastinum appear stable. Lungs/Pleura: There is interval decrease in right pleural effusion and increase in left pleural effusion. There is no pneumothorax. Pleural densities in both apices appear stable. Infiltrate in left lower lobe may suggest atelectasis/pneumonia. Increased interstitial markings are seen in the posterior right lower lung field. Centrilobular emphysema is seen. Upper Abdomen: There is 1.7 cm nodule in right adrenal with no significant change, possibly adenoma. No follow-up imaging is recommended. Musculoskeletal: No acute findings are seen. There is diffuse edema in subcutaneous plane suggesting anasarca. IMPRESSION: There is interval decrease in right pleural effusion and interval increase in left pleural effusion. There is no pneumothorax. Patchy infiltrates are noted in right lower lung fields. There is dense infiltrate in left lower lung fields. Findings suggest atelectasis/pneumonia in both lower lung fields. There is no evidence of thoracic aortic dissection. There is no evidence of central pulmonary artery  embolism. Other findings as described in the body of the report. Electronically Signed   By: PElmer PickerM.D.   On: 06/02/2022 09:13      LOS: 11 days    RCordelia Poche MD [facility] 06/03/2022, 11:46 AM   If 7PM-7AM, please contact night-coverage [email or web address]

## 2022-06-03 NOTE — Plan of Care (Signed)

## 2022-06-03 NOTE — Procedures (Signed)
Ultrasound-guided diagnostic and therapeutic left thoracentesis performed yielding 420 cc of chylous  fluid. No immediate complications. Follow-up chest x-ray pending. The fluid was sent to the lab for preordered studies. EBL none.

## 2022-06-03 NOTE — Progress Notes (Signed)
NAME:  Alyssa Rhodes, MRN:  [id number], DOB:  1961/01/29, LOS: 37 ADMISSION DATE:  05/23/2022, CONSULTATION DATE:  06/01/22 REFERRING MD:  Lonny Prude CHIEF COMPLAINT:  Abd pain   History of Present Illness:  Alyssa Rhodes is a 61 y.o. female who has a PMH as below including but not limited to RA (was on MTX but recently taken off by rheumatology on 10/24), DM, HTN, HLD, anasarca of unclear etiology. She presented to [facility] 10/30 with RUQ pain, N/V, LE edema. She was recently started on Torsemide for LE edema and volume overload. Abd pain has been chronic but had gotten worse. In fact, she was seen at [facility] for the same and was diagnosed with biliary dyskinesia.  She was evaluated by surgery who felt that gallbladder was not part of her issue (Had HIDA 9/18 with EF18% and patent cystic duct and CBD). She was evaluated by cardiology who did not feel that her anasarca was due to cardiac issues (echo 03/15/22 with EF 60-65%, no RWMA, small effusion. LE duplex also negative for DVT). They recommended heme-onc consult for possible bone marrow biopsy. Surgery has mentioned some concern for lymphatic disease causing her anasarca and chylothoraces (see below).  During this admission, she was found to have bilateral pleural effusions. She had right thoracentesis 10/31 by IR which yielded 1.3L of chylous appearing fluid. She again had 2nd right thora on 11/8 that again yielded 850cc of chylous appearing fluid. Cytology from first thora showed reactive mesothelial cells. Of note, no triglycerides or cholesterol was sent on pleural fluid.  She is being managed by [facility]; however, due to her suspected chylothoraces, PCCM asked to see in consultation. Per last TRH note from 11/7, discussion with rheumatology was had and they did not have any rheumatologic reason to explain her anasarca either. 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  Significant Hospital Events: Including procedures, antibiotic start and stop dates in addition to other pertinent events   10/30 admit. 10/31 right thora by IR which yielded 1.3L of chylous fluid. 11/8 2nd right thora by IR which yielded 850cc of chylous fluid. 11/8 PCCM consult.  Interim History / Subjective:     Objective:  Blood pressure 126/77, pulse 88, temperature 98.4 F (36.9 C), temperature source Oral, resp. rate 14, height _0  (1.549 m), weight 60.3 kg, SpO2 93 %.        Intake/Output Summary (Last 24 hours) at 06/03/2022 1110 Last data filed at 06/03/2022 1020 Gross per 24 hour  Intake 483 ml  Output 2200 ml  Net -1717 ml   Filed Weights   05/28/22 0933 05/29/22 0619 05/30/22 0603  Weight: 61 kg 59.9 kg 60.3 kg    Examination: Gen:      No acute distress HEENT:  EOMI, sclera anicteric Neck:     No masses; no thyromegaly Lungs:    Clear to auscultation bilaterally; normal respiratory effort CV:         Regular rate and rhythm; no murmurs Abd:      + bowel  sounds; soft, non-tender; no palpable masses, no distension Ext:    No edema; adequate peripheral perfusion Skin:      Warm and dry; no rash Neuro: alert and oriented x 3 Psych: normal mood and affect   CT Chest on  06/02/22 shows increased size of left effusion, decrease in size of right effusion  Assessment & Plan:   Right chylothorax Left pleural effusion s/p thora x 2 on  10/31 and 11/8 both with chylous fluid obtained, TG 11/8 was 992! Unclear etiology at this point but potentially related to her RA vthhough this is very rare. No recent surgeries to raise possibility of thoracic duct injury etc.  - RD has been consulted for dietary modification - recommend IR consult for dx/tx LEFT thoracentesis, please send TG, cholesterol in addition to usual studies including cytology - upep  Best practice (evaluated daily):  Per triad  Marshell Garfinkel MD [facility] See Amion for pager  If no response to pager , please call [phone] until 7pm After 7:00 pm call Elink  [phone] 06/03/2022, 11:23 AM

## 2022-06-03 NOTE — Progress Notes (Signed)
Mobility Specialist - Progress Note   06/03/22 1000  Mobility  Activity Ambulated independently in hallway  Level of Assistance Independent  Assistive Device None  Distance Ambulated (ft) 500 ft  Activity Response Tolerated well  Mobility Referral Yes  $Mobility charge 1 Mobility   Pt received in bed and agreeable to mobility. Pt did have some swelling in her feet. Nurse notified. Pt to bed after session with all needs met.      [facility]

## 2022-06-03 NOTE — Progress Notes (Signed)
Chaplain met with Alyssa Rhodes and provided emotional support as she shared about the difficulty of living in the uncertainty of not knowing what is going on with her body.  Chaplain offered prayer and reflective listening.  [address] [place] Pager, [phone]

## 2022-06-03 NOTE — Progress Notes (Signed)
Physical Therapy Treatment Patient Details Name: Alyssa Rhodes MRN: [id number] DOB: 1960/08/05 Today's Date: 06/03/2022   History of Present Illness Alyssa Rhodes is a 61 yo female with PMH RA (previously on MTX, just taken off per rheumatology on 10/24), DMII, HLD, HTN who presented with recurrent right-sided abdominal pain. Of note, she says she does not have Lupus despite showing on her PMH.   This has been an ongoing complaint for several months.  She has gone recent work-up with surgery including multiple imaging studies and HIDA scan performed 04/11/2022.  EF 18% on HIDA scan with patent cystic duct and CBD.     She has also been having extensive generalized edema for several months.  She was evaluated outpatient by general surgery regarding possible cholecystectomy but due to her volume overload, was considered high risk for surgery until edema improved and etiology found.  Echo was performed 03/15/2022 which showed normal EF, 60 to 65%, no RWMA, normal diastolic parameters.  Small pericardial effusion.  She has undergone multiple duplex studies.  All negative for DVT.     She has also followed up with cardiology, recently seen on 04/27/2022.  She has been trialed with compression stockings with minimal effect due to discomfort and being too tight.  Lasix was transitioned to torsemide.  Amlodipine was discontinued altogether in case of contributing.  Her edema is considered noncardiac in etiology.  She was recommended for referral to heme-onc to consider bone marrow biopsy.      She is admitted for her recurrent abdominal pain as noted and further work-up of possible regarding her ongoing edema.  Assessment and Plan:    PT Comments    Pt received supine in bed with no complaints of pain agreeable to mobilize. Pt demonstrated modified independence for bed mobility and transfers, ambulated in hallway with min guard 582f. Pt completed Dynamic Gait Index and scored a 21/24 indicating pt is not a fall risk at  this time. Discussed pt's current level of function and pt reports she is close to her baseline. Pt reports she will be returning to work upon discharge and "will not have time for physical therapy," despite descriptions of HHPT or OPPT. Suggested using a SPC and pt refuses. Updating discharge destination below to no post-acute physical therapy. All education completed and pt has no further questions. Since pt is close to her baseline and refusing post-acute therapy, PT is signing off, should needs change please reconsult. Pt will remain upon mobility caseload. Thank you for this referral.    Recommendations for follow up therapy are one component of a multi-disciplinary discharge planning process, led by the attending physician.  Recommendations may be updated based on patient status, additional functional criteria and insurance authorization.  Follow Up Recommendations  No PT follow up     Assistance Recommended at Discharge Intermittent Supervision/Assistance  Patient can return home with the following A little help with walking and/or transfers;A little help with bathing/dressing/bathroom;Assistance with cooking/housework;Assist for transportation;Help with stairs or ramp for entrance   Equipment Recommendations  None recommended by PT    Recommendations for Other Services       Precautions / Restrictions Precautions Precautions: Fall Precaution Comments: monitor sats Restrictions Weight Bearing Restrictions: No     Mobility  Bed Mobility Overal bed mobility: Modified Independent Bed Mobility: Supine to Sit     Supine to sit: Modified independent (Device/Increase time), HOB elevated Sit to supine: Modified independent (Device/Increase time)   General bed mobility comments: incr time,  no physical assist    Transfers Overall transfer level: Modified independent Equipment used: None Transfers: Sit to/from Stand Sit to Stand: Supervision           General transfer  comment: for safety, incr time.    Ambulation/Gait Ambulation/Gait assistance: Min guard Gait Distance (Feet): 500 Feet Assistive device: None Gait Pattern/deviations: Step-through pattern, Decreased stride length, Wide base of support Gait velocity: decreased     General Gait Details: Pt ambulated 528f with no AD, min guard for safety, no physical assist required or overt LOB noted. Pt with wide BOS with L foot in high degree of ER.   Stairs             Wheelchair Mobility    Modified Rankin (Stroke Patients Only)       Balance Overall balance assessment: Needs assistance Sitting-balance support: Feet supported Sitting balance-Leahy Scale: Good     Standing balance support: No upper extremity supported, During functional activity Standing balance-Leahy Scale: Fair                   Standardized Balance Assessment Standardized Balance Assessment : Dynamic Gait Index   Dynamic Gait Index Level Surface: Normal Change in Gait Speed: Normal Gait with Horizontal Head Turns: Normal Gait with Vertical Head Turns: Normal Gait and Pivot Turn: Normal Step Over Obstacle: Mild Impairment Step Around Obstacles: Mild Impairment Steps: Mild Impairment Total Score: 21      Cognition Arousal/Alertness: Awake/alert Behavior During Therapy: WFL for tasks assessed/performed Overall Cognitive Status: Within Functional Limits for tasks assessed                                          Exercises      General Comments        Pertinent Vitals/Pain Pain Assessment Pain Assessment: No/denies pain    Home Living                          Prior Function            PT Goals (current goals can now be found in the care plan section) Acute Rehab PT Goals Patient Stated Goal: legs to stop hurting/swelling to go down PT Goal Formulation: With patient Time For Goal Achievement: 06/08/22 Potential to Achieve Goals: Good Progress  towards PT goals: Goals met/education completed, patient discharged from PT    Frequency    Min 3X/week      PT Plan Discharge plan needs to be updated    Co-evaluation              AM-PAC PT "6 Clicks" Mobility   Outcome Measure  Help needed turning from your back to your side while in a flat bed without using bedrails?: None Help needed moving from lying on your back to sitting on the side of a flat bed without using bedrails?: A Little Help needed moving to and from a bed to a chair (including a wheelchair)?: A Little Help needed standing up from a chair using your arms (e.g., wheelchair or bedside chair)?: A Little Help needed to walk in hospital room?: A Little Help needed climbing 3-5 steps with a railing? : A Little 6 Click Score: 19    End of Session Equipment Utilized During Treatment: Gait belt Activity Tolerance: Patient tolerated treatment well Patient left: with call bell/phone within  reach;in bed;with bed alarm set Nurse Communication: Mobility status PT Visit Diagnosis: Muscle weakness (generalized) (M62.81);Difficulty in walking, not elsewhere classified (R26.2);Unsteadiness on feet (R26.81)     Time: 1720-1741 PT Time Calculation (min) (ACUTE ONLY): 21 min  Charges:  $Gait Training: 8-22 mins                     Coolidge Breeze, PT, DPT [facility] Department Office: [phone] Weekend pager: [phone]   Coolidge Breeze 06/03/2022, 5:48 PM

## 2022-06-03 NOTE — Progress Notes (Signed)
PT Cancellation Note  Patient Details Name: Alyssa Rhodes MRN: [id number] DOB: 01-05-1961   Cancelled Treatment:    Reason Eval/Treat Not Completed: (P) Patient declined, no reason specified. Pt eating her lunch and requesting return later; will follow up as schedule and pt status allows which may be another day.  Coolidge Breeze, PT, DPT [place] [facility] Office: [phone] Weekend pager: [phone]  Coolidge Breeze 06/03/2022, 1:25 PM

## 2022-06-04 DIAGNOSIS — R601 Generalized edema: Secondary | ICD-10-CM | POA: Diagnosis not present

## 2022-06-04 DIAGNOSIS — J90 Pleural effusion, not elsewhere classified: Secondary | ICD-10-CM | POA: Diagnosis not present

## 2022-06-04 DIAGNOSIS — J940 Chylous effusion: Secondary | ICD-10-CM | POA: Diagnosis not present

## 2022-06-04 DIAGNOSIS — I890 Lymphedema, not elsewhere classified: Secondary | ICD-10-CM | POA: Diagnosis not present

## 2022-06-04 DIAGNOSIS — I3139 Other pericardial effusion (noninflammatory): Secondary | ICD-10-CM

## 2022-06-04 LAB — BASIC METABOLIC PANEL
Anion gap: 13 (ref 5–15)
BUN: 42 mg/dL — ABNORMAL HIGH (ref 8–23)
CO2: 34 mmol/L — ABNORMAL HIGH (ref 22–32)
Calcium: 9 mg/dL (ref 8.9–10.3)
Chloride: 85 mmol/L — ABNORMAL LOW (ref 98–111)
Creatinine, Ser: 1.05 mg/dL — ABNORMAL HIGH (ref 0.44–1.00)
GFR, Estimated: >60 mL/min (ref >60)
Glucose, Bld: 198 mg/dL — ABNORMAL HIGH (ref 70–99)
Potassium: 2.8 mmol/L — ABNORMAL LOW (ref 3.5–5.1)
Sodium: 132 mmol/L — ABNORMAL LOW (ref 135–145)

## 2022-06-04 LAB — GLUCOSE, CAPILLARY
Glucose-Capillary: 158 mg/dL — ABNORMAL HIGH (ref 70–99)
Glucose-Capillary: 229 mg/dL — ABNORMAL HIGH (ref 70–99)
Glucose-Capillary: 174 mg/dL — ABNORMAL HIGH (ref 70–99)
Glucose-Capillary: 240 mg/dL — ABNORMAL HIGH (ref 70–99)

## 2022-06-04 LAB — CHOLESTEROL, BODY FLUID: Cholesterol, Fluid: 106 mg/dL

## 2022-06-04 MED ORDER — POTASSIUM CHLORIDE CRYS ER 20 MEQ PO TBCR
40.0000 meq | EXTENDED_RELEASE_TABLET | ORAL | Status: AC
  Administered 2022-06-04 (×2): 40 meq via ORAL
  Filled 2022-06-04 (×2): qty 2

## 2022-06-04 NOTE — TOC Progression Note (Signed)
Transition of Care [facility]) - Progression Note    Patient Details  Name: JACQUELINE SPOFFORD MRN: [id number] Date of Birth: 1961-07-22  Transition of Care [facility]) CM/SW Contact  Henrietta Dine, RN Phone Number: 06/04/2022, 3:09 PM  Clinical Narrative:    PT eval on 06/03/22; no follow up need; ? OP choly; TOC will con't to follow.   Expected Discharge Plan: Home/Self Care Barriers to Discharge: Continued Medical Work up  Expected Discharge Plan and Services Expected Discharge Plan: Home/Self Care In-house Referral: NA Discharge Planning Services: CM Consult Post Acute Care Choice: NA Living arrangements for the past 2 months: Single Family Home                 DME Arranged: Patient refused services         HH Arranged: Patient Refused [place] Agency: NA         Social Determinants of Health (SDOH) Interventions    Readmission Risk Interventions    05/27/2022   12:02 PM 04/11/2022   11:35 AM  Readmission Risk Prevention Plan  Transportation Screening Complete Complete  PCP or Specialist Appt within 5-7 Days  Complete  PCP or Specialist Appt within 3-5 Days Complete   Home Care Screening  Complete  Medication Review (RN CM)  Complete  HRI or Home Care Consult Complete   Social Work Consult for [place] Planning/Counseling Complete   Palliative Care Screening Not Applicable   Medication Review Press [employer]) Referral to Pharmacy

## 2022-06-04 NOTE — Progress Notes (Signed)
NAME:  Alyssa Rhodes, MRN:  [id number], DOB:  [birth year], LOS: 12 ADMISSION DATE:  05/23/2022, CONSULTATION DATE:  06/01/22 REFERRING MD:  Lonny Prude CHIEF COMPLAINT:  Abd pain   History of Present Illness:  Alyssa Rhodes is a 61 y.o. female who has a PMH as below including but not limited to RA (was on MTX but recently taken off by rheumatology on 10/24), DM, HTN, HLD, anasarca of unclear etiology. She presented to [facility] 10/30 with RUQ pain, N/V, LE edema. She was recently started on Torsemide for LE edema and volume overload. Abd pain has been chronic but had gotten worse. In fact, she was seen at [facility] for the same and was diagnosed with biliary dyskinesia.  She was evaluated by surgery who felt that gallbladder was not part of her issue (Had HIDA 9/18 with EF18% and patent cystic duct and CBD). She was evaluated by cardiology who did not feel that her anasarca was due to cardiac issues (echo 03/15/22 with EF 60-65%, no RWMA, small effusion. LE duplex also negative for DVT). They recommended heme-onc consult for possible bone marrow biopsy. Surgery has mentioned some concern for lymphatic disease causing her anasarca and chylothoraces (see below).  During this admission, she was found to have bilateral pleural effusions. She had right thoracentesis 10/31 by IR which yielded 1.3L of chylous appearing fluid. She again had 2nd right thora on 11/8 that again yielded 850cc of chylous appearing fluid. Cytology from first thora showed reactive mesothelial cells. Of note, no triglycerides or cholesterol was sent on pleural fluid.  She is being managed by [facility]; however, due to her suspected chylothoraces, PCCM asked to see in consultation. Per last TRH note from 11/7, discussion with rheumatology was had and they did not have any rheumatologic reason to explain her anasarca either. 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  Significant Hospital Events: Including procedures, antibiotic start and stop dates in addition to other pertinent events   10/30 admit. 10/31 right thora by IR which yielded 1.3L of chylous fluid. 11/8 2nd right thora by IR which yielded 850cc of chylous fluid. 11/8 PCCM consult. 11/10 Left thoracentesis with 420 cc of chylous fluid  Interim History / Subjective:   Underwent left thoracentesis yesterday. On room air, comfortable with no respiratory distress.  Objective:  Blood pressure 106/72, pulse 82, temperature 98.1 F (36.7 C), temperature source Oral, resp. rate 18, height _0  (1.549 m), weight 62 kg, SpO2 95 %.        Intake/Output Summary (Last 24 hours) at 06/04/2022 1210 Last data filed at 06/04/2022 0940 Gross per 24 hour  Intake 960 ml  Output 1450 ml  Net -490 ml   Filed Weights   05/30/22 0603 06/03/22 1238 06/04/22 0422  Weight: 60.3 kg 59 kg 62 kg    Examination: Gen:      No acute distress HEENT:  EOMI, sclera anicteric Neck:     No masses; no thyromegaly Lungs:    Clear to auscultation bilaterally; normal respiratory effort CV:  Regular rate and rhythm; no murmurs Abd:      + bowel sounds; soft, non-tender; no palpable masses, no distension Ext:    No edema; adequate peripheral perfusion Skin:      Warm and dry; no rash Neuro: alert and oriented x 3 Psych: normal mood and affect   CT Chest on  06/02/22 shows increased  size of left effusion, decrease in size of right effusion  Assessment & Plan:   Right chylothorax Left pleural effusion s/p thora x 2 on 10/31 and 11/8 both with chylous fluid obtained, TG 11/8 was 992! Unclear etiology at this point but potentially related to her RA vthhough this is very rare. No recent surgeries to raise possibility of thoracic duct injury etc.  - RD has been consulted for dietary modification - Follow TG, cholesterol on left effusion - upep - If effusion reccurs then consider lymphatic imaging.   We will follow back on 11/13. Please call with any questions over the weekend  Best practice (evaluated daily):  Per triad  Marshell Garfinkel MD [place] [facility] See Amion for pager  If no response to pager , please call [phone] until 7pm After 7:00 pm call Elink  [phone] 06/04/2022, 12:10 PM

## 2022-06-04 NOTE — Progress Notes (Signed)
Mobility Specialist - Progress Note   06/04/22 1023  Mobility  Activity Ambulated independently in hallway  Level of Assistance Independent  Assistive Device None  Distance Ambulated (ft) 500 ft  Activity Response Tolerated well  Mobility Referral Yes  $Mobility charge 1 Mobility   Pt received EOB and agreeable to mobility. No complaints during mobility. Pt to bed after session with all needs met.      [facility]

## 2022-06-04 NOTE — Progress Notes (Signed)
PROGRESS NOTE    Alyssa Rhodes  NWG:[id number] DOB: 03/12/1961 DOA: 05/23/2022 PCP: Doretha Sou, MD   Brief Narrative: Alyssa Rhodes is a 61 y.o. female with a history of rheumatoid arthritis, diabetes mellitus type 2, hyperlipidemia, hypertension. Patient presented secondary to right sided abdominal pain. Her pain was managed conservatively but she also had significant anasarca requiring IV diuresis with improvement of edema. During treatment, patient appears to have suffered possible ischemic injury to liver as evident by significantly elevated AST/ALT which is now improving. 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 Low fat diet. Pulmonology consulted.   Assessment and Plan: * Anasarca Chronic. Unclear etiology. Patient evaluated by cardiology with concern for non-cardiogenic etiology. Patient managed with Lasix IV, zaroxolyn and albumin with improvement of edema. 24 urine undetectable. Weight on admission of 67 kg (147.71 lbs) with weight slightly up today at 62 kg (136.69 lbs) today. Lasix IV transitioned to torsemide PO. -Continue torsemide -Daily weights and strict in/out  Bilateral pleural effusion Left thoracentesis performed on 11/10 also suggesting chylothorax  Abdominal pain Patient previously evaluated for consideration of elective cholecystectomy as an outpatient. General surgery consulted while inpatient. HIDA scan noted to be significant for reduced EF and patent cystic and common bile ducts. Patient without indication for inpatient cholecystectomy, so procedure deferred to outpatient. Abdominal pain appears to be improved from previous.  Bilateral leg pain -Continue gabapentin  ACTH deficiency (HCC) Unclear etiology, although patient does have an incidental right adrenal nodularity likely to be an underlying adenoma. Normal cortisol level with low ACTH level of 3. Normal TSH. Patient will need  endocrinology follow-up as an outpatient.  Pericardial effusion Noted one cho from 02/2022.  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 Pleural fluid triglyceride level of 992. Patient has received two thoracentesis, both with milky fluid consistent with chylothorax. No trauma or recent surgeries. No known malignancy. Recent CT abdomen/pelvis without contrast unremarkable for etiology. Diet adjusted. CT chest without etiology for chylothorax. Pulmonology consulted on 11/8. -Pleural fluid cholesterol pending -Pulmonology recommendations: pending today  Protein-calorie malnutrition, severe Dietitian recommendations (11/8): Restricted fat diet to no more than 10g fat per day Reviewed with patient MD to order MCT oil, 5 ml TID then increase by 5 ml every day to recommended dose of 20 ml TID (provides ~460 fat kcals) Encouraged pt to continue protein supplements: Prosource Plus PO TID, each provides 100 kcals and 15g protein (0g fat) Boost Breeze po TID, each supplement provides 250 kcal and 9 grams of protein (0g fat) D/c Ensure Max -pt request  Transaminitis Severely elevated AST/ALT up to 2,146/1,035. Presumed ischemic per GI discussion this admission. AST/ALT trending down.  Hypokalemia Likely related to diuresis. -Continue potassium supplementation as needed  Rheumatoid arthritis ([place]) Patient previously on methotrexate and was discontinued for plan for cholecystectomy.  Type 2 diabetes mellitus with complication, without long-term current use of insulin (HCC) Most recent hemoglobin A1C of 7.5%. Uncontrolled with hyperglycemia. -Continue SSI  HTN (hypertension) Mostly normotensive.  Hyperkalemia-resolved as of 06/03/2022 Appears to likely have been erroneous.     DVT prophylaxis: Lovenox Code Status:   Code Status: Full Code Family Communication: None at bedside Disposition Plan: Discharge home pending workup/management of  chylothorax   Consultants:  General surgery Pulmonology  Procedures:  Thoracentesis  Antimicrobials: None    Subjective: No dyspnea or chest pain.   Objective: BP 106/72 (BP Location: Left Arm)   Pulse 82   Temp 98.1  F (36.7 C) (Oral)   Resp 18   Ht '5\' 1"'$  (1.549 m)   Wt 62 kg   SpO2 95%   BMI 25.83 kg/m   Examination:  General exam: Appears calm and comfortable Respiratory system: Clear to auscultation. Respiratory effort normal. Cardiovascular system: S1 & S2 heard, Normal rate with regular rhythm. Gastrointestinal system: Abdomen is nondistended, soft and nontender. Normal bowel sounds heard. Central nervous system: Alert and oriented. No focal neurological deficits. Musculoskeletal: LUE and BLE edema. No calf tenderness Skin: No cyanosis. No rashes Psychiatry: Judgement and insight appear normal. Mood & affect appropriate.   Data Reviewed: I have personally reviewed following labs and imaging studies  CBC Lab Results  Component Value Date   WBC 5.6 06/03/2022   RBC 4.46 06/03/2022   HGB 14.4 06/03/2022   HCT 43.6 06/03/2022   MCV 97.8 06/03/2022   MCH 32.3 06/03/2022   PLT 539 (H) 06/03/2022   MCHC 33.0 06/03/2022   RDW 13.6 06/03/2022   LYMPHSABS 0.9 05/29/2022   MONOABS 0.4 05/29/2022   EOSABS 0.2 05/29/2022   BASOSABS 0.0 42/70/6237     Last metabolic panel Lab Results  Component Value Date   NA 133 (L) 06/03/2022   K 3.9 06/03/2022   CL 84 (L) 06/03/2022   CO2 36 (H) 06/03/2022   BUN 38 (H) 06/03/2022   CREATININE 1.22 (H) 06/03/2022   GLUCOSE 144 (H) 06/03/2022   GFRNONAA 50 (L) 06/03/2022   GFRAA >60 12/17/2019   CALCIUM 9.5 06/03/2022   PHOS 3.7 05/24/2022   PROT 6.5 06/01/2022   ALBUMIN 3.5 06/01/2022   LABGLOB 2.8 03/17/2022   AGRATIO 1.1 03/17/2022   BILITOT 0.5 06/01/2022   ALKPHOS 93 06/01/2022   AST 53 (H) 06/01/2022   ALT 105 (H) 06/01/2022   ANIONGAP 13 06/03/2022    GFR: Estimated Creatinine Clearance: 40.9  mL/min (A) (by C-G formula based on SCr of 1.22 mg/dL (H)).  Recent Results (from the past 240 hour(s))  Body fluid culture w Gram Stain     Status: None (Preliminary result)   Collection Time: 06/01/22 11:17 AM   Specimen: PATH Cytology Pleural fluid  Result Value Ref Range Status   Specimen Description   Final    PLEURAL Performed at [facility] [address], [place], [place] [address]    Special Requests   Final    NONE Performed at [facility], [place] [address], [place], [place] [address]    Gram Stain NO WBC SEEN NO ORGANISMS SEEN   Final   Culture   Final    NO GROWTH 2 DAYS Performed at [facility], [place] [address], [place], [place] [address]    Report Status PENDING  Incomplete  Culture, body fluid w Gram Stain-bottle     Status: None (Preliminary result)   Collection Time: 06/03/22  3:02 PM   Specimen: Pleura  Result Value Ref Range Status   Specimen Description PLEURAL  Final   Special Requests NONE  Final   Culture   Final    NO GROWTH < 24 HOURS Performed at [facility], [place] [address], [place], [place] [address]    Report Status PENDING  Incomplete  Gram stain     Status: None   Collection Time: 06/03/22  3:02 PM   Specimen: Pleura  Result Value Ref Range Status   Specimen Description PLEURAL  Final   Special Requests NONE  Final   Gram Stain  Final    RARE WBC PRESENT, PREDOMINANTLY MONONUCLEAR NO ORGANISMS SEEN Performed at [facility], [place] [address] [place], [place] [address]    Report Status 06/03/2022 FINAL  Final      Radiology Studies: US THORACENTESIS ASP PLEURAL SPACE W/IMG GUIDE  Result Date: 06/03/2022 INDICATION: Patient with history of chylothorax, rheumatoid arthritis, anasarca; request received for diagnostic and therapeutic left thoracentesis. EXAM: ULTRASOUND GUIDED DIAGNOSTIC AND THERAPEUTIC LEFT THORACENTESIS MEDICATIONS: 8 mL 1% lidocaine COMPLICATIONS: None immediate.  PROCEDURE: An ultrasound guided thoracentesis was thoroughly discussed with the patient and questions answered. The benefits, risks, alternatives and complications were also discussed. The patient understands and wishes to proceed with the procedure. Written consent was obtained. Ultrasound was performed to localize and mark an adequate pocket of fluid in the left chest. The area was then prepped and draped in the normal sterile fashion. 1% Lidocaine was used for local anesthesia. Under ultrasound guidance a 6 Fr Safe-T-Centesis catheter was introduced. Thoracentesis was performed. The catheter was removed and a dressing applied. FINDINGS: A total of approximately 420 cc of chylous fluid was removed. Samples were sent to the laboratory as requested by the clinical team. IMPRESSION: Successful ultrasound guided diagnostic and therapeutic left thoracentesis yielding 420 cc of pleural fluid. Read by: Rowe Robert, PA-C Electronically Signed   By: Miachel Roux M.D.   On: 06/03/2022 15:35   DG Chest 1 View  Result Date: 06/03/2022 CLINICAL DATA:  Status post left thoracentesis, pleural effusion EXAM: CHEST  1 VIEW COMPARISON:  06/01/2022 FINDINGS: Single frontal view of the chest demonstrates a stable cardiac silhouette. There has been complete resolution of the left pleural effusion after interval thoracentesis. No evidence of pneumothorax. Trace right pleural effusion is again noted. No acute airspace disease. No acute bony abnormalities. IMPRESSION: 1. No complication after left thoracentesis, with resolution of the left pleural effusion. 2. Trace right pleural effusion. Electronically Signed   By: Randa Ngo M.D.   On: 06/03/2022 15:17      LOS: 12 days    Cordelia Poche, MD [facility] 06/04/2022, 10:16 AM   If 7PM-7AM, please contact night-coverage [email or web address]

## 2022-06-05 DIAGNOSIS — I890 Lymphedema, not elsewhere classified: Secondary | ICD-10-CM | POA: Diagnosis not present

## 2022-06-05 DIAGNOSIS — I3139 Other pericardial effusion (noninflammatory): Secondary | ICD-10-CM | POA: Diagnosis not present

## 2022-06-05 DIAGNOSIS — J90 Pleural effusion, not elsewhere classified: Secondary | ICD-10-CM | POA: Diagnosis not present

## 2022-06-05 DIAGNOSIS — R601 Generalized edema: Secondary | ICD-10-CM | POA: Diagnosis not present

## 2022-06-05 LAB — GLUCOSE, CAPILLARY
Glucose-Capillary: 175 mg/dL — ABNORMAL HIGH (ref 70–99)
Glucose-Capillary: 202 mg/dL — ABNORMAL HIGH (ref 70–99)
Glucose-Capillary: 194 mg/dL — ABNORMAL HIGH (ref 70–99)
Glucose-Capillary: 196 mg/dL — ABNORMAL HIGH (ref 70–99)

## 2022-06-05 LAB — BASIC METABOLIC PANEL
Anion gap: 13 (ref 5–15)
BUN: 49 mg/dL — ABNORMAL HIGH (ref 8–23)
CO2: 34 mmol/L — ABNORMAL HIGH (ref 22–32)
Calcium: 9.4 mg/dL (ref 8.9–10.3)
Chloride: 86 mmol/L — ABNORMAL LOW (ref 98–111)
Creatinine, Ser: 1.17 mg/dL — ABNORMAL HIGH (ref 0.44–1.00)
GFR, Estimated: 53 mL/min — ABNORMAL LOW (ref >60)
Glucose, Bld: 191 mg/dL — ABNORMAL HIGH (ref 70–99)
Potassium: 3.3 mmol/L — ABNORMAL LOW (ref 3.5–5.1)
Sodium: 133 mmol/L — ABNORMAL LOW (ref 135–145)

## 2022-06-05 LAB — MAGNESIUM: Magnesium: 2 mg/dL (ref 1.7–2.4)

## 2022-06-05 LAB — BODY FLUID CULTURE W GRAM STAIN
Culture: NO GROWTH
Gram Stain: NONE SEEN

## 2022-06-05 MED ORDER — POTASSIUM CHLORIDE CRYS ER 20 MEQ PO TBCR
40.0000 meq | EXTENDED_RELEASE_TABLET | ORAL | Status: AC
  Administered 2022-06-05 (×2): 40 meq via ORAL
  Filled 2022-06-05 (×2): qty 2

## 2022-06-05 NOTE — Progress Notes (Signed)
Mobility Specialist - Progress Note   06/05/22 1627  Mobility  Activity Ambulated independently in hallway  Level of Assistance Independent  Assistive Device None  Distance Ambulated (ft) 500 ft  Activity Response Tolerated well  Mobility Referral Yes  $Mobility charge 1 Mobility   Pt received in bed and agreeable to mobility. No complaints during mobility. Pt to bed after session with all needs met.      [facility]

## 2022-06-05 NOTE — Progress Notes (Signed)
PROGRESS NOTE    Alyssa Rhodes  NKN:[id number] DOB: 10-11-1960 DOA: 05/23/2022 PCP: Doretha Sou, MD   Brief Narrative: Alyssa Rhodes is a 61 y.o. female with a history of rheumatoid arthritis, diabetes mellitus type 2, hyperlipidemia, hypertension. Patient presented secondary to right sided abdominal pain. Her pain was managed conservatively but she also had significant anasarca requiring IV diuresis with improvement of edema. During treatment, patient appears to have suffered possible ischemic injury to liver as evident by significantly elevated AST/ALT which is now improving. 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 Low fat diet. Pulmonology consulted.   Assessment and Plan: * Anasarca Chronic. Unclear etiology. Patient evaluated by cardiology with concern for non-cardiogenic etiology. Patient managed with Lasix IV, zaroxolyn and albumin with improvement of edema. 24 urine undetectable. Weight on admission of 67 kg (147.71 lbs) with weight slightly up today at 60.6 kg (133.6 lbs) today. Weight stable. Lasix IV transitioned to torsemide PO. -Continue torsemide -Daily weights and strict in/out  Bilateral chylothorax Fluid consistent with chylothorax. Right pleural fluid triglyceride level of 992. Patient has received two right thoracentesis, both with milky fluid consistent with chylothorax and one left thoracentesis with similar fluid. No trauma or recent surgeries. No known malignancy. Recent CT abdomen/pelvis without contrast unremarkable for etiology. Diet adjusted. CT chest without etiology for chylothorax. Pulmonology consulted on 11/8. -Pleural fluid cholesterol pending and left sided pleural fluid pending -Pulmonology recommendations: pending -Chest x-ray in AM  Bilateral pleural effusion Secondary to chylothorax  Bilateral leg pain -Continue gabapentin  ACTH deficiency ([place]) Unclear etiology, although  patient does have an incidental right adrenal nodularity likely to be an underlying adenoma. Normal cortisol level with low ACTH level of 3. Normal TSH. Patient will need endocrinology follow-up as an outpatient.  Hypokalemia Likely related to diuresis. -Continue potassium supplementation as needed  Abdominal pain Patient previously evaluated for consideration of elective cholecystectomy as an outpatient. General surgery consulted while inpatient. HIDA scan noted to be significant for reduced EF and patent cystic and common bile ducts. Patient without indication for inpatient cholecystectomy, so procedure deferred to outpatient. Abdominal pain appears to be improved from previous.  HTN (hypertension) Mostly normotensive.  Pericardial effusion Noted one echo from 02/2022.  Protein-calorie malnutrition, severe Dietitian recommendations (11/8): Restricted fat diet to no more than 10g fat per day Reviewed with patient MD to order MCT oil, 5 ml TID then increase by 5 ml every day to recommended dose of 20 ml TID (provides ~460 fat kcals) Encouraged pt to continue protein supplements: Prosource Plus PO TID, each provides 100 kcals and 15g protein (0g fat) Boost Breeze po TID, each supplement provides 250 kcal and 9 grams of protein (0g fat) D/c Ensure Max -pt request  Transaminitis Severely elevated AST/ALT up to 2,146/1,035. Presumed ischemic per GI discussion this admission. AST/ALT trending down.  Rheumatoid arthritis ([place]) Patient previously on methotrexate and was discontinued for plan for cholecystectomy.  Type 2 diabetes mellitus with complication, without long-term current use of insulin (HCC) Most recent hemoglobin A1C of 7.5%. Uncontrolled with hyperglycemia. -Continue SSI  Hyperkalemia-resolved as of 06/03/2022 Appears to likely have been erroneous.     DVT prophylaxis: Lovenox Code Status:   Code Status: Full Code Family Communication: None at bedside Disposition Plan:  Discharge home pending workup/management of chylothorax   Consultants:  General surgery Pulmonology  Procedures:  Thoracentesis  Antimicrobials: None    Subjective: Patient reports no issues. Left arm swelling improving.  Objective: BP 108/62 (BP Location: Left Arm)   Pulse 80   Temp 98.6 F (37 C) (Oral)   Resp 16   Ht '5\' 1"'$  (1.549 m)   Wt 60.6 kg   SpO2 98%   BMI 25.24 kg/m   Examination:  General exam: Appears calm and comfortable Respiratory system: Clear to auscultation. Respiratory effort normal. Cardiovascular system: S1 & S2 heard, RRR. Gastrointestinal system: Abdomen is nondistended, soft and nontender. Normal bowel sounds heard. Central nervous system: Alert and oriented. No focal neurological deficits. Musculoskeletal: LUE and BLE edema. No calf tenderness Skin: No cyanosis. No rashes Psychiatry: Judgement and insight appear normal. Mood & affect appropriate.    Data Reviewed: I have personally reviewed following labs and imaging studies  CBC Lab Results  Component Value Date   WBC 5.6 06/03/2022   RBC 4.46 06/03/2022   HGB 14.4 06/03/2022   HCT 43.6 06/03/2022   MCV 97.8 06/03/2022   MCH 32.3 06/03/2022   PLT 539 (H) 06/03/2022   MCHC 33.0 06/03/2022   RDW 13.6 06/03/2022   LYMPHSABS 0.9 05/29/2022   MONOABS 0.4 05/29/2022   EOSABS 0.2 05/29/2022   BASOSABS 0.0 81/82/9937     Last metabolic panel Lab Results  Component Value Date   NA 132 (L) 06/04/2022   K 2.8 (L) 06/04/2022   CL 85 (L) 06/04/2022   CO2 34 (H) 06/04/2022   BUN 42 (H) 06/04/2022   CREATININE 1.05 (H) 06/04/2022   GLUCOSE 198 (H) 06/04/2022   GFRNONAA >60 06/04/2022   GFRAA >60 12/17/2019   CALCIUM 9.0 06/04/2022   PHOS 3.7 05/24/2022   PROT 6.5 06/01/2022   ALBUMIN 3.5 06/01/2022   LABGLOB 2.8 03/17/2022   AGRATIO 1.1 03/17/2022   BILITOT 0.5 06/01/2022   ALKPHOS 93 06/01/2022   AST 53 (H) 06/01/2022   ALT 105 (H) 06/01/2022   ANIONGAP 13 06/04/2022     GFR: Estimated Creatinine Clearance: 47 mL/min (A) (by C-G formula based on SCr of 1.05 mg/dL (H)).  Recent Results (from the past 240 hour(s))  Body fluid culture w Gram Stain     Status: None   Collection Time: 06/01/22 11:17 AM   Specimen: PATH Cytology Pleural fluid  Result Value Ref Range Status   Specimen Description   Final    PLEURAL Performed at [facility] [address] [place], [place] [address]    Special Requests   Final    NONE Performed at [facility], [place] [address], [place], [place] [address]    Gram Stain NO WBC SEEN NO ORGANISMS SEEN   Final   Culture   Final    NO GROWTH 3 DAYS Performed at [facility], [place] [address], [place], [place] [address]    Report Status 06/05/2022 FINAL  Final  Culture, body fluid w Gram Stain-bottle     Status: None (Preliminary result)   Collection Time: 06/03/22  3:02 PM   Specimen: Pleura  Result Value Ref Range Status   Specimen Description PLEURAL  Final   Special Requests NONE  Final   Culture   Final    NO GROWTH 2 DAYS Performed at [facility] [address] [place], [place] [address]    Report Status PENDING  Incomplete  Gram stain     Status: None   Collection Time: 06/03/22  3:02 PM   Specimen: Pleura  Result Value Ref Range Status   Specimen Description PLEURAL  Final  Special Requests NONE  Final   Gram Stain   Final    RARE WBC PRESENT, PREDOMINANTLY MONONUCLEAR NO ORGANISMS SEEN Performed at [facility], [place] [address], [place], [place] [address]    Report Status 06/03/2022 FINAL  Final      Radiology Studies: US THORACENTESIS ASP PLEURAL SPACE W/IMG GUIDE  Result Date: 06/03/2022 INDICATION: Patient with history of chylothorax, rheumatoid arthritis, anasarca; request received for diagnostic and therapeutic left thoracentesis. EXAM: ULTRASOUND GUIDED DIAGNOSTIC AND THERAPEUTIC LEFT THORACENTESIS MEDICATIONS: 8 mL 1% lidocaine  COMPLICATIONS: None immediate. PROCEDURE: An ultrasound guided thoracentesis was thoroughly discussed with the patient and questions answered. The benefits, risks, alternatives and complications were also discussed. The patient understands and wishes to proceed with the procedure. Written consent was obtained. Ultrasound was performed to localize and mark an adequate pocket of fluid in the left chest. The area was then prepped and draped in the normal sterile fashion. 1% Lidocaine was used for local anesthesia. Under ultrasound guidance a 6 Fr Safe-T-Centesis catheter was introduced. Thoracentesis was performed. The catheter was removed and a dressing applied. FINDINGS: A total of approximately 420 cc of chylous fluid was removed. Samples were sent to the laboratory as requested by the clinical team. IMPRESSION: Successful ultrasound guided diagnostic and therapeutic left thoracentesis yielding 420 cc of pleural fluid. Read by: Rowe Robert, PA-C Electronically Signed   By: Miachel Roux M.D.   On: 06/03/2022 15:35   DG Chest 1 View  Result Date: 06/03/2022 CLINICAL DATA:  Status post left thoracentesis, pleural effusion EXAM: CHEST  1 VIEW COMPARISON:  06/01/2022 FINDINGS: Single frontal view of the chest demonstrates a stable cardiac silhouette. There has been complete resolution of the left pleural effusion after interval thoracentesis. No evidence of pneumothorax. Trace right pleural effusion is again noted. No acute airspace disease. No acute bony abnormalities. IMPRESSION: 1. No complication after left thoracentesis, with resolution of the left pleural effusion. 2. Trace right pleural effusion. Electronically Signed   By: Randa Ngo M.D.   On: 06/03/2022 15:17      LOS: 13 days    Cordelia Poche, MD [facility] 06/05/2022, 11:55 AM   If 7PM-7AM, please contact night-coverage [email or web address]

## 2022-06-06 ENCOUNTER — Inpatient Hospital Stay (HOSPITAL_COMMUNITY): Payer: BC Managed Care – PPO

## 2022-06-06 DIAGNOSIS — R601 Generalized edema: Secondary | ICD-10-CM | POA: Diagnosis not present

## 2022-06-06 DIAGNOSIS — R609 Edema, unspecified: Secondary | ICD-10-CM | POA: Diagnosis not present

## 2022-06-06 DIAGNOSIS — J940 Chylous effusion: Secondary | ICD-10-CM | POA: Diagnosis not present

## 2022-06-06 DIAGNOSIS — J90 Pleural effusion, not elsewhere classified: Secondary | ICD-10-CM | POA: Diagnosis not present

## 2022-06-06 DIAGNOSIS — I3139 Other pericardial effusion (noninflammatory): Secondary | ICD-10-CM | POA: Diagnosis not present

## 2022-06-06 DIAGNOSIS — I890 Lymphedema, not elsewhere classified: Secondary | ICD-10-CM | POA: Diagnosis not present

## 2022-06-06 DIAGNOSIS — R590 Localized enlarged lymph nodes: Secondary | ICD-10-CM

## 2022-06-06 LAB — CBC
HCT: 40.3 % (ref 36.0–46.0)
Hemoglobin: 13.2 g/dL (ref 12.0–15.0)
MCH: 32.1 pg (ref 26.0–34.0)
MCHC: 32.8 g/dL (ref 30.0–36.0)
MCV: 98.1 fL (ref 80.0–100.0)
Platelets: 932 10*3/uL (ref 150–400)
RBC: 4.11 MIL/uL (ref 3.87–5.11)
RDW: 13.3 % (ref 11.5–15.5)
WBC: 7.2 10*3/uL (ref 4.0–10.5)
nRBC: 0 % (ref 0.0–0.2)

## 2022-06-06 LAB — BASIC METABOLIC PANEL
Anion gap: 14 (ref 5–15)
BUN: 51 mg/dL — ABNORMAL HIGH (ref 8–23)
CO2: 33 mmol/L — ABNORMAL HIGH (ref 22–32)
Calcium: 9.3 mg/dL (ref 8.9–10.3)
Chloride: 86 mmol/L — ABNORMAL LOW (ref 98–111)
Creatinine, Ser: 1.38 mg/dL — ABNORMAL HIGH (ref 0.44–1.00)
GFR, Estimated: 44 mL/min — ABNORMAL LOW (ref >60)
Glucose, Bld: 154 mg/dL — ABNORMAL HIGH (ref 70–99)
Potassium: 3.9 mmol/L (ref 3.5–5.1)
Sodium: 133 mmol/L — ABNORMAL LOW (ref 135–145)

## 2022-06-06 LAB — GLUCOSE, CAPILLARY
Glucose-Capillary: 114 mg/dL — ABNORMAL HIGH (ref 70–99)
Glucose-Capillary: 141 mg/dL — ABNORMAL HIGH (ref 70–99)
Glucose-Capillary: 143 mg/dL — ABNORMAL HIGH (ref 70–99)
Glucose-Capillary: 142 mg/dL — ABNORMAL HIGH (ref 70–99)

## 2022-06-06 LAB — CBC WITH DIFFERENTIAL/PLATELET
Abs Immature Granulocytes: 0.05 10*3/uL (ref 0.00–0.07)
Basophils Absolute: 0.1 10*3/uL (ref 0.0–0.1)
Basophils Relative: 1 %
Eosinophils Absolute: 0.3 10*3/uL (ref 0.0–0.5)
Eosinophils Relative: 3 %
HCT: 44.9 % (ref 36.0–46.0)
Hemoglobin: 14.8 g/dL (ref 12.0–15.0)
Immature Granulocytes: 1 %
Lymphocytes Relative: 16 %
Lymphs Abs: 1.2 10*3/uL (ref 0.7–4.0)
MCH: 32.5 pg (ref 26.0–34.0)
MCHC: 33 g/dL (ref 30.0–36.0)
MCV: 98.5 fL (ref 80.0–100.0)
Monocytes Absolute: 0.6 10*3/uL (ref 0.1–1.0)
Monocytes Relative: 8 %
Neutro Abs: 5.6 10*3/uL (ref 1.7–7.7)
Neutrophils Relative %: 71 %
Platelets: 970 10*3/uL (ref 150–400)
RBC: 4.56 MIL/uL (ref 3.87–5.11)
RDW: 13.4 % (ref 11.5–15.5)
WBC: 7.8 10*3/uL (ref 4.0–10.5)
nRBC: 0 % (ref 0.0–0.2)

## 2022-06-06 LAB — PLATELET COUNT: Platelets: 887 10*3/uL — ABNORMAL HIGH (ref 150–400)

## 2022-06-06 MED ORDER — MEDIUM CHAIN TRIGLYCERIDES PO OIL
5.0000 mL | TOPICAL_OIL | Freq: Three times a day (TID) | ORAL | Status: DC
  Filled 2022-06-06: qty 5

## 2022-06-06 MED ORDER — MEDIUM CHAIN TRIGLYCERIDES PO OIL: 5.0000 mL | TOPICAL_OIL | Freq: Three times a day (TID) | ORAL | Status: DC

## 2022-06-06 NOTE — Progress Notes (Addendum)
PROGRESS NOTE    SECRET KRISTENSEN  KKX:[id number] DOB: 03-28-1961 DOA: 05/23/2022 PCP: Doretha Sou, MD   Brief Narrative: Alyssa Rhodes is a 61 y.o. female with a history of rheumatoid arthritis, diabetes mellitus type 2, hyperlipidemia, hypertension. Patient presented secondary to right sided abdominal pain. Her pain was managed conservatively but she also had significant anasarca requiring IV diuresis with improvement of edema. During treatment, patient appears to have suffered possible ischemic injury to liver as evident by significantly elevated AST/ALT which is now improving. 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 Low fat diet. Pulmonology consulted.   Assessment and Plan: * Anasarca Chronic. Unclear etiology. Patient evaluated by cardiology with concern for non-cardiogenic etiology. Patient managed with Lasix IV, zaroxolyn and albumin with improvement of edema. 24 urine undetectable. Weight on admission of 67 kg (147.71 lbs) with weight slightly up today at 60.6 kg (133.6 lbs) today. Weight stable. Lasix IV transitioned to torsemide PO. Creatinine elevated. -Hold torsemide -Daily weights and strict in/out  Bilateral chylothorax Fluid consistent with chylothorax. Right pleural fluid triglyceride level of 992. Patient has received two right thoracentesis, both with milky fluid consistent with chylothorax and one left thoracentesis with similar fluid. No trauma or recent surgeries. No known malignancy. Recent CT abdomen/pelvis without contrast unremarkable for etiology. Diet adjusted. CT chest without etiology for chylothorax. Pulmonology consulted on 11/8. Chest x-ray with small effusions. -Pleural fluid cholesterol pending and left sided pleural fluid pending -Pulmonology recommendations: pending  Bilateral pleural effusion Secondary to chylothorax  Bilateral leg pain -Continue gabapentin  ACTH deficiency  (HCC) Unclear etiology, although patient does have an incidental right adrenal nodularity likely to be an underlying adenoma. Normal cortisol level with low ACTH level of 3. Normal TSH. Patient will need endocrinology follow-up as an outpatient.  Hypokalemia Likely related to diuresis. -Continue potassium supplementation as needed  Abdominal pain Patient previously evaluated for consideration of elective cholecystectomy as an outpatient. General surgery consulted while inpatient. HIDA scan noted to be significant for reduced EF and patent cystic and common bile ducts. Patient without indication for inpatient cholecystectomy, so procedure deferred to outpatient. Abdominal pain appears to be improved from previous.  HTN (hypertension) Mostly normotensive.  Pericardial effusion Noted one echo from 02/2022.  Protein-calorie malnutrition, severe Dietitian recommendations (11/8): Restricted fat diet to no more than 10g fat per day Reviewed with patient MD to order MCT oil, 5 ml TID then increase by 5 ml every day to recommended dose of 20 ml TID (provides ~460 fat kcals) Encouraged pt to continue protein supplements: Prosource Plus PO TID, each provides 100 kcals and 15g protein (0g fat) Boost Breeze po TID, each supplement provides 250 kcal and 9 grams of protein (0g fat) D/c Ensure Max -pt request  Transaminitis Severely elevated AST/ALT up to 2,146/1,035. Presumed ischemic per GI discussion this admission. AST/ALT trending down.  Rheumatoid arthritis ([place]) Patient previously on methotrexate and was discontinued for plan for cholecystectomy.  Type 2 diabetes mellitus with complication, without long-term current use of insulin (HCC) Most recent hemoglobin A1C of 7.5%. Uncontrolled with hyperglycemia. -Continue SSI  Reactive thrombocytosis Recurrent. Peak of 970 with repeat of 887. -Repeat CBC in AM  Hyperkalemia-resolved as of 06/03/2022 Appears to likely have been erroneous.      DVT prophylaxis: Lovenox Code Status:   Code Status: Full Code Family Communication: None at bedside Disposition Plan: Discharge home pending workup/management of chylothorax   Consultants:  General surgery Pulmonology  Procedures:  Thoracentesis  Antimicrobials: None    Subjective: Significant diarrhea attributed to MCT oil. No other concerns.  Objective: BP 112/80 (BP Location: Right Arm)   Pulse 81   Temp 98.6 F (37 C) (Oral)   Resp 17   Ht '5\' 1"'$  (1.549 m)   Wt 60.6 kg   SpO2 100%   BMI 25.24 kg/m   Examination:  General exam: Appears calm and comfortable Respiratory system: Clear to auscultation. Respiratory effort normal. Cardiovascular system: S1 & S2 heard, RRR. No murmurs, rubs, gallops or clicks. Gastrointestinal system: Abdomen is non-distended, soft and non-tender. Normal bowel sounds heard. Central nervous system: Alert and oriented. No focal neurological deficits. Musculoskeletal: BLE edema. No calf tenderness Skin: No cyanosis. No rashes Psychiatry: Judgement and insight appear normal. Mood & affect appropriate.    Data Reviewed: I have personally reviewed following labs and imaging studies  CBC Lab Results  Component Value Date   WBC 7.2 06/06/2022   RBC 4.11 06/06/2022   HGB 13.2 06/06/2022   HCT 40.3 06/06/2022   MCV 98.1 06/06/2022   MCH 32.1 06/06/2022   PLT 887 (H) 06/06/2022   MCHC 32.8 06/06/2022   RDW 13.3 06/06/2022   LYMPHSABS 0.9 05/29/2022   MONOABS 0.4 05/29/2022   EOSABS 0.2 05/29/2022   BASOSABS 0.0 26/83/4196     Last metabolic panel Lab Results  Component Value Date   NA 133 (L) 06/06/2022   K 3.9 06/06/2022   CL 86 (L) 06/06/2022   CO2 33 (H) 06/06/2022   BUN 51 (H) 06/06/2022   CREATININE 1.38 (H) 06/06/2022   GLUCOSE 154 (H) 06/06/2022   GFRNONAA 44 (L) 06/06/2022   GFRAA >60 12/17/2019   CALCIUM 9.3 06/06/2022   PHOS 3.7 05/24/2022   PROT 6.5 06/01/2022   ALBUMIN 3.5 06/01/2022   LABGLOB 2.8  03/17/2022   AGRATIO 1.1 03/17/2022   BILITOT 0.5 06/01/2022   ALKPHOS 93 06/01/2022   AST 53 (H) 06/01/2022   ALT 105 (H) 06/01/2022   ANIONGAP 14 06/06/2022    GFR: Estimated Creatinine Clearance: 35.8 mL/min (A) (by C-G formula based on SCr of 1.38 mg/dL (H)).  Recent Results (from the past 240 hour(s))  Body fluid culture w Gram Stain     Status: None   Collection Time: 06/01/22 11:17 AM   Specimen: PATH Cytology Pleural fluid  Result Value Ref Range Status   Specimen Description   Final    PLEURAL Performed at [facility] [address], [place], [place] [address]    Special Requests   Final    NONE Performed at [facility], [place] [address], [place], [place] [address]    Gram Stain NO WBC SEEN NO ORGANISMS SEEN   Final   Culture   Final    NO GROWTH 3 DAYS Performed at [facility], [place] [address], [place], [place] [address]    Report Status 06/05/2022 FINAL  Final  Culture, body fluid w Gram Stain-bottle     Status: None (Preliminary result)   Collection Time: 06/03/22  3:02 PM   Specimen: Pleura  Result Value Ref Range Status   Specimen Description PLEURAL  Final   Special Requests NONE  Final   Culture   Final    NO GROWTH 3 DAYS Performed at [facility] [address], [place], [place] [address]    Report Status PENDING  Incomplete  Gram stain     Status: None   Collection Time:  06/03/22  3:02 PM   Specimen: Pleura  Result Value Ref Range Status   Specimen Description PLEURAL  Final   Special Requests NONE  Final   Gram Stain   Final    RARE WBC PRESENT, PREDOMINANTLY MONONUCLEAR NO ORGANISMS SEEN Performed at [facility], [address], [place], [place] [address]    Report Status 06/03/2022 FINAL  Final      Radiology Studies: DG Chest 2 View  Result Date: 06/06/2022 CLINICAL DATA:  Chylothorax. EXAM: CHEST - 2 VIEW COMPARISON:  June 03, 2022. FINDINGS: The heart size and  mediastinal contours are within normal limits. Minimal bibasilar subsegmental atelectasis is noted with small pleural effusions. The visualized skeletal structures are unremarkable. IMPRESSION: Minimal bibasilar subsegmental atelectasis is noted with small bilateral pleural effusions. Electronically Signed   By: Marijo Conception M.D.   On: 06/06/2022 09:59      LOS: 14 days    Cordelia Poche, MD [facility] 06/06/2022, 12:06 PM   If 7PM-7AM, please contact night-coverage [email or web address]

## 2022-06-06 NOTE — Progress Notes (Signed)
Mobility Specialist - Progress Note   06/06/22 1409  Mobility  Activity Ambulated independently in hallway  Level of Assistance Independent after set-up  Assistive Device None  Distance Ambulated (ft) 500 ft  Activity Response Tolerated well  Mobility Referral Yes  $Mobility charge 1 Mobility   Pt received in bed and agreed to mobility, no c/o pain nor discomfort. Pt returned to bed with all needs met.   Roderick Pee Mobility Specialist

## 2022-06-06 NOTE — Assessment & Plan Note (Signed)
Recurrent. Peak of 970. Down to 19. -Follow-up CBC -Pathologist smear review pending

## 2022-06-06 NOTE — Progress Notes (Signed)
Mobility Specialist - Progress Note   06/06/22 1017  Mobility  Activity Ambulated independently in hallway  Level of Assistance Independent after set-up  Assistive Device None  Distance Ambulated (ft) 500 ft  Activity Response Tolerated well  Mobility Referral Yes  $Mobility charge 1 Mobility   Pt received in bed and agreed to mobility, no c/o pain nor discomfort during session. Pt returned to bed with all needs met.   Roderick Pee Mobility Specialist

## 2022-06-06 NOTE — Progress Notes (Signed)
NAME:  Alyssa Rhodes, MRN:  [id number], DOB:  [birth year], LOS: 29 ADMISSION DATE:  05/23/2022, CONSULTATION DATE:  06/01/22 REFERRING MD:  Lonny Prude CHIEF COMPLAINT:  Abd pain   History of Present Illness:  Alyssa Rhodes is a 61 y.o. female who has a PMH as below including but not limited to RA (was on MTX but recently taken off by rheumatology on 10/24), DM, HTN, HLD, anasarca of unclear etiology. She presented to [facility] 10/30 with RUQ pain, N/V, LE edema. She was recently started on Torsemide for LE edema and volume overload. Abd pain has been chronic but had gotten worse. In fact, she was seen at [facility] for the same and was diagnosed with biliary dyskinesia.  She was evaluated by surgery who felt that gallbladder was not part of her issue (Had HIDA 9/18 with EF18% and patent cystic duct and CBD). She was evaluated by cardiology who did not feel that her anasarca was due to cardiac issues (echo 03/15/22 with EF 60-65%, no RWMA, small effusion. LE duplex also negative for DVT). They recommended heme-onc consult for possible bone marrow biopsy. Surgery has mentioned some concern for lymphatic disease causing her anasarca and chylothoraces (see below).  During this admission, she was found to have bilateral pleural effusions. She had right thoracentesis 10/31 by IR which yielded 1.3L of chylous appearing fluid. She again had 2nd right thora on 11/8 that again yielded 850cc of chylous appearing fluid. Cytology from first thora showed reactive mesothelial cells. Of note, no triglycerides or cholesterol was sent on pleural fluid.  She is being managed by [facility]; however, due to her suspected chylothoraces, PCCM asked to see in consultation. Per last [facility] note from 11/7, discussion with rheumatology was had and they did not have any rheumatologic reason to explain her anasarca either. A 24h urine protein collection in progess  Pertinent  Medical History:  has HTN (hypertension); Tobacco user; Microcytic anemia; Other  fatigue; Deficiency anemia; Iron deficiency anemia; AKI (acute kidney injury) ([place]); Abdominal pain; Reactive thrombocytosis; Leucocytosis; Anasarca; Type 2 diabetes mellitus with complication, without long-term current use of insulin ([place]); Rheumatoid arthritis ([place]); Hypokalemia; Transaminitis; Bilateral pleural effusion; Protein-calorie malnutrition, severe; Bilateral chylothorax; Gallbladder disease; Pericardial effusion; Lymphedema of left upper extremity; ACTH deficiency (HCC); and Bilateral leg pain on their problem list.  Significant Hospital Events: Including procedures, antibiotic start and stop dates in addition to other pertinent events   10/30 admit. 10/31 right thora by IR which yielded 1.3L of chylous fluid. 11/8 2nd right thora by IR which yielded 850cc of chylous fluid. 11/8 PCCM consult. 11/10 Left thoracentesis with 420 cc of chylous fluid  Interim History / Subjective:  [name] denies complaints. No SOB.  Objective:  Blood pressure 93/82, pulse 77, temperature 98.2 F (36.8 C), temperature source Oral, resp. rate 16, height _0  (1.549 m), weight 60.6 kg, SpO2 97 %.        Intake/Output Summary (Last 24 hours) at 06/06/2022 1719 Last data filed at 06/06/2022 0813 Gross per 24 hour  Intake --  Output 1750 ml  Net -1750 ml    Filed Weights   06/03/22 1238 06/04/22 0422 06/05/22 0500  Weight: 59 kg 62 kg 60.6 kg    Examination: Gen:      Elderly woman lying in bed in NAD HEENT:  [place]/AT, eyes anicteric Neck:     posterior R cervical LN, small left supraclavicular adenopathy Lungs:    CTAB, breathing comfortably on RA CV:  S1S2, RRR Abd:      Soft, NT Ext:    + LUE distal edema, + BLE edema. No inguinal adenopathy. Skin:      no rashes Neuro: Awake and alert, answering questions appropriately, moving all extremities Psych: Cooperative with exam  Cxr personally reviewed> small reaccumulating effusions bilaterally   Platelets 970 BUN  51 Creatinine 1.38 Bicarb 33  Assessment & Plan:   Bilateral chylothorax Left pleural effusion s/p thora x 2 on 10/31 and 11/8 both with chylous fluid obtained, TG 11/8 was 992 (only checked on 1/3 thora so far) Unclear etiology at this point but potentially related to her RA, although this is very rare. Lymphoma is a potential concern, which she is at increased risk of due to RA history.  No recent surgeries to raise possibility of thoracic duct injury. - RD has been consulted for dietary modification - Follow cytology, TG, cholesterol on left effusion> pending  - upep pending -repeat CXR 2 view on 11/15 to monitor for reaccumulation - If effusion reccurs then needs lymphatic imaging. Would also get CT C/A/P to evaluate for lymphedema to explain abnormal lymphatic functioning.  Enlarged LN R cervical chain posteriorly -US neck   We will follow back on 11/15. Please call in the interim with quesitons.  Best practice (evaluated daily):  Per primary   Julian Hy, DO 06/06/22 7:34 PM [facility]

## 2022-06-06 NOTE — Progress Notes (Signed)
Nutrition Follow-up  DOCUMENTATION CODES:   Severe malnutrition in context of chronic illness  INTERVENTION:   -Decrease MCT oil dose to 5 ml, provide QID (20 ml total daily providing ~153 fat kcals) -If pt unable to tolerate MCT oil, may need to consider starting TPN/SMOF lipid infusion to help prevent essential fatty acid deficiency  -Restricted fat diet to no more than 10g fat per day   -Prosource Plus PO QID, each provides 100 kcals and 15g protein  -Multivitamin with minerals daily  -Boost Breeze po TID, each supplement provides 250 kcal and 9 grams of protein   NUTRITION DIAGNOSIS:   Severe Malnutrition related to chronic illness (abdominal pain, anasarca) as evidenced by energy intake < or equal to 75% for > or equal to 1 month, moderate fat depletion, severe muscle depletion.  Ongoing.  GOAL:   Patient will meet greater than or equal to 90% of their needs  Progressing.  MONITOR:   PO intake, Supplement acceptance, Weight trends, I & O's, Labs  ASSESSMENT:   61 yo female with PMH RA (previously on MTX, just taken off per rheumatology on 10/24), DMII, HLD, HTN. Admitted with right-sided abdominal pain.  10/31: thoracentesis, 1.3L chylous fluid   11/8: s/p thoracentesis, 850 ml chylous fluid 11/10: s/p thoracentesis, 420 ml chylous fluid  Pt now with diarrhea r/t MCT oil supplementation per Pharmacy. Will trial lower dose to see if this helps symptoms. Pt refused first dose. Not accepting protein supplements now. Pt at risk of developing EFAD. May need to consider SMOF lipid infusion.  Pt is following fat restriction with meals. Meals ranging between 0-7g. Still consuming <10g fat per day.   Admission weight: 147 lbs Current weight: 133 lbs  Medications: Multivitamin with minerals daily, Octreotide  Labs reviewed:  CBGs: 114-202 Low Na  Diet Order:   Diet Order             Diet Heart Room service appropriate? Yes; Fluid consistency: Thin  Diet  effective now                   EDUCATION NEEDS:   Education needs have been addressed  Skin:  Skin Assessment: Reviewed RN Assessment  Last BM:  11/12  Height:   Ht Readings from Last 1 Encounters:  05/24/22 '5\' 1"'$  (1.549 m)    Weight:   Wt Readings from Last 1 Encounters:  06/05/22 60.6 kg    BMI:  Body mass index is 25.24 kg/m.  Estimated Nutritional Needs:   Kcal:  1700-1900  Protein:  85-100g  Fluid:  1.9L/day  Clayton Bibles, MS, RD, LDN Inpatient Clinical Dietitian Contact information available via Amion

## 2022-06-07 DIAGNOSIS — I3139 Other pericardial effusion (noninflammatory): Secondary | ICD-10-CM | POA: Diagnosis not present

## 2022-06-07 DIAGNOSIS — I890 Lymphedema, not elsewhere classified: Secondary | ICD-10-CM | POA: Diagnosis not present

## 2022-06-07 DIAGNOSIS — R601 Generalized edema: Secondary | ICD-10-CM | POA: Diagnosis not present

## 2022-06-07 DIAGNOSIS — J90 Pleural effusion, not elsewhere classified: Secondary | ICD-10-CM | POA: Diagnosis not present

## 2022-06-07 LAB — GLUCOSE, CAPILLARY
Glucose-Capillary: 113 mg/dL — ABNORMAL HIGH (ref 70–99)
Glucose-Capillary: 145 mg/dL — ABNORMAL HIGH (ref 70–99)
Glucose-Capillary: 177 mg/dL — ABNORMAL HIGH (ref 70–99)
Glucose-Capillary: 193 mg/dL — ABNORMAL HIGH (ref 70–99)

## 2022-06-07 LAB — BASIC METABOLIC PANEL
Anion gap: 9 (ref 5–15)
BUN: 49 mg/dL — ABNORMAL HIGH (ref 8–23)
CO2: 37 mmol/L — ABNORMAL HIGH (ref 22–32)
Calcium: 9.3 mg/dL (ref 8.9–10.3)
Chloride: 84 mmol/L — ABNORMAL LOW (ref 98–111)
Creatinine, Ser: 1.25 mg/dL — ABNORMAL HIGH (ref 0.44–1.00)
GFR, Estimated: 49 mL/min — ABNORMAL LOW (ref >60)
Glucose, Bld: 141 mg/dL — ABNORMAL HIGH (ref 70–99)
Potassium: 3.4 mmol/L — ABNORMAL LOW (ref 3.5–5.1)
Sodium: 130 mmol/L — ABNORMAL LOW (ref 135–145)

## 2022-06-07 LAB — CBC WITH DIFFERENTIAL/PLATELET
Abs Immature Granulocytes: 0.04 10*3/uL (ref 0.00–0.07)
Basophils Absolute: 0.1 10*3/uL (ref 0.0–0.1)
Basophils Relative: 1 %
Eosinophils Absolute: 0.4 10*3/uL (ref 0.0–0.5)
Eosinophils Relative: 6 %
HCT: 35.3 % — ABNORMAL LOW (ref 36.0–46.0)
Hemoglobin: 11.7 g/dL — ABNORMAL LOW (ref 12.0–15.0)
Immature Granulocytes: 1 %
Lymphocytes Relative: 24 %
Lymphs Abs: 1.3 10*3/uL (ref 0.7–4.0)
MCH: 32.7 pg (ref 26.0–34.0)
MCHC: 33.1 g/dL (ref 30.0–36.0)
MCV: 98.6 fL (ref 80.0–100.0)
Monocytes Absolute: 0.7 10*3/uL (ref 0.1–1.0)
Monocytes Relative: 13 %
Neutro Abs: 3.1 10*3/uL (ref 1.7–7.7)
Neutrophils Relative %: 55 %
Platelets: 855 10*3/uL — ABNORMAL HIGH (ref 150–400)
RBC: 3.58 MIL/uL — ABNORMAL LOW (ref 3.87–5.11)
RDW: 13.3 % (ref 11.5–15.5)
WBC: 5.6 10*3/uL (ref 4.0–10.5)
nRBC: 0 % (ref 0.0–0.2)

## 2022-06-07 LAB — TRIGLYCERIDES, BODY FLUIDS: Triglycerides, Fluid: 902 mg/dL

## 2022-06-07 LAB — CYTOLOGY - NON PAP

## 2022-06-07 LAB — HEPATIC FUNCTION PANEL
ALT: 23 U/L (ref 0–44)
AST: 20 U/L (ref 15–41)
Albumin: 3.5 g/dL (ref 3.5–5.0)
Alkaline Phosphatase: 59 U/L (ref 38–126)
Bilirubin, Direct: <0.1 mg/dL (ref 0.0–0.2)
Total Bilirubin: 0.4 mg/dL (ref 0.3–1.2)
Total Protein: 6.9 g/dL (ref 6.5–8.1)

## 2022-06-07 LAB — PATHOLOGIST SMEAR REVIEW

## 2022-06-07 MED ORDER — POTASSIUM CHLORIDE CRYS ER 20 MEQ PO TBCR
40.0000 meq | EXTENDED_RELEASE_TABLET | Freq: Once | ORAL | Status: AC
  Administered 2022-06-07: 40 meq via ORAL
  Filled 2022-06-07: qty 2

## 2022-06-07 NOTE — Assessment & Plan Note (Addendum)
Unremarkable ultrasound neck for suspicious lymph node.

## 2022-06-07 NOTE — Progress Notes (Signed)
Mobility Specialist - Progress Note   06/07/22 0902  Mobility  Activity Ambulated independently in hallway  Level of Assistance Independent  Assistive Device None  Distance Ambulated (ft) 500 ft  Range of Motion/Exercises Active  Mobility Referral Yes  $Mobility charge 1 Mobility   Pt received in bed and agreeable to mobility. No complaints during mobility. Pt to EOB after session with all needs met.      [facility]

## 2022-06-07 NOTE — Progress Notes (Signed)
PROGRESS NOTE    Alyssa Rhodes  FYB:[id number] DOB: [birth year] DOA: 05/23/2022 PCP: Doretha Sou, MD   Brief Narrative: Alyssa Rhodes is a 61 y.o. female with a history of rheumatoid arthritis, diabetes mellitus type 2, hyperlipidemia, hypertension. Patient presented secondary to right sided abdominal pain. Her pain was managed conservatively but she also had significant anasarca requiring IV diuresis with improvement of edema. During treatment, patient appears to have suffered possible ischemic injury to liver as evident by significantly elevated AST/ALT which is now improving. 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 Low fat diet. Pulmonology consulted.   Assessment and Plan: * Anasarca Chronic. Unclear etiology. Patient evaluated by cardiology with concern for non-cardiogenic etiology. Patient managed with Lasix IV, zaroxolyn and albumin with improvement of edema. 24 urine undetectable. Weight on admission of 67 kg (147.71 lbs) with weight slightly up today at 60.6 kg (133.6 lbs) today. Weight stable. Lasix IV transitioned to torsemide PO. Creatinine trending down now. -Hold torsemide -Daily weights and strict in/out  Bilateral chylothorax Fluid consistent with chylothorax. Right pleural fluid triglyceride level of 992 and cholesterol level of 106, confirming chylothorax and suggests this is not a cholesterol effusion. Patient has received two right thoracentesis, both with milky fluid consistent with chylothorax and one left thoracentesis with similar fluid. No trauma or recent surgeries. No known malignancy. Recent CT chest with contrast and CT abdomen/pelvis without contrast unremarkable for etiology. Diet adjusted; dietitian consulted. Pulmonology consulted on 11/8. Chest x-ray (11/13) with small effusions. -Left sided pleural fluid triglycerides and cholesterol pending -Pulmonology recommendations: Repeat 2-view chest  x-ray on 11/15  Bilateral pleural effusion Secondary to chylothorax  Bilateral leg pain -Continue gabapentin  ACTH deficiency (HCC) Unclear etiology, although patient does have an incidental right adrenal nodularity likely to be an underlying adenoma. Normal cortisol level with low ACTH level of 3. Normal TSH. Patient will need endocrinology follow-up as an outpatient.  Hypokalemia Likely related to diuresis. -Continue potassium supplementation as needed  Abdominal pain Patient previously evaluated for consideration of elective cholecystectomy as an outpatient. General surgery consulted while inpatient. HIDA scan noted to be significant for reduced EF and patent cystic and common bile ducts. Patient without indication for inpatient cholecystectomy, so procedure deferred to outpatient. Abdominal pain appears to be improved from previous.  HTN (hypertension) Normotensive.  Enlarged lymph node in neck Unremarkable ultrasound neck for suspicious lymph node.  Pericardial effusion Noted one echo from 02/2022.  Protein-calorie malnutrition, severe Dietitian recommendations (11/13): -Decrease MCT oil dose to 5 ml, provide QID (20 ml total daily providing ~153 fat kcals) -If pt unable to tolerate MCT oil, may need to consider starting TPN/SMOF lipid infusion to help prevent essential fatty acid deficiency Restricted fat diet to no more than 10g fat per day  Prosource Plus PO QID, each provides 100 kcals and 15g protein Multivitamin with minerals daily Boost Breeze po TID, each supplement provides 250 kcal and 9 grams of protein   Transaminitis Severely elevated AST/ALT up to 2,146/1,035. Presumed ischemic per GI discussion this admission. AST/ALT trending down. -Recheck hepatic function  Rheumatoid arthritis ([place]) Patient previously on methotrexate and was discontinued for planned outpatient cholecystectomy.  Type 2 diabetes mellitus with complication, without long-term current use  of insulin (HCC) Most recent hemoglobin A1C of 7.5%. Uncontrolled with hyperglycemia. -Continue SSI  Reactive thrombocytosis Recurrent. Peak of 970. Down to 855. -Follow-up CBC -Pathologist smear review pending  Hyperkalemia-resolved as of 06/03/2022 Appears to likely have  been erroneous.     DVT prophylaxis: Lovenox Code Status:   Code Status: Full Code Family Communication: None at bedside Disposition Plan: Discharge home pending workup/management of chylothorax. Possibly in 1-3 days pending pulmonology recommendations   Consultants:  General surgery Pulmonology  Procedures:  Thoracentesis  Antimicrobials: None    Subjective: Patient reports improvement of diarrhea. No other concerns.  Objective: BP 115/63 (BP Location: Right Arm)   Pulse 79   Temp 98.3 F (36.8 C) (Oral)   Resp 17   Ht '5\' 1"'$  (1.549 m)   Wt 60.6 kg   SpO2 92%   BMI 25.24 kg/m   Examination:  General exam: Appears calm and comfortable Respiratory system: Clear to auscultation. Respiratory effort normal. Cardiovascular system: S1 & S2 heard, RRR. No murmurs, rubs, gallops or clicks. Gastrointestinal system: Abdomen is nondistended, soft and nontender. Normal bowel sounds heard. Central nervous system: Alert and oriented. No focal neurological deficits. Musculoskeletal: ULE and BLE 2+ pitting edema. No calf tenderness Skin: No cyanosis. No rashes Psychiatry: Judgement and insight appear normal. Mood & affect appropriate.    Data Reviewed: I have personally reviewed following labs and imaging studies  CBC Lab Results  Component Value Date   WBC 7.8 06/06/2022   RBC 4.56 06/06/2022   HGB 14.8 06/06/2022   HCT 44.9 06/06/2022   MCV 98.5 06/06/2022   MCH 32.5 06/06/2022   PLT 887 (H) 06/06/2022   MCHC 33.0 06/06/2022   RDW 13.4 06/06/2022   LYMPHSABS 1.2 06/06/2022   MONOABS 0.6 06/06/2022   EOSABS 0.3 06/06/2022   BASOSABS 0.1 16/96/7893     Last metabolic panel Lab Results   Component Value Date   NA 130 (L) 06/07/2022   K 3.4 (L) 06/07/2022   CL 84 (L) 06/07/2022   CO2 37 (H) 06/07/2022   BUN 49 (H) 06/07/2022   CREATININE 1.25 (H) 06/07/2022   GLUCOSE 141 (H) 06/07/2022   GFRNONAA 49 (L) 06/07/2022   GFRAA >60 12/17/2019   CALCIUM 9.3 06/07/2022   PHOS 3.7 05/24/2022   PROT 6.5 06/01/2022   ALBUMIN 3.5 06/01/2022   LABGLOB 2.8 03/17/2022   AGRATIO 1.1 03/17/2022   BILITOT 0.5 06/01/2022   ALKPHOS 93 06/01/2022   AST 53 (H) 06/01/2022   ALT 105 (H) 06/01/2022   ANIONGAP 9 06/07/2022    GFR: Estimated Creatinine Clearance: 39.5 mL/min (A) (by C-G formula based on SCr of 1.25 mg/dL (H)).  Recent Results (from the past 240 hour(s))  Body fluid culture w Gram Stain     Status: None   Collection Time: 06/01/22 11:17 AM   Specimen: PATH Cytology Pleural fluid  Result Value Ref Range Status   Specimen Description   Final    PLEURAL Performed at [facility] [address], [place], [place] [address]    Special Requests   Final    NONE Performed at [facility], [place] [address], [place], [place] [address]    Gram Stain NO WBC SEEN NO ORGANISMS SEEN   Final   Culture   Final    NO GROWTH 3 DAYS Performed at [facility], [place] [address], [place], [place] [address]    Report Status 06/05/2022 FINAL  Final  Culture, body fluid w Gram Stain-bottle     Status: None (Preliminary result)   Collection Time: 06/03/22  3:02 PM   Specimen: Pleura  Result Value Ref Range Status   Specimen Description PLEURAL  Final   Special  Requests NONE  Final   Culture   Final    NO GROWTH 3 DAYS Performed at [facility], [place] [address] [place], [place] [address]    Report Status PENDING  Incomplete  Gram stain     Status: None   Collection Time: 06/03/22  3:02 PM   Specimen: Pleura  Result Value Ref Range Status   Specimen Description PLEURAL  Final   Special Requests NONE  Final   Gram Stain   Final     RARE WBC PRESENT, PREDOMINANTLY MONONUCLEAR NO ORGANISMS SEEN Performed at [facility], [place] [address], [place], [place] [address]    Report Status 06/03/2022 FINAL  Final      Radiology Studies: DG Chest 2 View  Result Date: 06/06/2022 CLINICAL DATA:  Chylothorax. EXAM: CHEST - 2 VIEW COMPARISON:  June 03, 2022. FINDINGS: The heart size and mediastinal contours are within normal limits. Minimal bibasilar subsegmental atelectasis is noted with small pleural effusions. The visualized skeletal structures are unremarkable. IMPRESSION: Minimal bibasilar subsegmental atelectasis is noted with small bilateral pleural effusions. Electronically Signed   By: Marijo Conception M.D.   On: 06/06/2022 09:59      LOS: 15 days    Cordelia Poche, MD [facility] 06/07/2022, 7:42 AM   If 7PM-7AM, please contact night-coverage [email or web address]

## 2022-06-07 NOTE — Progress Notes (Signed)
Mobility Specialist - Progress Note   06/07/22 1327  Mobility  Activity Ambulated independently in hallway  Level of Assistance Independent  Assistive Device None  Distance Ambulated (ft) 500 ft  Activity Response Tolerated well  Mobility Referral Yes  $Mobility charge 1 Mobility   Pt received in recliner and agreeable to mobility. No complaints during mobility. Pt to recliner after session with all needs met.      [facility]

## 2022-06-07 NOTE — Progress Notes (Signed)
Met with patient and family members bedside, sharing in conversation. Patient shared her doctors's report and how hopeful she was for treatment and prognosis. Provided presence and support.

## 2022-06-08 ENCOUNTER — Telehealth: Payer: Self-pay | Admitting: Critical Care Medicine

## 2022-06-08 ENCOUNTER — Inpatient Hospital Stay (HOSPITAL_COMMUNITY): Payer: BC Managed Care – PPO

## 2022-06-08 DIAGNOSIS — J940 Chylous effusion: Secondary | ICD-10-CM | POA: Diagnosis not present

## 2022-06-08 DIAGNOSIS — D75838 Other thrombocytosis: Secondary | ICD-10-CM

## 2022-06-08 DIAGNOSIS — E230 Hypopituitarism: Secondary | ICD-10-CM | POA: Diagnosis not present

## 2022-06-08 DIAGNOSIS — J90 Pleural effusion, not elsewhere classified: Secondary | ICD-10-CM | POA: Diagnosis not present

## 2022-06-08 DIAGNOSIS — M069 Rheumatoid arthritis, unspecified: Secondary | ICD-10-CM | POA: Diagnosis not present

## 2022-06-08 DIAGNOSIS — R601 Generalized edema: Secondary | ICD-10-CM | POA: Diagnosis not present

## 2022-06-08 LAB — CBC WITH DIFFERENTIAL/PLATELET
Abs Immature Granulocytes: 0.05 10*3/uL (ref 0.00–0.07)
Basophils Absolute: 0 10*3/uL (ref 0.0–0.1)
Basophils Relative: 1 %
Eosinophils Absolute: 0.2 10*3/uL (ref 0.0–0.5)
Eosinophils Relative: 3 %
HCT: 41 % (ref 36.0–46.0)
Hemoglobin: 13.7 g/dL (ref 12.0–15.0)
Immature Granulocytes: 1 %
Lymphocytes Relative: 22 %
Lymphs Abs: 1.2 10*3/uL (ref 0.7–4.0)
MCH: 32 pg (ref 26.0–34.0)
MCHC: 33.4 g/dL (ref 30.0–36.0)
MCV: 95.8 fL (ref 80.0–100.0)
Monocytes Absolute: 0.5 10*3/uL (ref 0.1–1.0)
Monocytes Relative: 9 %
Neutro Abs: 3.6 10*3/uL (ref 1.7–7.7)
Neutrophils Relative %: 64 %
Platelets: 472 10*3/uL — ABNORMAL HIGH (ref 150–400)
RBC: 4.28 MIL/uL (ref 3.87–5.11)
RDW: 13 % (ref 11.5–15.5)
WBC: 5.5 10*3/uL (ref 4.0–10.5)
nRBC: 0 % (ref 0.0–0.2)

## 2022-06-08 LAB — CHOLESTEROL, BODY FLUID: Cholesterol, Fluid: 111 mg/dL

## 2022-06-08 LAB — GLUCOSE, CAPILLARY
Glucose-Capillary: 125 mg/dL — ABNORMAL HIGH (ref 70–99)
Glucose-Capillary: 143 mg/dL — ABNORMAL HIGH (ref 70–99)
Glucose-Capillary: 104 mg/dL — ABNORMAL HIGH (ref 70–99)

## 2022-06-08 LAB — BASIC METABOLIC PANEL
Anion gap: 13 (ref 5–15)
BUN: 47 mg/dL — ABNORMAL HIGH (ref 8–23)
CO2: 34 mmol/L — ABNORMAL HIGH (ref 22–32)
Calcium: 9.6 mg/dL (ref 8.9–10.3)
Chloride: 86 mmol/L — ABNORMAL LOW (ref 98–111)
Creatinine, Ser: 1.13 mg/dL — ABNORMAL HIGH (ref 0.44–1.00)
GFR, Estimated: 55 mL/min — ABNORMAL LOW (ref >60)
Glucose, Bld: 172 mg/dL — ABNORMAL HIGH (ref 70–99)
Potassium: 3.3 mmol/L — ABNORMAL LOW (ref 3.5–5.1)
Sodium: 133 mmol/L — ABNORMAL LOW (ref 135–145)

## 2022-06-08 LAB — CULTURE, BODY FLUID W GRAM STAIN -BOTTLE: Culture: NO GROWTH

## 2022-06-08 MED ORDER — OCTREOTIDE ACETATE 50 MCG/ML IJ SOLN: 50.0000 ug | Freq: Three times a day (TID) | INTRAMUSCULAR | 0 refills | Status: AC

## 2022-06-08 MED ORDER — SYRINGE (DISPOSABLE) 1 ML MISC: 30.0000 | 0 refills | Status: DC

## 2022-06-08 MED ORDER — TORSEMIDE 20 MG PO TABS: 40.0000 mg | ORAL_TABLET | Freq: Two times a day (BID) | ORAL | 1 refills | Status: AC

## 2022-06-08 MED ORDER — "BD SAFETYGLIDE NEEDLE 25G X 1"" MISC": 30.0000 | 0 refills | Status: DC

## 2022-06-08 NOTE — Progress Notes (Signed)
Mobility Specialist - Progress Note   06/08/22 0917  Mobility  Activity Ambulated independently in hallway  Level of Assistance Independent  Assistive Device None  Distance Ambulated (ft) 500 ft  Activity Response Tolerated well  Mobility Referral Yes  $Mobility charge 1 Mobility   Pt received in bed and agreeable to mobility. No complaints during mobility. Pt to bed after session with all needs met.      [facility]

## 2022-06-08 NOTE — Discharge Summary (Signed)
Physician Discharge Summary   Patient: Alyssa Rhodes MRN: [id number] DOB: January 07, 1961  Admit date:     05/23/2022  Discharge date: 06/08/2022  Discharge Physician: Vernelle Emerald   PCP: Doretha Sou, MD   Recommendations at discharge:   Please take all medications as previously instructed including your subcutaneous Sandostatin 3 times daily until you follow-up with pulmonology Please go to all follow-up appointments including with [facility] pulmonology, they will contact you with an appointment.  You will also be contacted for a follow-up appointment with Dr. Lindi Adie with oncology for further work-up of your persisting edema. Please take all medications as instructed including your daily regimen of torsemide. Please weigh yourself daily and if you experience rapid weight gain in excess of 5 pounds over the course of a week contact your outpatient provider or come to the emergency department for evaluation. If you have increasing chest pain, increasing weakness or shortness of breath, the emergency department for repeat evaluation.  Discharge Diagnoses: Principal Problem:   Anasarca Active Problems:   Bilateral pleural effusion   Bilateral chylothorax   HTN (hypertension)   Abdominal pain   Hypokalemia   ACTH deficiency (HCC)   Bilateral leg pain   Reactive thrombocytosis   Type 2 diabetes mellitus with complication, without long-term current use of insulin (HCC)   Rheumatoid arthritis (HCC)   Transaminitis   Protein-calorie malnutrition, severe   Gallbladder disease   Pericardial effusion   Lymphedema of left upper extremity   Enlarged lymph node in neck  Resolved Problems:   Hyperkalemia   Hospital Course: 61 y.o. 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  Attempts to diurese the patient in the outpatient setting with  torsemide were unsuccessful.  Patient presented to [facility] emergency department with progressive weight gain and right upper quadrant pain as well as diffuse swelling.  The hospitalist group was then called to assess the patient for admission to the hospital.  Patient underwent extensive work-up to identify the cause of the patient's diffuse third spacing.  Urinalysis revealed no evidence of significant proteinuria.  Albumin was normal despite serial hepatic function panels.  Echocardiogram revealed a normal ejection fraction of 60 to 65%.  Considering patient's substantial abdominal pain patient was evaluated by surgery who felt that the patient's gallbladder was not the cause of her symptoms.   During this hospitalization patient was found to have persisting bilateral pleural effusions.  Patient underwent  right thoracentesis 10/31 by IR which yielded 1.3L of chylous appearing fluid. She again had 2nd right thoracentesis on 11/8 that again yielded 850cc of chylous appearing fluid. Cytology from [facility] showed reactive mesothelial cells.  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    Pulmonology additionally assisted with managing this patient throughout the hospitalization and recommended Sandostatin subcutaneous every 8 hours for management.  Despite these extensive diagnostic efforts, the patient did clinically improve with substantial diuresis.  Patient was initially managed with intravenous Lasix and eventually transitioned back to oral torsemide in the final days of her hospitalization.  Patient's strength and mobility dramatically improved, approaching baseline.    Case was once again discussed with Oncology (Dr. Lindi Adie) prior to discharge.  Considering the extensive work-up above, there is a recurrent concern for an underlying insidious malignancy.  Dr. Lindi Adie agreed to  have  the patient follow-up with him in clinic for further evaluation and potential work-up for an underlying malignant process.  She is being managed by [facility]; however, due to her suspected chylothoraces, PCCM asked to see in consultation. Per last [facility] note from 11/7, discussion with rheumatology was had and they did not have any rheumatologic reason to explain her anasarca either. A 24h urine protein collection in progess     Pain control - [place] Controlled Substance Reporting System database was reviewed. and patient was instructed, not to drive, operate heavy machinery, perform activities at heights, swimming or participation in water activities or provide baby-sitting services while on Pain, Sleep and Anxiety Medications; until their outpatient Physician has advised to do so again. Also recommended to not to take more than prescribed Pain, Sleep and Anxiety Medications.   Consultants: Dr. Carlis Abbott with Pulmonology.  Dr. Thermon Leyland with Surgery.   Procedures performed: Right thoracocentesis on 10/31.  Left thoracentesis on 11/8. Disposition: Home Diet recommendation:  Discharge Diet Orders (From admission, onward)     Start     Ordered   06/08/22 0000  Diet - low sodium heart healthy        06/08/22 1555           Cardiac diet  DISCHARGE MEDICATION: Allergies as of 06/08/2022       Reactions   Codeine Nausea And Vomiting, Nausea Only   Hydroxychloroquine Rash        Medication List     STOP taking these medications    atorvastatin 40 MG tablet Commonly known as: LIPITOR   furosemide 40 MG tablet Commonly known as: LASIX   metFORMIN 500 MG tablet Commonly known as: Glucophage   methotrexate 2.5 MG tablet Commonly known as: RHEUMATREX   polyethylene glycol 17 g packet Commonly known as: MIRALAX / GLYCOLAX       TAKE these medications    BD SafetyGlide Needle 25G X 1" Misc Generic drug: NEEDLE (DISP) 25 G 30 Needles by Does not apply route as  directed.   estradiol 0.1 MG/GM vaginal cream Commonly known as: ESTRACE Place 1 Applicatorful vaginally 3 (three) times a week.   FISH OIL PO Take 1 capsule by mouth in the morning and at bedtime.   gabapentin 300 MG capsule Commonly known as: NEURONTIN Take 1 capsule (300 mg total) by mouth daily.   multivitamin tablet Take 1 tablet by mouth daily with breakfast.   octreotide 50 MCG/ML Soln injection Commonly known as: SANDOSTATIN Inject 1 mL (50 mcg total) into the skin every 8 (eight) hours.   omeprazole 40 MG capsule Commonly known as: PRILOSEC Take 40 mg by mouth daily before breakfast.   ondansetron 4 MG tablet Commonly known as: ZOFRAN Take 4 mg by mouth 3 (three) times daily as needed for nausea or vomiting.   oxyCODONE 5 MG immediate release tablet Commonly known as: Oxy IR/ROXICODONE Take 1 tablet (5 mg total) by mouth every 4 (four) hours as needed for moderate pain.   potassium chloride 20 MEQ packet Commonly known as: KLOR-CON Take 20 mEq by mouth daily.   Syringe (Disposable) 1 ML Misc 30 Syringes by Does not apply route as directed.   torsemide 20 MG tablet Commonly known as: DEMADEX Take 2 tablets (40 mg total) by mouth 2 (two) times daily. What changed: when to take this   VITAMIN C PO Take 1 tablet by mouth daily.        Follow-up Information     Doretha Sou,  MD. Schedule an appointment as soon as possible for a visit in 1 week(s).   Specialty: Internal Medicine Contact information: [place] [address] [phone]         [facility] Follow up.   Specialty: Pulmonology Why: You will be contacted with a follow up appointment in 2-4 weeks Contact information: [place] [id number] [facility] Follow up.   Specialty: Oncology Why: You will be contacted witha  follow up appointment within the  next week Contact information: [place] [phone] [place] [address] [phone]                Discharge Exam: Filed Weights   06/04/22 0422 06/05/22 0500 06/08/22 0615  Weight: 62 kg 60.6 kg 60.9 kg   Constitutional: Awake alert and oriented x3, no associated distress.   Respiratory: clear to auscultation bilaterally, no wheezing, no crackles. Normal respiratory effort. No accessory muscle use.  Cardiovascular: Regular rate and rhythm, no murmurs / rubs / gallops. No extremity edema. 2+ pedal pulses. No carotid bruits.  Abdomen: Abdomen is soft and nontender.  No evidence of intra-abdominal masses.  Positive bowel sounds noted in all quadrants.   Musculoskeletal: No joint deformity upper and lower extremities. Good ROM, no contractures. Normal muscle tone.     Condition at discharge: fair  The results of significant diagnostics from this hospitalization (including imaging, microbiology, ancillary and laboratory) are listed below for reference.   Imaging Studies: DG Chest 2 View  Result Date: 06/08/2022 CLINICAL DATA:  Chylothorax. EXAM: CHEST - 2 VIEW COMPARISON:  June 06, 2022. FINDINGS: The heart size and mediastinal contours are within normal limits. Minimal bibasilar subsegmental atelectasis is noted with minimal bilateral pleural effusions. The visualized skeletal structures are unremarkable. IMPRESSION: Stable minimal bibasilar subsegmental atelectasis with minimal bilateral pleural effusions. Electronically Signed   By: Marijo Conception M.D.   On: 06/08/2022 11:34   US SOFT TISSUE HEAD & NECK (NON-THYROID)  Result Date: 06/07/2022 CLINICAL DATA:  Enlarged lymph node in the neck EXAM: ULTRASOUND OF HEAD/NECK SOFT TISSUES TECHNIQUE: Ultrasound examination of the head and neck soft tissues was performed in the area of clinical concern. COMPARISON:  None Available. FINDINGS: Scanning in the region of concern in the right posterior neck  confirms the presence of a 5 x 2 x 4 mm lymph node. This could be palpable but does not appear pathologic by size or sonographic criteria. No hyperemia. No other regional finding. IMPRESSION: Small lymph node in the region of concern. This could be palpable but does not appear pathologic by size or sonographic criteria. Electronically Signed   By: Nelson Chimes M.D.   On: 06/07/2022 08:34   DG Chest 2 View  Result Date: 06/06/2022 CLINICAL DATA:  Chylothorax. EXAM: CHEST - 2 VIEW COMPARISON:  June 03, 2022. FINDINGS: The heart size and mediastinal contours are within normal limits. Minimal bibasilar subsegmental atelectasis is noted with small pleural effusions. The visualized skeletal structures are unremarkable. IMPRESSION: Minimal bibasilar subsegmental atelectasis is noted with small bilateral pleural effusions. Electronically Signed   By: Marijo Conception M.D.   On: 06/06/2022 09:59   US THORACENTESIS ASP PLEURAL SPACE W/IMG GUIDE  Result Date: 06/03/2022 INDICATION: Patient with history of chylothorax, rheumatoid arthritis, anasarca; request received for diagnostic and therapeutic left thoracentesis. EXAM: ULTRASOUND GUIDED DIAGNOSTIC AND THERAPEUTIC LEFT THORACENTESIS MEDICATIONS:  8 mL 1% lidocaine COMPLICATIONS: None immediate. PROCEDURE: An ultrasound guided thoracentesis was thoroughly discussed with the patient and questions answered. The benefits, risks, alternatives and complications were also discussed. The patient understands and wishes to proceed with the procedure. Written consent was obtained. Ultrasound was performed to localize and mark an adequate pocket of fluid in the left chest. The area was then prepped and draped in the normal sterile fashion. 1% Lidocaine was used for local anesthesia. Under ultrasound guidance a 6 Fr Safe-T-Centesis catheter was introduced. Thoracentesis was performed. The catheter was removed and a dressing applied. FINDINGS: A total of approximately 420 cc  of chylous fluid was removed. Samples were sent to the laboratory as requested by the clinical team. IMPRESSION: Successful ultrasound guided diagnostic and therapeutic left thoracentesis yielding 420 cc of pleural fluid. Read by: Rowe Robert, PA-C Electronically Signed   By: Miachel Roux M.D.   On: 06/03/2022 15:35   DG Chest 1 View  Result Date: 06/03/2022 CLINICAL DATA:  Status post left thoracentesis, pleural effusion EXAM: CHEST  1 VIEW COMPARISON:  06/01/2022 FINDINGS: Single frontal view of the chest demonstrates a stable cardiac silhouette. There has been complete resolution of the left pleural effusion after interval thoracentesis. No evidence of pneumothorax. Trace right pleural effusion is again noted. No acute airspace disease. No acute bony abnormalities. IMPRESSION: 1. No complication after left thoracentesis, with resolution of the left pleural effusion. 2. Trace right pleural effusion. Electronically Signed   By: Randa Ngo M.D.   On: 06/03/2022 15:17   CT CHEST W CONTRAST  Result Date: 06/02/2022 CLINICAL DATA:  Pleural effusion EXAM: CT CHEST WITH CONTRAST TECHNIQUE: Multidetector CT imaging of the chest was performed during intravenous contrast administration. RADIATION DOSE REDUCTION: This exam was performed according to the departmental dose-optimization program which includes automated exposure control, adjustment of the mA and/or kV according to patient size and/or use of iterative reconstruction technique. CONTRAST:  80m OMNIPAQUE IOHEXOL 300 MG/ML  SOLN COMPARISON:  Previous CT done on 03/17/2022 and chest radiograph done on 06/01/2022 FINDINGS: Cardiovascular: No acute findings are seen. There is homogeneous enhancement in thoracic aorta. There are no filling defects in central pulmonary artery branches. Mediastinum/Nodes: Subcentimeter nodes in mediastinum appear stable. Lungs/Pleura: There is interval decrease in right pleural effusion and increase in left pleural effusion.  There is no pneumothorax. Pleural densities in both apices appear stable. Infiltrate in left lower lobe may suggest atelectasis/pneumonia. Increased interstitial markings are seen in the posterior right lower lung field. Centrilobular emphysema is seen. Upper Abdomen: There is 1.7 cm nodule in right adrenal with no significant change, possibly adenoma. No follow-up imaging is recommended. Musculoskeletal: No acute findings are seen. There is diffuse edema in subcutaneous plane suggesting anasarca. IMPRESSION: There is interval decrease in right pleural effusion and interval increase in left pleural effusion. There is no pneumothorax. Patchy infiltrates are noted in right lower lung fields. There is dense infiltrate in left lower lung fields. Findings suggest atelectasis/pneumonia in both lower lung fields. There is no evidence of thoracic aortic dissection. There is no evidence of central pulmonary artery embolism. Other findings as described in the body of the report. Electronically Signed   By: PElmer PickerM.D.   On: 06/02/2022 09:13   [place]OR[place] ASP PLEURAL SPACE W/IMG GUIDE  Result Date: 06/01/2022 INDICATION: Patient with history of RA, DM type 2, HTN and recurrent right pleural effusion. Request received for diagnostic and therapeutic right thoracentesis. EXAM: ULTRASOUND GUIDED DIAGNOSTIC AND THERAPEUTIC RIGHT  THORACENTESIS MEDICATIONS: 10 mL 1 % lidocaine COMPLICATIONS: None immediate. PROCEDURE: An ultrasound guided thoracentesis was thoroughly discussed with the patient and questions answered. The benefits, risks, alternatives and complications were also discussed. The patient understands and wishes to proceed with the procedure. Written consent was obtained. Ultrasound was performed to localize and mark an adequate pocket of fluid in the right chest. The area was then prepped and draped in the normal sterile fashion. 1% Lidocaine was used for local anesthesia. Under ultrasound guidance a  6 Fr Safe-T-Centesis catheter was introduced. Thoracentesis was performed. The catheter was removed and a dressing applied. FINDINGS: A total of approximately 850 cc of chylous fluid was removed. Samples were sent to the laboratory as requested by the clinical team. IMPRESSION: Successful ultrasound guided right thoracentesis yielding 850 cc of pleural fluid. Read by: Narda Rutherford, AGNP-BC Electronically Signed   By: Ruthann Cancer M.D.   On: 06/01/2022 12:03   DG CHEST PORT 1 VIEW  Result Date: 06/01/2022 CLINICAL DATA:  Post right thoracentesis EXAM: PORTABLE CHEST 1 VIEW COMPARISON:  Chest 05/30/2022 FINDINGS: Near complete clearing of right pleural effusion. Improved aeration right lung base. No pneumothorax. No change in left pleural effusion and left lower lobe atelectasis. Negative for heart failure. IMPRESSION: 1. Near complete clearing of right pleural effusion following thoracentesis. No pneumothorax. 2. Left pleural effusion and left lower lobe atelectasis unchanged. Electronically Signed   By: Franchot Gallo M.D.   On: 06/01/2022 11:25   MR BRAIN W WO CONTRAST  Result Date: 05/31/2022 EXAM: MRI HEAD WITHOUT AND WITH CONTRAST TECHNIQUE: Multiplanar, multiecho pulse sequences of the brain and surrounding structures were obtained without and with intravenous contrast. CONTRAST:  66m GADAVIST GADOBUTROL 1 MMOL/ML IV SOLN COMPARISON:  None Available. FINDINGS: Brain: No acute infarction, hemorrhage, hydrocephalus, extra-axial collection or mass lesion. Scattered and confluent foci of T2 hyperintensity are seen within the white the cerebral hemispheres, nonspecific. No focus of abnormal contrast enhancement identified. Vascular: Normal flow voids. Skull and upper cervical spine: Normal marrow signal. Sinuses/Orbits: Negative. Other: None. IMPRESSION: 1. Mild-to-moderate amount of T2 hyperintense lesions of the white matter in a nonspecific distribution. Differential diagnosis include chronic  microangiopathy, demyelinating disease, post inflammatory/infectious processes and other autoimmune diseases. 2. No pituitary tumor identified in this routine protocol brain MRI. Electronically Signed   By: KPedro EarlsM.D.   On: 05/31/2022 16:27   DG Chest 2 View  Result Date: 05/30/2022 CLINICAL DATA:  Recurrent pleural effusion. EXAM: CHEST - 2 VIEW COMPARISON:  May 24, 2022. FINDINGS: The heart size and mediastinal contours are within normal limits. Mild bibasilar atelectasis or edema is noted with associated pleural effusions. The visualized skeletal structures are unremarkable. IMPRESSION: Mild bibasilar atelectasis or edema is noted with mild bilateral pleural effusions. Electronically Signed   By: JMarijo ConceptionM.D.   On: 05/30/2022 11:47   [place]IVER DOPPLER LIMITED (PV OR SINGLE ART/VEIN)  Result Date: 05/28/2022 CLINICAL DATA:  Elevated LFTs, evaluate portal vein patency EXAM: DUPLEX ULTRASOUND OF LIVER TECHNIQUE: Color and duplex Doppler ultrasound was performed to evaluate the hepatic in-flow and out-flow vessels. COMPARISON:  None Available. FINDINGS: Liver: Normal parenchymal echogenicity. Normal hepatic contour without nodularity. No focal lesion, mass or intrahepatic biliary ductal dilatation. Color Doppler and spectral Doppler was performed in the left and right intrahepatic portal vein, as well as within 3 segments of the main portal vein. The portal vein remains patent. Peak velocities were not recorded. IMPRESSION: Patent portal vein. Electronically  Signed   By: Julian Hy M.D.   On: 05/28/2022 19:23   US Abdomen Limited RUQ (LIVER/GB)  Result Date: 05/28/2022 CLINICAL DATA:  Elevated liver function tests. EXAM: ULTRASOUND ABDOMEN LIMITED RIGHT UPPER QUADRANT COMPARISON:  05/24/2022 FINDINGS: Gallbladder: Interval tumefactive sludge obscuring the previously demonstrated 7 mm gallbladder polyp. No gallstones, gallbladder wall thickening or pericholecystic  fluid. No sonographic Murphy sign. Common bile duct: Diameter: 5.0 mm proximally. Liver: No focal lesion identified. Within normal limits in parenchymal echogenicity. Portal vein is patent on color Doppler imaging with normal direction of blood flow towards the liver. Other: Moderate-sized right pleural effusion with associated right lower lobe atelectasis. IMPRESSION: 1. Interval tumefactive sludge obscuring the previously demonstrated 7 mm gallbladder polyp. 2. No biliary ductal dilatation. 3. Normal sonographic appearance of the liver. 4. Moderate-sized right pleural effusion with associated right lower lobe atelectasis. Electronically Signed   By: Claudie Revering M.D.   On: 05/28/2022 17:58   US THORACENTESIS ASP PLEURAL SPACE W/IMG GUIDE  Result Date: 05/24/2022 INDICATION: Patient with a right pleural effusion of unknown etiology. Interventional radiology asked to perform a diagnostic and therapeutic thoracentesis. EXAM: ULTRASOUND GUIDED THORACENTESIS MEDICATIONS: 1% lidocaine 10 mL COMPLICATIONS: None immediate. PROCEDURE: An ultrasound guided thoracentesis was thoroughly discussed with the patient and questions answered. The benefits, risks, alternatives and complications were also discussed. The patient understands and wishes to proceed with the procedure. Written consent was obtained. Ultrasound was performed to localize and mark an adequate pocket of fluid in the right chest. The area was then prepped and draped in the normal sterile fashion. 1% Lidocaine was used for local anesthesia. Under ultrasound guidance a 6 Fr Safe-T-Centesis catheter was introduced. Thoracentesis was performed. The catheter was removed and a dressing applied. FINDINGS: A total of approximately 1.3 L of chylous fluid was removed. Samples were sent to the laboratory as requested by the clinical team. IMPRESSION: Successful ultrasound guided RIGHT thoracentesis yielding 1.3 L of pleural fluid. Read by: Soyla Dryer, NP  Electronically Signed   By: Michaelle Birks M.D.   On: 05/24/2022 19:06   DG CHEST PORT 1 VIEW  Result Date: 05/24/2022 CLINICAL DATA:  Status post thoracentesis, right-sided. EXAM: PORTABLE CHEST 1 VIEW COMPARISON:  Chest x-ray 05/23/2022 FINDINGS: There is a trace right pleural effusion, unchanged. There is no pneumothorax. Small left pleural effusion has mildly increased. The cardiomediastinal silhouette is within normal limits. No acute fractures are seen. IMPRESSION: 1. Stable trace right pleural effusion. No pneumothorax. 2. Small left pleural effusion has mildly increased. Electronically Signed   By: Ronney Asters M.D.   On: 05/24/2022 16:12   US Abdomen Limited RUQ (LIVER/GB)  Result Date: 05/24/2022 CLINICAL DATA:  Transaminitis EXAM: ULTRASOUND ABDOMEN LIMITED RIGHT UPPER QUADRANT COMPARISON:  CT 05/23/2022 FINDINGS: Gallbladder: Non mobile nonshadowing echogenic focus along the gallbladder wall, likely 7 mm polyp. No wall thickening or sonographic Murphy sign. Common bile duct: Diameter: Mildly dilated, 9 mm.  No visible ductal stones. Liver: No focal lesion identified. Within normal limits in parenchymal echogenicity. Portal vein is patent on color Doppler imaging with normal direction of blood flow towards the liver. Other: Right pleural effusion noted. IMPRESSION: 7 mm gallbladder wall polyp. No visible stones or evidence of acute cholecystitis. Dilated common bile duct measuring up to 9 mm. No visible ductal stones. This could be further evaluated with MRCP if felt clinically indicated. Electronically Signed   By: Rolm Baptise M.D.   On: 05/24/2022 01:10   DG Chest 2 View  Result Date: 05/23/2022 CLINICAL DATA:  fluid retention, dyspnea on exertion EXAM: CHEST - 2 VIEW COMPARISON:  Radiograph 04/06/2022 FINDINGS: Unchanged cardiomediastinal silhouette. There are moderate right and small left pleural effusions with adjacent basilar consolidation. No evidence of pneumothorax. No acute  osseous abnormality. IMPRESSION: Moderate right and small left pleural effusions with adjacent basilar consolidation, likely atelectasis. Electronically Signed   By: Maurine Simmering M.D.   On: 05/23/2022 13:27   CT ABDOMEN PELVIS WO CONTRAST  Result Date: 05/23/2022 CLINICAL DATA:  Pain.  Fluid in arms and legs.  "Gallbladder flare". EXAM: CT ABDOMEN AND PELVIS WITHOUT CONTRAST TECHNIQUE: Multidetector CT imaging of the abdomen and pelvis was performed following the standard protocol without IV contrast. RADIATION DOSE REDUCTION: This exam was performed according to the departmental dose-optimization program which includes automated exposure control, adjustment of the mA and/or kV according to patient size and/or use of iterative reconstruction technique. COMPARISON:  04/06/2022 FINDINGS: Lower chest: Centrilobular emphysema. Bibasilar atelectasis. Moderate, right larger than left pleural effusions are increased. Normal heart size. Hepatobiliary: Normal noncontrast appearance of the liver. The gallbladder is borderline distended, without calcified stone or specific evidence of acute cholecystitis. No biliary duct dilatation. Pancreas: Normal, without mass or ductal dilatation. Spleen: Normal in size, without focal abnormality. Adrenals/Urinary Tract: Normal left adrenal gland. Right adrenal nodularity, including at up to 1.4 cm and -3 HU on 22/4, most consistent with an adenoma. Suspect punctate lower pole right renal collecting system stone on 72/6 and 66/7. No hydroureter or ureteric calculi. No bladder calculi. Stomach/Bowel: Normal stomach, without wall thickening. Normal colon, appendix, and terminal ileum. Normal small bowel. Vascular/Lymphatic: Aortic atherosclerosis. No abdominopelvic adenopathy. Reproductive: Hysterectomy.  No adnexal mass. Other: Trace free pelvic fluid, similar. No free intraperitoneal air. Diffuse anasarca Musculoskeletal: No acute osseous abnormality. IMPRESSION: 1. Probable punctate  right nephrolithiasis. No other explanation for abdominal pain. 2. Low sensitivity exam secondary to lack of oral or IV contrast. 3. Increase in moderate bilateral pleural effusions with persistent diffuse anasarca and trace pelvic fluid. Findings again suggest fluid overload/third-spacing. 4. Aortic atherosclerosis (ICD10-I70.0) and emphysema (ICD10-J43.9). 5. Right adrenal nodularity favored to be secondary to an underlying adenoma . In the absence of clinically indicated signs/symptoms require(s) no independent follow-up. Electronically Signed   By: Abigail Miyamoto M.D.   On: 05/23/2022 13:25    Microbiology: Results for orders placed or performed during the hospital encounter of 05/23/22  Acid Fast Smear (AFB)     Status: None   Collection Time: 05/24/22  3:55 PM   Specimen: PATH Cytology Pleural fluid  Result Value Ref Range Status   AFB Specimen Processing Concentration  Final   Acid Fast Smear Negative  Final    Comment: (NOTE) Performed At: [facility] [address] [place], [place] [id number] Rush Farmer MD XN:[id number]    Source (AFB) PLEURAL  Final    Comment: Performed at [facility], [place] [address], [place], [place] [address]  Body fluid culture w Gram Stain     Status: None   Collection Time: 05/24/22  3:55 PM   Specimen: PATH Cytology Pleural fluid  Result Value Ref Range Status   Specimen Description   Final    PLEURAL Performed at [place] [address], [place], [place] [address]    Special Requests   Final    NONE Performed at [facility], [place] [address], [place], [place] [address]    Gram Stain   Final  RARE WBC PRESENT, PREDOMINANTLY MONONUCLEAR NO ORGANISMS SEEN    Culture   Final    NO GROWTH 3 DAYS Performed at [facility], [place] [address], [place], [place] [address]    Report Status 05/28/2022 FINAL  Final  Culture, body fluid w Gram Stain-bottle     Status: None    Collection Time: 06/03/22  3:02 PM   Specimen: Pleura  Result Value Ref Range Status   Specimen Description PLEURAL  Final   Special Requests NONE  Final   Culture   Final    NO GROWTH 5 DAYS Performed at [facility] [address] [place], [place] [address]    Report Status 06/08/2022 FINAL  Final  Gram stain     Status: None   Collection Time: 06/03/22  3:02 PM   Specimen: Pleura  Result Value Ref Range Status   Specimen Description PLEURAL  Final   Special Requests NONE  Final   Gram Stain   Final    RARE WBC PRESENT, PREDOMINANTLY MONONUCLEAR NO ORGANISMS SEEN Performed at [facility], [place] [address], [place], [place] [address]    Report Status 06/03/2022 FINAL  Final    Labs: CBC: Recent Labs  Lab 06/03/22 0619 06/06/22 0856 06/06/22 1051 06/06/22 1052 06/07/22 0554 06/08/22 1255  WBC 5.6 7.2 7.8  --  5.6 5.5  NEUTROABS  --   --  5.6  --  3.1 3.6  HGB 14.4 13.2 14.8  --  11.7* 13.7  HCT 43.6 40.3 44.9  --  35.3* 41.0  MCV 97.8 98.1 98.5  --  98.6 95.8  PLT 539* 932* 970* 887* 855* 309*   Basic Metabolic Panel: Recent Labs  Lab 06/02/22 2122 06/03/22 0619 06/04/22 1208 06/05/22 1328 06/06/22 0856 06/07/22 0554 06/08/22 1255  NA  --    < > 132* 133* 133* 130* 133*  K 2.9*   < > 2.8* 3.3* 3.9 3.4* 3.3*  CL  --    < > 85* 86* 86* 84* 86*  CO2  --    < > 34* 34* 33* 37* 34*  GLUCOSE  --    < > 198* 191* 154* 141* 172*  BUN  --    < > 42* 49* 51* 49* 47*  CREATININE  --    < > 1.05* 1.17* 1.38* 1.25* 1.13*  CALCIUM  --    < > 9.0 9.4 9.3 9.3 9.6  MG 1.9  --   --  2.0  --   --   --    < > = values in this interval not displayed.   Liver Function Tests: Recent Labs  Lab 06/07/22 0554  AST 20  ALT 23  ALKPHOS 59  BILITOT 0.4  PROT 6.9  ALBUMIN 3.5   CBG: Recent Labs  Lab 06/07/22 1624 06/07/22 2037 06/08/22 0741 06/08/22 1204 06/08/22 1611  GLUCAP 177* 193* 125* 143* 104*    Discharge time spent: greater than 30  minutes.  Signed: Vernelle Emerald, MD [facility] 06/08/2022

## 2022-06-08 NOTE — Progress Notes (Signed)
NAME:  Alyssa Rhodes, MRN:  [id number], DOB:  12-Nov-1960, LOS: 19 ADMISSION DATE:  05/23/2022, CONSULTATION DATE:  06/01/22 REFERRING MD:  Lonny Prude CHIEF COMPLAINT:  Abd pain   History of Present Illness:  Alyssa Rhodes is a 61 y.o. female who has a PMH as below including but not limited to RA (was on MTX but recently taken off by rheumatology on 10/24), DM, HTN, HLD, anasarca of unclear etiology. She presented to [facility] 10/30 with RUQ pain, N/V, LE edema. She was recently started on Torsemide for LE edema and volume overload. Abd pain has been chronic but had gotten worse. In fact, she was seen at [facility] for the same and was diagnosed with biliary dyskinesia.  She was evaluated by surgery who felt that gallbladder was not part of her issue (Had HIDA 9/18 with EF18% and patent cystic duct and CBD). She was evaluated by cardiology who did not feel that her anasarca was due to cardiac issues (echo 03/15/22 with EF 60-65%, no RWMA, small effusion. LE duplex also negative for DVT). They recommended heme-onc consult for possible bone marrow biopsy. Surgery has mentioned some concern for lymphatic disease causing her anasarca and chylothoraces (see below).  During this admission, she was found to have bilateral pleural effusions. She had right thoracentesis 10/31 by IR which yielded 1.3L of chylous appearing fluid. She again had 2nd right thora on 11/8 that again yielded 850cc of chylous appearing fluid. Cytology from first thora showed reactive mesothelial cells. Of note, no triglycerides or cholesterol was sent on pleural fluid.  She is being managed by [facility]; however, due to her suspected chylothoraces, PCCM asked to see in consultation. Per last [facility] note from 11/7, discussion with rheumatology was had and they did not have any rheumatologic reason to explain her anasarca either. A 24h urine protein collection in progess  Pertinent  Medical History:  has HTN (hypertension); Tobacco user; Microcytic anemia; Other  fatigue; Deficiency anemia; Iron deficiency anemia; AKI (acute kidney injury) ([place]); Abdominal pain; Reactive thrombocytosis; Leucocytosis; Anasarca; Type 2 diabetes mellitus with complication, without long-term current use of insulin ([place]); Rheumatoid arthritis ([place]); Hypokalemia; Transaminitis; Bilateral pleural effusion; Protein-calorie malnutrition, severe; Bilateral chylothorax; Gallbladder disease; Pericardial effusion; Lymphedema of left upper extremity; 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  Significant Hospital Events: Including procedures, antibiotic start and stop dates in addition to other pertinent events   10/30 admit. 10/31 right thora by IR which yielded 1.3L of chylous fluid. 11/8 2nd right thora by IR which yielded 850cc of chylous fluid. 11/8 PCCM consult. 11/10 Left thoracentesis with 420 cc of chylous fluid  Interim History / Subjective:  Today Alyssa Rhodes denies complaints.  She wants to go home.  Objective:  Blood pressure 100/68, pulse 73, temperature 98 F (36.7 C), temperature source Oral, resp. rate 18, height _0  (1.549 m), weight 60.9 kg, SpO2 95 %.        Intake/Output Summary (Last 24 hours) at 06/08/2022 1335 Last data filed at 06/08/2022 0900 Gross per 24 hour  Intake 840 ml  Output 800 ml  Net 40 ml    Filed Weights   06/04/22 0422 06/05/22 0500 06/08/22 0615  Weight: 62 kg 60.6 kg 60.9 kg    Examination: Gen:      [place] woman sitting up in the recliner no acute distress HEENT: [place]/AT, eyes anicteric Lungs:    Breathing comfortably on room air, CTA B CV:  S1-S2, regular rate and rhythm Abd:      Soft, nontender nondistended  Ext:    Mild ongoing edema. Skin:      No rashes Neuro: Awake and alert, normal speech, answering questions appropriately, moving all extremities Psych: Normal affect, cooperative with exam  Cxr personally reviewed> stable small effusions  bilaterally Cytology of pleural fluid-reactive mesothelial cells UPEP ending Repeat TG on pleural fluid 992  Assessment & Plan:   Bilateral chylothorax Left pleural effusion s/p thora x 2 on 10/31 and 11/8 both with chylous fluid obtained, TG 11/8 was 992 (only checked on 1/3 thora so far) Unclear etiology at this point but potentially related to her RA, although this is very rare. Lymphoma is a potential concern, which she is at increased risk of due to RA history.  No recent surgeries to raise possibility of thoracic duct injury. -Continue dietary modifications - Continue octreotide at home - Follow-up outpatient in the next few weeks with a chest x-ray in pulmonary office.  If she develops symptoms before then she should be seen by her PCP with an x-ray and they can contact pulmonary for closer follow-up.  Alternatively she could present to the ED. - If effusion recurs will need lymphatic imaging with interventional radiology. - Outpatient follow-up requested.  Our office will call her.  Enlarged LN R cervical chain posteriorly-nonpathologic on ultrasound    Best practice (evaluated daily):  Per primary   Julian Hy, DO 06/08/22 6:39 PM [facility]

## 2022-06-08 NOTE — Progress Notes (Signed)
Pt has DC order. Pt is going home with injection subq Octreotide, pt has no knowledge with self-injection. Self-injection administration was taught to pt via demonstration. All questions were answered. AVS was given and explained to pt. Pending family to pick-up pt.

## 2022-06-08 NOTE — Telephone Encounter (Signed)
Pulmonology follow up requested in 2-4 weeks.   Julian Hy, DO 06/08/22 1:56 PM [facility]

## 2022-06-08 NOTE — Discharge Instructions (Addendum)
Go to all follow-up appointments as instructed including follow-ups with pulmonology, oncology and your primary care provider. Take all prescribed medications as instructed including giving herself subcutaneous Sandostatin injections 3 times daily until you follow-up with pulmonology. Weigh yourself daily.  If you begin to experience greater than 5 pound weight gain in 1 week or greater than a 2 pound weight gain in 1 day please notify your outpatient provider. Return to the emergency department if you develop any shortness of breath, chest pain, weakness, fever or rapid weight gain.

## 2022-06-09 ENCOUNTER — Telehealth: Payer: Self-pay | Admitting: Hematology and Oncology

## 2022-06-09 ENCOUNTER — Encounter: Payer: Self-pay | Admitting: *Deleted

## 2022-06-09 ENCOUNTER — Telehealth: Payer: Self-pay | Admitting: Internal Medicine

## 2022-06-09 ENCOUNTER — Other Ambulatory Visit: Payer: Self-pay | Admitting: *Deleted

## 2022-06-09 DIAGNOSIS — D539 Nutritional anemia, unspecified: Secondary | ICD-10-CM

## 2022-06-09 DIAGNOSIS — D72823 Leukemoid reaction: Secondary | ICD-10-CM

## 2022-06-09 LAB — UPEP/UIFE/LIGHT CHAINS/TP, 24-HR UR
Free Kappa Lt Chains,Ur: 9.06 mg/L (ref 1.17–86.46)
Free Kappa/Lambda Ratio: 8.09 (ref 1.83–14.26)
Free Lambda Lt Chains,Ur: 1.12 mg/L (ref 0.27–15.21)
Total Protein, Urine-Ur/day: 183 mg/24 hr — ABNORMAL HIGH (ref 30–150)
Total Protein, Urine: 7.4 mg/dL
Total Volume: 2475

## 2022-06-09 NOTE — Progress Notes (Signed)
Pt  with hx of extensive third spacing and normoalbuminemic.  MD requesting new pt hematology appt to establish care.  Message sent to scheduling to set up appt.

## 2022-06-09 NOTE — Telephone Encounter (Signed)
HOSPITAL MEDICINE TELEPHONE ENCOUNTER NOTE  Notified by nursing that patient has been unable to fill her octreotide as her insurance company would not cover the cost.  I called the patient's pharmacy and discussed this with them.  They stated that the insurance company would need to be contacted directly to discuss options to get this filled.  I then discussed the case with [facility] prior authorization department.  They noted that [facility] has a specific office for their own prior authorization claims that is not handled by the [employer] office.  They provided me the phone number to call [phone] and stated that the patient could call this phone number and request that a prior authorization be forwarded to my office for completion.  Unfortunately at the time of calling this phone number the office is closed.  I updated the patient on these developments and she plans to call the above listed phone number on 11/17 during office hours to request a prior authorization be forwarded to [place] so that we can complete it and the patient can get her octreotide if possible.  Vernelle Emerald MD [facility]

## 2022-06-09 NOTE — Telephone Encounter (Signed)
Patient is scheduled on 12/13 at 1:30pm with Dr. Verlee Monte. Reminder and consult forms mailed to patient. Nothing further needed.

## 2022-06-09 NOTE — Telephone Encounter (Signed)
Left patient a voicemail regarding new patient appointment 11/22

## 2022-06-13 LAB — MPN W/HYPEREOSINOPHILIA FISH

## 2022-06-13 IMAGING — US US PELVIS COMPLETE WITH TRANSVAGINAL
1 series · 14 of 25 positions shown · non-contrast
Comparison: None Available.

CLINICAL DATA: Post menopausal bleeding



[Series 1: us pelvis complete with transvaginal · 0.23mm/px · 14 of 28 slices shown]
[im 1/28]
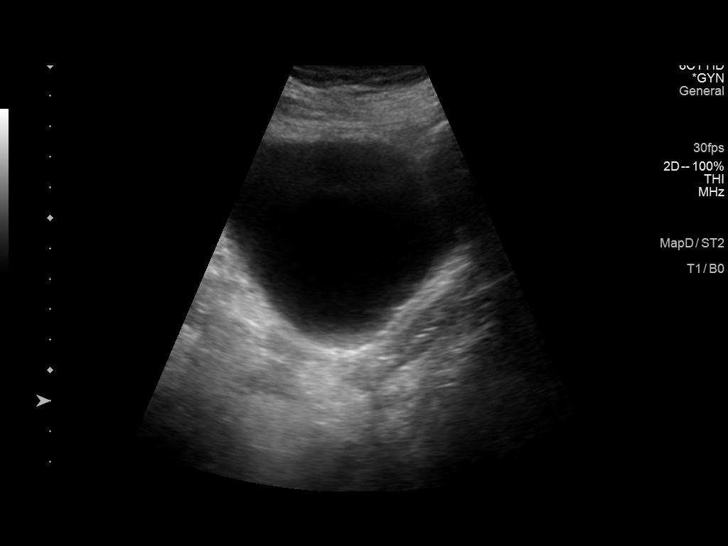
[im 3/28]
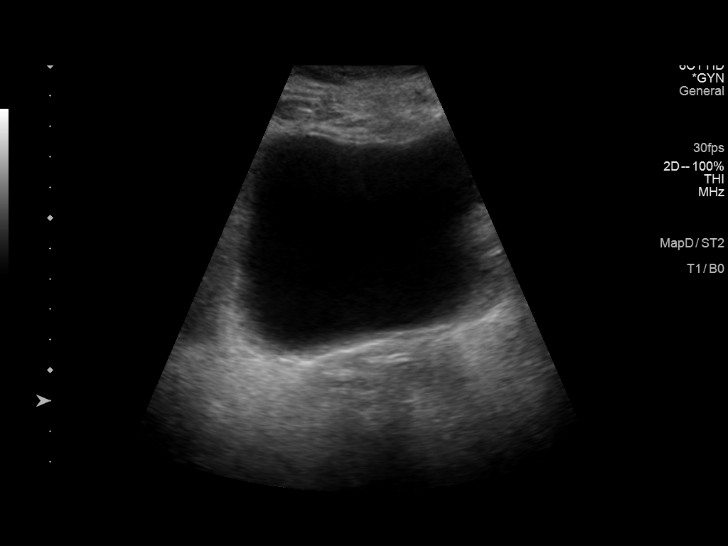
[im 5/28]
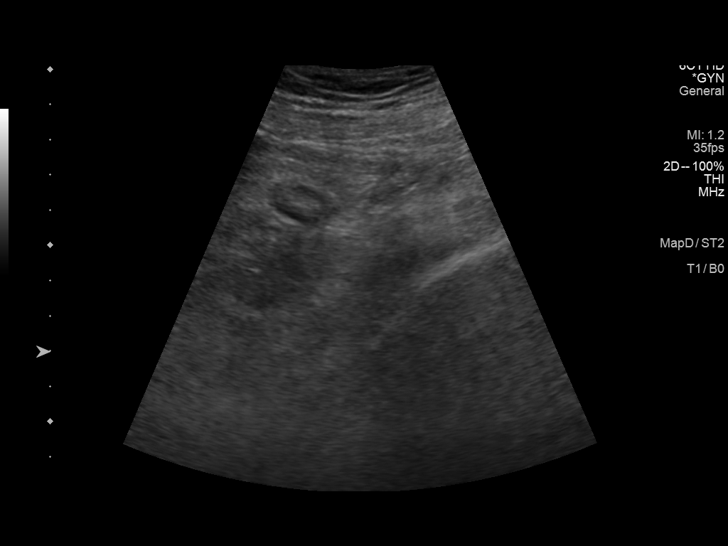
[im 7/28]
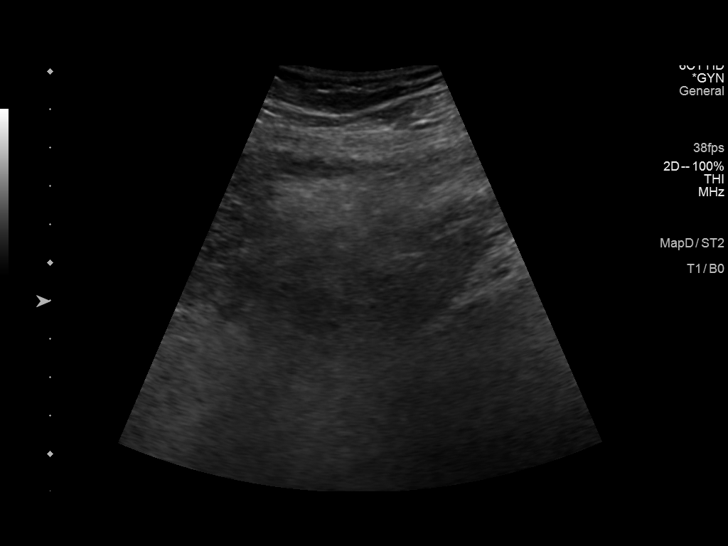
[im 10/28]
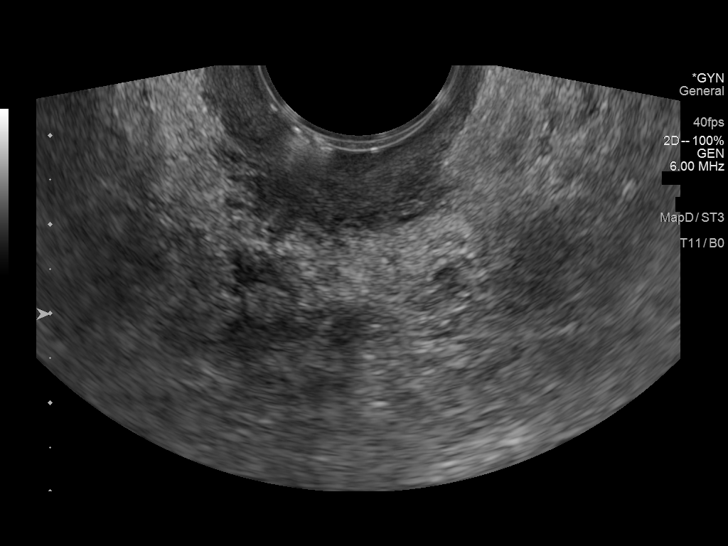
[im 11/28]
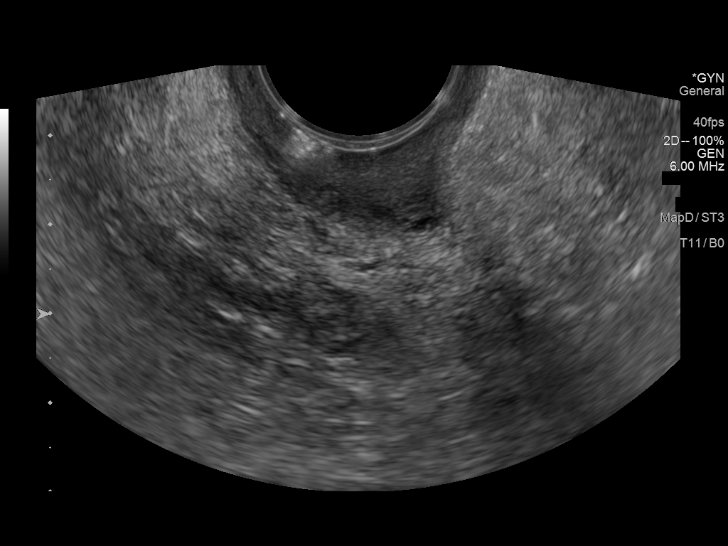
[im 13/28]
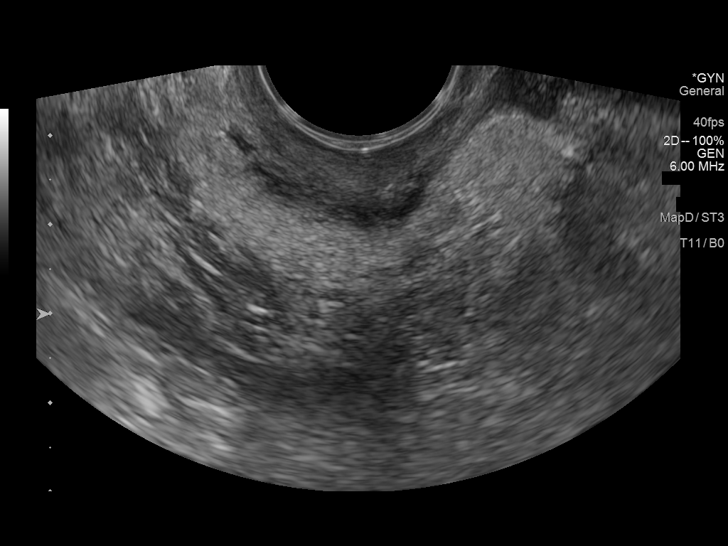
[im 15/28]
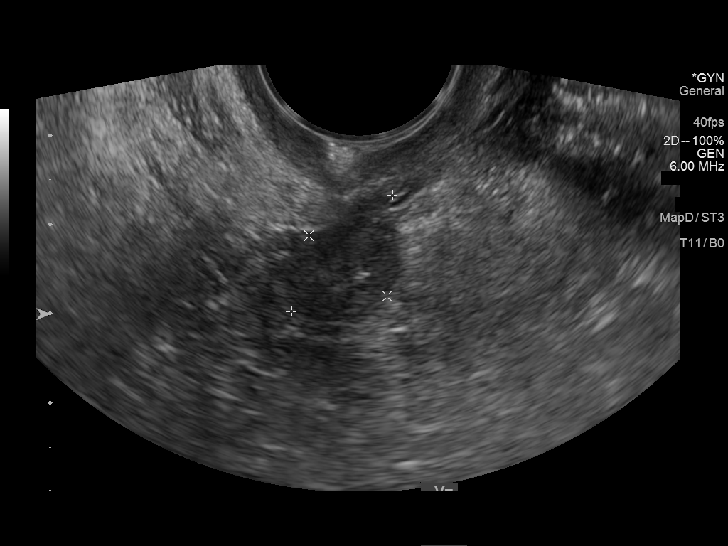
[im 17/28]
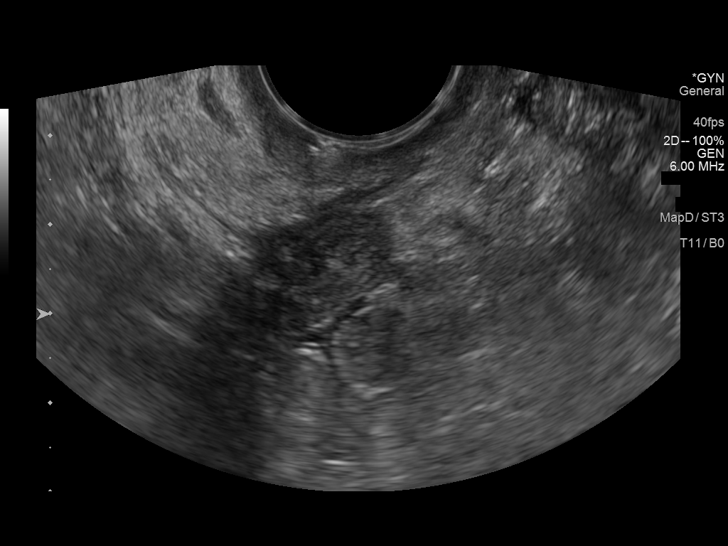
[im 19/28]
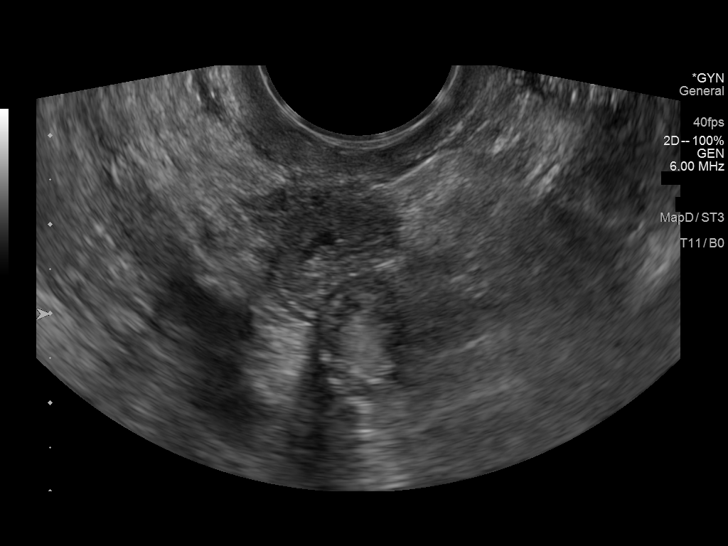
[im 21/28]
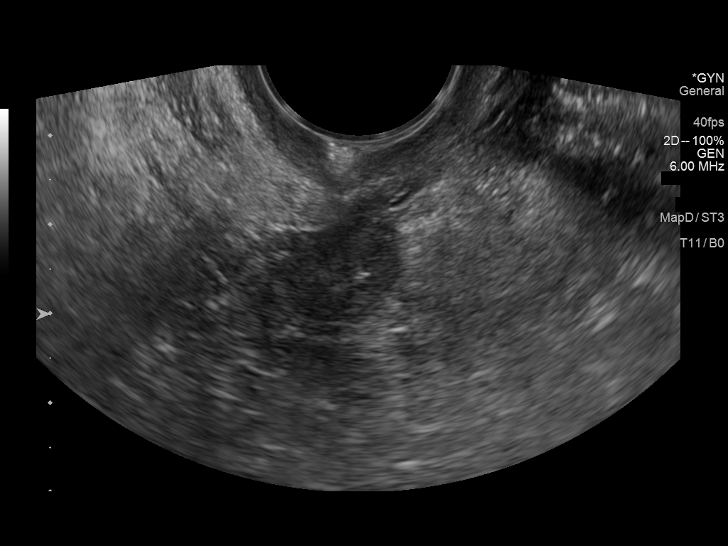
[im 23/28]
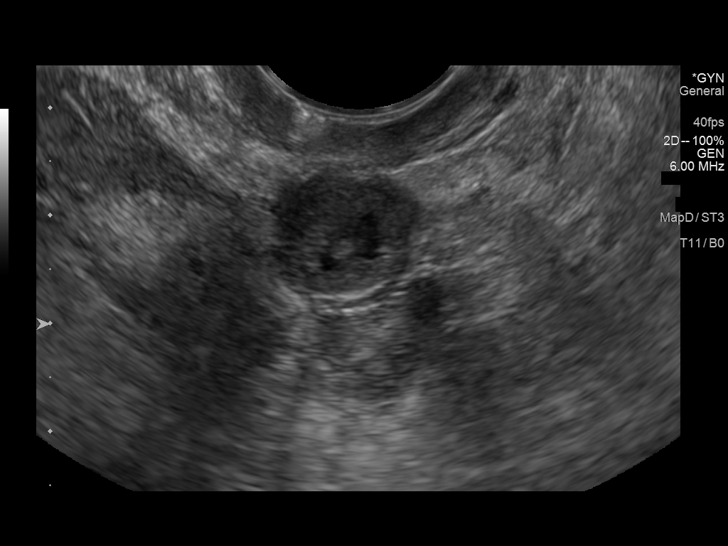
[im 25/28]
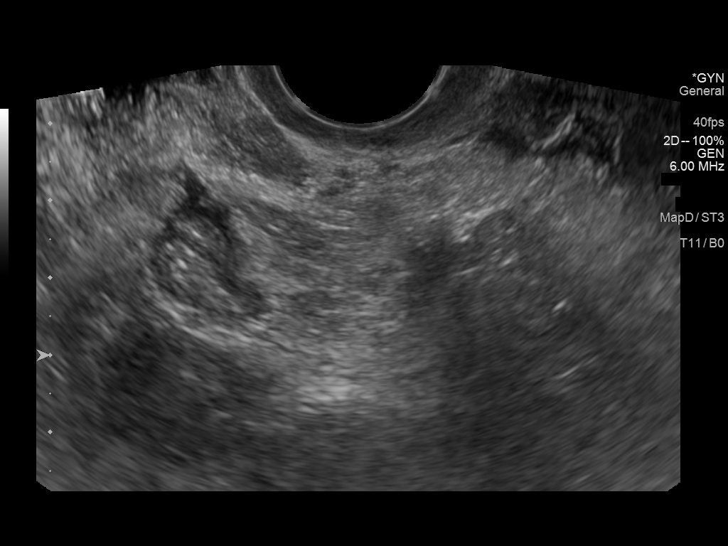
[im 28/28]
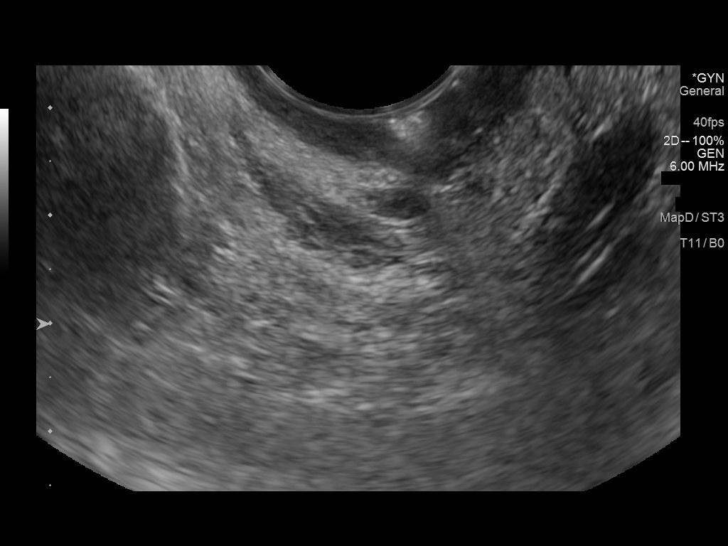

[14 of 25 positions shown; findings below may reference images not displayed]

FINDINGS: Uterus

Status post hysterectomy.

Endometrium

Status post hysterectomy

Right ovary

Measurements: 1.7 x 1.1 x 1.3 cm = volume: 1.3 mL. Normal
appearance/no adnexal mass.

Left ovary

Not seen

Other findings

No abnormal free fluid.
IMPRESSION: Status post hysterectomy.  Nonvisualized left ovary

## 2022-06-15 ENCOUNTER — Inpatient Hospital Stay: Payer: BC Managed Care – PPO | Attending: Hematology and Oncology | Admitting: Hematology and Oncology

## 2022-06-15 ENCOUNTER — Inpatient Hospital Stay: Payer: BC Managed Care – PPO

## 2022-06-15 VITALS — BP 130/82 | HR 82 | Temp 97.2°F | Resp 18 | Ht 61.0 in | Wt 134.5 lb

## 2022-06-15 DIAGNOSIS — D539 Nutritional anemia, unspecified: Secondary | ICD-10-CM | POA: Diagnosis not present

## 2022-06-15 DIAGNOSIS — Z87891 Personal history of nicotine dependence: Secondary | ICD-10-CM | POA: Diagnosis not present

## 2022-06-15 DIAGNOSIS — R846 Abnormal cytological findings in specimens from respiratory organs and thorax: Secondary | ICD-10-CM | POA: Insufficient documentation

## 2022-06-15 DIAGNOSIS — J940 Chylous effusion: Secondary | ICD-10-CM | POA: Diagnosis not present

## 2022-06-15 LAB — CMP (CANCER CENTER ONLY)
ALT: 167 U/L — ABNORMAL HIGH (ref 0–44)
AST: 41 U/L (ref 15–41)
Albumin: 3.9 g/dL (ref 3.5–5.0)
Alkaline Phosphatase: 94 U/L (ref 38–126)
Anion gap: 6 (ref 5–15)
BUN: 39 mg/dL — ABNORMAL HIGH (ref 8–23)
CO2: 36 mmol/L — ABNORMAL HIGH (ref 22–32)
Calcium: 9.8 mg/dL (ref 8.9–10.3)
Chloride: 92 mmol/L — ABNORMAL LOW (ref 98–111)
Creatinine: 1.15 mg/dL — ABNORMAL HIGH (ref 0.44–1.00)
GFR, Estimated: 54 mL/min — ABNORMAL LOW (ref >60)
Glucose, Bld: 138 mg/dL — ABNORMAL HIGH (ref 70–99)
Potassium: 4.2 mmol/L (ref 3.5–5.1)
Sodium: 134 mmol/L — ABNORMAL LOW (ref 135–145)
Total Bilirubin: 0.2 mg/dL — ABNORMAL LOW (ref 0.3–1.2)
Total Protein: 7 g/dL (ref 6.5–8.1)

## 2022-06-15 LAB — CBC WITH DIFFERENTIAL (CANCER CENTER ONLY)
Abs Immature Granulocytes: 0.04 10*3/uL (ref 0.00–0.07)
Basophils Absolute: 0.1 10*3/uL (ref 0.0–0.1)
Basophils Relative: 1 %
Eosinophils Absolute: 0.2 10*3/uL (ref 0.0–0.5)
Eosinophils Relative: 3 %
HCT: 39.5 % (ref 36.0–46.0)
Hemoglobin: 13.6 g/dL (ref 12.0–15.0)
Immature Granulocytes: 1 %
Lymphocytes Relative: 20 %
Lymphs Abs: 1.4 10*3/uL (ref 0.7–4.0)
MCH: 33.2 pg (ref 26.0–34.0)
MCHC: 34.4 g/dL (ref 30.0–36.0)
MCV: 96.3 fL (ref 80.0–100.0)
Monocytes Absolute: 0.6 10*3/uL (ref 0.1–1.0)
Monocytes Relative: 9 %
Neutro Abs: 4.6 10*3/uL (ref 1.7–7.7)
Neutrophils Relative %: 66 %
Platelet Count: 592 10*3/uL — ABNORMAL HIGH (ref 150–400)
RBC: 4.1 MIL/uL (ref 3.87–5.11)
RDW: 13 % (ref 11.5–15.5)
WBC Count: 6.8 10*3/uL (ref 4.0–10.5)
nRBC: 0 % (ref 0.0–0.2)

## 2022-06-15 NOTE — Progress Notes (Signed)
[facility] CONSULT NOTE  Patient Care Team: Doretha Sou, MD as PCP - General (Internal Medicine) Harl Bowie Royetta Crochet, MD as PCP - Cardiology (Cardiology)  CHIEF COMPLAINTS/PURPOSE OF CONSULTATION:  Chylothorax concern for lymphoma  HISTORY OF PRESENTING ILLNESS:  Alyssa Rhodes 61 y.o. female is here because of recent hospital mission for 50 pound anasarca that has been going on for the past 6 months.  She was noted to have bilateral pleural effusions and a thoracentesis revealed bilateral chylothorax.  With extensive treatment in the hospital she was able to lose some of the water weight.  She still continues to have bilateral lower extremity swelling which is now more firm on the skin.  She also has swelling of the left arm.  Her right arm appears to be normal.  While she was in the hospital CT chest abdomen pelvis was performed  I reviewed her records extensively and collaborated the history with the patient.     MEDICAL HISTORY:  Past Medical History:  Diagnosis Date   Diabetes mellitus without complication ([place])    Hyperlipidemia    Hypertension    Lupus ([place])    Thyroid nodule 03/2010   aspiration    SURGICAL HISTORY: Past Surgical History:  Procedure Laterality Date   ABDOMINAL HYSTERECTOMY     CESAREAN SECTION     ESOPHAGOGASTRODUODENOSCOPY (EGD) WITH PROPOFOL N/A 04/08/2022   Procedure: ESOPHAGOGASTRODUODENOSCOPY (EGD) WITH PROPOFOL;  Surgeon: Clarene Essex, MD;  Location: [facility] ENDOSCOPY;  Service: Gastroenterology;  Laterality: N/A;   SHOULDER SURGERY  2011   LEFT    SOCIAL HISTORY: Social History   Socioeconomic History   Marital status: Widowed    Spouse name: Not on file   Number of children: 2   Years of education: Not on file   Highest education level: Not on file  Occupational History   Occupation: accountant  Tobacco Use   Smoking status: Former    Packs/day: 1.00    Years: 41.00    Total pack years: 41.00    Types: Cigarettes    Smokeless tobacco: Never  Vaping Use   Vaping Use: Never used  Substance and Sexual Activity   Alcohol use: Not Currently    Alcohol/week: 3.0 standard drinks of alcohol    Types: 3 Standard drinks or equivalent per week    Comment: 3/week   Drug use: No   Sexual activity: Not Currently    Partners: Male  Other Topics Concern   Not on file  Social History Narrative   Not on file   Social Determinants of Health   Financial Resource Strain: Low Risk  (11/19/2018)   Overall Financial Resource Strain (CARDIA)    Difficulty of Paying Living Expenses: Not hard at all  Food Insecurity: No Food Insecurity (05/24/2022)   Hunger Vital Sign    Worried About Running Out of Food in the Last Year: Never true    Ran Out of Food in the Last Year: Never true  Transportation Needs: No Transportation Needs (05/24/2022)   PRAPARE - Hydrologist (Medical): No    Lack of Transportation (Non-Medical): No  Physical Activity: Inactive (11/19/2018)   Exercise Vital Sign    Days of Exercise per Week: 0 days    Minutes of Exercise per Session: 0 min  Stress: No Stress Concern Present (11/19/2018)   [place]    Feeling of Stress : Only a little  Social Connections:  Moderately Isolated (11/19/2018)   Social Connection and Isolation Panel [NHANES]    Frequency of Communication with Friends and Family: Never    Frequency of Social Gatherings with Friends and Family: Never    Attends Religious Services: More than 4 times per year    Active Member of [employer] or Organizations: No    Attends Archivist Meetings: Never    Marital Status: Widowed  Intimate Partner Violence: Not At Risk (05/24/2022)   Humiliation, Afraid, Rape, and Kick questionnaire    Fear of Current or Ex-Partner: No    Emotionally Abused: No    Physically Abused: No    Sexually Abused: No    FAMILY HISTORY: Family History  Problem  Relation Age of Onset   Asthma Mother        doe not know mother well, sister advised her   Diabetes Mother    Heart disease Father        heart attack   Hypertension Father     ALLERGIES:  is allergic to codeine and hydroxychloroquine.  MEDICATIONS:  Current Outpatient Medications  Medication Sig Dispense Refill   Ascorbic Acid (VITAMIN C PO) Take 1 tablet by mouth daily.     estradiol (ESTRACE) 0.1 MG/GM vaginal cream Place 1 Applicatorful vaginally 3 (three) times a week.     gabapentin (NEURONTIN) 300 MG capsule Take 1 capsule (300 mg total) by mouth daily. 30 capsule 1   Multiple Vitamin (MULTIVITAMIN) tablet Take 1 tablet by mouth daily with breakfast.     NEEDLE, DISP, 25 G (BD SAFETYGLIDE NEEDLE) 25G X 1" MISC 30 Needles by Does not apply route as directed. 100 each 0   octreotide (SANDOSTATIN) 50 MCG/ML SOLN injection Inject 1 mL (50 mcg total) into the skin every 8 (eight) hours. 90 mL 0   Omega-3 Fatty Acids (FISH OIL PO) Take 1 capsule by mouth in the morning and at bedtime.     omeprazole (PRILOSEC) 40 MG capsule Take 40 mg by mouth daily before breakfast.     ondansetron (ZOFRAN) 4 MG tablet Take 4 mg by mouth 3 (three) times daily as needed for nausea or vomiting.     oxyCODONE (OXY IR/ROXICODONE) 5 MG immediate release tablet Take 1 tablet (5 mg total) by mouth every 4 (four) hours as needed for moderate pain. 20 tablet 0   potassium chloride (KLOR-CON) 20 MEQ packet Take 20 mEq by mouth daily.     Syringe, Disposable, 1 ML MISC 30 Syringes by Does not apply route as directed. 100 each 0   torsemide (DEMADEX) 20 MG tablet Take 2 tablets (40 mg total) by mouth 2 (two) times daily. 60 tablet 1   No current facility-administered medications for this visit.    REVIEW OF SYSTEMS:   Constitutional: Denies fevers, chills or abnormal night sweats   All other systems were reviewed with the patient and are negative.  PHYSICAL EXAMINATION: ECOG PERFORMANCE STATUS: 1 -  Symptomatic but completely ambulatory  Vitals:   06/15/22 1526  BP: 130/82  Pulse: 82  Resp: 18  Temp: (!) 97.2 F (36.2 C)  SpO2: 95%   Filed Weights   06/15/22 1526  Weight: 134 lb 8 oz (61 kg)    GENERAL:alert, no distress and comfortable    LABORATORY DATA:  I have reviewed the data as listed Lab Results  Component Value Date   WBC 6.8 06/15/2022   HGB 13.6 06/15/2022   HCT 39.5 06/15/2022   MCV 96.3 06/15/2022  PLT 592 (H) 06/15/2022   Lab Results  Component Value Date   NA 134 (L) 06/15/2022   K 4.2 06/15/2022   CL 92 (L) 06/15/2022   CO2 36 (H) 06/15/2022    RADIOGRAPHIC STUDIES: I have personally reviewed the radiological reports and agreed with the findings in the report.  ASSESSMENT AND PLAN:  Bilateral chylothorax Hospitalization 05/23/2022-06/08/2022: Anasarca (50 pound weight gain): Bilateral chylous pleural effusions (right thoracentesis 05/24/2022 and 06/01/2022 by IR: 1.3 L and 850 mL of chylous fluid: Cytology: Reactive mesothelial cells, lactate dehydrogenase: 77, triglycerides: 992, total protein: 4 g, glucose 191, cultures: Negative UPEP, IFE, KL ratio: Normal  CT CAP 03/17/2022: Bilateral pleural effusions no lymphadenopathy Labs: Hypereosinophilia FISH panel: Negative  Differential diagnosis for chylothorax: Nontraumatic causes: Malignancies especially lymphoma, CLL, myeloma versus other thoracic malignancies like lung esophageal, sarcoma, leukemia), nonmalignant causes like lymphangioleiomyomatosis (no cysts in the lungs), ileitis, pleuritis, lupus, TB, sarcoidosis, amyloidosis, heart failure, Down syndrome, Noonan syndrome etc.) Traumatic causes related to surgeries  Treatment plan: Perform bone marrow biopsy to rule out lymphoma or leukemia or myeloma. No other evidence of malignancy noted on the CT scans   If the bone marrow biopsy is negative then patient can be watched and monitored.   All questions were answered. The patient knows  to call the clinic with any problems, questions or concerns.    Harriette Ohara, MD 06/15/22

## 2022-06-15 NOTE — Assessment & Plan Note (Addendum)
Hospitalization 05/23/2022-06/08/2022: Anasarca (50 pound weight gain): Bilateral chylous pleural effusions (right thoracentesis 05/24/2022 and 06/01/2022 by IR: 1.3 L and 850 mL of chylous fluid: Cytology: Reactive mesothelial cells, lactate dehydrogenase: 77, triglycerides: 992, total protein: 4 g, glucose 191, cultures: Negative UPEP, IFE, KL ratio: Normal  CT CAP 03/17/2022: Bilateral pleural effusions no lymphadenopathy Labs: Hypereosinophilia FISH panel: Negative  Differential diagnosis for chylothorax: Nontraumatic causes: Malignancies especially lymphoma, CLL, myeloma versus other thoracic malignancies like lung esophageal, sarcoma, leukemia), nonmalignant causes like lymphangioleiomyomatosis (no cysts in the lungs), ileitis, pleuritis, lupus, TB, sarcoidosis, amyloidosis, heart failure, Down syndrome, Noonan syndrome etc.) Traumatic causes related to surgeries  Treatment plan: Perform bone marrow biopsy to rule out lymphoma or leukemia or myeloma. No other evidence of malignancy noted on the CT scans   If the bone marrow biopsy is negative then patient can be watched and monitored.

## 2022-06-18 DIAGNOSIS — Z09 Encounter for follow-up examination after completed treatment for conditions other than malignant neoplasm: Secondary | ICD-10-CM | POA: Diagnosis not present

## 2022-06-18 DIAGNOSIS — N644 Mastodynia: Secondary | ICD-10-CM | POA: Diagnosis not present

## 2022-06-18 DIAGNOSIS — I10 Essential (primary) hypertension: Secondary | ICD-10-CM | POA: Diagnosis not present

## 2022-06-18 DIAGNOSIS — Z6825 Body mass index (BMI) 25.0-25.9, adult: Secondary | ICD-10-CM | POA: Diagnosis not present

## 2022-06-18 DIAGNOSIS — I3139 Other pericardial effusion (noninflammatory): Secondary | ICD-10-CM | POA: Diagnosis not present

## 2022-06-18 DIAGNOSIS — E119 Type 2 diabetes mellitus without complications: Secondary | ICD-10-CM | POA: Diagnosis not present

## 2022-06-18 DIAGNOSIS — Z6828 Body mass index (BMI) 28.0-28.9, adult: Secondary | ICD-10-CM | POA: Diagnosis not present

## 2022-06-18 DIAGNOSIS — E7800 Pure hypercholesterolemia, unspecified: Secondary | ICD-10-CM | POA: Diagnosis not present

## 2022-06-18 DIAGNOSIS — Z79899 Other long term (current) drug therapy: Secondary | ICD-10-CM | POA: Diagnosis not present

## 2022-06-21 ENCOUNTER — Inpatient Hospital Stay (HOSPITAL_BASED_OUTPATIENT_CLINIC_OR_DEPARTMENT_OTHER): Payer: BC Managed Care – PPO | Admitting: Hematology and Oncology

## 2022-06-21 ENCOUNTER — Inpatient Hospital Stay: Payer: BC Managed Care – PPO

## 2022-06-21 VITALS — BP 111/65 | HR 88 | Temp 98.1°F | Resp 16

## 2022-06-21 DIAGNOSIS — J940 Chylous effusion: Secondary | ICD-10-CM

## 2022-06-21 DIAGNOSIS — D539 Nutritional anemia, unspecified: Secondary | ICD-10-CM

## 2022-06-21 DIAGNOSIS — D75839 Thrombocytosis, unspecified: Secondary | ICD-10-CM | POA: Diagnosis not present

## 2022-06-21 DIAGNOSIS — R590 Localized enlarged lymph nodes: Secondary | ICD-10-CM | POA: Diagnosis not present

## 2022-06-21 DIAGNOSIS — D7589 Other specified diseases of blood and blood-forming organs: Secondary | ICD-10-CM | POA: Diagnosis not present

## 2022-06-21 LAB — CBC WITH DIFFERENTIAL (CANCER CENTER ONLY)
Abs Immature Granulocytes: 0.02 10*3/uL (ref 0.00–0.07)
Basophils Absolute: 0 10*3/uL (ref 0.0–0.1)
Basophils Relative: 0 %
Eosinophils Absolute: 0.1 10*3/uL (ref 0.0–0.5)
Eosinophils Relative: 2 %
HCT: 36.5 % (ref 36.0–46.0)
Hemoglobin: 12.7 g/dL (ref 12.0–15.0)
Immature Granulocytes: 0 %
Lymphocytes Relative: 11 %
Lymphs Abs: 0.8 10*3/uL (ref 0.7–4.0)
MCH: 32.6 pg (ref 26.0–34.0)
MCHC: 34.8 g/dL (ref 30.0–36.0)
MCV: 93.6 fL (ref 80.0–100.0)
Monocytes Absolute: 0.5 10*3/uL (ref 0.1–1.0)
Monocytes Relative: 6 %
Neutro Abs: 6.1 10*3/uL (ref 1.7–7.7)
Neutrophils Relative %: 81 %
Platelet Count: 450 10*3/uL — ABNORMAL HIGH (ref 150–400)
RBC: 3.9 MIL/uL (ref 3.87–5.11)
RDW: 13.1 % (ref 11.5–15.5)
WBC Count: 7.5 10*3/uL (ref 4.0–10.5)
nRBC: 0 % (ref 0.0–0.2)

## 2022-06-21 LAB — MISC LABCORP TEST (SEND OUT): Labcorp test code: 9985

## 2022-06-21 MED ORDER — LIDOCAINE HCL 1 % IJ SOLN: 20.0000 mL | Freq: Once | INTRAMUSCULAR | Status: DC

## 2022-06-21 MED ORDER — LIDOCAINE HCL 2 % IJ SOLN
INTRAMUSCULAR | Status: AC
  Filled 2022-06-21: qty 20

## 2022-06-21 MED ORDER — LIDOCAINE HCL (PF) 1 % IJ SOLN
20.0000 mL | Freq: Once | INTRAMUSCULAR | Status: DC
  Filled 2022-06-21: qty 20

## 2022-06-21 NOTE — Patient Instructions (Signed)
Bone Marrow Aspiration and Bone Marrow Biopsy, Adult, Care After The following information offers guidance on how to care for yourself after your procedure. Your health care provider may also give you more specific instructions. If you have problems or questions, contact your health care provider. What can I expect after the procedure? After the procedure, it is common to have: Mild pain and tenderness. Swelling. Bruising. Follow these instructions at home: Incision care  Follow instructions from your health care provider about how to take care of the incision site. Make sure you: Wash your hands with soap and water for at least 20 seconds before and after you change your bandage (dressing). If soap and water are not available, use hand sanitizer. Change your dressing as told by your health care provider. Leave stitches (sutures), skin glue, or adhesive strips in place. These skin closures may need to stay in place for 2 weeks or longer. If adhesive strip edges start to loosen and curl up, you may trim the loose edges. Do not remove adhesive strips completely unless your health care provider tells you to do that. Check your incision site every day for signs of infection. Check for: More redness, swelling, or pain. Fluid or blood. Warmth. Pus or a bad smell. Activity Return to your normal activities as told by your health care provider. Ask your health care provider what activities are safe for you. Do not lift anything that is heavier than 10 lb (4.5 kg), or the limit that you are told, until your health care provider says that it is safe. If you were given a sedative during the procedure, it can affect you for several hours. Do not drive or operate machinery until your health care provider says that it is safe. General instructions  Take over-the-counter and prescription medicines only as told by your health care provider. Do not take baths, swim, or use a hot tub until your health care  provider approves. Ask your health care provider if you may take showers. You may only be allowed to take sponge baths. If directed, put ice on the affected area. To do this: Put ice in a plastic bag. Place a towel between your skin and the bag. Leave the ice on for 20 minutes, 2-3 times a day. If your skin turns bright red, remove the ice right away to prevent skin damage. The risk of skin damage is higher if you cannot feel pain, heat, or cold. Contact a health care provider if: You have signs of infection. Your pain is not controlled with medicine. You have cancer, and a temperature of 100.80F (38C) or higher. Get help right away if: You have a temperature of 101F (38.3C) or higher, or as told by your health care provider. You have bleeding from the incision site that cannot be controlled. This information is not intended to replace advice given to you by your health care provider. Make sure you discuss any questions you have with your health care provider. Document Revised: 11/15/2021 Document Reviewed: 11/15/2021 Elsevier Patient Education  [employer].

## 2022-06-21 NOTE — Progress Notes (Signed)
Pt observed for 30 minutes post Bone Marrow Biopsy and Aspiration. Pt tolerated procedure well w/out incident. VSS at discharge.  Ambulatory to lobby.    Upon assessment of dressing at biopsy site dressing was found to be soiled. This RN removed dressing, cleaned site, and applied gauze with pressure tape as new dressing. Pt was educated to assess dressing for signs of bleeding today and to remove dressing tomorrow after 24 hours. This RN made Dr. Lindi Adie and Anda Kraft PA-C aware.

## 2022-06-21 NOTE — Progress Notes (Signed)
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 ENT: adequate airway clearance Heart: regular rate Lungs: clear to auscultation, no wheezes, normal respiratory effort  Bone Marrow Biopsy and Aspiration Procedure Note   Informed consent was obtained and potential risks including bleeding, infection and pain were reviewed with the patient.  The patient's name, date of birth, identification, consent and allergies were verified prior to the start of procedure and time out was performed.  The left posterior iliac crest was chosen as the site of biopsy.  The skin was prepped with ChloraPrep.   10 cc of 1% lidocaine was used to provide local anaesthesia.   10 cc of bone marrow aspirate was obtained followed by 1cm biopsy.  Pressure was applied to the biopsy site and bandage was placed over the biopsy site. Patient was made to lie on the back for 15 mins prior to discharge.  The procedure was tolerated well. COMPLICATIONS: None BLOOD LOSS: none The patient was discharged home in stable condition with a 1 week follow up to review results.  Patient was provided with post bone marrow biopsy instructions and instructed to call if there was any bleeding or worsening pain.  Specimens sent for flow cytometry, cytogenetics and additional studies. Procedure was proctored by Dr. Lindi Adie.  Signed Barrie Folk, PA-C

## 2022-06-24 LAB — SURGICAL PATHOLOGY

## 2022-06-25 NOTE — Progress Notes (Signed)
Patient Care Team: Doretha Sou, MD as PCP - General (Internal Medicine) Janina Mayo, MD as PCP - Cardiology (Cardiology)  DIAGNOSIS: No diagnosis found.  SUMMARY OF ONCOLOGIC HISTORY: Oncology History   No history exists.    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 She presents to the clinic for a follow-up.    ALLERGIES:  is allergic to codeine and hydroxychloroquine.  MEDICATIONS:  Current Outpatient Medications  Medication Sig Dispense Refill   Ascorbic Acid (VITAMIN C PO) Take 1 tablet by mouth daily.     estradiol (ESTRACE) 0.1 MG/GM vaginal cream Place 1 Applicatorful vaginally 3 (three) times a week.     gabapentin (NEURONTIN) 300 MG capsule Take 1 capsule (300 mg total) by mouth daily. 30 capsule 1   Multiple Vitamin (MULTIVITAMIN) tablet Take 1 tablet by mouth daily with breakfast.     NEEDLE, DISP, 25 G (BD SAFETYGLIDE NEEDLE) 25G X 1" MISC 30 Needles by Does not apply route as directed. 100 each 0   octreotide (SANDOSTATIN) 50 MCG/ML SOLN injection Inject 1 mL (50 mcg total) into the skin every 8 (eight) hours. 90 mL 0   Omega-3 Fatty Acids (FISH OIL PO) Take 1 capsule by mouth in the morning and at bedtime.     omeprazole (PRILOSEC) 40 MG capsule Take 40 mg by mouth daily before breakfast.     ondansetron (ZOFRAN) 4 MG tablet Take 4 mg by mouth 3 (three) times daily as needed for nausea or vomiting.     oxyCODONE (OXY IR/ROXICODONE) 5 MG immediate release tablet Take 1 tablet (5 mg total) by mouth every 4 (four) hours as needed for moderate pain. 20 tablet 0   potassium chloride (KLOR-CON) 20 MEQ packet Take 20 mEq by mouth daily.     Syringe, Disposable, 1 ML MISC 30 Syringes by Does not apply route as directed. 100 each 0   torsemide (DEMADEX) 20 MG tablet Take 2 tablets (40 mg total) by mouth 2 (two) times daily. 60  tablet 1   No current facility-administered medications for this visit.   Facility-Administered Medications Ordered in Other Visits  Medication Dose Route Frequency Provider Last Rate Last Admin   lidocaine (PF) (XYLOCAINE) 1 % injection 20 mL  20 mL Other Once Nicholas Lose, MD        PHYSICAL EXAMINATION: ECOG PERFORMANCE STATUS: {CHL ONC ECOG HC:[id number]}  There were no vitals filed for this visit. There were no vitals filed for this visit.  BREAST:*** No palpable masses or nodules in either right or left breasts. No palpable axillary supraclavicular or infraclavicular adenopathy no breast tenderness or nipple discharge. (exam performed in the presence of a chaperone)  LABORATORY DATA:  I have reviewed the data as listed    Latest Ref Rng & Units 06/15/2022    3:01 PM 06/08/2022   12:55 PM 06/07/2022    5:54 AM  CMP  Glucose 70 - 99 mg/dL 138  172  141   BUN 8 - 23 mg/dL 39  47  49   Creatinine 0.44 - 1.00 mg/dL 1.15  1.13  1.25   Sodium 135 - 145 mmol/L 134  133  130   Potassium 3.5 - 5.1 mmol/L 4.2  3.3  3.4   Chloride 98 - 111 mmol/L 92  86  84   CO2 22 - 32 mmol/L 36  34  37   Calcium 8.9 - 10.3 mg/dL 9.8  9.6  9.3   Total Protein 6.5 - 8.1 g/dL 7.0   6.9   Total Bilirubin 0.3 - 1.2 mg/dL 0.2   0.4   Alkaline Phos 38 - 126 U/L 94   59   AST 15 - 41 U/L 41   20   ALT 0 - 44 U/L 167   23     Lab Results  Component Value Date   WBC 7.5 06/21/2022   HGB 12.7 06/21/2022   HCT 36.5 06/21/2022   MCV 93.6 06/21/2022   PLT 450 (H) 06/21/2022   NEUTROABS 6.1 06/21/2022    ASSESSMENT & PLAN:  No problem-specific Assessment & Plan notes found for this encounter.    No orders of the defined types were placed in this encounter.  The patient has a good understanding of the overall plan. she agrees with it. she will call with any problems that may develop before the next visit here. Total time spent: 30 mins including face to face time and time spent for planning,  charting and co-ordination of care   Suzzette Righter, [place] 06/25/22    I Gardiner Coins am scribing for Dr. Lindi Adie  ***

## 2022-06-28 ENCOUNTER — Inpatient Hospital Stay: Payer: BC Managed Care – PPO | Attending: Hematology and Oncology | Admitting: Hematology and Oncology

## 2022-06-28 ENCOUNTER — Encounter (HOSPITAL_COMMUNITY): Payer: Self-pay | Admitting: Hematology and Oncology

## 2022-06-28 VITALS — BP 148/83 | HR 67 | Temp 97.8°F | Resp 18 | Ht 61.0 in | Wt 148.6 lb

## 2022-06-28 DIAGNOSIS — J940 Chylous effusion: Secondary | ICD-10-CM | POA: Diagnosis not present

## 2022-06-28 NOTE — Assessment & Plan Note (Signed)
Hospitalization 05/23/2022-06/08/2022: Anasarca (50 pound weight gain): Bilateral chylous pleural effusions (right thoracentesis 05/24/2022 and 06/01/2022 by IR: 1.3 L and 850 mL of chylous fluid: Cytology: Reactive mesothelial cells, lactate dehydrogenase: 77, triglycerides: 992, total protein: 4 g, glucose 191, cultures: Negative UPEP, IFE, KL ratio: Normal   CT CAP 03/17/2022: Bilateral pleural effusions no lymphadenopathy Labs: Hypereosinophilia FISH panel: Negative   Differential diagnosis for chylothorax: Nontraumatic causes: Malignancies especially lymphoma, CLL, myeloma versus other thoracic malignancies like lung esophageal, sarcoma, leukemia), nonmalignant causes like lymphangioleiomyomatosis (no cysts in the lungs), ileitis, pleuritis, lupus, TB, sarcoidosis, amyloidosis, heart failure, Down syndrome, Noonan syndrome etc.) Traumatic causes related to surgeries ------------------------------------------------------------------------------------------------------------------------------ 06/21/2022: Bone marrow biopsy: Mildly hypercellular bone marrow 50% with orderly trilineage hematopoiesis.  Scattered lymphoid aggregates felt to be reactive.  Since there is no evidence of lymphoma in the bone marrow no additional testing is being planned at this time.  Scans and did not show any evidence of lymphoma.  Patient can be seen by [place] on an as-needed basis.

## 2022-06-30 ENCOUNTER — Ambulatory Visit: Payer: BC Managed Care – PPO | Admitting: Hematology and Oncology

## 2022-07-01 DIAGNOSIS — N179 Acute kidney failure, unspecified: Secondary | ICD-10-CM | POA: Diagnosis not present

## 2022-07-01 DIAGNOSIS — I129 Hypertensive chronic kidney disease with stage 1 through stage 4 chronic kidney disease, or unspecified chronic kidney disease: Secondary | ICD-10-CM | POA: Diagnosis not present

## 2022-07-01 DIAGNOSIS — J940 Chylous effusion: Secondary | ICD-10-CM | POA: Diagnosis not present

## 2022-07-01 DIAGNOSIS — R601 Generalized edema: Secondary | ICD-10-CM | POA: Diagnosis not present

## 2022-07-02 DIAGNOSIS — Z6827 Body mass index (BMI) 27.0-27.9, adult: Secondary | ICD-10-CM | POA: Diagnosis not present

## 2022-07-02 DIAGNOSIS — Z79899 Other long term (current) drug therapy: Secondary | ICD-10-CM | POA: Diagnosis not present

## 2022-07-02 DIAGNOSIS — E119 Type 2 diabetes mellitus without complications: Secondary | ICD-10-CM | POA: Diagnosis not present

## 2022-07-02 DIAGNOSIS — E7800 Pure hypercholesterolemia, unspecified: Secondary | ICD-10-CM | POA: Diagnosis not present

## 2022-07-02 DIAGNOSIS — N644 Mastodynia: Secondary | ICD-10-CM | POA: Diagnosis not present

## 2022-07-02 DIAGNOSIS — J90 Pleural effusion, not elsewhere classified: Secondary | ICD-10-CM | POA: Diagnosis not present

## 2022-07-02 DIAGNOSIS — Z0000 Encounter for general adult medical examination without abnormal findings: Secondary | ICD-10-CM | POA: Diagnosis not present

## 2022-07-04 NOTE — Progress Notes (Unsigned)
Synopsis: Referred for recurrent chylothorax by Doretha Sou*  Subjective:   PATIENT ID: Alyssa Rhodes: female DOB: 1960-08-24, MRN: [id number]  No chief complaint on file.  Alyssa Rhodes is a 61 y.o. 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  Past Medical History:  Diagnosis Date   Diabetes mellitus without complication ([place])    Hyperlipidemia    Hypertension    Lupus ([place])    Thyroid nodule 03/2010   aspiration     Family History  Problem Relation Age of Onset   Asthma Mother        doe not know mother well, sister advised her   Diabetes Mother    Heart disease Father        heart attack   Hypertension Father      Past Surgical History:  Procedure Laterality Date   ABDOMINAL HYSTERECTOMY     CESAREAN SECTION     ESOPHAGOGASTRODUODENOSCOPY (EGD) WITH PROPOFOL N/A 04/08/2022   Procedure: ESOPHAGOGASTRODUODENOSCOPY (EGD) WITH PROPOFOL;  Surgeon: Clarene Essex, MD;  Location: [facility];  Service: Gastroenterology;  Laterality: N/A;   SHOULDER SURGERY  2011   LEFT    Social History   Socioeconomic History   Marital status: Widowed    Spouse name: Not on file   Number of children: 2   Years of education: Not on file   Highest education level: Not on file  Occupational History   Occupation: accountant  Tobacco Use   Smoking status: Former    Packs/day: 1.00    Years: 41.00    Total pack years: 41.00    Types: Cigarettes   Smokeless tobacco: Never  Vaping Use   Vaping Use: Never used  Substance and Sexual Activity   Alcohol use: Not Currently    Alcohol/week: 3.0 standard drinks of alcohol    Types: 3 Standard drinks or equivalent per week    Comment:  3/week   Drug use: No   Sexual activity: Not Currently    Partners: Male  Other Topics Concern   Not on file  Social History Narrative   Not on file   Social Determinants of Health   Financial Resource Strain: Low Risk  (11/19/2018)   Overall Financial Resource Strain (CARDIA)    Difficulty of Paying Living Expenses: Not hard at all  Food Insecurity: No Food Insecurity (05/24/2022)   Hunger Vital Sign    Worried About Running Out of Food in the Last Year: Never true    Ran Out of Food in the Last Year: Never true  Transportation Needs: No Transportation Needs (05/24/2022)   PRAPARE - Hydrologist (Medical): No    Lack of Transportation (Non-Medical): No  Physical Activity: Inactive (11/19/2018)   Exercise Vital Sign    Days of Exercise per Week: 0 days    Minutes of Exercise per Session: 0 min  Stress: No Stress Concern Present (11/19/2018)   [place]    Feeling of Stress : Only a little  Social Connections: Moderately Isolated (11/19/2018)   Social Connection and Isolation Panel [NHANES]    Frequency of Communication with Friends and Family: Never  Frequency of Social Gatherings with Friends and Family: Never    Attends Religious Services: More than 4 times per year    Active Member of Clubs or Organizations: No    Attends Archivist Meetings: Never    Marital Status: Widowed  Intimate Partner Violence: Not At Risk (05/24/2022)   Humiliation, Afraid, Rape, and Kick questionnaire    Fear of Current or Ex-Partner: No    Emotionally Abused: No    Physically Abused: No    Sexually Abused: No     Allergies  Allergen Reactions   Codeine Nausea And Vomiting and Nausea Only   Hydroxychloroquine Rash     Outpatient Medications Prior to Visit  Medication Sig Dispense Refill   estradiol (ESTRACE) 0.1 MG/GM vaginal cream Place 1 Applicatorful vaginally 3 (three) times a  week.     gabapentin (NEURONTIN) 300 MG capsule Take 1 capsule (300 mg total) by mouth daily. 30 capsule 1   Multiple Vitamin (MULTIVITAMIN) tablet Take 1 tablet by mouth daily with breakfast.     NEEDLE, DISP, 25 G (BD SAFETYGLIDE NEEDLE) 25G X 1" MISC 30 Needles by Does not apply route as directed. 100 each 0   octreotide (SANDOSTATIN) 50 MCG/ML SOLN injection Inject 1 mL (50 mcg total) into the skin every 8 (eight) hours. 90 mL 0   Omega-3 Fatty Acids (FISH OIL PO) Take 1 capsule by mouth in the morning and at bedtime.     omeprazole (PRILOSEC) 40 MG capsule Take 40 mg by mouth daily before breakfast.     ondansetron (ZOFRAN) 4 MG tablet Take 4 mg by mouth 3 (three) times daily as needed for nausea or vomiting.     oxyCODONE (OXY IR/ROXICODONE) 5 MG immediate release tablet Take 1 tablet (5 mg total) by mouth every 4 (four) hours as needed for moderate pain. 20 tablet 0   potassium chloride (KLOR-CON) 20 MEQ packet Take 20 mEq by mouth daily.     Syringe, Disposable, 1 ML MISC 30 Syringes by Does not apply route as directed. 100 each 0   torsemide (DEMADEX) 20 MG tablet Take 2 tablets (40 mg total) by mouth 2 (two) times daily. 60 tablet 1   No facility-administered medications prior to visit.       Objective:   Physical Exam:  General appearance: 61 y.o., female, NAD, conversant  Eyes: anicteric sclerae; PERRL, tracking appropriately HENT: NCAT; MMM Neck: Trachea midline; no lymphadenopathy, no JVD Lungs: CTAB, no crackles, no wheeze, with normal respiratory effort CV: RRR, no murmur  Abdomen: Soft, non-tender; non-distended, BS present  Extremities: No peripheral edema, warm Skin: Normal turgor and texture; no rash Psych: Appropriate affect Neuro: Alert and oriented to person and place, no focal deficit     There were no vitals filed for this visit.   on *** LPM *** RA BMI Readings from Last 3 Encounters:  06/28/22 28.08 kg/m  06/15/22 25.41 kg/m  06/08/22 25.37 kg/m    Wt Readings from Last 3 Encounters:  06/28/22 148 lb 9.6 oz (67.4 kg)  06/15/22 134 lb 8 oz (61 kg)  06/08/22 134 lb 4.2 oz (60.9 kg)     CBC    Component Value Date/Time   WBC 7.5 06/21/2022 0842   WBC 5.5 06/08/2022 1255   RBC 3.90 06/21/2022 0842   HGB 12.7 06/21/2022 0842   HGB 9.8 (L) 09/23/2019 1002   HCT 36.5 06/21/2022 0842   HCT 30.9 (L) 09/23/2019 1002   PLT 450 (H) 06/21/2022  0842   PLT 571 (H) 09/23/2019 1002   MCV 93.6 06/21/2022 0842   MCV 80 09/23/2019 1002   MCH 32.6 06/21/2022 0842   MCHC 34.8 06/21/2022 0842   RDW 13.1 06/21/2022 0842   RDW 16.7 (H) 09/23/2019 1002   LYMPHSABS 0.8 06/21/2022 0842   LYMPHSABS 1.7 09/23/2019 1002   MONOABS 0.5 06/21/2022 0842   EOSABS 0.1 06/21/2022 0842   EOSABS 0.2 09/23/2019 1002   BASOSABS 0.0 06/21/2022 0842   BASOSABS 0.1 09/23/2019 1002    ***  Chest Imaging: CT Chest 06/02/22 with small R pleural effusion, increase in L effusion no adenopathy  Pulmonary Functions Testing Results:     No data to display           Echocardiogram 03/15/22:   1. Left ventricular ejection fraction, by estimation, is 60 to 65%. The  left ventricle has normal function. The left ventricle has no regional  wall motion abnormalities. Left ventricular diastolic parameters were  normal.   2. Right ventricular systolic function is normal. The right ventricular  size is normal. There is normal pulmonary artery systolic pressure. The  estimated right ventricular systolic pressure is 26.7 mmHg.   3. The mitral valve is normal in structure. Mild mitral valve  regurgitation. No evidence of mitral stenosis.   4. The aortic valve is normal in structure. Aortic valve regurgitation is  not visualized. No aortic stenosis is present.   5. The inferior vena cava is normal in size with greater than 50%  respiratory variability, suggesting right atrial pressure of 3 mmHg.   6. A small pericardial effusion is present. The pericardial  effusion is  anterior to the right ventricle and localized with largest diameter near  the right atrium with compression of the right atrium. Moderate pleural  effusion in the left lateral region.   There is < 25% respirophasic change in mitral valve inflow velocities.  Normal IVC diameter. Consdier chest CT for further assessment of Right  atrial compression. LIkely pericardial effusion but need to rule out other  etiologies.   Heart Catheterization: ***    Assessment & Plan:    Plan:      Alyssa Hurter, MD [facility] 07/04/2022 6:37 PM

## 2022-07-06 ENCOUNTER — Encounter: Payer: Self-pay | Admitting: Student

## 2022-07-06 ENCOUNTER — Ambulatory Visit (INDEPENDENT_AMBULATORY_CARE_PROVIDER_SITE_OTHER): Payer: BC Managed Care – PPO

## 2022-07-06 ENCOUNTER — Ambulatory Visit (INDEPENDENT_AMBULATORY_CARE_PROVIDER_SITE_OTHER): Payer: BC Managed Care – PPO | Admitting: Student

## 2022-07-06 VITALS — BP 136/82 | HR 84 | Temp 97.9°F | Ht 61.0 in | Wt 147.2 lb

## 2022-07-06 DIAGNOSIS — J940 Chylous effusion: Secondary | ICD-10-CM | POA: Diagnosis not present

## 2022-07-06 DIAGNOSIS — J90 Pleural effusion, not elsewhere classified: Secondary | ICD-10-CM | POA: Diagnosis not present

## 2022-07-06 DIAGNOSIS — J9811 Atelectasis: Secondary | ICD-10-CM | POA: Diagnosis not present

## 2022-07-06 NOTE — Patient Instructions (Signed)
-   CXR before you go today - see you on Friday for thoracentesis - we will work on setting you up to see lymphedema specialists in [facility]

## 2022-07-07 LAB — SURGICAL PATHOLOGY

## 2022-07-07 NOTE — Progress Notes (Signed)
Synopsis: Referred for recurrent chylothorax by Doretha Sou*  Subjective:   PATIENT ID: Alveria Apley GENDER: female DOB: 1960/07/28, MRN: [id number]  Chief Complaint  Patient presents with   Follow-up    Thoracentesis procedure today   KALIOPE QUINONEZ is a 61 y.o. female who has a PMH as below including but not limited to RA (was on MTX but recently taken off by rheumatology/Dr. Amil Amen on 10/24), DM, HTN, HLD seen in clinic for recurrent chylothorax of unclear etiology   Had BMBx since discharge from hospital which showed no evidence of malignancy  SHe overall feels tired, swollen. She has DOE with light activity. This has been pretty stable since discharge. She has no cough. No CP. She has orthopnea, sleeps in recliner.   She was taken off of mtx in advance of consult with [facility] for biliary dyskinesia. Lap chole offered after pre-op risk stratification.   No family history of lung disease  She is an Optometrist. She smoked for 44 years half ppd, quit 2023.   Interval HPI: Here for right thora today.  Hasn't had appointment scheduled yet with [facility].   No change in DOE with light activity. Still trouble with orthopnea.   Otherwise pertinent review of systems is negative.  Past Medical History:  Diagnosis Date   Diabetes mellitus without complication ([place])    Hyperlipidemia    Hypertension    Lupus ([place])    Thyroid nodule 03/2010   aspiration     Family History  Problem Relation Age of Onset   Asthma Mother        doe not know mother well, sister advised her   Diabetes Mother    Heart disease Father        heart attack   Hypertension Father      Past Surgical History:  Procedure Laterality Date   ABDOMINAL HYSTERECTOMY     CESAREAN SECTION     ESOPHAGOGASTRODUODENOSCOPY (EGD) WITH PROPOFOL N/A 04/08/2022   Procedure: ESOPHAGOGASTRODUODENOSCOPY (EGD) WITH PROPOFOL;  Surgeon: Clarene Essex, MD;  Location: [facility];  Service:  Gastroenterology;  Laterality: N/A;   SHOULDER SURGERY  2011   LEFT    Social History   Socioeconomic History   Marital status: Widowed    Spouse name: Not on file   Number of children: 2   Years of education: Not on file   Highest education level: Not on file  Occupational History   Occupation: accountant  Tobacco Use   Smoking status: Former    Packs/day: 1.00    Years: 41.00    Total pack years: 41.00    Types: Cigarettes    Quit date: 08/12/2021    Years since quitting: 0.9   Smokeless tobacco: Never  Vaping Use   Vaping Use: Never used  Substance and Sexual Activity   Alcohol use: Not Currently    Alcohol/week: 3.0 standard drinks of alcohol    Types: 3 Standard drinks or equivalent per week    Comment: 3/week   Drug use: No   Sexual activity: Not Currently    Partners: Male  Other Topics Concern   Not on file  Social History Narrative   Not on file   Social Determinants of Health   Financial Resource Strain: Low Risk  (11/19/2018)   Overall Financial Resource Strain (CARDIA)    Difficulty of Paying Living Expenses: Not hard at all  Food Insecurity: No Food Insecurity (05/24/2022)   Hunger Vital Sign    Worried  About Running Out of Food in the Last Year: Never true    Ran Out of Food in the Last Year: Never true  Transportation Needs: No Transportation Needs (05/24/2022)   PRAPARE - Hydrologist (Medical): No    Lack of Transportation (Non-Medical): No  Physical Activity: Inactive (11/19/2018)   Exercise Vital Sign    Days of Exercise per Week: 0 days    Minutes of Exercise per Session: 0 min  Stress: No Stress Concern Present (11/19/2018)   [place]    Feeling of Stress : Only a little  Social Connections: Moderately Isolated (11/19/2018)   Social Connection and Isolation Panel [NHANES]    Frequency of Communication with Friends and Family: Never    Frequency  of Social Gatherings with Friends and Family: Never    Attends Religious Services: More than 4 times per year    Active Member of [employer] or Organizations: No    Attends Archivist Meetings: Never    Marital Status: Widowed  Intimate Partner Violence: Not At Risk (05/24/2022)   Humiliation, Afraid, Rape, and Kick questionnaire    Fear of Current or Ex-Partner: No    Emotionally Abused: No    Physically Abused: No    Sexually Abused: No     Allergies  Allergen Reactions   Codeine Nausea And Vomiting and Nausea Only   Hydroxychloroquine Rash     Outpatient Medications Prior to Visit  Medication Sig Dispense Refill   estradiol (ESTRACE) 0.1 MG/GM vaginal cream Place 1 Applicatorful vaginally 3 (three) times a week.     gabapentin (NEURONTIN) 300 MG capsule Take 1 capsule (300 mg total) by mouth daily. 30 capsule 1   metolazone (ZAROXOLYN) 2.5 MG tablet Take 2.5 mg by mouth 3 (three) times a week.     Multiple Vitamin (MULTIVITAMIN) tablet Take 1 tablet by mouth daily with breakfast.     NEEDLE, DISP, 25 G (BD SAFETYGLIDE NEEDLE) 25G X 1" MISC 30 Needles by Does not apply route as directed. 100 each 0   Omega-3 Fatty Acids (FISH OIL PO) Take 1 capsule by mouth in the morning and at bedtime.     omeprazole (PRILOSEC) 40 MG capsule Take 40 mg by mouth daily before breakfast.     ondansetron (ZOFRAN) 4 MG tablet Take 4 mg by mouth 3 (three) times daily as needed for nausea or vomiting.     oxyCODONE (OXY IR/ROXICODONE) 5 MG immediate release tablet Take 1 tablet (5 mg total) by mouth every 4 (four) hours as needed for moderate pain. 20 tablet 0   potassium chloride (KLOR-CON) 20 MEQ packet Take 20 mEq by mouth daily.     Syringe, Disposable, 1 ML MISC 30 Syringes by Does not apply route as directed. 100 each 0   torsemide (DEMADEX) 20 MG tablet Take 2 tablets (40 mg total) by mouth 2 (two) times daily. 60 tablet 1   octreotide (SANDOSTATIN) 50 MCG/ML SOLN injection Inject 1 mL (50  mcg total) into the skin every 8 (eight) hours. (Patient not taking: Reported on 07/08/2022) 90 mL 0   No facility-administered medications prior to visit.       Objective:   Physical Exam:  General appearance: 61 y.o., female, NAD, conversant  Eyes: anicteric sclerae; PERRL, tracking appropriately HENT: NCAT; MMM Neck: Trachea midline; no lymphadenopathy, no JVD Lungs: CTAB, no crackles, no wheeze, with normal respiratory effort CV: RRR, no murmur  Abdomen: Soft, non-tender; non-distended, BS present  Extremities: No peripheral edema, warm Skin: Normal turgor and texture; no rash Psych: Appropriate affect Neuro: Alert and oriented to person and place, no focal deficit   [place] bilateral pleural space - simple appearing small effusions, mostly subpulmonic      Vitals:   07/08/22 1126  BP: 136/80  Pulse: 91  Temp: 98.4 F (36.9 C)  TempSrc: Oral  SpO2: 100%  Height: '5\' 1"'$  (1.549 m)    100% on RA BMI Readings from Last 3 Encounters:  07/08/22 27.81 kg/m  07/06/22 27.81 kg/m  06/28/22 28.08 kg/m   Wt Readings from Last 3 Encounters:  07/06/22 147 lb 3.2 oz (66.8 kg)  06/28/22 148 lb 9.6 oz (67.4 kg)  06/15/22 134 lb 8 oz (61 kg)     CBC    Component Value Date/Time   WBC 7.5 06/21/2022 0842   WBC 5.5 06/08/2022 1255   RBC 3.90 06/21/2022 0842   HGB 12.7 06/21/2022 0842   HGB 9.8 (L) 09/23/2019 1002   HCT 36.5 06/21/2022 0842   HCT 30.9 (L) 09/23/2019 1002   PLT 450 (H) 06/21/2022 0842   PLT 571 (H) 09/23/2019 1002   MCV 93.6 06/21/2022 0842   MCV 80 09/23/2019 1002   MCH 32.6 06/21/2022 0842   MCHC 34.8 06/21/2022 0842   RDW 13.1 06/21/2022 0842   RDW 16.7 (H) 09/23/2019 1002   LYMPHSABS 0.8 06/21/2022 0842   LYMPHSABS 1.7 09/23/2019 1002   MONOABS 0.5 06/21/2022 0842   EOSABS 0.1 06/21/2022 0842   EOSABS 0.2 09/23/2019 1002   BASOSABS 0.0 06/21/2022 0842   BASOSABS 0.1 09/23/2019 1002      Chest Imaging: CT Chest 06/02/22 with small R  pleural effusion, increase in L effusion no adenopathy  CXR 12/13 with increased size of right effusion  Pulmonary Functions Testing Results:     No data to display           Echocardiogram 03/15/22:   1. Left ventricular ejection fraction, by estimation, is 60 to 65%. The  left ventricle has normal function. The left ventricle has no regional  wall motion abnormalities. Left ventricular diastolic parameters were  normal.   2. Right ventricular systolic function is normal. The right ventricular  size is normal. There is normal pulmonary artery systolic pressure. The  estimated right ventricular systolic pressure is 56.3 mmHg.   3. The mitral valve is normal in structure. Mild mitral valve  regurgitation. No evidence of mitral stenosis.   4. The aortic valve is normal in structure. Aortic valve regurgitation is  not visualized. No aortic stenosis is present.   5. The inferior vena cava is normal in size with greater than 50%  respiratory variability, suggesting right atrial pressure of 3 mmHg.   6. A small pericardial effusion is present. The pericardial effusion is  anterior to the right ventricle and localized with largest diameter near  the right atrium with compression of the right atrium. Moderate pleural  effusion in the left lateral region.   There is < 25% respirophasic change in mitral valve inflow velocities.  Normal IVC diameter. Consdier chest CT for further assessment of Right  atrial compression. LIkely pericardial effusion but need to rule out other  etiologies.       Assessment & Plan:   # Recurrent bilateral chylothorax # Lymphedema Unclear etiology, potentially related to RA though there's little to no evidence linking RA to chylothorax (cholesterol effusion more commonly). For now efforts  at low fat diet and torsemide/metolazone are keeping fluid at bay sufficiently to not warrant thoracentesis.   Plan: - referral to lymphedema clinic at [facility], may need  lymphatic imaging/thoracic duct embolization at some point - dietary precautions reinforced. If needs another thora before next clinic visit despite effort at limiting fat intake then will see if we can prescribe octreotide    Maryjane Hurter, MD [facility] 07/08/2022 12:30 PM

## 2022-07-08 ENCOUNTER — Ambulatory Visit: Payer: BC Managed Care – PPO | Admitting: Student

## 2022-07-08 VITALS — BP 136/80 | HR 91 | Temp 98.4°F | Ht 61.0 in

## 2022-07-08 DIAGNOSIS — J940 Chylous effusion: Secondary | ICD-10-CM | POA: Diagnosis not present

## 2022-07-08 LAB — ACID FAST CULTURE WITH REFLEXED SENSITIVITIES (MYCOBACTERIA): Acid Fast Culture: NEGATIVE

## 2022-07-08 NOTE — Patient Instructions (Signed)
-   try implementing dietary measures from [facility], stick with torsemide and metolazone per Dr. Candiss Norse  - if you have worsening trouble breathing particularly when you lie down despite above measures then call [phone] to set up thoracentesis and we will try to prescribe you octreotide aka sandostatin - see you in 3 months or sooner if need be!

## 2022-07-14 DIAGNOSIS — J940 Chylous effusion: Secondary | ICD-10-CM | POA: Diagnosis not present

## 2022-07-14 DIAGNOSIS — R601 Generalized edema: Secondary | ICD-10-CM | POA: Diagnosis not present

## 2022-07-14 DIAGNOSIS — N179 Acute kidney failure, unspecified: Secondary | ICD-10-CM | POA: Diagnosis not present

## 2022-07-14 DIAGNOSIS — I129 Hypertensive chronic kidney disease with stage 1 through stage 4 chronic kidney disease, or unspecified chronic kidney disease: Secondary | ICD-10-CM | POA: Diagnosis not present

## 2022-08-04 ENCOUNTER — Telehealth: Payer: Self-pay | Admitting: Student

## 2022-08-04 NOTE — Telephone Encounter (Signed)
Faxing order to [phone] I called and got a new fax number

## 2022-08-04 NOTE — Telephone Encounter (Signed)
PT calling saying [facility] does not have the referral Dr. Verlee Monte sent over. Getting frustrated   because this was done in Dec. Please call to advise.

## 2022-08-04 NOTE — Telephone Encounter (Signed)
Referral was placed on 07/06/22 for pt to be referred to [facility].  Routing to [employer] as high priority to check on this for pt.

## 2022-08-05 NOTE — Telephone Encounter (Signed)
Pt called wanting to know if we have a phone number to the office that she has been referred to by  Dr. Verlee Monte as she is wanting to try to get a head start in regards to getting an appointment scheduled.  Alyssa Rhodes, please advise if you have this phone number and what office/practice she has been referred to.

## 2022-08-05 NOTE — Telephone Encounter (Signed)
Fax confirmation received.

## 2022-08-23 ENCOUNTER — Telehealth: Payer: Self-pay | Admitting: Student

## 2022-08-23 NOTE — Telephone Encounter (Signed)
Made appt w/Ms. Volanda Napoleon 2/8. Need Triage to know she has a history of fluid build up in her lungs Perhaps this is more serious and may need to be seen sooner. Please call pt to advise @ [phone]

## 2022-08-24 ENCOUNTER — Ambulatory Visit: Payer: BC Managed Care – PPO | Admitting: Primary Care

## 2022-08-24 NOTE — Telephone Encounter (Signed)
She can come if for CXR prior

## 2022-08-24 NOTE — Telephone Encounter (Signed)
Error

## 2022-08-24 NOTE — Telephone Encounter (Signed)
Routing to [employer] as an Pharmacist, hospital.

## 2022-08-31 NOTE — Progress Notes (Unsigned)
$'@Patient'u$  ID: Alyssa Rhodes, female    DOB: 03/17/1961, 62 y.o.   MRN: [id number]  No chief complaint on file.   Referring provider: Doretha Sou*  HPI: 62 year old female, former smoker.  Past medical history significant for hypertension, pericardial effusion, bilateral pleural effusion, chylothorax, type 2 diabetes, rheumatoid arthritis.  Patient of Dr. Verlee Monte, last seen in office on 07/08/2022.  Previous [facility] pulmonary encounter: 07/08/22- Dr. Verlee Monte  # Recurrent bilateral chylothorax # Lymphedema Unclear etiology, potentially related to RA though there's little to no evidence linking RA to chylothorax (cholesterol effusion more commonly). For now efforts at low fat diet and torsemide/metolazone are keeping fluid at bay sufficiently to not warrant thoracentesis.    Plan: - referral to l[facility]hedema clinic at [facility], may need lymphatic imaging/thoracic duct embolization at some point - dietary precautions reinforced. If needs another thora before next clinic visit despite effort at limiting fat intake then will see if we can prescribe octreotide  09/01/2022- Interim hx Patient presents today for acute overview.  She feels she has fluid in her lung.            Allergies  Allergen Reactions   Codeine Nausea And Vomiting and Nausea Only   Hydroxychloroquine Rash    Immunization History  Administered Date(s) Administered   Influenza,inj,Quad PF,6+ Mos 04/28/2016, 03/22/2019, 06/30/2021   Influenza-Unspecified 06/11/2020   PFIZER(Purple Top)SARS-COV-2 Vaccination 12/01/2019, 12/22/2019, 07/04/2020   Tdap 09/03/2015    Past Medical History:  Diagnosis Date   Diabetes mellitus without complication ([place])    Hyperlipidemia    Hypertension    Lupus ([place])    Thyroid nodule 03/2010   aspiration    Tobacco History: Social History   Tobacco Use  Smoking Status Former   Packs/day: 1.00   Years: 41.00   Total pack years: 41.00   Types: Cigarettes   Quit date:  08/12/2021   Years since quitting: 1.0  Smokeless Tobacco Never   Counseling given: Not Answered   Outpatient Medications Prior to Visit  Medication Sig Dispense Refill   estradiol (ESTRACE) 0.1 MG/GM vaginal cream Place 1 Applicatorful vaginally 3 (three) times a week.     gabapentin (NEURONTIN) 300 MG capsule Take 1 capsule (300 mg total) by mouth daily. 30 capsule 1   metolazone (ZAROXOLYN) 2.5 MG tablet Take 2.5 mg by mouth 3 (three) times a week.     Multiple Vitamin (MULTIVITAMIN) tablet Take 1 tablet by mouth daily with breakfast.     NEEDLE, DISP, 25 G (BD SAFETYGLIDE NEEDLE) 25G X 1" MISC 30 Needles by Does not apply route as directed. 100 each 0   Omega-3 Fatty Acids (FISH OIL PO) Take 1 capsule by mouth in the morning and at bedtime.     omeprazole (PRILOSEC) 40 MG capsule Take 40 mg by mouth daily before breakfast.     ondansetron (ZOFRAN) 4 MG tablet Take 4 mg by mouth 3 (three) times daily as needed for nausea or vomiting.     oxyCODONE (OXY IR/ROXICODONE) 5 MG immediate release tablet Take 1 tablet (5 mg total) by mouth every 4 (four) hours as needed for moderate pain. 20 tablet 0   potassium chloride (KLOR-CON) 20 MEQ packet Take 20 mEq by mouth daily.     Syringe, Disposable, 1 ML MISC 30 Syringes by Does not apply route as directed. 100 each 0   torsemide (DEMADEX) 20 MG tablet Take 2 tablets (40 mg total) by mouth 2 (two) times daily. 60 tablet 1   No  facility-administered medications prior to visit.      Review of Systems  Review of Systems   Physical Exam  There were no vitals taken for this visit. Physical Exam   Lab Results:  CBC    Component Value Date/Time   WBC 7.5 06/21/2022 0842   WBC 5.5 06/08/2022 1255   RBC 3.90 06/21/2022 0842   HGB 12.7 06/21/2022 0842   HGB 9.8 (L) 09/23/2019 1002   HCT 36.5 06/21/2022 0842   HCT 30.9 (L) 09/23/2019 1002   PLT 450 (H) 06/21/2022 0842   PLT 571 (H) 09/23/2019 1002   MCV 93.6 06/21/2022 0842   MCV 80  09/23/2019 1002   MCH 32.6 06/21/2022 0842   MCHC 34.8 06/21/2022 0842   RDW 13.1 06/21/2022 0842   RDW 16.7 (H) 09/23/2019 1002   LYMPHSABS 0.8 06/21/2022 0842   LYMPHSABS 1.7 09/23/2019 1002   MONOABS 0.5 06/21/2022 0842   EOSABS 0.1 06/21/2022 0842   EOSABS 0.2 09/23/2019 1002   BASOSABS 0.0 06/21/2022 0842   BASOSABS 0.1 09/23/2019 1002    BMET    Component Value Date/Time   NA 134 (L) 06/15/2022 1501   NA 135 02/04/2022 0822   K 4.2 06/15/2022 1501   CL 92 (L) 06/15/2022 1501   CO2 36 (H) 06/15/2022 1501   GLUCOSE 138 (H) 06/15/2022 1501   BUN 39 (H) 06/15/2022 1501   BUN 13 02/04/2022 0822   CREATININE 1.15 (H) 06/15/2022 1501   CREATININE 0.87 04/28/2016 1604   CALCIUM 9.8 06/15/2022 1501   GFRNONAA 54 (L) 06/15/2022 1501   GFRNONAA >89 08/11/2015 1343   GFRAA >60 12/17/2019 1336   GFRAA >89 08/11/2015 1343    BNP    Component Value Date/Time   BNP 82.6 05/23/2022 1312    ProBNP No results found for: "PROBNP"  Imaging: No results found.   Assessment & Plan:   No problem-specific Assessment & Plan notes found for this encounter.     Martyn Ehrich, NP 08/31/2022

## 2022-09-01 ENCOUNTER — Ambulatory Visit: Payer: BC Managed Care – PPO | Admitting: Primary Care

## 2022-09-01 ENCOUNTER — Other Ambulatory Visit: Payer: Self-pay

## 2022-09-01 ENCOUNTER — Encounter: Payer: Self-pay | Admitting: Primary Care

## 2022-09-01 ENCOUNTER — Ambulatory Visit (INDEPENDENT_AMBULATORY_CARE_PROVIDER_SITE_OTHER): Payer: BC Managed Care – PPO

## 2022-09-01 VITALS — BP 134/80 | HR 86 | Ht 61.0 in | Wt 149.0 lb

## 2022-09-01 DIAGNOSIS — J90 Pleural effusion, not elsewhere classified: Secondary | ICD-10-CM | POA: Diagnosis not present

## 2022-09-01 DIAGNOSIS — I890 Lymphedema, not elsewhere classified: Secondary | ICD-10-CM | POA: Diagnosis not present

## 2022-09-01 DIAGNOSIS — J940 Chylous effusion: Secondary | ICD-10-CM | POA: Diagnosis not present

## 2022-09-01 NOTE — Patient Instructions (Addendum)
-   Chest x-ray today shows stable bilateral pleural effusions. You have no oxygen requirements  - Likely we will not change current plan, continue to follow-up with [facility] for planned lymph angiogram  - Please reach out to Dr. Candiss Norse with nephrology to let him know about weight gain, he potentially may want to adjust your diuretics  Follow-up - March with Dr. Verlee Monte

## 2022-09-01 NOTE — Assessment & Plan Note (Signed)
-   Following with [facility] lymphedema clinic. Planning for IR lymphangiogram unilaterally and referred to vascular surgery

## 2022-09-01 NOTE — Assessment & Plan Note (Addendum)
-   Hx bilateral pleural effusion, chylothorax. Patient presents today with symptoms of generalized swelling, weight gain and increased shortness of breath.  She has no oxygen requirements and breathing is unlabored at rest. Chest x-ray today shows similar appearing small-moderate bilateral pleural effusions. Patient saw [facility] IR, plans for lymphangiogram and she was referred to vascular surgery. She is on torsemide 60 mg every morning and 40 mg every afternoon-managed by Dr. Earl Gala with nephrology. I will discuss case with Dr. Verlee Monte, we will likely not change plan. Advised she reach out to Dr. Earl Gala about weight gain.

## 2022-10-02 NOTE — Progress Notes (Unsigned)
Synopsis: Referred for recurrent chylothorax by Doretha Sou*  Subjective:   PATIENT ID: Alyssa Rhodes GENDER: female DOB: August 25, 1960, MRN: [id number]  No chief complaint on file.  Alyssa Rhodes is a 62 y.o. female who has a PMH as below including but not limited to RA (was on MTX but recently taken off by rheumatology/Dr. Amil Amen on 10/24), DM, HTN, HLD seen in clinic for recurrent chylothorax of unclear etiology   Had BMBx since discharge from hospital which showed no evidence of malignancy  SHe overall feels tired, swollen. She has DOE with light activity. This has been pretty stable since discharge. She has no cough. No CP. She has orthopnea, sleeps in recliner.   She was taken off of mtx in advance of consult with [facility] for biliary dyskinesia. Lap chole offered after pre-op risk stratification.   No family history of lung disease  She is an Optometrist. She smoked for 44 years half ppd, quit 2023.   Interval HPI: Here for right thora today.  Hasn't had appointment scheduled yet with [facility].   No change in DOE with light activity. Still trouble with orthopnea.  ------------------------------------------------------------------------------------------ Seen in [facility] 09/05/22 at which point had plan for TDE then managing remainder of lymphedema with compression/wraps  Otherwise pertinent review of systems is negative.  Past Medical History:  Diagnosis Date   Diabetes mellitus without complication ([place])    Hyperlipidemia    Hypertension    Lupus ([place])    Thyroid nodule 03/2010   aspiration     Family History  Problem Relation Age of Onset   Asthma Mother        doe not know mother well, sister advised her   Diabetes Mother    Heart disease Father        heart attack   Hypertension Father      Past Surgical History:  Procedure Laterality Date   ABDOMINAL HYSTERECTOMY     CESAREAN SECTION     ESOPHAGOGASTRODUODENOSCOPY (EGD)  WITH PROPOFOL N/A 04/08/2022   Procedure: ESOPHAGOGASTRODUODENOSCOPY (EGD) WITH PROPOFOL;  Surgeon: Clarene Essex, MD;  Location: [facility];  Service: Gastroenterology;  Laterality: N/A;   SHOULDER SURGERY  2011   LEFT    Social History   Socioeconomic History   Marital status: Widowed    Spouse name: Not on file   Number of children: 2   Years of education: Not on file   Highest education level: Not on file  Occupational History   Occupation: accountant  Tobacco Use   Smoking status: Former    Packs/day: 1.00    Years: 41.00    Total pack years: 41.00    Types: Cigarettes    Quit date: 08/12/2021    Years since quitting: 1.1   Smokeless tobacco: Never  Vaping Use   Vaping Use: Never used  Substance and Sexual Activity   Alcohol use: Not Currently    Alcohol/week: 3.0 standard drinks of alcohol    Types: 3 Standard drinks or equivalent per week    Comment: 3/week   Drug use: No   Sexual activity: Not Currently    Partners: Male  Other Topics Concern   Not on file  Social History Narrative   Not on file   Social Determinants of Health   Financial Resource Strain: Low Risk  (11/19/2018)   Overall Financial Resource Strain (CARDIA)    Difficulty of Paying Living Expenses: Not hard at all  Food Insecurity: No Food Insecurity (  05/24/2022)   Hunger Vital Sign    Worried About Running Out of Food in the Last Year: Never true    [place] in the Last Year: Never true  Transportation Needs: No Transportation Needs (05/24/2022)   PRAPARE - Hydrologist (Medical): No    Lack of Transportation (Non-Medical): No  Physical Activity: Inactive (11/19/2018)   Exercise Vital Sign    Days of Exercise per Week: 0 days    Minutes of Exercise per Session: 0 min  Stress: No Stress Concern Present (11/19/2018)   Bartholomew    Feeling of Stress : Only a little  Social Connections:  Moderately Isolated (11/19/2018)   Social Connection and Isolation Panel [NHANES]    Frequency of Communication with Friends and Family: Never    Frequency of Social Gatherings with Friends and Family: Never    Attends Religious Services: More than 4 times per year    Active Member of [employer] or Organizations: No    Attends Archivist Meetings: Never    Marital Status: Widowed  Intimate Partner Violence: Not At Risk (05/24/2022)   Humiliation, Afraid, Rape, and Kick questionnaire    Fear of Current or Ex-Partner: No    Emotionally Abused: No    Physically Abused: No    Sexually Abused: No     Allergies  Allergen Reactions   Codeine Nausea And Vomiting and Nausea Only   Hydroxychloroquine Rash     Outpatient Medications Prior to Visit  Medication Sig Dispense Refill   estradiol (ESTRACE) 0.1 MG/GM vaginal cream Place 1 Applicatorful vaginally 3 (three) times a week.     gabapentin (NEURONTIN) 300 MG capsule Take 1 capsule (300 mg total) by mouth daily. 30 capsule 1   metolazone (ZAROXOLYN) 2.5 MG tablet Take 2.5 mg by mouth 3 (three) times a week.     Multiple Vitamin (MULTIVITAMIN) tablet Take 1 tablet by mouth daily with breakfast.     NEEDLE, DISP, 25 G (BD SAFETYGLIDE NEEDLE) 25G X 1" MISC 30 Needles by Does not apply route as directed. 100 each 0   Omega-3 Fatty Acids (FISH OIL PO) Take 1 capsule by mouth in the morning and at bedtime.     omeprazole (PRILOSEC) 40 MG capsule Take 40 mg by mouth daily before breakfast.     ondansetron (ZOFRAN) 4 MG tablet Take 4 mg by mouth 3 (three) times daily as needed for nausea or vomiting.     oxyCODONE (OXY IR/ROXICODONE) 5 MG immediate release tablet Take 1 tablet (5 mg total) by mouth every 4 (four) hours as needed for moderate pain. 20 tablet 0   potassium chloride (KLOR-CON) 20 MEQ packet Take 20 mEq by mouth daily.     Syringe, Disposable, 1 ML MISC 30 Syringes by Does not apply route as directed. 100 each 0   torsemide  (DEMADEX) 20 MG tablet Take 2 tablets (40 mg total) by mouth 2 (two) times daily. 60 tablet 1   No facility-administered medications prior to visit.       Objective:   Physical Exam:  General appearance: 62 y.o., female, NAD, conversant  Eyes: anicteric sclerae; PERRL, tracking appropriately HENT: NCAT; MMM Neck: Trachea midline; no lymphadenopathy, no JVD Lungs: CTAB, no crackles, no wheeze, with normal respiratory effort CV: RRR, no murmur  Abdomen: Soft, non-tender; non-distended, BS present  Extremities: No peripheral edema, warm Skin: Normal turgor and texture; no rash Psych:  Appropriate affect Neuro: Alert and oriented to person and place, no focal deficit   [place] bilateral pleural space - simple appearing small effusions, mostly subpulmonic      There were no vitals filed for this visit.     on RA BMI Readings from Last 3 Encounters:  09/01/22 28.15 kg/m  07/08/22 27.81 kg/m  07/06/22 27.81 kg/m   Wt Readings from Last 3 Encounters:  09/01/22 149 lb (67.6 kg)  07/06/22 147 lb 3.2 oz (66.8 kg)  06/28/22 148 lb 9.6 oz (67.4 kg)     CBC    Component Value Date/Time   WBC 7.5 06/21/2022 0842   WBC 5.5 06/08/2022 1255   RBC 3.90 06/21/2022 0842   HGB 12.7 06/21/2022 0842   HGB 9.8 (L) 09/23/2019 1002   HCT 36.5 06/21/2022 0842   HCT 30.9 (L) 09/23/2019 1002   PLT 450 (H) 06/21/2022 0842   PLT 571 (H) 09/23/2019 1002   MCV 93.6 06/21/2022 0842   MCV 80 09/23/2019 1002   MCH 32.6 06/21/2022 0842   MCHC 34.8 06/21/2022 0842   RDW 13.1 06/21/2022 0842   RDW 16.7 (H) 09/23/2019 1002   LYMPHSABS 0.8 06/21/2022 0842   LYMPHSABS 1.7 09/23/2019 1002   MONOABS 0.5 06/21/2022 0842   EOSABS 0.1 06/21/2022 0842   EOSABS 0.2 09/23/2019 1002   BASOSABS 0.0 06/21/2022 0842   BASOSABS 0.1 09/23/2019 1002      Chest Imaging: CT Chest 06/02/22 with small R pleural effusion, increase in L effusion no adenopathy  CXR 12/13 with increased size of right  effusion  CXR 09/01/22 stable effusions  Pulmonary Functions Testing Results:     No data to display           Echocardiogram 03/15/22:   1. Left ventricular ejection fraction, by estimation, is 60 to 65%. The  left ventricle has normal function. The left ventricle has no regional  wall motion abnormalities. Left ventricular diastolic parameters were  normal.   2. Right ventricular systolic function is normal. The right ventricular  size is normal. There is normal pulmonary artery systolic pressure. The  estimated right ventricular systolic pressure is 0000000 mmHg.   3. The mitral valve is normal in structure. Mild mitral valve  regurgitation. No evidence of mitral stenosis.   4. The aortic valve is normal in structure. Aortic valve regurgitation is  not visualized. No aortic stenosis is present.   5. The inferior vena cava is normal in size with greater than 50%  respiratory variability, suggesting right atrial pressure of 3 mmHg.   6. A small pericardial effusion is present. The pericardial effusion is  anterior to the right ventricle and localized with largest diameter near  the right atrium with compression of the right atrium. Moderate pleural  effusion in the left lateral region.   There is < 25% respirophasic change in mitral valve inflow velocities.  Normal IVC diameter. Consdier chest CT for further assessment of Right  atrial compression. LIkely pericardial effusion but need to rule out other  etiologies.   TTE 09/21/22:   NORMAL LEFT VENTRICULAR SYSTOLIC FUNCTION    NORMAL LA PRESSURES WITH NORMAL DIASTOLIC FUNCTION    NORMAL RIGHT VENTRICULAR SYSTOLIC FUNCTION    VALVULAR REGURGITATION: MILD MR, TRIVIAL TR    NO VALVULAR STENOSIS    LATE POSITIVE SALINE MICROCAVITATIONS    NO PRIOR STUDY FOR COMPARISON       Assessment & Plan:   # Recurrent bilateral chylothorax # Lymphedema Unclear etiology,  potentially related to RA though there's little to no evidence  linking RA to chylothorax (cholesterol effusion more commonly). For now efforts at low fat diet and torsemide/metolazone are keeping fluid at bay sufficiently to not warrant thoracentesis.   Plan: - referral to l[facility]hedema clinic at [facility], may need lymphatic imaging/thoracic duct embolization at some point - dietary precautions reinforced. If needs another thora before next clinic visit despite effort at limiting fat intake then will see if we can prescribe octreotide    Maryjane Hurter, MD [facility] 10/02/2022 2:29 PM

## 2022-10-05 ENCOUNTER — Encounter: Payer: Self-pay | Admitting: Student

## 2022-10-05 ENCOUNTER — Ambulatory Visit: Payer: BC Managed Care – PPO | Admitting: Student

## 2022-10-05 VITALS — BP 112/74 | HR 79 | Temp 97.9°F | Ht 61.0 in | Wt 138.8 lb

## 2022-10-05 DIAGNOSIS — J940 Chylous effusion: Secondary | ICD-10-CM | POA: Diagnosis not present

## 2022-10-05 DIAGNOSIS — R0609 Other forms of dyspnea: Secondary | ICD-10-CM | POA: Diagnosis not present

## 2022-10-05 NOTE — Addendum Note (Signed)
Addended by: Suzzanne Cloud E on: 10/05/2022 09:42 AM   Modules accepted: Orders

## 2022-10-05 NOTE — Patient Instructions (Addendum)
-   try stiolto 2 puffs daily - PFT next visit - think about lung cancer screening, can order at next visit if you wish

## 2022-10-21 LAB — OTHER LAB TEST: PDF: 0

## 2022-12-21 ENCOUNTER — Ambulatory Visit: Payer: BC Managed Care – PPO | Admitting: Student

## 2022-12-23 ENCOUNTER — Ambulatory Visit (INDEPENDENT_AMBULATORY_CARE_PROVIDER_SITE_OTHER): Payer: BC Managed Care – PPO | Admitting: Student

## 2022-12-23 DIAGNOSIS — R0609 Other forms of dyspnea: Secondary | ICD-10-CM | POA: Diagnosis not present

## 2022-12-23 LAB — PULMONARY FUNCTION TEST
DL/VA % pred: 63 %
DL/VA: 2.72 ml/min/mmHg/L
DLCO cor % pred: 68 %
DLCO cor: 12.47 ml/min/mmHg
DLCO unc % pred: 68 %
DLCO unc: 12.47 ml/min/mmHg
FEF 25-75 Post: 1.06 L/sec
FEF 25-75 Pre: 0.84 L/sec
FEF2575-%Change-Post: 26 %
FEF2575-%Pred-Post: 49 %
FEF2575-%Pred-Pre: 39 %
FEV1-%Change-Post: 11 %
FEV1-%Pred-Post: 63 %
FEV1-%Pred-Pre: 56 %
FEV1-Post: 1.42 L
FEV1-Pre: 1.27 L
FEV1FVC-%Change-Post: 8 %
FEV1FVC-%Pred-Pre: 55 %
FEV6-%Change-Post: 0 %
FEV6-%Pred-Post: 102 %
FEV6-%Pred-Pre: 102 %
FEV6-Post: 2.89 L
FEV6-Pre: 2.87 L
FEV6FVC-%Change-Post: 0 %
FEV6FVC-%Pred-Post: 102 %
FEV6FVC-%Pred-Pre: 102 %
FVC-%Change-Post: 2 %
FVC-%Pred-Post: 102 %
FVC-%Pred-Pre: 99 %
FVC-Post: 2.99 L
FVC-Pre: 2.91 L
Post FEV1/FVC ratio: 47 %
Post FEV6/FVC ratio: 99 %
Pre FEV1/FVC ratio: 44 %
Pre FEV6/FVC Ratio: 99 %
RV % pred: 116 %
RV: 2.16 L
TLC % pred: 107 %
TLC: 4.96 L

## 2022-12-23 NOTE — Progress Notes (Signed)
Full PFT completed today ? ?

## 2023-01-03 NOTE — Progress Notes (Signed)
Synopsis: Referred for recurrent chylothorax by Jonna Clark*  Subjective:   PATIENT ID: Alyssa Rhodes GENDER: female DOB: 07-03-1961, MRN: [id number]  Chief Complaint  Patient presents with   Follow-up    Breathing is unchanged. She stopped Stiolto after samples were done.    Alyssa Rhodes is a 62 y.o. female who has a PMH as below including but not limited to RA (was on MTX but recently taken off by rheumatology/Dr. Dierdre Forth on 10/24), DM, HTN, HLD seen in clinic for recurrent chylothorax of unclear etiology   Had BMBx since discharge from hospital which showed no evidence of malignancy  SHe overall feels tired, swollen. She has DOE with light activity. This has been pretty stable since discharge. She has no cough. No CP. She has orthopnea, sleeps in recliner.   She was taken off of mtx in advance of consult with [facility] for biliary dyskinesia. Lap chole offered after pre-op risk stratification.   No family history of lung disease  She is an Airline pilot. She smoked for 44 years half ppd, quit 2023.   Interval HPI:  Started on stiolto  Had been planning TDE [facility] - looks like after lymphangiogram decision made instead to focus on conservative measures . She did get pneumatic compression device.   VEGF-D sent last visit   PFT 12/23/22 with moderate obstruction  No trouble breathing unless she's bending over to get in tub, etc. She has no bothersome cough. Stable orthopnea.     Otherwise pertinent review of systems is negative.  Past Medical History:  Diagnosis Date   Diabetes mellitus without complication ([facility])    Hyperlipidemia    Hypertension    Lupus ([facility])    Thyroid nodule 03/2010   aspiration     Family History  Problem Relation Age of Onset   Asthma Mother        doe not know mother well, sister advised her   Diabetes Mother    Heart disease Father        heart attack   Hypertension Father      Past Surgical History:  Procedure Laterality Date    ABDOMINAL HYSTERECTOMY     CESAREAN SECTION     ESOPHAGOGASTRODUODENOSCOPY (EGD) WITH PROPOFOL N/A 04/08/2022   Procedure: ESOPHAGOGASTRODUODENOSCOPY (EGD) WITH PROPOFOL;  Surgeon: Vida Rigger, MD;  Location: [facility];  Service: Gastroenterology;  Laterality: N/A;   SHOULDER SURGERY  2011   LEFT    Social History   Socioeconomic History   Marital status: Widowed    Spouse name: Not on file   Number of children: 2   Years of education: Not on file   Highest education level: Not on file  Occupational History   Occupation: accountant  Tobacco Use   Smoking status: Former    Packs/day: 1.00    Years: 41.00    Additional pack years: 0.00    Total pack years: 41.00    Types: Cigarettes    Quit date: 08/12/2021    Years since quitting: 1.4   Smokeless tobacco: Never  Vaping Use   Vaping Use: Never used  Substance and Sexual Activity   Alcohol use: Not Currently    Alcohol/week: 3.0 standard drinks of alcohol    Types: 3 Standard drinks or equivalent per week    Comment: 3/week   Drug use: No   Sexual activity: Not Currently    Partners: Male  Other Topics Concern   Not on file  Social History Narrative  Not on file   Social Determinants of Health   Financial Resource Strain: Low Risk  (11/19/2018)   Overall Financial Resource Strain (CARDIA)    Difficulty of Paying Living Expenses: Not hard at all  Food Insecurity: No Food Insecurity (05/24/2022)   Hunger Vital Sign    Worried About Running Out of Food in the Last Year: Never true    Ran Out of Food in the Last Year: Never true  Transportation Needs: No Transportation Needs (05/24/2022)   PRAPARE - Administrator, Civil Service (Medical): No    Lack of Transportation (Non-Medical): No  Physical Activity: Inactive (11/19/2018)   Exercise Vital Sign    Days of Exercise per Week: 0 days    Minutes of Exercise per Session: 0 min  Stress: No Stress Concern Present (11/19/2018)   [employer] of  Occupational Health - Occupational Stress Questionnaire    Feeling of Stress : Only a little  Social Connections: Moderately Isolated (11/19/2018)   Social Connection and Isolation Panel [NHANES]    Frequency of Communication with Friends and Family: Never    Frequency of Social Gatherings with Friends and Family: Never    Attends Religious Services: More than 4 times per year    Active Member of [employer] or Organizations: No    Attends Banker Meetings: Never    Marital Status: Widowed  Intimate Partner Violence: Not At Risk (05/24/2022)   Humiliation, Afraid, Rape, and Kick questionnaire    Fear of Current or Ex-Partner: No    Emotionally Abused: No    Physically Abused: No    Sexually Abused: No     Allergies  Allergen Reactions   Codeine Nausea And Vomiting and Nausea Only   Hydroxychloroquine Rash     Outpatient Medications Prior to Visit  Medication Sig Dispense Refill   estradiol (ESTRACE) 0.1 MG/GM vaginal cream Place 1 Applicatorful vaginally 3 (three) times a week.     gabapentin (NEURONTIN) 300 MG capsule Take 1 capsule (300 mg total) by mouth daily. 30 capsule 1   metolazone (ZAROXOLYN) 2.5 MG tablet Take 2.5 mg by mouth 3 (three) times a week.     Multiple Vitamin (MULTIVITAMIN) tablet Take 1 tablet by mouth daily with breakfast.     Omega-3 Fatty Acids (FISH OIL PO) Take 1 capsule by mouth in the morning and at bedtime.     oxyCODONE (OXY IR/ROXICODONE) 5 MG immediate release tablet Take 1 tablet (5 mg total) by mouth every 4 (four) hours as needed for moderate pain. 20 tablet 0   potassium chloride (KLOR-CON) 20 MEQ packet Take 20 mEq by mouth daily.     torsemide (DEMADEX) 20 MG tablet Take 2 tablets (40 mg total) by mouth 2 (two) times daily. (Patient taking differently: 3 every am and 2 every afternoon) 60 tablet 1   omeprazole (PRILOSEC) 40 MG capsule Take 40 mg by mouth daily before breakfast. (Patient not taking: Reported on 10/05/2022)     ondansetron  (ZOFRAN) 4 MG tablet Take 4 mg by mouth 3 (three) times daily as needed for nausea or vomiting. (Patient not taking: Reported on 10/05/2022)     No facility-administered medications prior to visit.       Objective:   Physical Exam:  General appearance: 62 y.o., female, NAD, conversant  Eyes: anicteric sclerae; PERRL, tracking appropriately HENT: NCAT; MMM Neck: Trachea midline; no lymphadenopathy, no JVD Lungs: CTAB, no crackles, no wheeze, with normal respiratory effort CV: RRR, no murmur  Abdomen: Soft, non-tender; non-distended, BS present  Extremities: No peripheral edema, warm Skin: Normal turgor and texture; no rash Psych: Appropriate affect Neuro: Alert and oriented to person and place, no focal deficit         Vitals:   01/06/23 0928  BP: 108/60  Pulse: 77  Temp: 98 F (36.7 C)  TempSrc: Oral  SpO2: 98%  Weight: 130 lb 9.6 oz (59.2 kg)  Height: 5\' 1"  (1.549 m)      98% on RA BMI Readings from Last 3 Encounters:  01/06/23 24.68 kg/m  10/05/22 26.23 kg/m  09/01/22 28.15 kg/m   Wt Readings from Last 3 Encounters:  01/06/23 130 lb 9.6 oz (59.2 kg)  10/05/22 138 lb 12.8 oz (63 kg)  09/01/22 149 lb (67.6 kg)     CBC    Component Value Date/Time   WBC 7.5 06/21/2022 0842   WBC 5.5 06/08/2022 1255   RBC 3.90 06/21/2022 0842   HGB 12.7 06/21/2022 0842   HGB 9.8 (L) 09/23/2019 1002   HCT 36.5 06/21/2022 0842   HCT 30.9 (L) 09/23/2019 1002   PLT 450 (H) 06/21/2022 0842   PLT 571 (H) 09/23/2019 1002   MCV 93.6 06/21/2022 0842   MCV 80 09/23/2019 1002   MCH 32.6 06/21/2022 0842   MCHC 34.8 06/21/2022 0842   RDW 13.1 06/21/2022 0842   RDW 16.7 (H) 09/23/2019 1002   LYMPHSABS 0.8 06/21/2022 0842   LYMPHSABS 1.7 09/23/2019 1002   MONOABS 0.5 06/21/2022 0842   EOSABS 0.1 06/21/2022 0842   EOSABS 0.2 09/23/2019 1002   BASOSABS 0.0 06/21/2022 0842   BASOSABS 0.1 09/23/2019 1002      Chest Imaging: CT Chest 06/02/22 with emphysema, small R  pleural effusion, increase in L effusion no adenopathy  CXR 12/13 with increased size of right effusion  CXR 09/01/22 stable effusions  Pulmonary Functions Testing Results:    Latest Ref Rng & Units 12/23/2022   10:59 AM  PFT Results  FVC-Pre L 2.91   FVC-Predicted Pre % 99   FVC-Post L 2.99   FVC-Predicted Post % 102   Pre FEV1/FVC % % 44   Post FEV1/FCV % % 47   FEV1-Pre L 1.27   FEV1-Predicted Pre % 56   FEV1-Post L 1.42   DLCO uncorrected ml/min/mmHg 12.47   DLCO UNC% % 68   DLCO corrected ml/min/mmHg 12.47   DLCO COR %Predicted % 68   DLVA Predicted % 63   TLC L 4.96   TLC % Predicted % 107   RV % Predicted % 116    PFT 12/23/22 with moderate obstruction  Echocardiogram 03/15/22:   1. Left ventricular ejection fraction, by estimation, is 60 to 65%. The  left ventricle has normal function. The left ventricle has no regional  wall motion abnormalities. Left ventricular diastolic parameters were  normal.   2. Right ventricular systolic function is normal. The right ventricular  size is normal. There is normal pulmonary artery systolic pressure. The  estimated right ventricular systolic pressure is 27.6 mmHg.   3. The mitral valve is normal in structure. Mild mitral valve  regurgitation. No evidence of mitral stenosis.   4. The aortic valve is normal in structure. Aortic valve regurgitation is  not visualized. No aortic stenosis is present.   5. The inferior vena cava is normal in size with greater than 50%  respiratory variability, suggesting right atrial pressure of 3 mmHg.   6. A small pericardial effusion is present. The pericardial  effusion is  anterior to the right ventricle and localized with largest diameter near  the right atrium with compression of the right atrium. Moderate pleural  effusion in the left lateral region.   There is < 25% respirophasic change in mitral valve inflow velocities.  Normal IVC diameter. Consdier chest CT for further assessment of  Right  atrial compression. LIkely pericardial effusion but need to rule out other  etiologies.   TTE 09/21/22:   NORMAL LEFT VENTRICULAR SYSTOLIC FUNCTION    NORMAL LA PRESSURES WITH NORMAL DIASTOLIC FUNCTION    NORMAL RIGHT VENTRICULAR SYSTOLIC FUNCTION    VALVULAR REGURGITATION: MILD MR, TRIVIAL TR    NO VALVULAR STENOSIS    LATE POSITIVE SALINE MICROCAVITATIONS    NO PRIOR STUDY FOR COMPARISON       Assessment & Plan:   # Recurrent bilateral chylothorax # Lymphedema Unclear etiology - ?LAM given emphysema (though suspect emphysema more likely due to smoking), potentially related to RA though there's little to no evidence linking RA to chylothorax (cholesterol effusion more commonly). For now efforts at low fat diet and torsemide/metolazone are keeping fluid at bay sufficiently to not warrant thoracentesis. Doesn't appear to be causing restriction on PFT.   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 symptomatically # History of smoking  Plan: - we will request VEGF[facility]D [facility] from [facility] - Reviewed her unique risks and benefits on [email or web address] with her, gave her print out and encouraged her to think about lung cancer screening, can call us if she wishes to schedule it - If worsening trouble breathing when you lie down despite use of your torsemide, metolazone call to make an appointment - If chest tightness, productive cough and rapid onset shortness of breath then call to make an appointment - can try albuterol 1-2 puffs as needed for chest tightness, cough, shortness of breath and can use 5-20 minutes before exercise as well  Return to care as needed   Omar Person, MD [facility] 01/06/2023 9:36 AM

## 2023-01-06 ENCOUNTER — Encounter: Payer: Self-pay | Admitting: Student

## 2023-01-06 ENCOUNTER — Ambulatory Visit (INDEPENDENT_AMBULATORY_CARE_PROVIDER_SITE_OTHER): Payer: BC Managed Care – PPO | Admitting: Student

## 2023-01-06 ENCOUNTER — Telehealth: Payer: Self-pay | Admitting: Student

## 2023-01-06 VITALS — BP 108/60 | HR 77 | Temp 98.0°F | Ht 61.0 in | Wt 130.6 lb

## 2023-01-06 DIAGNOSIS — Z87891 Personal history of nicotine dependence: Secondary | ICD-10-CM

## 2023-01-06 DIAGNOSIS — J449 Chronic obstructive pulmonary disease, unspecified: Secondary | ICD-10-CM | POA: Diagnosis not present

## 2023-01-06 DIAGNOSIS — J940 Chylous effusion: Secondary | ICD-10-CM | POA: Diagnosis not present

## 2023-01-06 MED ORDER — ALBUTEROL SULFATE HFA 108 (90 BASE) MCG/ACT IN AERS: 1.0000 | INHALATION_SPRAY | Freq: Four times a day (QID) | RESPIRATORY_TRACT | 11 refills | Status: AC | PRN

## 2023-01-06 NOTE — Telephone Encounter (Signed)
Pharmacy calling in having question about the Ventolin inhaler prescribed

## 2023-01-06 NOTE — Patient Instructions (Addendum)
-   If worsening trouble breathing when you lie down despite use of your torsemide, metolazone call to make an appointment - If chest tightness, productive cough and rapid onset shortness of breath then call to make an appointment - can try albuterol 1-2 puffs as needed for chest tightness, cough, shortness of breath and can use 5-20 minutes before exercise as well - let us know if you want to participate in lung cancer screening

## 2024-07-29 ENCOUNTER — Ambulatory Visit (INDEPENDENT_AMBULATORY_CARE_PROVIDER_SITE_OTHER)

## 2024-07-29 ENCOUNTER — Encounter (INDEPENDENT_AMBULATORY_CARE_PROVIDER_SITE_OTHER): Payer: Self-pay

## 2024-07-29 VITALS — Ht 61.0 in | Wt 117.0 lb

## 2024-07-29 DIAGNOSIS — R040 Epistaxis: Secondary | ICD-10-CM

## 2024-07-29 MED ORDER — OXYMETAZOLINE HCL 0.05 % NA SOLN: 1.0000 | Freq: Two times a day (BID) | NASAL | Status: AC

## 2024-07-29 NOTE — Progress Notes (Signed)
 Dear Dr. Melba, Here is my assessment for our mutual patient, Alyssa Rhodes. Thank you for allowing me the opportunity to care for your patient. Please do not hesitate to contact me should you have any other questions. Sincerely, Dr. Hadassah Parody  Otolaryngology Clinic Note Referring provider: Dr. Melba HPI:   Initial HPI (07/29/2024) 64 year old female who presents for evaluation of recurrent left-sided epistaxis.  Intermittent episodes of left-sided epistaxis initially occurring in the mornings however started to happen up to 3 times a day.  Due to the increased frequency of epistaxis this prompted her to seek evaluation.  All episodes localized in the left nasal cavity.  Each episode lasts about 5 minutes.  She does not do anything to stop the bleed just waits for it to stop.  She has not had any epistaxis in the last 2 weeks.  No humidifier at home.  No use of anticoagulants.   Independent Review of Additional Tests or Records:  Referral 07/02/2024 Lamarr Melba, NP: Patient with epistaxis, referred to ENT  CBC 08/30/2022 hemoglobin 15  PT/INR 08/30/2022: PT 10.8, INR 0.9 PMH/Meds/All/SocHx/FamHx/ROS:   Past Medical History:  Diagnosis Date   Diabetes mellitus without complication (HCC)    Hyperlipidemia    Hypertension    Lupus    Thyroid  nodule 03/2010   aspiration     Past Surgical History:  Procedure Laterality Date   ABDOMINAL HYSTERECTOMY     CESAREAN SECTION     ESOPHAGOGASTRODUODENOSCOPY (EGD) WITH PROPOFOL  N/A 04/08/2022   Procedure: ESOPHAGOGASTRODUODENOSCOPY (EGD) WITH PROPOFOL ;  Surgeon: Rosalie Kitchens, MD;  Location: [facility];  Service: Gastroenterology;  Laterality: N/A;   SHOULDER SURGERY  2011   LEFT    Family History  Problem Relation Age of Onset   Asthma Mother        doe not know mother well, sister advised her   Diabetes Mother    Heart disease Father        heart attack   Hypertension Father      Social Connections: Not on file     Current  Outpatient Medications  Medication Instructions   albuterol  (VENTOLIN  HFA) 108 (90 Base) MCG/ACT inhaler 1-2 puffs, Inhalation, Every 6 hours PRN   estradiol  (ESTRACE ) 0.1 MG/GM vaginal cream 1 Applicatorful, Vaginal, 3 times weekly   gabapentin  (NEURONTIN ) 300 mg, Oral, Daily   metolazone  (ZAROXOLYN ) 2.5 mg, 3 times weekly   Multiple Vitamin (MULTIVITAMIN) tablet 1 tablet, Daily with breakfast   Omega-3 Fatty Acids (FISH OIL PO) 1 capsule, 2 times daily   oxyCODONE  (OXY IR/ROXICODONE ) 5 mg, Oral, Every 4 hours PRN   oxymetazoline  (AFRIN NASAL SPRAY) 0.05 % nasal spray 1 spray, Each Nare, 2 times daily   potassium chloride  (KLOR-CON ) 20 MEQ packet 20 mEq, Daily   torsemide  (DEMADEX ) 40 mg, Oral, 2 times daily     Physical Exam:   Ht 5' 1 (1.549 m)   Wt 117 lb (53.1 kg)   BMI 22.11 kg/m   Salient findings:  CN II-XII intact   Bilateral EAC clear and TM intact with well pneumatized middle ear spaces  Anterior rhinoscopy: Septum midline anteriorly, no areas of obvious source for epistaxis anteriorly; bilateral inferior turbinates with hypertrophy Nasal endoscopy was indicated to better evaluate the nose and paranasal sinuses, given the patient's history and exam findings, and is detailed below.  No lesions of oral cavity/oropharynx  No obviously palpable neck masses/lymphadenopathy/thyromegaly  No respiratory distress or stridor  Seprately Identifiable Procedures:  Prior to initiating any procedures,  risks/benefits/alternatives were explained to the patient and verbal consent obtained.  PROCEDURE (07/29/2024): Bilateral Diagnostic Rigid Nasal Endoscopy Pre-procedure diagnosis: Concern for epistaxis Post-procedure diagnosis: same Indication: See pre-procedure diagnosis and physical exam above Complications: None apparent EBL: 0 mL Anesthesia: Lidocaine  4% and topical decongestant was topically sprayed in each nasal cavity  Description of Procedure:  Patient was identified. A  rigid 30 degree endoscope was utilized to evaluate the sinonasal cavities, mucosa, sinus ostia and turbinates and septum.  Overall, signs of mucosal inflammation are not noted.  Also noted are no areas of concern for site of recurrent epistaxis.  No mucopurulence, polyps, or masses noted.   Right Middle meatus: Clear Right SE Recess: Clear Left MM: Clear Left SE Recess: Clear Photodocumentation was obtained.  CPT CODE -- 68768 - Mod 25   Impression & Plans:  Alyssa Rhodes is a 64 y.o. female with   1. Recurrent epistaxis    Assessment and Plan Assessment & Plan Epistaxis recurrent She experienced recurrent left-sided epistaxis, now resolved, likely secondary to septal mucosal dryness exacerbated by environmental factors. Anterior and posterior nasal examination revealed no active bleeding or lesions. No evidence of ongoing pathology or need for acute intervention. Anticipated continued resolution with preventive measures. - Performed anterior and posterior nasal examination, which was unremarkable for active bleeding or lesions. - Advised nightly use of a humidifier to maintain nasal mucosal moisture and prevent recurrence.  Can use nasal saline gel as well. - Recommended keeping oxymetazoline  (Afrin) nasal spray available for acute episodes of epistaxis, with instructions to use two sprays and apply firm pressure to the soft part of the nose for five minutes if epistaxis occurs.  She should avoid using this for over 3 days at a time to avoid overuse of Afrin. - Counseled to avoid long-term or frequent use of oxymetazoline  due to risk of rebound congestion and dependency.     See below regarding exact medications prescribed this encounter including dosages and route: Meds ordered this encounter  Medications   oxymetazoline  (AFRIN NASAL SPRAY) 0.05 % nasal spray    Sig: Place 1 spray into both nostrils 2 (two) times daily.      Thank you for allowing me the opportunity to care for  your patient. Please do not hesitate to contact me should you have any other questions.  Sincerely, Hadassah Parody, MD Otolaryngologist (ENT), [facility] Phone: [phone] Fax: [phone]  MDM:  Level 4 Complexity/Problems addressed: 3-acute uncomplicated problem Data complexity: 4-  independent review of referral note, 2 lab - Morbidity: 4-drug manage - Prescription Drug prescribed or managed: yes
